# Patient Record
Sex: Female | Born: 1935 | Race: White | Hispanic: No | Smoking: Former smoker
Health system: Southern US, Community
[De-identification: ages and names within clinical notes are randomized; demographics above are authoritative.]

## PROBLEM LIST (undated history)

## (undated) DIAGNOSIS — L0390 Cellulitis, unspecified: Secondary | ICD-10-CM

## (undated) DIAGNOSIS — G245 Blepharospasm: Secondary | ICD-10-CM

## (undated) DIAGNOSIS — M1990 Unspecified osteoarthritis, unspecified site: Secondary | ICD-10-CM

## (undated) DIAGNOSIS — I771 Stricture of artery: Secondary | ICD-10-CM

## (undated) DIAGNOSIS — Z9889 Other specified postprocedural states: Secondary | ICD-10-CM

## (undated) DIAGNOSIS — D649 Anemia, unspecified: Secondary | ICD-10-CM

## (undated) DIAGNOSIS — I6529 Occlusion and stenosis of unspecified carotid artery: Secondary | ICD-10-CM

## (undated) DIAGNOSIS — K219 Gastro-esophageal reflux disease without esophagitis: Secondary | ICD-10-CM

## (undated) DIAGNOSIS — L97909 Non-pressure chronic ulcer of unspecified part of unspecified lower leg with unspecified severity: Secondary | ICD-10-CM

## (undated) DIAGNOSIS — J189 Pneumonia, unspecified organism: Secondary | ICD-10-CM

## (undated) DIAGNOSIS — I639 Cerebral infarction, unspecified: Secondary | ICD-10-CM

## (undated) DIAGNOSIS — F329 Major depressive disorder, single episode, unspecified: Secondary | ICD-10-CM

## (undated) DIAGNOSIS — I83009 Varicose veins of unspecified lower extremity with ulcer of unspecified site: Secondary | ICD-10-CM

## (undated) DIAGNOSIS — I10 Essential (primary) hypertension: Secondary | ICD-10-CM

## (undated) DIAGNOSIS — R112 Nausea with vomiting, unspecified: Secondary | ICD-10-CM

## (undated) DIAGNOSIS — I499 Cardiac arrhythmia, unspecified: Secondary | ICD-10-CM

## (undated) DIAGNOSIS — J9610 Chronic respiratory failure, unspecified whether with hypoxia or hypercapnia: Secondary | ICD-10-CM

## (undated) DIAGNOSIS — E538 Deficiency of other specified B group vitamins: Secondary | ICD-10-CM

## (undated) DIAGNOSIS — J449 Chronic obstructive pulmonary disease, unspecified: Secondary | ICD-10-CM

## (undated) DIAGNOSIS — H8110 Benign paroxysmal vertigo, unspecified ear: Secondary | ICD-10-CM

## (undated) DIAGNOSIS — R413 Other amnesia: Secondary | ICD-10-CM

## (undated) DIAGNOSIS — R51 Headache: Secondary | ICD-10-CM

## (undated) DIAGNOSIS — C801 Malignant (primary) neoplasm, unspecified: Secondary | ICD-10-CM

## (undated) DIAGNOSIS — C44721 Squamous cell carcinoma of skin of unspecified lower limb, including hip: Secondary | ICD-10-CM

## (undated) DIAGNOSIS — R0602 Shortness of breath: Secondary | ICD-10-CM

## (undated) HISTORY — DX: Blepharospasm: G24.5

## (undated) HISTORY — DX: Cellulitis, unspecified: L03.90

## (undated) HISTORY — PX: ABDOMINAL HYSTERECTOMY: SHX81

## (undated) HISTORY — PX: APPENDECTOMY: SHX54

## (undated) HISTORY — DX: Deficiency of other specified B group vitamins: E53.8

## (undated) HISTORY — PX: EYE SURGERY: SHX253

## (undated) HISTORY — PX: JOINT REPLACEMENT: SHX530

## (undated) HISTORY — DX: Non-pressure chronic ulcer of unspecified part of unspecified lower leg with unspecified severity: L97.909

## (undated) HISTORY — DX: Occlusion and stenosis of unspecified carotid artery: I65.29

## (undated) HISTORY — DX: Stricture of artery: I77.1

## (undated) HISTORY — DX: Squamous cell carcinoma of skin of unspecified lower limb, including hip: C44.721

## (undated) HISTORY — DX: Varicose veins of unspecified lower extremity with ulcer of unspecified site: I83.009

## (undated) HISTORY — DX: Headache: R51

## (undated) HISTORY — DX: Other amnesia: R41.3

## (undated) HISTORY — DX: Benign paroxysmal vertigo, unspecified ear: H81.10

## (undated) HISTORY — PX: BREAST SURGERY: SHX581

---

## 1998-01-14 ENCOUNTER — Other Ambulatory Visit: Admission: RE | Admit: 1998-01-14 | Discharge: 1998-01-14 | Payer: Self-pay | Admitting: *Deleted

## 1999-06-10 ENCOUNTER — Encounter: Payer: Self-pay | Admitting: Family Medicine

## 1999-06-10 ENCOUNTER — Encounter: Admission: RE | Admit: 1999-06-10 | Discharge: 1999-06-10 | Payer: Self-pay | Admitting: Family Medicine

## 2000-04-12 ENCOUNTER — Encounter: Payer: Self-pay | Admitting: General Surgery

## 2000-04-19 ENCOUNTER — Encounter (INDEPENDENT_AMBULATORY_CARE_PROVIDER_SITE_OTHER): Payer: Self-pay | Admitting: Specialist

## 2000-04-19 ENCOUNTER — Ambulatory Visit (HOSPITAL_COMMUNITY): Admission: RE | Admit: 2000-04-19 | Discharge: 2000-04-19 | Payer: Self-pay | Admitting: General Surgery

## 2000-04-26 ENCOUNTER — Ambulatory Visit (HOSPITAL_COMMUNITY): Admission: RE | Admit: 2000-04-26 | Discharge: 2000-04-26 | Payer: Self-pay | Admitting: General Surgery

## 2000-04-26 ENCOUNTER — Encounter: Payer: Self-pay | Admitting: General Surgery

## 2000-06-12 ENCOUNTER — Encounter: Admission: RE | Admit: 2000-06-12 | Discharge: 2000-06-12 | Payer: Self-pay | Admitting: Family Medicine

## 2000-06-12 ENCOUNTER — Encounter: Payer: Self-pay | Admitting: Family Medicine

## 2001-06-13 ENCOUNTER — Encounter: Admission: RE | Admit: 2001-06-13 | Discharge: 2001-06-13 | Payer: Self-pay | Admitting: Family Medicine

## 2001-06-13 ENCOUNTER — Encounter: Payer: Self-pay | Admitting: Family Medicine

## 2002-06-18 ENCOUNTER — Encounter: Admission: RE | Admit: 2002-06-18 | Discharge: 2002-06-18 | Payer: Self-pay | Admitting: Family Medicine

## 2002-06-18 ENCOUNTER — Encounter: Payer: Self-pay | Admitting: Family Medicine

## 2003-06-23 ENCOUNTER — Encounter: Admission: RE | Admit: 2003-06-23 | Discharge: 2003-06-23 | Payer: Self-pay | Admitting: Family Medicine

## 2004-06-23 ENCOUNTER — Encounter: Admission: RE | Admit: 2004-06-23 | Discharge: 2004-06-23 | Payer: Self-pay | Admitting: Family Medicine

## 2005-04-22 ENCOUNTER — Encounter: Admission: RE | Admit: 2005-04-22 | Discharge: 2005-04-22 | Payer: Self-pay | Admitting: Family Medicine

## 2005-07-22 ENCOUNTER — Encounter: Admission: RE | Admit: 2005-07-22 | Discharge: 2005-07-22 | Payer: Self-pay | Admitting: Family Medicine

## 2005-12-30 ENCOUNTER — Encounter: Admission: RE | Admit: 2005-12-30 | Discharge: 2005-12-30 | Payer: Self-pay | Admitting: Family Medicine

## 2006-07-14 ENCOUNTER — Encounter: Payer: Self-pay | Admitting: Emergency Medicine

## 2006-07-15 ENCOUNTER — Inpatient Hospital Stay (HOSPITAL_COMMUNITY): Admission: EM | Admit: 2006-07-15 | Discharge: 2006-07-16 | Payer: Self-pay | Admitting: Neurology

## 2006-07-24 ENCOUNTER — Encounter: Admission: RE | Admit: 2006-07-24 | Discharge: 2006-07-24 | Payer: Self-pay | Admitting: Family Medicine

## 2007-03-04 ENCOUNTER — Emergency Department (HOSPITAL_COMMUNITY): Admission: EM | Admit: 2007-03-04 | Discharge: 2007-03-04 | Payer: Self-pay | Admitting: Emergency Medicine

## 2007-03-08 ENCOUNTER — Ambulatory Visit (HOSPITAL_BASED_OUTPATIENT_CLINIC_OR_DEPARTMENT_OTHER): Admission: RE | Admit: 2007-03-08 | Discharge: 2007-03-09 | Payer: Self-pay | Admitting: *Deleted

## 2007-03-26 ENCOUNTER — Encounter: Admission: RE | Admit: 2007-03-26 | Discharge: 2007-03-26 | Payer: Self-pay | Admitting: Family Medicine

## 2007-04-09 ENCOUNTER — Encounter: Admission: RE | Admit: 2007-04-09 | Discharge: 2007-05-11 | Payer: Self-pay | Admitting: Family Medicine

## 2007-04-13 ENCOUNTER — Encounter: Admission: RE | Admit: 2007-04-13 | Discharge: 2007-04-13 | Payer: Self-pay | Admitting: Family Medicine

## 2007-07-26 ENCOUNTER — Encounter: Admission: RE | Admit: 2007-07-26 | Discharge: 2007-07-26 | Payer: Self-pay | Admitting: Family Medicine

## 2007-08-16 DIAGNOSIS — J189 Pneumonia, unspecified organism: Secondary | ICD-10-CM

## 2007-08-16 HISTORY — DX: Pneumonia, unspecified organism: J18.9

## 2008-02-22 ENCOUNTER — Ambulatory Visit: Payer: Self-pay | Admitting: Internal Medicine

## 2008-07-29 ENCOUNTER — Encounter: Admission: RE | Admit: 2008-07-29 | Discharge: 2008-07-29 | Payer: Self-pay | Admitting: Family Medicine

## 2008-08-15 DIAGNOSIS — I639 Cerebral infarction, unspecified: Secondary | ICD-10-CM

## 2008-08-15 HISTORY — DX: Cerebral infarction, unspecified: I63.9

## 2008-09-07 ENCOUNTER — Encounter: Admission: RE | Admit: 2008-09-07 | Discharge: 2008-09-07 | Payer: Self-pay | Admitting: Neurology

## 2008-11-19 ENCOUNTER — Emergency Department (HOSPITAL_COMMUNITY): Admission: EM | Admit: 2008-11-19 | Discharge: 2008-11-19 | Payer: Self-pay | Admitting: Emergency Medicine

## 2009-05-04 ENCOUNTER — Emergency Department (HOSPITAL_COMMUNITY): Admission: EM | Admit: 2009-05-04 | Discharge: 2009-05-04 | Payer: Self-pay | Admitting: Family Medicine

## 2009-08-13 ENCOUNTER — Encounter: Admission: RE | Admit: 2009-08-13 | Discharge: 2009-08-13 | Payer: Self-pay | Admitting: Family Medicine

## 2010-08-15 DIAGNOSIS — L97909 Non-pressure chronic ulcer of unspecified part of unspecified lower leg with unspecified severity: Secondary | ICD-10-CM

## 2010-08-15 DIAGNOSIS — I83009 Varicose veins of unspecified lower extremity with ulcer of unspecified site: Secondary | ICD-10-CM

## 2010-08-15 HISTORY — DX: Non-pressure chronic ulcer of unspecified part of unspecified lower leg with unspecified severity: L97.909

## 2010-08-15 HISTORY — DX: Varicose veins of unspecified lower extremity with ulcer of unspecified site: I83.009

## 2010-08-25 ENCOUNTER — Encounter
Admission: RE | Admit: 2010-08-25 | Discharge: 2010-08-25 | Payer: Self-pay | Source: Home / Self Care | Attending: Family Medicine | Admitting: Family Medicine

## 2010-09-02 ENCOUNTER — Encounter
Admission: RE | Admit: 2010-09-02 | Discharge: 2010-09-02 | Payer: Self-pay | Source: Home / Self Care | Attending: Family Medicine | Admitting: Family Medicine

## 2010-09-08 ENCOUNTER — Encounter
Admission: RE | Admit: 2010-09-08 | Discharge: 2010-09-14 | Payer: Self-pay | Source: Home / Self Care | Attending: Family Medicine | Admitting: Family Medicine

## 2010-09-15 ENCOUNTER — Ambulatory Visit: Payer: MEDICARE

## 2010-09-30 ENCOUNTER — Emergency Department (HOSPITAL_COMMUNITY)
Admission: EM | Admit: 2010-09-30 | Discharge: 2010-09-30 | Disposition: A | Discharge status: Home / Self Care | Payer: MEDICARE | Attending: Emergency Medicine | Admitting: Emergency Medicine

## 2010-09-30 DIAGNOSIS — R110 Nausea: Secondary | ICD-10-CM | POA: Insufficient documentation

## 2010-09-30 DIAGNOSIS — J4489 Other specified chronic obstructive pulmonary disease: Secondary | ICD-10-CM | POA: Insufficient documentation

## 2010-09-30 DIAGNOSIS — R079 Chest pain, unspecified: Secondary | ICD-10-CM | POA: Insufficient documentation

## 2010-09-30 DIAGNOSIS — R002 Palpitations: Secondary | ICD-10-CM | POA: Insufficient documentation

## 2010-09-30 DIAGNOSIS — I10 Essential (primary) hypertension: Secondary | ICD-10-CM | POA: Insufficient documentation

## 2010-09-30 DIAGNOSIS — R062 Wheezing: Secondary | ICD-10-CM | POA: Insufficient documentation

## 2010-09-30 DIAGNOSIS — Z8673 Personal history of transient ischemic attack (TIA), and cerebral infarction without residual deficits: Secondary | ICD-10-CM | POA: Insufficient documentation

## 2010-09-30 DIAGNOSIS — R42 Dizziness and giddiness: Secondary | ICD-10-CM | POA: Insufficient documentation

## 2010-09-30 DIAGNOSIS — Z853 Personal history of malignant neoplasm of breast: Secondary | ICD-10-CM | POA: Insufficient documentation

## 2010-09-30 DIAGNOSIS — J449 Chronic obstructive pulmonary disease, unspecified: Secondary | ICD-10-CM | POA: Insufficient documentation

## 2010-09-30 LAB — POCT I-STAT, CHEM 8
Chloride: 98 mEq/L (ref 96–112)
Creatinine, Ser: 0.9 mg/dL (ref 0.4–1.2)
Glucose, Bld: 118 mg/dL — ABNORMAL HIGH (ref 70–99)
Hemoglobin: 11.9 g/dL — ABNORMAL LOW (ref 12.0–15.0)
Potassium: 3.9 mEq/L (ref 3.5–5.1)
Sodium: 131 mEq/L — ABNORMAL LOW (ref 135–145)

## 2010-11-07 ENCOUNTER — Emergency Department (HOSPITAL_COMMUNITY)
Admission: EM | Admit: 2010-11-07 | Discharge: 2010-11-07 | Disposition: A | Discharge status: Home / Self Care | Payer: MEDICARE | Attending: Emergency Medicine | Admitting: Emergency Medicine

## 2010-11-07 ENCOUNTER — Emergency Department (HOSPITAL_COMMUNITY): Payer: MEDICARE

## 2010-11-07 DIAGNOSIS — W010XXA Fall on same level from slipping, tripping and stumbling without subsequent striking against object, initial encounter: Secondary | ICD-10-CM | POA: Insufficient documentation

## 2010-11-07 DIAGNOSIS — R229 Localized swelling, mass and lump, unspecified: Secondary | ICD-10-CM | POA: Insufficient documentation

## 2010-11-07 DIAGNOSIS — S52599A Other fractures of lower end of unspecified radius, initial encounter for closed fracture: Secondary | ICD-10-CM | POA: Insufficient documentation

## 2010-11-07 DIAGNOSIS — Y9229 Other specified public building as the place of occurrence of the external cause: Secondary | ICD-10-CM | POA: Insufficient documentation

## 2010-12-28 NOTE — Op Note (Signed)
Tonya Mckee, Tonya Mckee               ACCOUNT NO.:  [id number]   MEDICAL RECORD NO.:  [id number]          PATIENT TYPE:  AMB   LOCATION:  DSC                          FACILITY:  [facility]   PHYSICIAN:  Tennis Must Meyerdierks, M.D.DATE OF BIRTH:  [birth year]   DATE OF PROCEDURE:  03/08/2007  DATE OF DISCHARGE:                               OPERATIVE REPORT   PREOPERATIVE DIAGNOSIS:  Comminuted intra-articular fracture left distal  radius.   POSTOPERATIVE DIAGNOSIS:  Comminuted intra-articular fracture left  distal radius.   PROCEDURE:  Open reduction internal fixation left distal radius  fracture.   SURGEON:  Lowell Bouton, M.D.   ANESTHESIA:  General.   OPERATIVE FINDINGS:  The patient had a comminuted fracture of the distal  radius with an ulnar fragment.  There was significant dorsal tilt.  The  bone was of moderately good quality.   PROCEDURE:  Under general anesthesia with a tourniquet on the left arm,  the left hand was prepped and draped in usual fashion after explaining  the limb, the tourniquet was inflated to 250 mmHg.  The index and long  fingers were suspended from finger traps and 10 pounds of traction was  applied across the end of the table.  A longitudinal incision was made  over the volar aspect of the forearm and wrist in line with the FCR  tendon.  Blunt dissection was carried through the subcutaneous tissues  and bleeding points were coagulated.  Blunt dissection was carried  through the FCR fascia and the tendon was retracted ulnarly.  The floor  of the tendon sheath was then incised longitudinally and blunt  dissection was carried down to the pronator quadratus.  This was  released from its radial attachments sharply.  The pronator quadratus  was then elevated off the bone with a Therapist, nutritional.  The fracture site  was identified and a Therapist, nutritional was used to reduce the fracture.  Some of the traction was removed and x-rays showed good position of  the  fracture.  A DVR plate was then used with a standard size plate and was  centered on the bone with a 3.5-mm screw in the sliding hole.  The 2.0-  mm pegs were then inserted in the four distal holes of the traditional  plate.  X-rays showed good position of fracture.  The remaining 3.5-mm  screws were then inserted and the distal two pegs were left free.  The  fracture appeared to be stable.  The pegs were not in the joint.  The  wound was then irrigated copiously with saline.  TLS drain was inserted.  Pronator quadratus was repaired with 4-0 Vicryl, subcutaneous tissue was  closed with 4-0 Vicryl.  The skin was closed with a 3-0 subcuticular  Prolene.  Steri-Strips were applied and half percent Marcaine was placed  in the skin edges for pain control.  The patient was placed in a volar  wrist splint.  She tolerated the procedure well and went to recovery  room awake and stable in good condition.      Lowell Bouton, M.D.  Electronically  Signed     EMM/MEDQ  D:  03/08/2007  T:  03/09/2007  Job:  [id number]

## 2010-12-31 NOTE — Discharge Summary (Signed)
Tonya, Mckee               ACCOUNT NO.:  [id number]   MEDICAL RECORD NO.:  [id number]          PATIENT TYPE:  INP   LOCATION:  3705                         FACILITY:  [facility]   PHYSICIAN:  Marlan Palau, M.D.  DATE OF BIRTH:  June 30, 1929   DATE OF ADMISSION:  07/15/2006  DATE OF DISCHARGE:  07/16/2006                               DISCHARGE SUMMARY   ADMISSION DIAGNOSES:  1. Onset of headache, nausea, vomiting.  2. Chronic daily headaches.  3. Hypertension.  4. History of breast cancer.  5. Tobacco abuse.   DISCHARGE DIAGNOSES:  1. History of severe headache with nausea, vomiting.  2. Chronic daily headaches.  3. Hypertension.  4. History of breast cancer.   PROCEDURES DONE THIS ADMISSION:  1. CT of the head.  2. Lumbar puncture.  3. MRI of the brain.  4. MRI angiogram of the intracranial and extracranial vessels.   COMPLICATIONS OF THE ABOVE PROCEDURES:  None.   HISTORY OF PRESENT ILLNESS:  Tonya Mckee is a 77 year old right-handed  white female born [birth year] with a history of hypertension, obesity,  tobacco abuse.  The patient came to [facility] on July 14, 2006 with onset of a headache that began that evening.  The patient had  been in a movie, began developing bitemporal headaches, throbbing in  nature, unassociated with neck stiffness.  The patient denied any visual  field disturbance, numbness, weakness on the face, arms or legs.  The  patient did have associated nausea and vomiting that was recurrent,  associated with the headache.  The patient came to the emergency room  for further evaluation.  A lumbar puncture was performed showing  traumatic tap, but no evidence of subarachnoid hemorrhage.  No evidence  of meningitis.  The patient had a CT scan of the head that questioned a  subacute infarct in the left caudate head.  The patient was admitted for  further evaluation.   PAST MEDICAL HISTORY:  1. History of severe headache.  2.  Chronic daily headaches.  3. Small vessel disease by CT; questionable left caudate head      infarction.  4. Hypertension.  5. Obesity.  6. Breast cancer.  7. Hysterectomy.  8. Tobacco abuse.  9. Bilateral cataract surgery.  10.Appendectomy.  11.Left hemifacial spasm.   MEDICATIONS ON ADMISSION:  1. Toprol XL 25 mg daily.  2. Clarinex tablets.  3. Neurontin 100 mg daily.  4. Prozac 10 mg a day.  5. The patient is also on a blood pressure medication of which she      cannot recall the name.   ALLERGIES:  SULFA DRUGS.   SOCIAL HISTORY/FAMILY HISTORY/REVIEW OF SYSTEMS/PHYSICAL EXAMINATION:  Smokes a pack and a half of cigarettes daily.  Does not drink alcohol.  Please refer to history and physical dictation summary for social  history, family history, review of systems, physical examination.   LABORATORY VALUES:  Notable for a white count of 14.4, hemoglobin of  14.2, hematocrit of 41.4, MCV of 88.1, platelets of 354.  Sodium is 135,  potassium 3.9, chloride of 100, CO2  of 30, glucose of 109, BUN of 15,  creatinine 0.8, total bili of 0.7.  The patient has a alkaline  phosphatase of 68, SGOT of 25, SGPT of 17, total protein of 6.8, albumin  of 4.0, calcium of 9.3.  Urinalysis reveals specific gravity of 0.017,  pH of 7.5.  Spinal fluid analysis reveals 1 white cell, 230 red cells in  tube 4, protein of 75, glucose of 63.   ELECTROCARDIOGRAM:  EKG reveals sinus bradycardia with a rate at 53, ST  abnormality, possible digitalis effect.   CHEST X-RAY:  A chest x-ray reveals stable chronic lung disease with no  acute findings.   HOSPITAL COURSE:  This patient has come in with significant headache  associated with nausea and vomiting.  The patient has had treatment for  the headache that includes IV morphine and Reglan for nausea.  The  patient was set up for an MRI scan of the brain that was done showing no  evidence of an acute cerebrovascular infarct.  Evidence of atrophy  and  chronic small vessel ischemic changes were noted.  MRI angiogram of the  intracranial and extracranial vessels were unremarkable.  The patient  was placed on aspirin 81 mg daily. Had been treated with Altace 2.5 mg a  day for blood pressure.  Blood pressures initially were elevated in the  180 systolic range, but currently blood pressures are under good control  with blood pressure of 131/61 at the time of discharge.  A CBC has been  repeated, but the results are pending at the time of this dictation.  The patient had slight elevation in white blood count on admission.   DISCHARGE MEDICATIONS:  At this time, the patient will be discharged to  home on:  1. Toprol XL 25 mg one a day.  2. Neurontin 100 mg daily.  3. Prozac 10 mg day.  4. The patient will return to her home dose of blood pressure      medication, and is on aspirin 81 mg a day.   FOLLOWUP:  The patient already has a revisit for her mid December with  Dr. Sandria Manly and will keep this appointment.  Patient is followed for left  hemifacial spasm.   CONDITION ON DISCHARGE:  At the time of discharge, the patient is  bright, alert, cooperative, complains of minimal headache at this point,  is eating and drinking well, has no deficits with sensation or strength  or balance.  The patient was told to stop smoking at this point.      Marlan Palau, M.D.  Electronically Signed     CKW/MEDQ  D:  07/16/2006  T:  07/17/2006  Job:  [id number]   cc:   Otilio Connors. Gerri Spore, M.D.  [facility]

## 2010-12-31 NOTE — H&P (Signed)
[name], Mckee NO.:  [id number]   MEDICAL RECORD NO.:  [id number]          PATIENT TYPE:  EMS   LOCATION:  ED                           FACILITY:  [facility]   PHYSICIAN:  Marlan Palau, M.D.  DATE OF BIRTH:  [birth year]   DATE OF ADMISSION:  07/14/2006  DATE OF DISCHARGE:                              HISTORY & PHYSICAL   NEUROLOGY ADMISSION NOTE   HISTORY OF PRESENT ILLNESS:  Tonya Mckee is a 77 year old right  handed white female born 06-Nov-1928 with a history of hypertension,  tobacco abuse.  This patient comes to the [facility] emergency room  this evening after onset of a headache on the evening of July 14, 2006 while watching a movie.  This patient noted onset of this headache  fairly rapidly, the bi-temporal regions, unassociated with neck  stiffness.  Patient had nausea, vomiting associated with the headache.  Patient does report chronic daily headaches for the last 30 years, but  claims that this headache is different.  Patient reports no numbness or  weakness on the face, arms, legs, other than some tingling in the  fingers with the nausea, vomiting.  Patient denies any visual field  disturbance, again neck stiffness.  Patient has undergone CT scan of the  brain, questions the sub acute left caudate head infarcts, some chronic  left frontal white matter changes also seen.  Lumbar puncture was  performed, was traumatic in nature, but showed no clear evidence of  subarachnoid hemorrhage or meningitis.  Neurology is asked to see this  patient for further evaluation.  White blood count is elevated at 14.   PAST MEDICAL HISTORY:  Is notable for:  1. Headache as above.  2. Chronic daily headaches, the patient attributes to sinus problems.  3. Small vessel disease by CT scan, questionable sub acute infarct as      above.  4. Hypertension.  5. Obesity.  6. Breast cancer in the past.  7. Hysterectomy.  8. Tobacco abuse.  9. Bilateral  cataract surgery.  10.Appendectomy.  11.Left hemifacial spasm followed by Dr. Sandria Manly.   MEDICATIONS:  At this time include:  1. Toprol XL 25 mg daily.  2. Clarinex daily.  3. Neurontin unknown dose 1 tablet at midday.  4. Prozac 10 mg a day.  5. And another daily blood pressure pill that she cannot remember the      name.   ALLERGIES:  Patient has allergy to SULFA DRUGS.   SOCIAL HISTORY:  Smokes a pack and a half of cigarettes daily.  Does not  drink alcohol.  This patient is widowed[place] is no longer working, is retired.  Patient has 4  children, 1 daughter has breast cancer and thyroid disease.   FAMILY MEDICAL HISTORY:  Notable that the patient was adopted.  Family  medical history is unknown.   REVIEW OF SYSTEMS:  Notable for no recent fevers, chills.  The patient  does note bi frontotemporal headache.  Denies trouble swallowing,  choking, neck pain, neck stiffness.  Denies  shortness of breath, chest  pains, abdominal pains.  Does note some nausea.  Denies problems  controlling the bowels or bladder.  Denies any blackout episodes.   PHYSICAL EXAMINATION:  Vitals:  Blood pressure is 148/89, heart rate 90,  respiratory rate 20, temperature afebrile.  GENERAL:  This patient is minimally to moderately obese white female who  is alert, cooperative at the time of examination.  HEENT EXAMINATION:  Head is atraumatic.  Eyes:  Pupils are equal, round,  react to light.  Post surgical discs flat.  NECK:  Supple, no carotid bruits noted.  RESPIRATORY EXAMINATION:  Is clear.  CARDIOVASCULAR EXAMINATION:  Reveals regular rate and rhythm, no obvious  murmurs or rubs noted.  EXTREMITIES:  Without significant edema.  Trace edema is noted  bilaterally, however.  NEUROLOGIC EXAMINATION:  Cranial nerves as above.  Facial symmetry is  present.  Patient has good sensation in face to pinprick, soft touch  bilaterally.  Has good strength in facial  muscles.  Muscles turn the  head and shoulder shrug bilaterally.  Speech is well enunciated, non  aphasiac.  Intermittent twitching of the left face is noted.  Patient  has good strength in all 4 extremities, good symmetric bulk and tone is  noted throughout.  Sensory testing is intact to pinprick, soft touch,  and vibratory sensation throughout.  Patient has good finger to nose,  finger toe to finger bilaterally.  Gait was not tested.  Deep 10  reflexes depressed but symmetric.  Toes neutral bilaterally.  No drift  is seen in the upper extremities.   LABORATORY VALUES:  Notable for white count of 14.4, hemoglobin of 14.2,  hematocrit of 41.4, MCV of 81.1, and the platelets of 354.  A urinalysis  was specific gravity 1.017, pH 7.5, sodium 135, potassium 3.9, chloride  of 100, CO2 30, glucose of 109, BUN of 15, creatinine 0.8, calcium 9.3,  total protein 6.8, albumin 4.0, AST of 25, ALT of 17, glucose in the  spinal fluid 63, protein is elevated at 75, tube 1 had 1800 red cells, 7  white cells, tube #4 had 230 red cells, 1 white cell.  Again, urinalysis  reveals a specific gravity 1.017, pH of 7.5, otherwise unremarkable.   IMPRESSION:  1. New onset headache bi temporal in nature.  2. Chronic daily headaches.  3. Tobacco abuse.  4. Hypertension.  5. Small vessel disease by CT.   This patient has a non focal examination at this point.  Patient has  sustained a unusual headache for her with severe bi temporal pain, no  scalp tenderness, nausea, vomiting.  Patient does have a slightly  elevated white count.  No evidence of meningitis or definite  subarachnoid hemorrhage seen.  LP was slightly traumatic in nature.  Patient will be admitted for further evaluation.  Need to rule out an  acute or sub acute cerebrovascular infarct as questioned by the  radiologist in the left caudate head.   PLAN:  1. Transfer to [facility].  2. MRI of the brain. 3. MRI angiogram of the  intracranial, extracranial vessels.  Rule out      carotid dissection.  4. Analgesics for pain, IV fluids.  Will follow patient's clinical      course while in house.      Marlan Palau, M.D.  Electronically Signed     CKW/MEDQ  D:  07/15/2006  T:  07/15/2006  Job:  [id number]   cc:   Otilio Connors.  Gerri Spore, M.D.  [facility]

## 2010-12-31 NOTE — Op Note (Signed)
[facility]  Patient:    Tonya Mckee, Tonya Mckee                 MRN: [id number] Proc. Date: 04/19/00 Adm. Date:  [id number] Attending:  Chevis Pretty S                           Operative Report  PREOPERATIVE DIAGNOSIS:  Lesion on the right anterior leg.  POSTOPERATIVE DIAGNOSIS:  Lesion on the right anterior leg.  PROCEDURE:  Excision of mass lesion on the right leg.  SURGEON:  Chevis Pretty, M.D.  ANESTHESIA:  Spinal.  DESCRIPTION OF PROCEDURE:  After informed consent was obtained, the patient was brought to the operating room and placed in the supine position on the operating table.  After adequate induction of spinal anesthesia, the patients right leg was prepped circumferentially from the mid thigh to the ankle, draped in the usual sterile manner.  A vertical ________ elliptical incision was made to encompass the mass lesion.  This incision was made with the 15 blade and carried down through the skin and subcutaneous tissues.  The mass lesion was then grasped by the corner and sharply removed by dissecting the subcutaneous tissue with the 15 blade knife.  Once this was complete, the specimen was oriented with the long suture being in the lateral position and the short suture being in the superior position. It was then sent for permanent evaluation pathology.  All bleeding points within the incision were coagulated with Bovie electrocautery except for one small vein that required clamping with a hemostat and then ligating with a 3-0 silk tie.  Once this was complete, the wound was found to be hemostatic.  The wound was then closed with several simple vertical mattress stitches.  The wound was under some tension, but the skin edges were able to be reapproximated.  The middle portion was then reinforced with several simple interrupted stitches to help approximate the edges of the skin.  Neosporin and sterile dressings were applied.  The patient  tolerated the procedure well.  At the end of the case, all needle, sponge, and instrument counts were correct.  The patient was taken to the recovery room in stable condition. DD:  04/19/00 TD:  04/20/00 Job: [id number]

## 2011-01-10 ENCOUNTER — Emergency Department (HOSPITAL_COMMUNITY)
Admission: EM | Admit: 2011-01-10 | Discharge: 2011-01-10 | Disposition: A | Discharge status: Home / Self Care | Payer: Medicare Other | Attending: Emergency Medicine | Admitting: Emergency Medicine

## 2011-01-10 DIAGNOSIS — Z853 Personal history of malignant neoplasm of breast: Secondary | ICD-10-CM | POA: Insufficient documentation

## 2011-01-10 DIAGNOSIS — M79609 Pain in unspecified limb: Secondary | ICD-10-CM | POA: Insufficient documentation

## 2011-01-10 DIAGNOSIS — I10 Essential (primary) hypertension: Secondary | ICD-10-CM | POA: Insufficient documentation

## 2011-01-10 DIAGNOSIS — R509 Fever, unspecified: Secondary | ICD-10-CM | POA: Insufficient documentation

## 2011-01-10 DIAGNOSIS — L02419 Cutaneous abscess of limb, unspecified: Secondary | ICD-10-CM | POA: Insufficient documentation

## 2011-01-10 DIAGNOSIS — L03119 Cellulitis of unspecified part of limb: Secondary | ICD-10-CM | POA: Insufficient documentation

## 2011-01-10 DIAGNOSIS — Z8673 Personal history of transient ischemic attack (TIA), and cerebral infarction without residual deficits: Secondary | ICD-10-CM | POA: Insufficient documentation

## 2011-03-29 ENCOUNTER — Ambulatory Visit
Admission: RE | Admit: 2011-03-29 | Discharge: 2011-03-29 | Disposition: A | Discharge status: Home / Self Care | Payer: Medicare Other | Source: Ambulatory Visit | Attending: Family Medicine | Admitting: Family Medicine

## 2011-03-29 ENCOUNTER — Other Ambulatory Visit: Payer: Self-pay | Admitting: Family Medicine

## 2011-03-29 DIAGNOSIS — R634 Abnormal weight loss: Secondary | ICD-10-CM

## 2011-05-30 LAB — I-STAT 8, (EC8 V) (CONVERTED LAB)
Acid-Base Excess: 6 — ABNORMAL HIGH
BUN: 9
Chloride: 102
Potassium: 5.1
pCO2, Ven: 55.1 — ABNORMAL HIGH
pH, Ven: 7.39 — ABNORMAL HIGH

## 2011-05-30 LAB — POCT HEMOGLOBIN-HEMACUE: Hemoglobin: 13.4

## 2011-07-11 ENCOUNTER — Encounter (HOSPITAL_BASED_OUTPATIENT_CLINIC_OR_DEPARTMENT_OTHER): Payer: Medicare Other

## 2011-08-08 ENCOUNTER — Encounter (HOSPITAL_BASED_OUTPATIENT_CLINIC_OR_DEPARTMENT_OTHER): Payer: Medicare Other

## 2011-08-18 ENCOUNTER — Other Ambulatory Visit: Payer: Self-pay | Admitting: Family Medicine

## 2011-08-18 DIAGNOSIS — Z1231 Encounter for screening mammogram for malignant neoplasm of breast: Secondary | ICD-10-CM

## 2011-09-21 ENCOUNTER — Ambulatory Visit
Admission: RE | Admit: 2011-09-21 | Discharge: 2011-09-21 | Disposition: A | Discharge status: Home / Self Care | Payer: Medicare Other | Source: Ambulatory Visit | Attending: Family Medicine | Admitting: Family Medicine

## 2011-09-21 DIAGNOSIS — Z1231 Encounter for screening mammogram for malignant neoplasm of breast: Secondary | ICD-10-CM

## 2012-01-01 ENCOUNTER — Inpatient Hospital Stay (HOSPITAL_COMMUNITY)
Admission: EM | Admit: 2012-01-01 | Discharge: 2012-01-03 | DRG: 603 | Disposition: A | Discharge status: Home / Self Care | Payer: Medicare Other | Attending: Internal Medicine | Admitting: Internal Medicine

## 2012-01-01 ENCOUNTER — Encounter (HOSPITAL_COMMUNITY): Payer: Self-pay

## 2012-01-01 DIAGNOSIS — F17200 Nicotine dependence, unspecified, uncomplicated: Secondary | ICD-10-CM | POA: Diagnosis present

## 2012-01-01 DIAGNOSIS — L0390 Cellulitis, unspecified: Secondary | ICD-10-CM

## 2012-01-01 DIAGNOSIS — Z880 Allergy status to penicillin: Secondary | ICD-10-CM

## 2012-01-01 DIAGNOSIS — E871 Hypo-osmolality and hyponatremia: Secondary | ICD-10-CM | POA: Diagnosis present

## 2012-01-01 DIAGNOSIS — IMO0001 Reserved for inherently not codable concepts without codable children: Secondary | ICD-10-CM | POA: Diagnosis present

## 2012-01-01 DIAGNOSIS — L02519 Cutaneous abscess of unspecified hand: Principal | ICD-10-CM | POA: Diagnosis present

## 2012-01-01 DIAGNOSIS — Z853 Personal history of malignant neoplasm of breast: Secondary | ICD-10-CM

## 2012-01-01 DIAGNOSIS — J4489 Other specified chronic obstructive pulmonary disease: Secondary | ICD-10-CM | POA: Diagnosis present

## 2012-01-01 DIAGNOSIS — S61459A Open bite of unspecified hand, initial encounter: Secondary | ICD-10-CM | POA: Diagnosis present

## 2012-01-01 DIAGNOSIS — F329 Major depressive disorder, single episode, unspecified: Secondary | ICD-10-CM | POA: Diagnosis present

## 2012-01-01 DIAGNOSIS — I10 Essential (primary) hypertension: Secondary | ICD-10-CM

## 2012-01-01 DIAGNOSIS — L03119 Cellulitis of unspecified part of limb: Principal | ICD-10-CM | POA: Diagnosis present

## 2012-01-01 DIAGNOSIS — D649 Anemia, unspecified: Secondary | ICD-10-CM | POA: Diagnosis present

## 2012-01-01 DIAGNOSIS — D72829 Elevated white blood cell count, unspecified: Secondary | ICD-10-CM | POA: Diagnosis present

## 2012-01-01 DIAGNOSIS — F3289 Other specified depressive episodes: Secondary | ICD-10-CM | POA: Diagnosis present

## 2012-01-01 DIAGNOSIS — J449 Chronic obstructive pulmonary disease, unspecified: Secondary | ICD-10-CM | POA: Diagnosis present

## 2012-01-01 DIAGNOSIS — D46C Myelodysplastic syndrome with isolated del(5q) chromosomal abnormality: Secondary | ICD-10-CM | POA: Diagnosis present

## 2012-01-01 DIAGNOSIS — F32A Depression, unspecified: Secondary | ICD-10-CM | POA: Diagnosis present

## 2012-01-01 HISTORY — DX: Unspecified osteoarthritis, unspecified site: M19.90

## 2012-01-01 HISTORY — DX: Gastro-esophageal reflux disease without esophagitis: K21.9

## 2012-01-01 HISTORY — DX: Cellulitis, unspecified: L03.90

## 2012-01-01 HISTORY — DX: Essential (primary) hypertension: I10

## 2012-01-01 HISTORY — DX: Cardiac arrhythmia, unspecified: I49.9

## 2012-01-01 HISTORY — DX: Malignant (primary) neoplasm, unspecified: C80.1

## 2012-01-01 HISTORY — DX: Other specified postprocedural states: R11.2

## 2012-01-01 HISTORY — DX: Major depressive disorder, single episode, unspecified: F32.9

## 2012-01-01 HISTORY — DX: Cerebral infarction, unspecified: I63.9

## 2012-01-01 HISTORY — DX: Chronic obstructive pulmonary disease, unspecified: J44.9

## 2012-01-01 HISTORY — DX: Pneumonia, unspecified organism: J18.9

## 2012-01-01 HISTORY — DX: Shortness of breath: R06.02

## 2012-01-01 HISTORY — DX: Other specified postprocedural states: Z98.890

## 2012-01-01 HISTORY — DX: Anemia, unspecified: D64.9

## 2012-01-01 LAB — BASIC METABOLIC PANEL
BUN: 13 mg/dL (ref 6–23)
Calcium: 8.6 mg/dL (ref 8.4–10.5)
Creatinine, Ser: 0.64 mg/dL (ref 0.50–1.10)
GFR calc Af Amer: >90 mL/min (ref >90)
GFR calc non Af Amer: 81 mL/min — ABNORMAL LOW (ref >90)

## 2012-01-01 LAB — CBC
HCT: 32.4 % — ABNORMAL LOW (ref 36.0–46.0)
MCH: 31.4 pg (ref 26.0–34.0)
MCHC: 34 g/dL (ref 30.0–36.0)
MCV: 92.6 fL (ref 78.0–100.0)
RDW: 14.7 % (ref 11.5–15.5)

## 2012-01-01 MED ORDER — IPRATROPIUM-ALBUTEROL 18-103 MCG/ACT IN AERO
2.0000 | INHALATION_SPRAY | Freq: Four times a day (QID) | RESPIRATORY_TRACT | Status: DC
  Filled 2012-01-01: qty 14.7

## 2012-01-01 MED ORDER — MORPHINE SULFATE 4 MG/ML IJ SOLN
4.0000 mg | Freq: Once | INTRAMUSCULAR | Status: AC
  Administered 2012-01-01: 4 mg via INTRAVENOUS
  Filled 2012-01-01: qty 1

## 2012-01-01 MED ORDER — LISINOPRIL 10 MG PO TABS
10.0000 mg | ORAL_TABLET | Freq: Every day | ORAL | Status: DC
  Administered 2012-01-02 – 2012-01-03 (×2): 10 mg via ORAL
  Filled 2012-01-01 (×2): qty 1

## 2012-01-01 MED ORDER — HYDROCODONE-ACETAMINOPHEN 5-325 MG PO TABS
1.0000 | ORAL_TABLET | ORAL | Status: DC | PRN
  Administered 2012-01-02: 1 via ORAL
  Administered 2012-01-02: 2 via ORAL
  Administered 2012-01-03 (×2): 1 via ORAL
  Filled 2012-01-01 (×2): qty 1
  Filled 2012-01-01: qty 2
  Filled 2012-01-01: qty 1

## 2012-01-01 MED ORDER — IPRATROPIUM-ALBUTEROL 20-100 MCG/ACT IN AERS
1.0000 | INHALATION_SPRAY | Freq: Four times a day (QID) | RESPIRATORY_TRACT | Status: DC
  Administered 2012-01-02 (×3): 1 via RESPIRATORY_TRACT
  Filled 2012-01-01: qty 4

## 2012-01-01 MED ORDER — METOPROLOL SUCCINATE ER 50 MG PO TB24
50.0000 mg | ORAL_TABLET | Freq: Every day | ORAL | Status: DC
  Administered 2012-01-02 – 2012-01-03 (×2): 50 mg via ORAL
  Filled 2012-01-01 (×2): qty 1

## 2012-01-01 MED ORDER — ONDANSETRON HCL 4 MG/2ML IJ SOLN
4.0000 mg | Freq: Four times a day (QID) | INTRAMUSCULAR | Status: DC
  Administered 2012-01-02 – 2012-01-03 (×5): 4 mg via INTRAVENOUS
  Filled 2012-01-01 (×6): qty 2

## 2012-01-01 MED ORDER — CLINDAMYCIN PHOSPHATE 600 MG/50ML IV SOLN
600.0000 mg | Freq: Once | INTRAVENOUS | Status: AC
  Administered 2012-01-01: 600 mg via INTRAVENOUS
  Filled 2012-01-01: qty 50

## 2012-01-01 MED ORDER — HYDROCHLOROTHIAZIDE 12.5 MG PO CAPS
12.5000 mg | ORAL_CAPSULE | Freq: Every day | ORAL | Status: DC
  Administered 2012-01-02: 12.5 mg via ORAL
  Filled 2012-01-01 (×2): qty 1

## 2012-01-01 MED ORDER — ONDANSETRON HCL 4 MG/2ML IJ SOLN
4.0000 mg | Freq: Once | INTRAMUSCULAR | Status: AC
  Administered 2012-01-01: 4 mg via INTRAVENOUS
  Filled 2012-01-01: qty 2

## 2012-01-01 MED ORDER — HEPARIN SODIUM (PORCINE) 5000 UNIT/ML IJ SOLN
5000.0000 [IU] | Freq: Three times a day (TID) | INTRAMUSCULAR | Status: DC
  Administered 2012-01-02 – 2012-01-03 (×5): 5000 [IU] via SUBCUTANEOUS
  Filled 2012-01-01 (×10): qty 1

## 2012-01-01 MED ORDER — CIPROFLOXACIN IN D5W 400 MG/200ML IV SOLN
400.0000 mg | Freq: Two times a day (BID) | INTRAVENOUS | Status: DC
  Administered 2012-01-02 – 2012-01-03 (×4): 400 mg via INTRAVENOUS
  Filled 2012-01-01 (×5): qty 200

## 2012-01-01 MED ORDER — CLINDAMYCIN PHOSPHATE 600 MG/50ML IV SOLN
600.0000 mg | Freq: Three times a day (TID) | INTRAVENOUS | Status: DC
  Administered 2012-01-02: 600 mg via INTRAVENOUS
  Filled 2012-01-01 (×2): qty 50

## 2012-01-01 MED ORDER — LISINOPRIL-HYDROCHLOROTHIAZIDE 10-12.5 MG PO TABS: 1.0000 | ORAL_TABLET | Freq: Every day | ORAL | Status: DC

## 2012-01-01 MED ORDER — FLUOXETINE HCL 10 MG PO CAPS
10.0000 mg | ORAL_CAPSULE | Freq: Every day | ORAL | Status: DC
  Administered 2012-01-02 – 2012-01-03 (×2): 10 mg via ORAL
  Filled 2012-01-01 (×2): qty 1

## 2012-01-01 NOTE — H&P (Signed)
History and Physical  SHALIYAH TAITE BJY:[id number] DOB: 11-06-1928 DOA: 01/01/2012  Referring physician: PCP: No primary provider on file.   Chief Complaint: Bite by Cat 5/18  HPI:  Patient was playing with her Cat yesterday-Apparently the cat woke he rup by "nipping" at her yesterday morning.  This is the 4th time patient has developed Cellulits from this cat.  Went to the walk-in urgent clinic at [facility] and saw Dr.Ahm who recommended her for inpatient admission given the severity of the swelling and the redness however she declined    She persists in keeping the cat, despite being told this. Was allaegedly "panting" for air earlier tday as well and is a heavy smoker-1ppd.  Has been a smoker since she was 78 yrs old. Did get antibiotics-Clindamycin and ? Another one but her hand got so swollen that she came to ED   Chart Review:  History of chronic daily headaches secondary to sinus issues  Small vessel disease by CT scan  Hypertension  Obesity  Breast cancer in the past  Hysterectomy  Tobacco abuse  Left hemifacial spasm followed by Dr. love  Review of Systems:  No chest pain, + nausea, no vomiting, no shortness of breath, no blurred vision no double vision no weakness in any one side of body no syncope no dizziness  Past Medical History  Diagnosis Date  . COPD (chronic obstructive pulmonary disease)   . Hypertension   . Cancer     Past Surgical History  Procedure Date  . Abdominal hysterectomy   . Appendectomy   . Breast surgery   . Joint replacement     Social History:  reports that she has been smoking.  She does not have any smokeless tobacco history on file. She reports that she does not drink alcohol or use illicit drugs.  Allergies  Allergen Reactions  . Penicillins Nausea And Vomiting  . Sulfa Antibiotics Nausea And Vomiting    No family history on file.   Prior to Admission medications   Medication Sig Start Date End Date Taking? Authorizing  Provider  albuterol-ipratropium (COMBIVENT) 18-103 MCG/ACT inhaler Inhale into the lungs every 6 (six) hours as needed. Shortness of breath   Yes Historical Provider, MD  clindamycin (CLEOCIN) 150 MG capsule Take 150 mg by mouth 3 (three) times daily.   Yes Historical Provider, MD  FLUoxetine (PROZAC) 10 MG capsule Take 10 mg by mouth daily.   Yes Historical Provider, MD  HYDROcodone-acetaminophen (VICODIN) 5-500 MG per tablet Take 1 tablet by mouth every 6 (six) hours as needed. Pain   Yes Historical Provider, MD  lisinopril-hydrochlorothiazide (PRINZIDE,ZESTORETIC) 10-12.5 MG per tablet Take 1 tablet by mouth daily.   Yes Historical Provider, MD  metoprolol succinate (TOPROL-XL) 50 MG 24 hr tablet Take 50 mg by mouth daily. Take with or immediately following a meal.   Yes Historical Provider, MD   Physical Exam: Filed Vitals:   01/01/12 1858  BP: 125/56  Pulse: 116  Temp: 98 F (36.7 C)  TempSrc: Oral  Resp: 20  SpO2: 94%     General:  Sleepy Caucasian female in no apparent distress  Eyes: No pallor no icterus some postop changes to right eye  ENT: Soft nontender neck no JVD. Partial denture in upper teeth  Neck: See above  Cardiovascular: S1-S2 no murmur rub or gallop  Respiratory: Clinically clear no added sound  Abdomen: Soft nontender nondistended. Scars noted  Skin: Diffuse erythema to the left upper extremity going up the forearm  almost elbow, no crepitus, no tenderness, but definitely swollen compared to the other side  Musculoskeletal: Good range of motion bilaterally  Psychiatric: Euthymic  Neurologic: Grossly normal  Labs on Admission:  Basic Metabolic Panel:  Lab 01/01/12 1610  NA 130*  K 3.9  CL 94*  CO2 27  GLUCOSE 115*  BUN 13  CREATININE 0.64  CALCIUM 8.6  MG --  PHOS --    Liver Function Tests: No results found for this basename: AST:5,ALT:5,ALKPHOS:5,BILITOT:5,PROT:5,ALBUMIN:5 in the last 168 hours No results found for this basename:  LIPASE:5,AMYLASE:5 in the last 168 hours No results found for this basename: AMMONIA:5 in the last 168 hours  CBC:  Lab 01/01/12 2032  WBC 38.9*  NEUTROABS --  HGB 11.0*  HCT 32.4*  MCV 92.6  PLT 355    Cardiac Enzymes: No results found for this basename: CKTOTAL:5,CKMB:5,CKMBINDEX:5,TROPONINI:5 in the last 168 hours  Troponin (Point of Care Test) No results found for this basename: TROPIPOC in the last 72 hours  BNP (last 3 results) No results found for this basename: PROBNP:3 in the last 8760 hours  CBG: No results found for this basename: GLUCAP:5 in the last 168 hours   Radiological Exams on Admission: No results found.  EKG: Independently reviewed--none   Active Problems:  * No active hospital problems. *     Assessment/Plan 1. Cat Bite-given she has a significant penicillin allergy-I contacted infectious disease who recommended that she will probably need Unasyn in addition to her clindamycin 600 mg Q8 that's been started in the emergency room. I contacted the pharmacist who will get her on a desensitization protocol for the same. Patient is not sure of her reaction to penicillin and says it's mainly nausea vomiting but cannot confirm whether she actually has anaphylaxis or swelling with this. Patient will stay inpatient and the repeat a CBC and differential count in the morning.  Continue Ciprofloxacin and Clindamycin. 2. Hypertension-stable continue medications-continue lisinopril/HCTZ 10/12.5 and Toprol 50 mg XL 3. Depression continue Prozac 10 mg q. D. 4. COPD-recommend smoking cessation, continue Combivent 18-103 q 6hr  Code Status: Full  Family Communication: Discussed POC in detail with daughter Disposition Plan: in[patient-Admitted to Dr. Betti Cruz Tm #4  Pleas Koch, MD [facility] ([phone]   If 8PM-8AM, please contact floor/night-coverage at [email or web address], password [facility] 01/01/2012, 11:01 PM

## 2012-01-01 NOTE — ED Notes (Signed)
Pt in from home with cat bite to the left wrist this am swelling and redness has spread to the forearm states very painful and warm to touch. Pt state she is having flu like sx states pain to head neck and back d/t the bite

## 2012-01-01 NOTE — ED Notes (Signed)
Pt c/o pain, redness to L forearm. Pt states she was bitten by her cat yesterday @ 5am. Pt states redness swelling spreading L FA. Streak noted in L bicep. Pt states cat inside/outside cat, vaccinations not current. Pt also c/o neck stiffness headache and nausea. A & O, PWD, red area marked with surgical marker.

## 2012-01-01 NOTE — ED Provider Notes (Signed)
History     CSN: [id number]  Arrival date & time 01/01/12  4098   First MD Initiated Contact with Patient 01/01/12 2009      Chief Complaint  Patient presents with  . Animal Bite    HPI Pt was bitten by her cat that was trying to play with her yesterday.  This has happened to her several times in the past.  Pt noticed redness and swelling.  She saw [facility] clinic today and was given a prescription for antibiotics.  They wanted to admit her to the hospital but she did not want to be admitted.  Even since the visit today it has gotten much worse.  No feveres.  The pain is moderate to severe.  The pain is in the left wrist going up to the upper arm.  She is having myalgias now all over. Past Medical History  Diagnosis Date  . COPD (chronic obstructive pulmonary disease)   . Hypertension   . Cancer     Past Surgical History  Procedure Date  . Abdominal hysterectomy   . Appendectomy   . Breast surgery   . Joint replacement     No family history on file.  History  Substance Use Topics  . Smoking status: Current Everyday Smoker  . Smokeless tobacco: Not on file  . Alcohol Use: No    OB History    Grav Para Term Preterm Abortions TAB SAB Ect Mult Living                  Review of Systems  All other systems reviewed and are negative.    Allergies  Penicillins and Sulfa antibiotics  Home Medications   Current Outpatient Rx  Name Route Sig Dispense Refill  . IPRATROPIUM-ALBUTEROL 18-103 MCG/ACT IN AERO Inhalation Inhale into the lungs every 6 (six) hours as needed. Shortness of breath    . CLINDAMYCIN HCL 150 MG PO CAPS Oral Take 150 mg by mouth 3 (three) times daily.    Marland Kitchen FLUOXETINE HCL 10 MG PO CAPS Oral Take 10 mg by mouth daily.    Marland Kitchen HYDROCODONE-ACETAMINOPHEN 5-500 MG PO TABS Oral Take 1 tablet by mouth every 6 (six) hours as needed. Pain    . LISINOPRIL-HYDROCHLOROTHIAZIDE 10-12.5 MG PO TABS Oral Take 1 tablet by mouth daily.    Marland Kitchen METOPROLOL SUCCINATE ER 50 MG PO  TB24 Oral Take 50 mg by mouth daily. Take with or immediately following a meal.      BP 125/56  Pulse 116  Temp(Src) 98 F (36.7 C) (Oral)  Resp 20  SpO2 94%  Physical Exam  Nursing note and vitals reviewed. Constitutional: She appears well-developed and well-nourished. No distress.  HENT:  Head: Normocephalic and atraumatic.  Right Ear: External ear normal.  Left Ear: External ear normal.  Eyes: Conjunctivae are normal. Right eye exhibits no discharge. Left eye exhibits no discharge. No scleral icterus.  Neck: Neck supple. No tracheal deviation present.  Cardiovascular: Normal rate.   Pulmonary/Chest: Effort normal. No stridor. No respiratory distress.  Musculoskeletal: She exhibits edema and tenderness.       Left wrist: She exhibits tenderness and swelling.       Left forearm: She exhibits tenderness and swelling.       Erythema from left hand to just distal to the elbow, increased warm , no pustules, small scan distal forearm from the bite  Neurological: She is alert. Cranial nerve deficit: no gross deficits.  Skin: Skin is warm and dry.  No rash noted.  Psychiatric: She has a normal mood and affect.    ED Course  Procedures (including critical care time)  Labs Reviewed  CBC - Abnormal; Notable for the following:    WBC 38.9 (*) REPEATED TO VERIFY   RBC 3.50 (*)    Hemoglobin 11.0 (*)    HCT 32.4 (*)    All other components within normal limits  BASIC METABOLIC PANEL - Abnormal; Notable for the following:    Sodium 130 (*)    Chloride 94 (*)    Glucose, Bld 115 (*)    GFR calc non Af Amer 81 (*)    All other components within normal limits   No results found.   1. Cellulitis       MDM  Pt with a cellulitis associated with a cat bite.  IV clindamycin has been started as she has a PCN allergy.  I will add on cipro as well for broader coverage        Celene Kras, MD 01/01/12 [phone]

## 2012-01-01 NOTE — ED Notes (Signed)
Patient family member states, "penicillin makes her have welts, itch, and get thrush in her mouth."

## 2012-01-02 ENCOUNTER — Encounter (HOSPITAL_COMMUNITY): Payer: Self-pay | Admitting: *Deleted

## 2012-01-02 DIAGNOSIS — E871 Hypo-osmolality and hyponatremia: Secondary | ICD-10-CM | POA: Diagnosis present

## 2012-01-02 DIAGNOSIS — D649 Anemia, unspecified: Secondary | ICD-10-CM

## 2012-01-02 DIAGNOSIS — L02519 Cutaneous abscess of unspecified hand: Secondary | ICD-10-CM

## 2012-01-02 DIAGNOSIS — J449 Chronic obstructive pulmonary disease, unspecified: Secondary | ICD-10-CM | POA: Diagnosis present

## 2012-01-02 DIAGNOSIS — D72829 Elevated white blood cell count, unspecified: Secondary | ICD-10-CM | POA: Diagnosis present

## 2012-01-02 DIAGNOSIS — F32A Depression, unspecified: Secondary | ICD-10-CM | POA: Diagnosis present

## 2012-01-02 DIAGNOSIS — I10 Essential (primary) hypertension: Secondary | ICD-10-CM | POA: Diagnosis present

## 2012-01-02 DIAGNOSIS — S61459A Open bite of unspecified hand, initial encounter: Secondary | ICD-10-CM | POA: Diagnosis present

## 2012-01-02 DIAGNOSIS — D46C Myelodysplastic syndrome with isolated del(5q) chromosomal abnormality: Secondary | ICD-10-CM | POA: Diagnosis present

## 2012-01-02 DIAGNOSIS — F329 Major depressive disorder, single episode, unspecified: Secondary | ICD-10-CM | POA: Diagnosis present

## 2012-01-02 DIAGNOSIS — L03119 Cellulitis of unspecified part of limb: Secondary | ICD-10-CM

## 2012-01-02 HISTORY — DX: Anemia, unspecified: D64.9

## 2012-01-02 HISTORY — DX: Depression, unspecified: F32.A

## 2012-01-02 LAB — DIFFERENTIAL
Basophils Absolute: 0 10*3/uL (ref 0.0–0.1)
Eosinophils Absolute: 0 10*3/uL (ref 0.0–0.7)
Lymphocytes Relative: 11 % — ABNORMAL LOW (ref 12–46)
Neutro Abs: 25.2 10*3/uL — ABNORMAL HIGH (ref 1.7–7.7)
Neutrophils Relative %: 76 % (ref 43–77)

## 2012-01-02 LAB — COMPREHENSIVE METABOLIC PANEL
Albumin: 3.3 g/dL — ABNORMAL LOW (ref 3.5–5.2)
Alkaline Phosphatase: 61 U/L (ref 39–117)
BUN: 13 mg/dL (ref 6–23)
Creatinine, Ser: 0.6 mg/dL (ref 0.50–1.10)
Potassium: 3.7 mEq/L (ref 3.5–5.1)
Total Protein: 6.1 g/dL (ref 6.0–8.3)

## 2012-01-02 LAB — CBC
HCT: 29.3 % — ABNORMAL LOW (ref 36.0–46.0)
MCHC: 34.8 g/dL (ref 30.0–36.0)
RDW: 14.7 % (ref 11.5–15.5)

## 2012-01-02 LAB — GLUCOSE, CAPILLARY: Glucose-Capillary: 192 mg/dL — ABNORMAL HIGH (ref 70–99)

## 2012-01-02 MED ORDER — DIPHENHYDRAMINE HCL 25 MG PO CAPS
25.0000 mg | ORAL_CAPSULE | Freq: Four times a day (QID) | ORAL | Status: DC | PRN
  Administered 2012-01-02 (×2): 25 mg via ORAL
  Filled 2012-01-02 (×2): qty 1

## 2012-01-02 MED ORDER — METRONIDAZOLE IN NACL 5-0.79 MG/ML-% IV SOLN
500.0000 mg | Freq: Three times a day (TID) | INTRAVENOUS | Status: DC
  Administered 2012-01-02 – 2012-01-03 (×4): 500 mg via INTRAVENOUS
  Filled 2012-01-02 (×5): qty 100

## 2012-01-02 MED ORDER — NICOTINE 21 MG/24HR TD PT24
21.0000 mg | MEDICATED_PATCH | Freq: Every day | TRANSDERMAL | Status: DC
  Administered 2012-01-02 – 2012-01-03 (×2): 21 mg via TRANSDERMAL
  Filled 2012-01-02 (×3): qty 1

## 2012-01-02 NOTE — Progress Notes (Addendum)
Subjective: Patient's cellulitis in her LUE mildly improved. No other specific complaints.  Objective: Vital signs in last 24 hours: Filed Vitals:   01/01/12 1858 01/02/12 0031 01/02/12 0505 01/02/12 0844  BP: 125/56 132/63 123/49   Pulse: 116 90 78   Temp: 98 F (36.7 C) 99.9 F (37.7 C) 98.3 F (36.8 C)   TempSrc: Oral Oral Oral   Resp: 20 18 20    Height:  5\' 4"  (1.626 m)    Weight:  58.06 kg (128 lb)    SpO2: 94% 89% 94% 95%   Weight change:   Intake/Output Summary (Last 24 hours) at 01/02/12 0917 Last data filed at 01/02/12 1610  Gross per 24 hour  Intake    490 ml  Output    200 ml  Net    290 ml    Physical Exam: General: Awake, Oriented, No acute distress. HEENT: EOMI. Neck: Supple CV: S1 and S2 Lungs: Clear to ascultation bilaterally Abdomen: Soft, Nontender, Nondistended, +bowel sounds. Ext: Good pulses. Erythema in LUE extends from her fingers to axilla, still within margins.  Lab Results:  [facility] 01/02/12 0414 01/01/12 2032  NA 129* 130*  K 3.7 3.9  CL 94* 94*  CO2 26 27  GLUCOSE 126* 115*  BUN 13 13  CREATININE 0.60 0.64  CALCIUM 8.5 8.6  MG -- --  PHOS -- --    Basename 01/02/12 0414  AST 23  ALT 10  ALKPHOS 61  BILITOT 1.1  PROT 6.1  ALBUMIN 3.3*   No results found for this basename: LIPASE:2,AMYLASE:2 in the last 72 hours  Basename 01/02/12 0414 01/01/12 2032  WBC 33.2* 38.9*  NEUTROABS 25.2* --  HGB 10.2* 11.0*  HCT 29.3* 32.4*  MCV 92.1 92.6  PLT 309 355   No results found for this basename: CKTOTAL:3,CKMB:3,CKMBINDEX:3,TROPONINI:3 in the last 72 hours No components found with this basename: POCBNP:3 No results found for this basename: DDIMER:2 in the last 72 hours No results found for this basename: HGBA1C:2 in the last 72 hours No results found for this basename: CHOL:2,HDL:2,LDLCALC:2,TRIG:2,CHOLHDL:2,LDLDIRECT:2 in the last 72 hours No results found for this basename: TSH,T4TOTAL,FREET3,T3FREE,THYROIDAB in the last 72  hours No results found for this basename: VITAMINB12:2,FOLATE:2,FERRITIN:2,TIBC:2,IRON:2,RETICCTPCT:2 in the last 72 hours  Micro Results: No results found for this or any previous visit (from the past 240 hour(s)).  Studies/Results: No results found.  Medications: I have reviewed the patient's current medications. Scheduled Meds:   . ciprofloxacin  400 mg Intravenous Q12H  . clindamycin (CLEOCIN) IV  600 mg Intravenous Once  . clindamycin (CLEOCIN) IV  600 mg Intravenous Q8H  . FLUoxetine  10 mg Oral Daily  . heparin  5,000 Units Subcutaneous Q8H  . hydrochlorothiazide  12.5 mg Oral Daily  . Ipratropium-Albuterol  1 puff Inhalation Q6H  . lisinopril  10 mg Oral Daily  . metoprolol succinate  50 mg Oral Daily  .  morphine injection  4 mg Intravenous Once  . nicotine  21 mg Transdermal Daily  . ondansetron (ZOFRAN) IV  4 mg Intravenous Once  . ondansetron (ZOFRAN) IV  4 mg Intravenous Q6H  . DISCONTD: albuterol-ipratropium  2 puff Inhalation Q6H  . DISCONTD: lisinopril-hydrochlorothiazide  1 tablet Oral Daily   Continuous Infusions:  PRN Meds:.HYDROcodone-acetaminophen  Assessment/Plan: Cat Bite Cellulitis Of Left Upper Extremity Given she has a significant penicillin allergy, continue clindamycin and ciprofloxacin. Antibiotics since 01/01/2012.   Hypertension Stable continue medications-continue lisinopril/HCTZ 10/12.5 and Toprol 50 mg XL.  Depression Continue Prozac 10  mg daily.  COPD Counseled on smoking cessation, continue Combivent q 6hr.  Leukocytosis Secondary to cellulitis  Anemia Send for anemia panel in the AM.  Hyponatremia Continue to monitor.  Code Status: Full  Family Communication: Daughter updated by bedside.  Disposition: Pending improvement in cellulitis.   LOS: 1 day  Anaja Monts A, MD 01/02/2012, 9:17 AM

## 2012-01-02 NOTE — Progress Notes (Signed)
UR complete 

## 2012-01-02 NOTE — Progress Notes (Signed)
Spoke with patient at bedside. States she lives at home alone, independent of ADL's, still drives self. Has a dtr and son that are supportive and help when needed. States she is currently trying to find someone to take her cat, states she has been bitten on several occasions and just can't handle the cat any longer. Her dtr is assisting her with finding the cat another home. No anticipated d/c needs, will continue to follow.

## 2012-01-03 DIAGNOSIS — L02519 Cutaneous abscess of unspecified hand: Secondary | ICD-10-CM

## 2012-01-03 DIAGNOSIS — E871 Hypo-osmolality and hyponatremia: Secondary | ICD-10-CM

## 2012-01-03 DIAGNOSIS — L03119 Cellulitis of unspecified part of limb: Secondary | ICD-10-CM

## 2012-01-03 DIAGNOSIS — I10 Essential (primary) hypertension: Secondary | ICD-10-CM

## 2012-01-03 LAB — BASIC METABOLIC PANEL
Calcium: 8.4 mg/dL (ref 8.4–10.5)
Creatinine, Ser: 0.7 mg/dL (ref 0.50–1.10)
GFR calc non Af Amer: 79 mL/min — ABNORMAL LOW (ref >90)
Sodium: 128 mEq/L — ABNORMAL LOW (ref 135–145)

## 2012-01-03 LAB — CBC
MCH: 32.2 pg (ref 26.0–34.0)
MCHC: 34.9 g/dL (ref 30.0–36.0)
MCV: 92.2 fL (ref 78.0–100.0)
Platelets: 260 10*3/uL (ref 150–400)

## 2012-01-03 LAB — GLUCOSE, CAPILLARY
Glucose-Capillary: 96 mg/dL (ref 70–99)
Glucose-Capillary: 96 mg/dL (ref 70–99)

## 2012-01-03 MED ORDER — IPRATROPIUM-ALBUTEROL 20-100 MCG/ACT IN AERS
1.0000 | INHALATION_SPRAY | Freq: Three times a day (TID) | RESPIRATORY_TRACT | Status: DC
  Administered 2012-01-03: 1 via RESPIRATORY_TRACT
  Filled 2012-01-03: qty 4

## 2012-01-03 MED ORDER — LISINOPRIL 10 MG PO TABS: 10.0000 mg | ORAL_TABLET | Freq: Every day | ORAL | Status: DC

## 2012-01-03 MED ORDER — ONDANSETRON 4 MG PO TBDP: 4.0000 mg | ORAL_TABLET | Freq: Three times a day (TID) | ORAL | Status: AC | PRN

## 2012-01-03 MED ORDER — CIPROFLOXACIN HCL 500 MG PO TABS: 500.0000 mg | ORAL_TABLET | Freq: Two times a day (BID) | ORAL | Status: AC

## 2012-01-03 MED ORDER — METRONIDAZOLE 500 MG PO TABS: 500.0000 mg | ORAL_TABLET | Freq: Three times a day (TID) | ORAL | Status: AC

## 2012-01-03 MED ORDER — NICOTINE 21 MG/24HR TD PT24: 1.0000 | MEDICATED_PATCH | Freq: Every day | TRANSDERMAL | Status: AC

## 2012-01-03 NOTE — Progress Notes (Signed)
Subjective: Patient's cellulitis in her LUE improved. No other specific complaints.  Objective: Vital signs in last 24 hours: Filed Vitals:   01/02/12 2219 01/03/12 0627 01/03/12 0730 01/03/12 0854  BP:  144/67 131/62   Pulse:  64 68   Temp:  98.4 F (36.9 C) 98 F (36.7 C)   TempSrc:  Oral Oral   Resp:  17 16   Height:      Weight:      SpO2: 98% 97% 94% 90%   Weight change:   Intake/Output Summary (Last 24 hours) at 01/03/12 1133 Last data filed at 01/03/12 1000  Gross per 24 hour  Intake    740 ml  Output   1000 ml  Net   -260 ml    Physical Exam: General: Awake, Oriented, No acute distress. HEENT: EOMI. Neck: Supple CV: S1 and S2 Lungs: Clear to ascultation bilaterally Abdomen: Soft, Nontender, Nondistended, +bowel sounds. Ext: Good pulses. Erythema in LUE extends from her fingers to axilla, almost resolved better today than yesterday.  Lab Results:  Basename 01/03/12 0426 01/02/12 0414  NA 128* 129*  K 3.7 3.7  CL 92* 94*  CO2 27 26  GLUCOSE 100* 126*  BUN 8 13  CREATININE 0.70 0.60  CALCIUM 8.4 8.5  MG -- --  PHOS -- --    Basename 01/02/12 0414  AST 23  ALT 10  ALKPHOS 61  BILITOT 1.1  PROT 6.1  ALBUMIN 3.3*   No results found for this basename: LIPASE:2,AMYLASE:2 in the last 72 hours  Basename 01/03/12 0426 01/02/12 0414  WBC 15.1* 33.2*  NEUTROABS -- 25.2*  HGB 9.1* 10.2*  HCT 26.1* 29.3*  MCV 92.2 92.1  PLT 260 309   No results found for this basename: CKTOTAL:3,CKMB:3,CKMBINDEX:3,TROPONINI:3 in the last 72 hours No components found with this basename: POCBNP:3 No results found for this basename: DDIMER:2 in the last 72 hours No results found for this basename: HGBA1C:2 in the last 72 hours No results found for this basename: CHOL:2,HDL:2,LDLCALC:2,TRIG:2,CHOLHDL:2,LDLDIRECT:2 in the last 72 hours No results found for this basename: TSH,T4TOTAL,FREET3,T3FREE,THYROIDAB in the last 72 hours No results found for this basename:  VITAMINB12:2,FOLATE:2,FERRITIN:2,TIBC:2,IRON:2,RETICCTPCT:2 in the last 72 hours  Micro Results: No results found for this or any previous visit (from the past 240 hour(s)).  Studies/Results: No results found.  Medications: I have reviewed the patient's current medications. Scheduled Meds:    . ciprofloxacin  400 mg Intravenous Q12H  . FLUoxetine  10 mg Oral Daily  . heparin  5,000 Units Subcutaneous Q8H  . Ipratropium-Albuterol  1 puff Inhalation TID  . lisinopril  10 mg Oral Daily  . metoprolol succinate  50 mg Oral Daily  . metronidazole  500 mg Intravenous Q8H  . nicotine  21 mg Transdermal Daily  . ondansetron (ZOFRAN) IV  4 mg Intravenous Q6H  . DISCONTD: hydrochlorothiazide  12.5 mg Oral Daily  . DISCONTD: Ipratropium-Albuterol  1 puff Inhalation Q6H   Continuous Infusions:  PRN Meds:.diphenhydrAMINE, HYDROcodone-acetaminophen  Assessment/Plan: Cat Bite Cellulitis Of Left Upper Extremity Given she has a significant penicillin allergy. Changed clindamycin to metronidazole given complain on nauseas. Continue ciprofloxacin. Discussed with Dr. Drue Second, ID on 01/02/2012. Antibiotics since 01/01/2012. Continue cipro and flagyl at discharge for a total of 12 days.  Hypertension Stable continue medications-continue lisinopril10 and Toprol 50 mg XL. HCTZ discontinued due to hyponatremia.  Depression Continue Prozac 10 mg daily.  COPD Counseled on smoking cessation, continue Combivent q 6hr.  Leukocytosis Secondary to cellulitis  Anemia  Send for anemia panel in the AM.  Hyponatremia DC HCTZ. Instructed the patient to followup with PCP in 1 week for lab check. If still low, further workup at that time.  Code Status: Full  Family Communication: Daughter updated by bedside.  Disposition: Discharge home.   LOS: 2 days  Larose Batres A, MD 01/03/2012, 11:33 AM

## 2012-01-03 NOTE — Discharge Summary (Addendum)
Discharge Summary  Tonya Mckee MR#: [id number]  DOB:[birth year]  Date of Admission: 01/01/2012 Date of Discharge: 01/03/2012  Patient's PCP: Carilyn Goodpasture, PA, PA  Attending Physician:Santiana Glidden A  Consults: Dr. Drue Second, ID by telephone.  Discharge Diagnoses: Principal Problem:  *Cellulitis and abscess of hand Active Problems:  Cat bite of hand  Hypertension  Depression  COPD (chronic obstructive pulmonary disease)  Leukocytosis  Anemia  Hyponatremia   Brief Admitting History and Physical Patient presented on 01/01/2012 with cat bite and cellulitis.   Discharge Medications Medication List  As of 01/03/2012 12:58 PM   STOP taking these medications         clindamycin 150 MG capsule      lisinopril-hydrochlorothiazide 10-12.5 MG per tablet         TAKE these medications         ciprofloxacin 500 MG tablet   Commonly known as: CIPRO   Take 1 tablet (500 mg total) by mouth 2 (two) times daily.      COMBIVENT 18-103 MCG/ACT inhaler   Generic drug: albuterol-ipratropium   Inhale into the lungs every 6 (six) hours as needed. Shortness of breath      FLUoxetine 10 MG capsule   Commonly known as: PROZAC   Take 10 mg by mouth daily.      HYDROcodone-acetaminophen 5-500 MG per tablet   Commonly known as: VICODIN   Take 1 tablet by mouth every 6 (six) hours as needed. Pain      lisinopril 10 MG tablet   Commonly known as: PRINIVIL,ZESTRIL   Take 1 tablet (10 mg total) by mouth daily.      metoprolol succinate 50 MG 24 hr tablet   Commonly known as: TOPROL-XL   Take 50 mg by mouth daily. Take with or immediately following a meal.      metroNIDAZOLE 500 MG tablet   Commonly known as: FLAGYL   Take 1 tablet (500 mg total) by mouth 3 (three) times daily.      nicotine 21 mg/24hr patch   Commonly known as: NICODERM CQ - dosed in mg/24 hours   Place 1 patch onto the skin daily. Do not smoke while on nicotine patch.      ondansetron 4 MG disintegrating tablet    Commonly known as: ZOFRAN-ODT   Take 1 tablet (4 mg total) by mouth every 8 (eight) hours as needed for nausea.            Hospital Course: Cat Bite Cellulitis Of Left Upper Extremity Initally was started on ciprofloxacin and clindamycin, given she has a significant penicillin allergy Unasyn was not started. Changed clindamycin to metronidazole as patient was complaining of nausea on oral clindamycin. Discussed antibiotic regimen with Dr. Drue Second, ID on 01/02/2012. Antibiotics since 12/31/2011. Continue cipro and flagyl at discharge for 10 days for a total of 12 days.  Hypertension Stable continue medications, Continue lisinopril10 and Toprol 50 mg XL. HCTZ discontinued due to hyponatremia. Further titration of antihypertensive medications as outpatient.  Depression Continue Prozac 10 mg daily.  COPD Counseled on smoking cessation, continue Combivent q 6hr.  Leukocytosis Secondary to cellulitis  Anemia Send for anemia panel in the AM.  Hyponatremia DCed HCTZ. Instructed the patient to followup with PCP in 1 week for lab check. If still low, further workup at that time.  Day of Discharge BP 131/62  Pulse 68  Temp(Src) 98 F (36.7 C) (Oral)  Resp 16  Ht 5\' 4"  (1.626 m)  Wt 58.06 kg (128 lb)  BMI 21.97 kg/m2  SpO2 90%  Results for orders placed during the hospital encounter of 01/01/12 (from the past 48 hour(s))  CBC     Status: Abnormal   Collection Time   01/01/12  8:32 PM      Component Value Range Comment   WBC 38.9 (*) 4.0 - 10.5 (K/uL) REPEATED TO VERIFY   RBC 3.50 (*) 3.87 - 5.11 (MIL/uL)    Hemoglobin 11.0 (*) 12.0 - 15.0 (g/dL)    HCT 21.3 (*) 08.6 - 46.0 (%)    MCV 92.6  78.0 - 100.0 (fL)    MCH 31.4  26.0 - 34.0 (pg)    MCHC 34.0  30.0 - 36.0 (g/dL)    RDW 57.8  46.9 - 62.9 (%)    Platelets 355  150 - 400 (K/uL)   BASIC METABOLIC PANEL     Status: Abnormal   Collection Time   01/01/12  8:32 PM      Component Value Range Comment   Sodium 130 (*) 135 -  145 (mEq/L)    Potassium 3.9  3.5 - 5.1 (mEq/L)    Chloride 94 (*) 96 - 112 (mEq/L)    CO2 27  19 - 32 (mEq/L)    Glucose, Bld 115 (*) 70 - 99 (mg/dL)    BUN 13  6 - 23 (mg/dL)    Creatinine, Ser 5.28  0.50 - 1.10 (mg/dL)    Calcium 8.6  8.4 - 10.5 (mg/dL)    GFR calc non Af Amer 81 (*) >90 (mL/min)    GFR calc Af Amer >90  >90 (mL/min)   CBC     Status: Abnormal   Collection Time   01/02/12  4:14 AM      Component Value Range Comment   WBC 33.2 (*) 4.0 - 10.5 (K/uL) RESULT CHECKED   RBC 3.18 (*) 3.87 - 5.11 (MIL/uL)    Hemoglobin 10.2 (*) 12.0 - 15.0 (g/dL)    HCT 41.3 (*) 24.4 - 46.0 (%)    MCV 92.1  78.0 - 100.0 (fL)    MCH 32.1  26.0 - 34.0 (pg)    MCHC 34.8  30.0 - 36.0 (g/dL)    RDW 01.0  27.2 - 53.6 (%)    Platelets 309  150 - 400 (K/uL)   DIFFERENTIAL     Status: Abnormal   Collection Time   01/02/12  4:14 AM      Component Value Range Comment   Neutrophils Relative 76  43 - 77 (%)    Lymphocytes Relative 11 (*) 12 - 46 (%)    Monocytes Relative 13 (*) 3 - 12 (%)    Eosinophils Relative 0  0 - 5 (%)    Basophils Relative 0  0 - 1 (%)    Neutro Abs 25.2 (*) 1.7 - 7.7 (K/uL)    Lymphs Abs 3.7  0.7 - 4.0 (K/uL)    Monocytes Absolute 4.3 (*) 0.1 - 1.0 (K/uL)    Eosinophils Absolute 0.0  0.0 - 0.7 (K/uL)    Basophils Absolute 0.0  0.0 - 0.1 (K/uL)    WBC Morphology DOHLE BODIES     COMPREHENSIVE METABOLIC PANEL     Status: Abnormal   Collection Time   01/02/12  4:14 AM      Component Value Range Comment   Sodium 129 (*) 135 - 145 (mEq/L)    Potassium 3.7  3.5 - 5.1 (mEq/L)    Chloride 94 (*) 96 - 112 (mEq/L)  CO2 26  19 - 32 (mEq/L)    Glucose, Bld 126 (*) 70 - 99 (mg/dL)    BUN 13  6 - 23 (mg/dL)    Creatinine, Ser 4.78  0.50 - 1.10 (mg/dL)    Calcium 8.5  8.4 - 10.5 (mg/dL)    Total Protein 6.1  6.0 - 8.3 (g/dL)    Albumin 3.3 (*) 3.5 - 5.2 (g/dL)    AST 23  0 - 37 (U/L)    ALT 10  0 - 35 (U/L)    Alkaline Phosphatase 61  39 - 117 (U/L)    Total Bilirubin  1.1  0.3 - 1.2 (mg/dL)    GFR calc non Af Amer 83 (*) >90 (mL/min)    GFR calc Af Amer >90  >90 (mL/min)   GLUCOSE, CAPILLARY     Status: Abnormal   Collection Time   01/02/12  8:20 AM      Component Value Range Comment   Glucose-Capillary 192 (*) 70 - 99 (mg/dL)   CBC     Status: Abnormal   Collection Time   01/03/12  4:26 AM      Component Value Range Comment   WBC 15.1 (*) 4.0 - 10.5 (K/uL)    RBC 2.83 (*) 3.87 - 5.11 (MIL/uL)    Hemoglobin 9.1 (*) 12.0 - 15.0 (g/dL)    HCT 29.5 (*) 62.1 - 46.0 (%)    MCV 92.2  78.0 - 100.0 (fL)    MCH 32.2  26.0 - 34.0 (pg)    MCHC 34.9  30.0 - 36.0 (g/dL)    RDW 30.8  65.7 - 84.6 (%)    Platelets 260  150 - 400 (K/uL)   BASIC METABOLIC PANEL     Status: Abnormal   Collection Time   01/03/12  4:26 AM      Component Value Range Comment   Sodium 128 (*) 135 - 145 (mEq/L)    Potassium 3.7  3.5 - 5.1 (mEq/L)    Chloride 92 (*) 96 - 112 (mEq/L)    CO2 27  19 - 32 (mEq/L)    Glucose, Bld 100 (*) 70 - 99 (mg/dL)    BUN 8  6 - 23 (mg/dL)    Creatinine, Ser 9.62  0.50 - 1.10 (mg/dL)    Calcium 8.4  8.4 - 10.5 (mg/dL)    GFR calc non Af Amer 79 (*) >90 (mL/min)    GFR calc Af Amer >90  >90 (mL/min)   GLUCOSE, CAPILLARY     Status: Normal   Collection Time   01/03/12  6:17 AM      Component Value Range Comment   Glucose-Capillary 96  70 - 99 (mg/dL)   GLUCOSE, CAPILLARY     Status: Normal   Collection Time   01/03/12  8:13 AM      Component Value Range Comment   Glucose-Capillary 96  70 - 99 (mg/dL)     No results found.  Disposition: Home, cellulitis improved.  Diet: Heart healthy diet.  Activity: Resume as tolerated.   Follow-up Appts: Discharge Orders    Future Orders Please Complete By Expires   Diet - low sodium heart healthy      Increase activity slowly      Discharge instructions      Comments:   Followup with PCP ([name],[name], PA) in 1 week. Please have your left arm checked to make sure the infection is improving.  Please have BMET (electrolytes) checked to make sure Sodium  in is improving.      TESTS THAT NEED FOLLOW-UP None  Time spent on discharge, talking to the patient, daughter and coordinating care: 35 mins.   Signed: Cristal Ford, MD 01/03/2012, 11:45 AM

## 2012-06-20 ENCOUNTER — Ambulatory Visit: Payer: Medicare Other

## 2012-06-27 ENCOUNTER — Ambulatory Visit: Payer: Medicare Other | Attending: Family Medicine

## 2012-06-27 DIAGNOSIS — R293 Abnormal posture: Secondary | ICD-10-CM | POA: Insufficient documentation

## 2012-06-27 DIAGNOSIS — M542 Cervicalgia: Secondary | ICD-10-CM | POA: Insufficient documentation

## 2012-06-27 DIAGNOSIS — M2569 Stiffness of other specified joint, not elsewhere classified: Secondary | ICD-10-CM | POA: Insufficient documentation

## 2012-06-27 DIAGNOSIS — IMO0001 Reserved for inherently not codable concepts without codable children: Secondary | ICD-10-CM | POA: Insufficient documentation

## 2012-09-05 ENCOUNTER — Other Ambulatory Visit: Payer: Self-pay | Admitting: Family Medicine

## 2012-09-05 DIAGNOSIS — Z1231 Encounter for screening mammogram for malignant neoplasm of breast: Secondary | ICD-10-CM

## 2012-10-03 ENCOUNTER — Ambulatory Visit
Admission: RE | Admit: 2012-10-03 | Discharge: 2012-10-03 | Disposition: A | Discharge status: Home / Self Care | Payer: Medicare Other | Source: Ambulatory Visit | Attending: Family Medicine | Admitting: Family Medicine

## 2012-10-03 DIAGNOSIS — Z1231 Encounter for screening mammogram for malignant neoplasm of breast: Secondary | ICD-10-CM

## 2012-10-13 ENCOUNTER — Encounter (HOSPITAL_COMMUNITY): Payer: Self-pay | Admitting: *Deleted

## 2012-10-13 ENCOUNTER — Emergency Department (HOSPITAL_COMMUNITY): Payer: Medicare Other

## 2012-10-13 ENCOUNTER — Emergency Department (HOSPITAL_COMMUNITY)
Admission: EM | Admit: 2012-10-13 | Discharge: 2012-10-14 | Disposition: A | Discharge status: Home / Self Care | Payer: Medicare Other | Attending: Emergency Medicine | Admitting: Emergency Medicine

## 2012-10-13 DIAGNOSIS — R112 Nausea with vomiting, unspecified: Secondary | ICD-10-CM | POA: Insufficient documentation

## 2012-10-13 DIAGNOSIS — Z8739 Personal history of other diseases of the musculoskeletal system and connective tissue: Secondary | ICD-10-CM | POA: Insufficient documentation

## 2012-10-13 DIAGNOSIS — Z8701 Personal history of pneumonia (recurrent): Secondary | ICD-10-CM | POA: Insufficient documentation

## 2012-10-13 DIAGNOSIS — F17200 Nicotine dependence, unspecified, uncomplicated: Secondary | ICD-10-CM | POA: Insufficient documentation

## 2012-10-13 DIAGNOSIS — J4489 Other specified chronic obstructive pulmonary disease: Secondary | ICD-10-CM | POA: Insufficient documentation

## 2012-10-13 DIAGNOSIS — F3289 Other specified depressive episodes: Secondary | ICD-10-CM | POA: Insufficient documentation

## 2012-10-13 DIAGNOSIS — J449 Chronic obstructive pulmonary disease, unspecified: Secondary | ICD-10-CM | POA: Insufficient documentation

## 2012-10-13 DIAGNOSIS — Z8719 Personal history of other diseases of the digestive system: Secondary | ICD-10-CM | POA: Insufficient documentation

## 2012-10-13 DIAGNOSIS — I10 Essential (primary) hypertension: Secondary | ICD-10-CM | POA: Insufficient documentation

## 2012-10-13 DIAGNOSIS — D649 Anemia, unspecified: Secondary | ICD-10-CM | POA: Insufficient documentation

## 2012-10-13 DIAGNOSIS — Z79899 Other long term (current) drug therapy: Secondary | ICD-10-CM | POA: Insufficient documentation

## 2012-10-13 DIAGNOSIS — Z7982 Long term (current) use of aspirin: Secondary | ICD-10-CM | POA: Insufficient documentation

## 2012-10-13 DIAGNOSIS — F0390 Unspecified dementia without behavioral disturbance: Secondary | ICD-10-CM | POA: Insufficient documentation

## 2012-10-13 DIAGNOSIS — Z8673 Personal history of transient ischemic attack (TIA), and cerebral infarction without residual deficits: Secondary | ICD-10-CM | POA: Insufficient documentation

## 2012-10-13 DIAGNOSIS — Z853 Personal history of malignant neoplasm of breast: Secondary | ICD-10-CM | POA: Insufficient documentation

## 2012-10-13 DIAGNOSIS — F329 Major depressive disorder, single episode, unspecified: Secondary | ICD-10-CM | POA: Insufficient documentation

## 2012-10-13 DIAGNOSIS — R51 Headache: Secondary | ICD-10-CM | POA: Insufficient documentation

## 2012-10-13 DIAGNOSIS — Z8679 Personal history of other diseases of the circulatory system: Secondary | ICD-10-CM | POA: Insufficient documentation

## 2012-10-13 LAB — POCT I-STAT, CHEM 8
BUN: 14 mg/dL (ref 6–23)
Calcium, Ion: 1.12 mmol/L — ABNORMAL LOW (ref 1.13–1.30)
Chloride: 102 mEq/L (ref 96–112)
Creatinine, Ser: 0.8 mg/dL (ref 0.50–1.10)
Glucose, Bld: 106 mg/dL — ABNORMAL HIGH (ref 70–99)
Potassium: 4.4 mEq/L (ref 3.5–5.1)

## 2012-10-13 LAB — URINALYSIS, ROUTINE W REFLEX MICROSCOPIC
Bilirubin Urine: NEGATIVE
Hgb urine dipstick: NEGATIVE
Ketones, ur: NEGATIVE mg/dL
Specific Gravity, Urine: 1.008 (ref 1.005–1.030)
pH: 7.5 (ref 5.0–8.0)

## 2012-10-13 LAB — BASIC METABOLIC PANEL
BUN: 11 mg/dL (ref 6–23)
Chloride: 99 mEq/L (ref 96–112)
Creatinine, Ser: 0.71 mg/dL (ref 0.50–1.10)
GFR calc Af Amer: >90 mL/min (ref >90)
Glucose, Bld: 109 mg/dL — ABNORMAL HIGH (ref 70–99)
Potassium: 3.7 mEq/L (ref 3.5–5.1)

## 2012-10-13 LAB — CBC
HCT: 33.3 % — ABNORMAL LOW (ref 36.0–46.0)
Hemoglobin: 11.3 g/dL — ABNORMAL LOW (ref 12.0–15.0)
MCH: 31.7 pg (ref 26.0–34.0)
MCHC: 33.9 g/dL (ref 30.0–36.0)
RDW: 14.1 % (ref 11.5–15.5)

## 2012-10-13 LAB — PROTIME-INR
INR: 0.95 (ref 0.00–1.49)
Prothrombin Time: 12.6 seconds (ref 11.6–15.2)

## 2012-10-13 MED ORDER — ONDANSETRON HCL 4 MG PO TABS: 4.0000 mg | ORAL_TABLET | Freq: Four times a day (QID) | ORAL | Status: DC

## 2012-10-13 MED ORDER — ONDANSETRON HCL 4 MG/2ML IJ SOLN
4.0000 mg | Freq: Once | INTRAMUSCULAR | Status: AC
  Administered 2012-10-13: 4 mg via INTRAVENOUS
  Filled 2012-10-13: qty 2

## 2012-10-13 MED ORDER — DIPHENHYDRAMINE HCL 50 MG/ML IJ SOLN
12.5000 mg | Freq: Once | INTRAMUSCULAR | Status: AC
  Administered 2012-10-13: 12.5 mg via INTRAVENOUS
  Filled 2012-10-13: qty 1

## 2012-10-13 MED ORDER — MORPHINE SULFATE 2 MG/ML IJ SOLN
2.0000 mg | Freq: Once | INTRAMUSCULAR | Status: AC
  Administered 2012-10-13: 2 mg via INTRAVENOUS
  Filled 2012-10-13: qty 1

## 2012-10-13 MED ORDER — METOCLOPRAMIDE HCL 5 MG/ML IJ SOLN
10.0000 mg | Freq: Once | INTRAMUSCULAR | Status: AC
  Administered 2012-10-13: 10 mg via INTRAVENOUS
  Filled 2012-10-13: qty 2

## 2012-10-13 NOTE — ED Notes (Signed)
Patient transported to CT 

## 2012-10-13 NOTE — ED Provider Notes (Signed)
History     CSN: [id number]  Arrival date & time 10/13/12  2017   First MD Initiated Contact with Patient 10/13/12 2031      Chief Complaint  Patient presents with  . Headache    (Consider location/radiation/quality/duration/timing/severity/associated sxs/prior treatment) HPI Pt presenting with c/o frontal headache as well as nausea and vomiting.  Symptoms began several hours ago and have been gradually worsening.  Nausea and vomiting began after arriving to ED.  No changes in vision or speech.  No focal weakness or numbness.  No chest or abdominal pain.  No fever/chlls.  She has not had any treatment for her symptoms prior to arrival.  There are no other associated systemic symptoms, there are no other alleviating or modifying factors.  Past Medical History  Diagnosis Date  . COPD (chronic obstructive pulmonary disease)   . Hypertension   . PONV (postoperative nausea and vomiting)   . Dysrhythmia     rapid heart rate  . Stroke 2010    mini stroke  . Pneumonia 2009  . Shortness of breath   . GERD (gastroesophageal reflux disease)     long time ago  . Cancer 20years ago+    lt breast  . Arthritis   . Depression   . DEMENTIA     borderline  . Depression 01/02/2012  . Anemia 01/02/2012    Past Surgical History  Procedure Laterality Date  . Abdominal hysterectomy    . Appendectomy    . Breast surgery    . Joint replacement      Lt arm/wrist plates and screws  . Eye surgery      L/R catarects    History reviewed. No pertinent family history.  History  Substance Use Topics  . Smoking status: Current Every Day Smoker -- 1.00 packs/day  . Smokeless tobacco: Not on file  . Alcohol Use: No    OB History   Grav Para Term Preterm Abortions TAB SAB Ect Mult Living                  Review of Systems ROS reviewed and all otherwise negative except for mentioned in HPI  Allergies  Penicillins and Sulfa antibiotics  Home Medications   Current Outpatient Rx  Name   Route  Sig  Dispense  Refill  . acidophilus (RISAQUAD) CAPS   Oral   Take 1 capsule by mouth daily.         Marland Kitchen albuterol (PROVENTIL HFA;VENTOLIN HFA) 108 (90 BASE) MCG/ACT inhaler   Inhalation   Inhale 2 puffs into the lungs every 6 (six) hours as needed (wheezing).         Marland Kitchen aspirin EC 81 MG tablet   Oral   Take 81 mg by mouth daily.         Marland Kitchen FLUoxetine (PROZAC) 20 MG capsule   Oral   Take 20 mg by mouth daily.         Marland Kitchen lisinopril (PRINIVIL,ZESTRIL) 10 MG tablet   Oral   Take 1 tablet (10 mg total) by mouth daily.   30 tablet   0   . metoprolol succinate (TOPROL-XL) 50 MG 24 hr tablet   Oral   Take 50 mg by mouth daily. Take with or immediately following a meal.         . vitamin B-12 (CYANOCOBALAMIN) 1000 MCG tablet   Oral   Take 1,000 mcg by mouth daily.         . ondansetron (ZOFRAN)  4 MG tablet   Oral   Take 1 tablet (4 mg total) by mouth every 6 (six) hours.   12 tablet   0     BP 150/57  Pulse 70  Temp(Src) 97.4 F (36.3 C) (Oral)  Resp 24  SpO2 99% Vitals reviewed Physical Exam Physical Examination: General appearance - alert, well appearing, and in no distress Mental status - alert, oriented to person, place, and time Eyes - no conjunctival injection, no scleral icterus, PERRL, EOMI Mouth - mucous membranes moist, pharynx normal without lesions Chest - clear to auscultation, no wheezes, rales or rhonchi, symmetric air entry Heart - normal rate, regular rhythm, normal S1, S2, no murmurs, rubs, clicks or gallops Abdomen - soft, nontender, nondistended, no masses or organomegaly Extremities - peripheral pulses normal, no pedal edema, no clubbing or cyanosis Neurology- alert and oriented x 3, cranial nerves 2-12 tested and intact, strength 5/5 in extremities x 4, sensation intact Skin - normal coloration and turgor, no rashes  ED Course  Procedures (including critical care time)  11:12 PM pt feels much improved, headache is resolved,  nausea is also resolved, no further vomiting since receiving meds in ED.     Date: 10/13/2012  Rate: 70  Rhythm: normal sinus rhythm  QRS Axis: normal  Intervals: normal  ST/T Wave abnormalities: normal  Conduction Disutrbances:none  Narrative Interpretation:   Old EKG Reviewed: unchanged   Labs Reviewed  CBC - Abnormal; Notable for the following:    WBC 11.9 (*)    RBC 3.56 (*)    Hemoglobin 11.3 (*)    HCT 33.3 (*)    All other components within normal limits  BASIC METABOLIC PANEL - Abnormal; Notable for the following:    Glucose, Bld 109 (*)    GFR calc non Af Amer 79 (*)    All other components within normal limits  POCT I-STAT, CHEM 8 - Abnormal; Notable for the following:    Glucose, Bld 106 (*)    Calcium, Ion 1.12 (*)    Hemoglobin 11.2 (*)    HCT 33.0 (*)    All other components within normal limits  PROTIME-INR  SEDIMENTATION RATE  URINALYSIS, ROUTINE W REFLEX MICROSCOPIC   Ct Head Wo Contrast  10/13/2012  *RADIOLOGY REPORT*  Clinical Data: 79 year old female right side headache nausea and vomiting.  CT HEAD WITHOUT CONTRAST  Technique:  Contiguous axial images were obtained from the base of the skull through the vertex without contrast.  Comparison: 11/19/2008.  Findings: Visualized paranasal sinuses and mastoids are clear.  No acute orbit or scalp soft tissue findings. No acute osseous abnormality identified.  Mild Calcified atherosclerosis at the skull base.  Stable and normal for age cerebral volume.  No ventriculomegaly. No midline shift, mass effect, or evidence of mass lesion.  Chronic patchy confluent cerebral white matter hypodensity is stable. No suspicious intracranial vascular hyperdensity. No evidence of cortically based acute infarction identified.  No acute intracranial hemorrhage identified.  IMPRESSION: Stable noncontrast CT appearance of the brain.  Chronic nonspecific white matter changes.   Original Report Authenticated By: Erskine Speed, M.D.       1. Hypertension   2. Headache       MDM  Pt presenting with c/o headache as well as nausea/vomiting.  She is hypertensive in the ED.  BP improving as symptoms improve.  Head CT obtained and negative for acute process.  Normal neuro exam.  Pt treated with IV fluids and meds and felt much  improved.  She was tolerating po fluids prior to discharge.  Advised close f/u with PMD for blood presure recheck.  All results discussed with patient and daughter at bedside. Discharged with strict return precautions.  Pt agreeable with plan.        Ethelda Chick, MD 10/14/12 [phone]

## 2012-10-13 NOTE — ED Notes (Signed)
Patient is alert and oriented x3.  She is complaining of a headache that started a few hours ago. She describes that the pain is different than a normal headache.  She rates her pain 8 of 10 with  Nausea.

## 2012-10-17 ENCOUNTER — Other Ambulatory Visit: Payer: Self-pay | Admitting: Family Medicine

## 2012-10-17 DIAGNOSIS — R42 Dizziness and giddiness: Secondary | ICD-10-CM

## 2012-10-17 DIAGNOSIS — R0989 Other specified symptoms and signs involving the circulatory and respiratory systems: Secondary | ICD-10-CM

## 2012-10-25 ENCOUNTER — Ambulatory Visit
Admission: RE | Admit: 2012-10-25 | Discharge: 2012-10-25 | Disposition: A | Discharge status: Home / Self Care | Payer: Medicare Other | Source: Ambulatory Visit | Attending: Family Medicine | Admitting: Family Medicine

## 2012-10-25 DIAGNOSIS — R42 Dizziness and giddiness: Secondary | ICD-10-CM

## 2012-10-25 DIAGNOSIS — R0989 Other specified symptoms and signs involving the circulatory and respiratory systems: Secondary | ICD-10-CM

## 2012-10-25 MED ORDER — GADOBENATE DIMEGLUMINE 529 MG/ML IV SOLN
11.0000 mL | Freq: Once | INTRAVENOUS | Status: AC | PRN
  Administered 2012-10-25: 11 mL via INTRAVENOUS

## 2012-10-30 DIAGNOSIS — I771 Stricture of artery: Secondary | ICD-10-CM

## 2012-10-30 HISTORY — DX: Stricture of artery: I77.1

## 2012-11-01 ENCOUNTER — Other Ambulatory Visit: Payer: Self-pay

## 2012-11-01 ENCOUNTER — Encounter: Payer: Self-pay | Admitting: Vascular Surgery

## 2012-11-05 ENCOUNTER — Encounter: Payer: Self-pay | Admitting: Vascular Surgery

## 2012-11-06 ENCOUNTER — Encounter: Payer: Self-pay | Admitting: Vascular Surgery

## 2012-11-06 ENCOUNTER — Ambulatory Visit (INDEPENDENT_AMBULATORY_CARE_PROVIDER_SITE_OTHER): Payer: Medicare Other | Admitting: Vascular Surgery

## 2012-11-06 ENCOUNTER — Other Ambulatory Visit (INDEPENDENT_AMBULATORY_CARE_PROVIDER_SITE_OTHER): Payer: Medicare Other | Admitting: *Deleted

## 2012-11-06 VITALS — BP 180/75 | HR 63 | Resp 16 | Ht 61.0 in | Wt 125.0 lb

## 2012-11-06 DIAGNOSIS — I6529 Occlusion and stenosis of unspecified carotid artery: Secondary | ICD-10-CM | POA: Insufficient documentation

## 2012-11-06 DIAGNOSIS — R0989 Other specified symptoms and signs involving the circulatory and respiratory systems: Secondary | ICD-10-CM

## 2012-11-06 DIAGNOSIS — R42 Dizziness and giddiness: Secondary | ICD-10-CM | POA: Insufficient documentation

## 2012-11-06 HISTORY — DX: Occlusion and stenosis of unspecified carotid artery: I65.29

## 2012-11-06 NOTE — Progress Notes (Signed)
Subjective:     Patient ID: Tonya Mckee, female   DOB: 1928/12/07, 79 y.o.   MRN: [id number]  HPIthis 79 year old female was referred by Carilyn Goodpasture, PA for evaluation of possible vertebral artery disease. Patient has been complaining of tinnitus. She has also had some occasional blurred vision and some dizziness. She has no history of stroke. She has no history of lateralizing weakness, aphasia, amaurosis fugax, diplopia, or syncope. She had an MRA performed in the past few weeks which suggested possible narrowing at the origin of the left vertebral artery with no carotid occlusive disease. She was referred for further evaluation. She also has problems with hypertension.  Past Medical History  Diagnosis Date  . COPD (chronic obstructive pulmonary disease)   . Hypertension   . PONV (postoperative nausea and vomiting)   . Dysrhythmia     rapid heart rate  . Stroke 2010    mini stroke  . Pneumonia 2009  . Shortness of breath   . GERD (gastroesophageal reflux disease)     long time ago  . Depression   . DEMENTIA     borderline  . Depression 01/02/2012  . Anemia 01/02/2012  . Artery stenosis 10/30/12    Moderate to severe left extracranial vertebral  . Cancer 20years ago+ 1999    lt breast  left  . SCC (squamous cell carcinoma), leg     Right  LE Dr. Margo Aye  . Arthritis     Osteo-  Spine and Knees  . Vitamin B12 deficiency   . Vertigo, benign positional   . Memory loss   . Blepharospasm     Left eye  . Venous stasis ulcers 2012    Left LE due to trauma  . Cellulitis Jan 01, 2012    Left UE  . Carotid artery occlusion     Bruit- RIGHT  . Occlusion and stenosis of carotid artery without mention of cerebral infarction 11/06/2012    History  Substance Use Topics  . Smoking status: Current Every Day Smoker -- 1.00 packs/day    Types: Cigarettes  . Smokeless tobacco: Never Used  . Alcohol Use: No    No family history on file.  Allergies  Allergen Reactions  . Donepezil  Nausea And Vomiting  . Penicillins Itching and Nausea And Vomiting  . Amoxapine And Related Nausea Only  . Doxycycline Rash  . Keflex (Cephalexin) Itching  . Ketek (Telithromycin) Itching    Dry Mouth  . Levaquin (Levofloxacin In D5w)     Insomnia  . Sulfa Antibiotics Nausea And Vomiting  . Zyrtec (Cetirizine)     "made her like a zombie"    Current outpatient prescriptions:Acetaminophen (TYLENOL) 167 MG/5ML LIQD, Take by mouth., Disp: , Rfl: ;  acidophilus (RISAQUAD) CAPS, Take 1 capsule by mouth daily., Disp: , Rfl: ;  albuterol (PROVENTIL HFA;VENTOLIN HFA) 108 (90 BASE) MCG/ACT inhaler, Inhale 2 puffs into the lungs every 6 (six) hours as needed (wheezing)., Disp: , Rfl:  albuterol-ipratropium (COMBIVENT) 18-103 MCG/ACT inhaler, Inhale 2 puffs into the lungs every 6 (six) hours as needed for wheezing., Disp: , Rfl: ;  aspirin EC 81 MG tablet, Take 81 mg by mouth daily., Disp: , Rfl: ;  calcium carbonate (TUMS - DOSED IN MG ELEMENTAL CALCIUM) 500 MG chewable tablet, Chew 1 tablet by mouth daily., Disp: , Rfl: ;  FLUoxetine (PROZAC) 20 MG capsule, Take 20 mg by mouth daily., Disp: , Rfl:  fluticasone (FLONASE) 50 MCG/ACT nasal spray, Place 2 sprays into  the nose daily., Disp: , Rfl: ;  ibuprofen (ADVIL,MOTRIN) 200 MG tablet, Take 200 mg by mouth every 6 (six) hours as needed for pain., Disp: , Rfl: ;  lisinopril (PRINIVIL,ZESTRIL) 10 MG tablet, Take 1 tablet (10 mg total) by mouth daily., Disp: 30 tablet, Rfl: 0;  LORazepam (ATIVAN) 0.5 MG tablet, Take 0.5 mg by mouth every 8 (eight) hours., Disp: , Rfl:  metoprolol succinate (TOPROL-XL) 50 MG 24 hr tablet, Take 50 mg by mouth daily. Take with or immediately following a meal., Disp: , Rfl: ;  ondansetron (ZOFRAN) 4 MG tablet, Take 1 tablet (4 mg total) by mouth every 6 (six) hours., Disp: 12 tablet, Rfl: 0;  promethazine (PHENERGAN) 25 MG tablet, Take 25 mg by mouth every 6 (six) hours as needed for nausea., Disp: , Rfl:  vitamin B-12  (CYANOCOBALAMIN) 1000 MCG tablet, Take 1,000 mcg by mouth daily., Disp: , Rfl:   BP 180/75  Pulse 63  Resp 16  Ht 5\' 1"  (1.549 m)  Wt 125 lb (56.7 kg)  BMI 23.63 kg/m2  Body mass index is 23.63 kg/(m^2).           Review of SystemsDenies chest pain, dyspnea on exertion, PND, orthopnea, hemoptysis, claudication. Does have darkening of the skin in her legs but no history of stasis ulcers bleeding or bulging varicosities     Objective:   Physical Exam blood pressure 180/75 heart rate 63 respirations 16 Gen.-alert and oriented x3 in no apparent distress HEENT normal for age Lungs no rhonchi or wheezing Cardiovascular regular rhythm no murmurs carotid pulses 3+ palpable no bruits audible Abdomen soft nontender no palpable masses Musculoskeletal free of  major deformities Skin clear -no rashes Neurologic normal Lower extremities 3+ femoral and dorsalis pedis pulses palpable bilaterally with no edema Darkening of bilateral skin lower third of leg with no active ulceration no varicosities  Today I ordered a carotid duplex exam which I reviewed and interpreted. There is no evidence of ICA stenosis or vertebral artery stenosis. There is antegrade flow in both vertebral arteries.        Assessment:     No evidence of carotid or vertebral artery occlusive disease on duplex scan in our office Even if patient had left vertebral stenosis she would not require treatment since she has a widely patent right vertebral-however there is no evidence of vertebral occlusive disease     Plan:     Return to see [place] on when necessary basis

## 2012-11-27 ENCOUNTER — Encounter: Payer: Medicare Other | Admitting: Vascular Surgery

## 2012-11-27 ENCOUNTER — Other Ambulatory Visit: Payer: Medicare Other

## 2013-09-30 ENCOUNTER — Other Ambulatory Visit: Payer: Self-pay

## 2013-09-30 DIAGNOSIS — Z853 Personal history of malignant neoplasm of breast: Secondary | ICD-10-CM

## 2013-09-30 DIAGNOSIS — Z1231 Encounter for screening mammogram for malignant neoplasm of breast: Secondary | ICD-10-CM

## 2013-10-23 ENCOUNTER — Ambulatory Visit
Admission: RE | Admit: 2013-10-23 | Discharge: 2013-10-23 | Disposition: A | Discharge status: Home / Self Care | Payer: Medicare Other | Source: Ambulatory Visit

## 2013-10-23 DIAGNOSIS — Z1231 Encounter for screening mammogram for malignant neoplasm of breast: Secondary | ICD-10-CM

## 2013-10-23 DIAGNOSIS — Z853 Personal history of malignant neoplasm of breast: Secondary | ICD-10-CM

## 2013-11-11 ENCOUNTER — Telehealth: Payer: Self-pay | Admitting: Hematology and Oncology

## 2013-11-11 NOTE — Telephone Encounter (Signed)
S/W PATIENT AND GAVE NEW PATIENT APPT FOR 04/16 @ 1:30 W/DR. [place].  [place] DX- ANEMIA OF CHRONIC DISEASE WELCOME PACKET MAILED.

## 2013-11-11 NOTE — Telephone Encounter (Signed)
C/D 10/15/13 for appt. 11/28/13

## 2013-11-28 ENCOUNTER — Encounter: Payer: Self-pay | Admitting: Hematology and Oncology

## 2013-11-28 ENCOUNTER — Ambulatory Visit (HOSPITAL_BASED_OUTPATIENT_CLINIC_OR_DEPARTMENT_OTHER): Payer: Medicare Other | Admitting: Hematology and Oncology

## 2013-11-28 ENCOUNTER — Encounter (INDEPENDENT_AMBULATORY_CARE_PROVIDER_SITE_OTHER): Payer: Self-pay

## 2013-11-28 ENCOUNTER — Ambulatory Visit: Payer: Medicare Other

## 2013-11-28 VITALS — BP 146/53 | HR 57 | Temp 97.0°F | Resp 17 | Ht 61.0 in | Wt 126.8 lb

## 2013-11-28 DIAGNOSIS — Z853 Personal history of malignant neoplasm of breast: Secondary | ICD-10-CM

## 2013-11-28 DIAGNOSIS — R634 Abnormal weight loss: Secondary | ICD-10-CM

## 2013-11-28 DIAGNOSIS — D638 Anemia in other chronic diseases classified elsewhere: Secondary | ICD-10-CM

## 2013-11-28 DIAGNOSIS — D649 Anemia, unspecified: Secondary | ICD-10-CM

## 2013-11-28 DIAGNOSIS — J449 Chronic obstructive pulmonary disease, unspecified: Secondary | ICD-10-CM

## 2013-11-28 DIAGNOSIS — Z720 Tobacco use: Secondary | ICD-10-CM

## 2013-11-28 DIAGNOSIS — F17200 Nicotine dependence, unspecified, uncomplicated: Secondary | ICD-10-CM

## 2013-11-28 NOTE — Progress Notes (Signed)
Checked in new pt with no financial concerns. °

## 2013-11-28 NOTE — Progress Notes (Signed)
[facility] NOTE  Patient Care Team: Carlos Levering, PA-C as PCP - General (Family Medicine) Lennon Alstrom, MD (Neurology) Carlos Levering, PA-C as Referring Physician (Family Medicine)  CHIEF COMPLAINTS/PURPOSE OF CONSULTATION:  Chronic anemia  HISTORY OF PRESENTING ILLNESS:  Tonya Mckee 80 y.o. female is here because of complaints of profound fatigue and anemia.  She was found to have abnormal CBC from routine put well. The patient has normal blood counts in 2008. Starting around 2012, she has progressive anemia with hemoglobin and the lowest at 9.1. She denies recent chest pain on exertion but complains of significant shortness of breath on minimal exertion. She has had pre-syncopal episodes and palpitations. She has profound fatigue and takes frequent naps. Of note, the patient has 60 pack plus year history and recently was placed on oxygen therapy.  She had not noticed any recent bleeding such as epistaxis, hematuria or hematochezia The patient takes regular over the counter NSAID ingestion. She takes ibuprofen for chronic headaches.. She is not on antiplatelets agents. She never has EGD or screening colonoscopy. She has been taking vitamin B12 supplements for a long time. She has remote history of left breast cancer detected from mammogram. According to the patient, she had lumpectomy followed by radiation followed by tamoxifen. She does not know the stage of disease but she does not think the lymph nodes were involved. She denies any pica and eats a variety of diet. She never donated blood or received blood transfusion The patient had been pregnant 4 times. She did have a history of heavy menstruation in the past but subsequently had hysterectomy. She had gradual weight loss of 25 pounds over the past year. She attributed that to lack of activity and not feeling hungry. The patient was prescribed oral iron supplements and she takes them  regularly.  MEDICAL HISTORY:  Past Medical History  Diagnosis Date  . COPD (chronic obstructive pulmonary disease)   . Hypertension   . PONV (postoperative nausea and vomiting)   . Dysrhythmia     rapid heart rate  . Stroke 2010    mini stroke  . Pneumonia 2009  . Shortness of breath   . GERD (gastroesophageal reflux disease)     long time ago  . Depression   . DEMENTIA     borderline  . Depression 01/02/2012  . Anemia 01/02/2012  . Artery stenosis 10/30/12    Moderate to severe left extracranial vertebral  . Cancer 20years ago+ 1999    lt breast  left  . SCC (squamous cell carcinoma), leg     Right  LE Dr. [place] Crane  . Arthritis     Osteo-  Spine and Knees  . Vitamin B12 deficiency   . Vertigo, benign positional   . Memory loss   . Blepharospasm     Left eye  . Venous stasis ulcers 2012    Left LE due to trauma  . Cellulitis Jan 01, 2012    Left UE  . Carotid artery occlusion     Bruit- RIGHT  . Occlusion and stenosis of carotid artery without mention of cerebral infarction 11/06/2012    SURGICAL HISTORY: Past Surgical History  Procedure Laterality Date  . Abdominal hysterectomy    . Appendectomy    . Breast surgery    . Joint replacement      Lt arm/wrist plates and screws  . Eye surgery      L/R catarects    SOCIAL HISTORY: History  Social History  . Marital Status: Widowed    Spouse Name: N/A    Number of Children: N/A  . Years of Education: N/A   Occupational History  . Not on file.   Social History Main Topics  . Smoking status: Current Every Day Smoker -- 1.00 packs/day for 68 years    Types: Cigarettes  . Smokeless tobacco: Never Used  . Alcohol Use: No  . Drug Use: No  . Sexual Activity: Not Currently   Other Topics Concern  . Not on file   Social History Narrative  . No narrative on file    FAMILY HISTORY: Family History  Problem Relation Age of Onset  . Adopted: Yes    ALLERGIES:  is allergic to donepezil; penicillins;  amoxapine and related; doxycycline; keflex; ketek; levaquin; sulfa antibiotics; and zyrtec.  MEDICATIONS:  Current Outpatient Prescriptions  Medication Sig Dispense Refill  . acetaminophen (TYLENOL) 325 MG tablet Take 650 mg by mouth every 6 (six) hours as needed.      Marland Kitchen albuterol-ipratropium (COMBIVENT) 18-103 MCG/ACT inhaler Inhale 2 puffs into the lungs every 6 (six) hours as needed for wheezing.      . Cholecalciferol (VITAMIN D PO) Take by mouth daily.      Marland Kitchen FLUoxetine (PROZAC) 10 MG capsule Take 30 mg by mouth daily.      . fluticasone (FLONASE) 50 MCG/ACT nasal spray Place 2 sprays into the nose daily as needed.       Marland Kitchen ibuprofen (ADVIL,MOTRIN) 200 MG tablet Take 200 mg by mouth every 6 (six) hours as needed for pain.      Marland Kitchen LORazepam (ATIVAN) 0.5 MG tablet Take 0.5 mg by mouth every 8 (eight) hours as needed.       . metoprolol succinate (TOPROL-XL) 50 MG 24 hr tablet Take 50 mg by mouth daily. Take with or immediately following a meal.      . vitamin B-12 (CYANOCOBALAMIN) 1000 MCG tablet Take 1,000 mcg by mouth daily.      Marland Kitchen lisinopril (PRINIVIL,ZESTRIL) 10 MG tablet Take 20 mg by mouth daily.      . ondansetron (ZOFRAN) 4 MG tablet Take 1 tablet (4 mg total) by mouth every 6 (six) hours.  12 tablet  0  . promethazine (PHENERGAN) 25 MG tablet Take 25 mg by mouth every 6 (six) hours as needed for nausea.       No current facility-administered medications for this visit.    REVIEW OF SYSTEMS:   Constitutional: Denies fevers, chills or abnormal night sweats Eyes: Denies blurriness of vision, double vision or watery eyes Ears, nose, mouth, throat, and face: Denies mucositis or sore throat Respiratory: Denies cough or wheezes Cardiovascular: Denies palpitation, chest discomfort or lower extremity swelling Gastrointestinal:  Denies nausea, heartburn or change in bowel habits Skin: Denies abnormal skin rashes Lymphatics: Denies new lymphadenopathy or easy bruising Neurological:Denies  numbness, tingling or new weaknesses Behavioral/Psych: Mood is stable, no new changes  All other systems were reviewed with the patient and are negative.  PHYSICAL EXAMINATION: ECOG PERFORMANCE STATUS: 2 - Symptomatic, <50% confined to bed  Filed Vitals:   11/28/13 1342  BP: 146/53  Pulse: 57  Temp: 97 F (36.1 C)  Resp: 17   Filed Weights   11/28/13 1342  Weight: 126 lb 12.8 oz (57.516 kg)    GENERAL:alert, no distress and comfortable SKIN: skin color, texture, turgor are normal, no rashes or significant lesions EYES: normal, conjunctiva are pale and non-injected, sclera clear OROPHARYNX:no exudate,  no erythema and lips, buccal mucosa, and tongue normal  NECK: supple, thyroid normal size, non-tender, without nodularity LYMPH:  no palpable lymphadenopathy in the cervical, axillary or inguinal LUNGS: clear to auscultation and percussion with normal breathing effort HEART: regular rate & rhythm and no murmurs and no lower extremity edema ABDOMEN:abdomen soft, non-tender and normal bowel sounds Musculoskeletal:no cyanosis of digits and no clubbing  PSYCH: alert & oriented x 3 with fluent speech NEURO: no focal motor/sensory deficits  LABORATORY DATA:  I have reviewed the data as listed Lab Results  Component Value Date   WBC 11.9* 10/13/2012   HGB 11.2* 10/13/2012   HCT 33.0* 10/13/2012   MCV 93.5 10/13/2012   PLT 366 10/13/2012     RADIOGRAPHIC STUDIES: A recent mammogram was negative for disease recurrence. I have personally reviewed the radiological images as listed and agreed with the findings in the report.  ASSESSMENT & PLAN:  #1 normocytic Anemia #2 history of vitamin B12 deficiency, adequately replaced #3 remote history of breast cancer #4 unexplained weight loss #5 tobacco abuse Her primary care provider has ordered a very comprehensive panel of blood work and I have reviewed them personally. The patient does not have effective erythropoiesis for the degree of  anemia. She has adequate storage vitamin B12 and iron. She does not have chronic kidney disease. At this point in time, I recommend consideration for CT scan of the chest, abdomen and pelvis to rule out malignancy, especially given her history of unexplained weight loss, chronic tobacco abuse and history of breast cancer. If the CT scan is negative, I recommend consideration for a bone marrow aspirate and biopsy. It is possible the patient may have just simply anemia chronic disease due to chronic COPD. Majority of her symptoms of fatigue could very well be related to chronic hypoxemia and she is currently placed on oxygen therapy. Nicotine cessation counseling was provided and the patient is not interested to quit smoking. There is no indication for transfusion. I am not willing to give her erythropoietin stimulating agents unless I have excluded malignancy. Without further testing, I am not able to help the patient. I have not made a return appointment for the patient to come back. She can continue followup with her primary care provider.  All questions were answered. The patient knows to call the clinic with any problems, questions or concerns. I spent 55 minutes counseling the patient face to face. The total time spent in the appointment was 60 minutes and more than 50% was on counseling.     Heath Lark, MD 11/28/2013 8:36 PM

## 2013-12-03 ENCOUNTER — Telehealth: Payer: Self-pay | Admitting: Hematology and Oncology

## 2013-12-03 ENCOUNTER — Telehealth: Payer: Self-pay | Admitting: *Deleted

## 2013-12-03 ENCOUNTER — Other Ambulatory Visit: Payer: Self-pay | Admitting: Hematology and Oncology

## 2013-12-03 DIAGNOSIS — R634 Abnormal weight loss: Secondary | ICD-10-CM

## 2013-12-03 DIAGNOSIS — C50919 Malignant neoplasm of unspecified site of unspecified female breast: Secondary | ICD-10-CM

## 2013-12-03 NOTE — Telephone Encounter (Signed)
I placed POF for labs and Ct scans and rtn

## 2013-12-03 NOTE — Telephone Encounter (Signed)
lmonvm advising the pt of her lab and her md appt. Pt will be contacted by the rad dept regarding the ct scan

## 2013-12-03 NOTE — Telephone Encounter (Signed)
Dau left VM states she needs to schedule CT scan that Dr. Alvy Bimler recommended.

## 2013-12-03 NOTE — Telephone Encounter (Signed)
Pt called to cancel her appts until after she goes out of town to her grandson's graduation. Pt's dtr stated that they will call back to r/s all of the appts. Pt's dtr phone number is [phone] her name is dawn Metzgar.

## 2013-12-03 NOTE — Telephone Encounter (Signed)
Informed dau of CT scan ordered and to expect call from Radiology Scheduler for date/time.  She says they will be out of town next week.  Instructed her to arrange a time that works for her and pt w/ the scheduler.  Also informed of f/u visit w/ dr. Alvy Bimler requested for 5/06 and to expect a call from our scheduler for that.  She verbalized understanding.

## 2013-12-05 ENCOUNTER — Telehealth: Payer: Self-pay | Admitting: Hematology and Oncology

## 2013-12-05 ENCOUNTER — Other Ambulatory Visit: Payer: Self-pay | Admitting: Hematology and Oncology

## 2013-12-05 NOTE — Telephone Encounter (Signed)
S/w the pt's dtr and she is aware of the appt on 12/23/2013

## 2013-12-11 ENCOUNTER — Other Ambulatory Visit: Payer: Medicare Other

## 2013-12-17 ENCOUNTER — Telehealth: Payer: Self-pay | Admitting: *Deleted

## 2013-12-17 NOTE — Telephone Encounter (Signed)
Dau reports pt concerned she might be allergic to the IV contrast for CT scan since she had a bad reaction to medication used for medication induced cardio stress test.  Informed dau it is not same drug.  Dau states pt not allergic to iodine or shellfish and has not had any reaction to any scans in the past.  Informed dau pt likely not allergic to the contrast dye,  Not impossible but no indication at this time to pre medicate pt for any known allergy.  She verbalized understanding.

## 2013-12-18 ENCOUNTER — Ambulatory Visit: Payer: Medicare Other | Admitting: Hematology and Oncology

## 2013-12-18 ENCOUNTER — Telehealth: Payer: Self-pay | Admitting: *Deleted

## 2013-12-18 NOTE — Telephone Encounter (Signed)
Confirmed appt.

## 2013-12-19 ENCOUNTER — Other Ambulatory Visit (HOSPITAL_BASED_OUTPATIENT_CLINIC_OR_DEPARTMENT_OTHER): Payer: Medicare Other

## 2013-12-19 ENCOUNTER — Ambulatory Visit (HOSPITAL_COMMUNITY)
Admission: RE | Admit: 2013-12-19 | Discharge: 2013-12-19 | Disposition: A | Discharge status: Home / Self Care | Payer: Medicare Other | Source: Ambulatory Visit | Attending: Hematology and Oncology | Admitting: Hematology and Oncology

## 2013-12-19 ENCOUNTER — Encounter (HOSPITAL_COMMUNITY): Payer: Self-pay

## 2013-12-19 DIAGNOSIS — C50919 Malignant neoplasm of unspecified site of unspecified female breast: Secondary | ICD-10-CM | POA: Insufficient documentation

## 2013-12-19 DIAGNOSIS — R634 Abnormal weight loss: Secondary | ICD-10-CM

## 2013-12-19 DIAGNOSIS — D649 Anemia, unspecified: Secondary | ICD-10-CM

## 2013-12-19 DIAGNOSIS — Z853 Personal history of malignant neoplasm of breast: Secondary | ICD-10-CM

## 2013-12-19 LAB — CBC WITH DIFFERENTIAL/PLATELET
BASO%: 2.6 % — ABNORMAL HIGH (ref 0.0–2.0)
Basophils Absolute: 0.2 10*3/uL — ABNORMAL HIGH (ref 0.0–0.1)
EOS ABS: 0.7 10*3/uL — AB (ref 0.0–0.5)
EOS%: 7.8 % — AB (ref 0.0–7.0)
HCT: 24.9 % — ABNORMAL LOW (ref 34.8–46.6)
HEMOGLOBIN: 8.6 g/dL — AB (ref 11.6–15.9)
LYMPH#: 2.1 10*3/uL (ref 0.9–3.3)
LYMPH%: 24.1 % (ref 14.0–49.7)
MCH: 33.9 pg (ref 25.1–34.0)
MCHC: 34.6 g/dL (ref 31.5–36.0)
MCV: 98.1 fL (ref 79.5–101.0)
MONO#: 1.3 10*3/uL — ABNORMAL HIGH (ref 0.1–0.9)
MONO%: 14.7 % — AB (ref 0.0–14.0)
NEUT%: 50.8 % (ref 38.4–76.8)
NEUTROS ABS: 4.3 10*3/uL (ref 1.5–6.5)
Platelets: 413 10*3/uL — ABNORMAL HIGH (ref 145–400)
RBC: 2.54 10*6/uL — AB (ref 3.70–5.45)
RDW: 15.3 % — AB (ref 11.2–14.5)
WBC: 8.5 10*3/uL (ref 3.9–10.3)

## 2013-12-19 LAB — COMPREHENSIVE METABOLIC PANEL (CC13)
ALBUMIN: 3.5 g/dL (ref 3.5–5.0)
ALK PHOS: 67 U/L (ref 40–150)
ALT: <6 U/L (ref 0–55)
AST: 12 U/L (ref 5–34)
Anion Gap: 8 mEq/L (ref 3–11)
BUN: 12 mg/dL (ref 7.0–26.0)
CALCIUM: 9.1 mg/dL (ref 8.4–10.4)
CHLORIDE: 107 meq/L (ref 98–109)
CO2: 27 mEq/L (ref 22–29)
Creatinine: 0.8 mg/dL (ref 0.6–1.1)
Glucose: 96 mg/dl (ref 70–140)
POTASSIUM: 4.7 meq/L (ref 3.5–5.1)
SODIUM: 141 meq/L (ref 136–145)
TOTAL PROTEIN: 6.3 g/dL — AB (ref 6.4–8.3)
Total Bilirubin: 0.64 mg/dL (ref 0.20–1.20)

## 2013-12-19 LAB — CANCER ANTIGEN 27.29: CA 27.29: 34 U/mL (ref 0–39)

## 2013-12-19 MED ORDER — IOHEXOL 300 MG/ML  SOLN
50.0000 mL | Freq: Once | INTRAMUSCULAR | Status: AC | PRN
  Administered 2013-12-19: 50 mL via ORAL

## 2013-12-19 MED ORDER — IOHEXOL 300 MG/ML  SOLN
100.0000 mL | Freq: Once | INTRAMUSCULAR | Status: AC | PRN
  Administered 2013-12-19: 80 mL via INTRAVENOUS

## 2013-12-23 ENCOUNTER — Other Ambulatory Visit: Payer: Self-pay | Admitting: Hematology and Oncology

## 2013-12-23 ENCOUNTER — Encounter: Payer: Self-pay | Admitting: Hematology and Oncology

## 2013-12-23 ENCOUNTER — Telehealth: Payer: Self-pay | Admitting: Hematology and Oncology

## 2013-12-23 ENCOUNTER — Ambulatory Visit (HOSPITAL_BASED_OUTPATIENT_CLINIC_OR_DEPARTMENT_OTHER): Payer: Medicare Other | Admitting: Hematology and Oncology

## 2013-12-23 ENCOUNTER — Telehealth: Payer: Self-pay | Admitting: *Deleted

## 2013-12-23 VITALS — BP 135/49 | HR 65 | Temp 96.9°F | Resp 17 | Ht 61.0 in | Wt 125.6 lb

## 2013-12-23 DIAGNOSIS — F17200 Nicotine dependence, unspecified, uncomplicated: Secondary | ICD-10-CM

## 2013-12-23 DIAGNOSIS — J449 Chronic obstructive pulmonary disease, unspecified: Secondary | ICD-10-CM

## 2013-12-23 DIAGNOSIS — R5381 Other malaise: Secondary | ICD-10-CM

## 2013-12-23 DIAGNOSIS — D649 Anemia, unspecified: Secondary | ICD-10-CM

## 2013-12-23 DIAGNOSIS — R634 Abnormal weight loss: Secondary | ICD-10-CM

## 2013-12-23 DIAGNOSIS — R5383 Other fatigue: Secondary | ICD-10-CM

## 2013-12-23 DIAGNOSIS — Z853 Personal history of malignant neoplasm of breast: Secondary | ICD-10-CM

## 2013-12-23 DIAGNOSIS — D539 Nutritional anemia, unspecified: Secondary | ICD-10-CM

## 2013-12-23 DIAGNOSIS — D638 Anemia in other chronic diseases classified elsewhere: Secondary | ICD-10-CM

## 2013-12-23 NOTE — Telephone Encounter (Signed)
Pt scheduled for [facility] at Short Stay on 5/15 at 8 am.  Notified Carter in [place].  Notified daughter of date/time.  Arrive at [facility] at 7 am on 5/15,  NPO after midnight and need driver home.  She verbalized understanding.

## 2013-12-23 NOTE — Telephone Encounter (Signed)
Gave pt appt for MD for 01/07/14

## 2013-12-23 NOTE — Telephone Encounter (Signed)
Message copied by Cathlean Cower on Mon Dec 23, 2013  2:39 PM ------      Message from: [facility], [place]: Mon Dec 23, 2013  2:16 PM      Regarding: sedated BM at [facility]       Either Thursday or Friday this week      If not, I will order that to be done at radiology      Call [place] for details of appt ------

## 2013-12-23 NOTE — Progress Notes (Signed)
Tonya Mckee  [name],[name], PA-C DIAGNOSIS:  #1 Chronic anemia of unknown etiology #2 severe COPD #3 remote history of breast cancer, no evidence of disease SUMMARY OF HEMATOLOGIC HISTORY: Tonya Mckee 80 y.o. female is here because of complaints of profound fatigue and anemia.  She was found to have abnormal CBC from routine blood work. Starting around 2012, she has progressive anemia with hemoglobin and the lowest at 9.1. She denies recent chest pain on exertion but complains of significant shortness of breath on minimal exertion. She has had pre-syncopal episodes and palpitations. She has profound fatigue and takes frequent naps. Recently, she had a fall. Of Mckee, the patient has 60 pack plus year history and recently was placed on oxygen therapy. She never has EGD or screening colonoscopy. She has been taking vitamin B12 supplements for a long time. She has remote history of left breast cancer detected from mammogram. According to the patient, she had lumpectomy followed by radiation followed by tamoxifen. She does not know the stage of disease but she does not think the lymph nodes were involved. The patient was prescribed oral iron supplements and she takes them regularly. INTERVAL HISTORY: Tonya Mckee 80 y.o. female returns for return visit. She had a fall recently due to presyncopal episode. She did not sustain major injuries. She has not lost any weight. She complained of profound fatigue. She is still smoking and is interested to quit  I have reviewed the past medical history, past surgical history, social history and family history with the patient and they are unchanged from previous Mckee.  ALLERGIES:  is allergic to donepezil; penicillins; amoxapine and related; doxycycline; keflex; ketek; levaquin; sulfa antibiotics; and zyrtec.  MEDICATIONS:  Current Outpatient Prescriptions  Medication Sig Dispense Refill  . acetaminophen  (TYLENOL) 325 MG tablet Take 650 mg by mouth every 6 (six) hours as needed.      Marland Kitchen albuterol-ipratropium (COMBIVENT) 18-103 MCG/ACT inhaler Inhale 2 puffs into the lungs every 6 (six) hours as needed for wheezing.      . Cholecalciferol (VITAMIN D PO) Take by mouth daily.      Marland Kitchen FLUoxetine (PROZAC) 10 MG capsule Take 30 mg by mouth daily.      . fluticasone (FLONASE) 50 MCG/ACT nasal spray Place 2 sprays into the nose daily as needed.       Marland Kitchen ibuprofen (ADVIL,MOTRIN) 200 MG tablet Take 200 mg by mouth every 6 (six) hours as needed for pain.      Marland Kitchen LORazepam (ATIVAN) 0.5 MG tablet Take 0.5 mg by mouth every 8 (eight) hours as needed.       . metoprolol succinate (TOPROL-XL) 50 MG 24 hr tablet Take 50 mg by mouth daily. Take with or immediately following a meal.      . ondansetron (ZOFRAN) 4 MG tablet Take 1 tablet (4 mg total) by mouth every 6 (six) hours.  12 tablet  0  . promethazine (PHENERGAN) 25 MG tablet Take 25 mg by mouth every 6 (six) hours as needed for nausea.      . vitamin B-12 (CYANOCOBALAMIN) 1000 MCG tablet Take 1,000 mcg by mouth daily.      Marland Kitchen lisinopril (PRINIVIL,ZESTRIL) 10 MG tablet Take 20 mg by mouth daily.       No current facility-administered medications for this visit.     REVIEW OF SYSTEMS:   Behavioral/Psych: Mood is stable, no new changes  All other systems were reviewed with the patient and are  negative.  PHYSICAL EXAMINATION: ECOG PERFORMANCE STATUS: 3 - Symptomatic, >50% confined to bed  Filed Vitals:   12/23/13 1354  BP: 135/49  Pulse: 65  Temp: 96.9 F (36.1 C)  Resp: 17   Filed Weights   12/23/13 1354  Weight: 125 lb 9.6 oz (56.972 kg)    GENERAL:alert, no distress and comfortable. She looked elderly and pale SKIN: She has some bruising on the skin from recent fall. No petechiae rash. EYES: normal, Conjunctiva are pale and non-injected, sclera clear Musculoskeletal:no cyanosis of digits and no clubbing  NEURO: alert & oriented x 3 with fluent  speech, no focal motor/sensory deficits  LABORATORY DATA:  I have reviewed the data as listed No results found for this or any previous visit (from the past 48 hour(s)).  Lab Results  Component Value Date   WBC 8.5 12/19/2013   HGB 8.6* 12/19/2013   HCT 24.9* 12/19/2013   MCV 98.1 12/19/2013   PLT 413* 12/19/2013    RADIOGRAPHIC STUDIES: I reviewed her most recent CT scan which show no evidence of local disease I have personally reviewed the radiological images as listed and agreed with the findings in the report.  ASSESSMENT & PLAN:  #1 chronic anemia Cause is unknown. I discussed with her the importance of further evaluation. I recommend bone marrow aspirate and biopsy. The patient wants the procedure to be done under sedation. If the blood work showed no evidence of cancer, she would be a candidate for erythropoietin stimulating agents #2 profound fatigue shortness of breath #3 chronic smoking The patient is currently on oxygen replacement. I recommend she quit smoking #4 remote history of breast cancer Tumor markers were negative. She has no evidence of disease #5 history of vitamin B12 deficiency Her most recent B12 was adequately replaced. All questions were answered. The patient knows to call the clinic with any problems, questions or concerns. No barriers to learning was detected.  I spent 25 minutes counseling the patient face to face. The total time spent in the appointment was 30 minutes and more than 50% was on counseling.     Heath Lark, MD 12/23/2013 3:02 PM

## 2013-12-24 ENCOUNTER — Encounter (HOSPITAL_COMMUNITY): Payer: Self-pay | Admitting: Emergency Medicine

## 2013-12-24 ENCOUNTER — Emergency Department (HOSPITAL_COMMUNITY)
Admission: EM | Admit: 2013-12-24 | Discharge: 2013-12-24 | Disposition: A | Discharge status: Home / Self Care | Payer: Medicare Other | Attending: Emergency Medicine | Admitting: Emergency Medicine

## 2013-12-24 ENCOUNTER — Emergency Department (HOSPITAL_COMMUNITY): Payer: Medicare Other

## 2013-12-24 DIAGNOSIS — IMO0002 Reserved for concepts with insufficient information to code with codable children: Secondary | ICD-10-CM | POA: Insufficient documentation

## 2013-12-24 DIAGNOSIS — F3289 Other specified depressive episodes: Secondary | ICD-10-CM | POA: Insufficient documentation

## 2013-12-24 DIAGNOSIS — F17200 Nicotine dependence, unspecified, uncomplicated: Secondary | ICD-10-CM | POA: Insufficient documentation

## 2013-12-24 DIAGNOSIS — J4489 Other specified chronic obstructive pulmonary disease: Secondary | ICD-10-CM | POA: Insufficient documentation

## 2013-12-24 DIAGNOSIS — Z8639 Personal history of other endocrine, nutritional and metabolic disease: Secondary | ICD-10-CM | POA: Insufficient documentation

## 2013-12-24 DIAGNOSIS — Z79899 Other long term (current) drug therapy: Secondary | ICD-10-CM | POA: Insufficient documentation

## 2013-12-24 DIAGNOSIS — M1710 Unilateral primary osteoarthritis, unspecified knee: Secondary | ICD-10-CM | POA: Insufficient documentation

## 2013-12-24 DIAGNOSIS — Z8673 Personal history of transient ischemic attack (TIA), and cerebral infarction without residual deficits: Secondary | ICD-10-CM | POA: Insufficient documentation

## 2013-12-24 DIAGNOSIS — Z8669 Personal history of other diseases of the nervous system and sense organs: Secondary | ICD-10-CM | POA: Insufficient documentation

## 2013-12-24 DIAGNOSIS — F329 Major depressive disorder, single episode, unspecified: Secondary | ICD-10-CM | POA: Insufficient documentation

## 2013-12-24 DIAGNOSIS — F0390 Unspecified dementia without behavioral disturbance: Secondary | ICD-10-CM | POA: Insufficient documentation

## 2013-12-24 DIAGNOSIS — Z7982 Long term (current) use of aspirin: Secondary | ICD-10-CM | POA: Insufficient documentation

## 2013-12-24 DIAGNOSIS — F0781 Postconcussional syndrome: Secondary | ICD-10-CM

## 2013-12-24 DIAGNOSIS — I10 Essential (primary) hypertension: Secondary | ICD-10-CM | POA: Insufficient documentation

## 2013-12-24 DIAGNOSIS — R5381 Other malaise: Secondary | ICD-10-CM | POA: Insufficient documentation

## 2013-12-24 DIAGNOSIS — Z8701 Personal history of pneumonia (recurrent): Secondary | ICD-10-CM | POA: Insufficient documentation

## 2013-12-24 DIAGNOSIS — Z853 Personal history of malignant neoplasm of breast: Secondary | ICD-10-CM | POA: Insufficient documentation

## 2013-12-24 DIAGNOSIS — Z85828 Personal history of other malignant neoplasm of skin: Secondary | ICD-10-CM | POA: Insufficient documentation

## 2013-12-24 DIAGNOSIS — Z862 Personal history of diseases of the blood and blood-forming organs and certain disorders involving the immune mechanism: Secondary | ICD-10-CM | POA: Insufficient documentation

## 2013-12-24 DIAGNOSIS — Z8719 Personal history of other diseases of the digestive system: Secondary | ICD-10-CM | POA: Insufficient documentation

## 2013-12-24 DIAGNOSIS — Z880 Allergy status to penicillin: Secondary | ICD-10-CM | POA: Insufficient documentation

## 2013-12-24 DIAGNOSIS — J449 Chronic obstructive pulmonary disease, unspecified: Secondary | ICD-10-CM | POA: Insufficient documentation

## 2013-12-24 DIAGNOSIS — R5383 Other fatigue: Secondary | ICD-10-CM

## 2013-12-24 LAB — URINALYSIS, ROUTINE W REFLEX MICROSCOPIC
Bilirubin Urine: NEGATIVE
Glucose, UA: NEGATIVE mg/dL
Hgb urine dipstick: NEGATIVE
Ketones, ur: NEGATIVE mg/dL
Leukocytes, UA: NEGATIVE
Nitrite: NEGATIVE
Protein, ur: NEGATIVE mg/dL
Specific Gravity, Urine: 1.012 (ref 1.005–1.030)
Urobilinogen, UA: 1 mg/dL (ref 0.0–1.0)
pH: 6 (ref 5.0–8.0)

## 2013-12-24 LAB — I-STAT CHEM 8, ED
BUN: 14 mg/dL (ref 6–23)
Calcium, Ion: 1.08 mmol/L — ABNORMAL LOW (ref 1.13–1.30)
Chloride: 101 mEq/L (ref 96–112)
Creatinine, Ser: 0.8 mg/dL (ref 0.50–1.10)
Glucose, Bld: 93 mg/dL (ref 70–99)
HCT: 23 % — ABNORMAL LOW (ref 36.0–46.0)
Hemoglobin: 7.8 g/dL — ABNORMAL LOW (ref 12.0–15.0)
Potassium: 4.4 mEq/L (ref 3.7–5.3)
Sodium: 137 mEq/L (ref 137–147)
TCO2: 29 mmol/L (ref 0–100)

## 2013-12-24 MED ORDER — ACETAMINOPHEN 500 MG PO TABS
1000.0000 mg | ORAL_TABLET | Freq: Once | ORAL | Status: AC
  Administered 2013-12-24: 1000 mg via ORAL
  Filled 2013-12-24: qty 2

## 2013-12-24 MED ORDER — IBUPROFEN 800 MG PO TABS
800.0000 mg | ORAL_TABLET | Freq: Once | ORAL | Status: AC
  Administered 2013-12-24: 800 mg via ORAL
  Filled 2013-12-24: qty 1

## 2013-12-24 NOTE — ED Notes (Addendum)
Pt fell 1.5 weeks ago in [facility] on right side, had CT done, normal results with bad bruising. Pt has now been c/o headache and states today has gotten worse. Also c/o weakness.

## 2013-12-24 NOTE — Discharge Instructions (Signed)
Return here as needed.  Followup with your primary care Dr. your CT scan and lab tests were normal.  Other than your anemia

## 2013-12-24 NOTE — ED Notes (Signed)
Pt states she took BC powder and tylenol for headache but it didn't help much. Pt states she is nauseous off and on but has not vomited.

## 2013-12-24 NOTE — ED Provider Notes (Signed)
CSN: [id number]     Arrival date & time 12/24/13  1820 History   First MD Initiated Contact with Patient 12/24/13 1914     Chief Complaint  Patient presents with  . Headache  . Weakness     (Consider location/radiation/quality/duration/timing/severity/associated sxs/prior Treatment) HPI Patient presents to the emergency department with with a headache that has been ongoing since a fall that occurred one week prior.  The patient, states, that she was in [place] when she fell out of bed.  Patient, states, that she did not lose consciousness, and was taken to the emergency department and had a negative CT scans patient, states, that she has not had any nausea, vomiting, diarrhea, weakness, dizziness, blurred vision, neck pain, back pain, abdominal pain, chest pain, shortness of breath, fever, or syncope.  Patient, states she did not take any medications for her headache.  Patient, states her symptoms are persistent.  Nothing seems make her condition, better or worse  Past Medical History  Diagnosis Date  . COPD (chronic obstructive pulmonary disease)   . Hypertension   . PONV (postoperative nausea and vomiting)   . Dysrhythmia     rapid heart rate  . Stroke 2010    mini stroke  . Pneumonia 2009  . Shortness of breath   . GERD (gastroesophageal reflux disease)     long time ago  . Depression   . DEMENTIA     borderline  . Depression 01/02/2012  . Anemia 01/02/2012  . Artery stenosis 10/30/12    Moderate to severe left extracranial vertebral  . Cancer 20years ago+ 1999    lt breast  left  . SCC (squamous cell carcinoma), leg     Right  LE Dr. [place] Crane  . Arthritis     Osteo-  Spine and Knees  . Vitamin B12 deficiency   . Vertigo, benign positional   . Memory loss   . Blepharospasm     Left eye  . Venous stasis ulcers 2012    Left LE due to trauma  . Cellulitis Jan 01, 2012    Left UE  . Carotid artery occlusion     Bruit- RIGHT  . Occlusion and stenosis of carotid artery  without mention of cerebral infarction 11/06/2012   Past Surgical History  Procedure Laterality Date  . Abdominal hysterectomy    . Appendectomy    . Breast surgery    . Joint replacement      Lt arm/wrist plates and screws  . Eye surgery      L/R catarects   Family History  Problem Relation Age of Onset  . Adopted: Yes   History  Substance Use Topics  . Smoking status: Current Every Day Smoker -- 1.00 packs/day for 68 years    Types: Cigarettes  . Smokeless tobacco: Never Used  . Alcohol Use: No   OB History   Grav Para Term Preterm Abortions TAB SAB Ect Mult Living                 Review of Systems  All other systems negative except as documented in the HPI. All pertinent positives and negatives as reviewed in the HPI.  Allergies  Donepezil; Penicillins; Amoxapine and related; Doxycycline; Keflex; Ketek; Levaquin; Sulfa antibiotics; and Zyrtec  Home Medications   Prior to Admission medications   Medication Sig Start Date End Date Taking? Authorizing Provider  acetaminophen (TYLENOL) 325 MG tablet Take 650 mg by mouth every 6 (six) hours as needed (pain).  Yes Historical Provider, MD  albuterol (PROVENTIL HFA;VENTOLIN HFA) 108 (90 BASE) MCG/ACT inhaler Inhale 1-2 puffs into the lungs every 6 (six) hours as needed for wheezing or shortness of breath (wheezing).   Yes Historical Provider, MD  albuterol (PROVENTIL) (2.5 MG/3ML) 0.083% nebulizer solution Take 2.5 mg by nebulization every 6 (six) hours as needed for wheezing or shortness of breath (shortness of breath).   Yes Historical Provider, MD  Aspirin-Salicylamide-Caffeine (BC FAST PAIN RELIEF) 650-195-33.3 MG PACK Take 1 Package by mouth 2 (two) times daily as needed (pain).   Yes Historical Provider, MD  FLUoxetine (PROZAC) 10 MG capsule Take 30 mg by mouth daily.   Yes Historical Provider, MD  fluticasone (FLONASE) 50 MCG/ACT nasal spray Place 2 sprays into the nose daily as needed (allergies).    Yes Historical  Provider, MD  lisinopril (PRINIVIL,ZESTRIL) 10 MG tablet Take 20 mg by mouth daily. 01/03/12 12/24/13 Yes Srikar Janna Arch, MD  LORazepam (ATIVAN) 0.5 MG tablet Take 0.5 mg by mouth every 8 (eight) hours as needed (anxiety).    Yes Historical Provider, MD  metoprolol succinate (TOPROL-XL) 50 MG 24 hr tablet Take 50 mg by mouth daily. Take with or immediately following a meal.   Yes Historical Provider, MD  promethazine (PHENERGAN) 25 MG tablet Take 25 mg by mouth every 6 (six) hours as needed for nausea.   Yes Historical Provider, MD   BP 156/70  Pulse 73  Temp(Src) 98.7 F (37.1 C) (Oral)  Resp 16  SpO2 95% Physical Exam  Nursing note and vitals reviewed. Constitutional: She is oriented to person, place, and time. She appears well-developed and well-nourished. No distress.  HENT:  Head: Normocephalic.    Mouth/Throat: Oropharynx is clear and moist.  Eyes: Conjunctivae and EOM are normal. Pupils are equal, round, and reactive to light.  Neck: Normal range of motion. Neck supple.  Cardiovascular: Normal rate, regular rhythm and normal heart sounds.  Exam reveals no gallop and no friction rub.   No murmur heard. Pulmonary/Chest: Effort normal and breath sounds normal. No respiratory distress. She has no wheezes.  Musculoskeletal:       Cervical back: Normal.       Thoracic back: Normal.       Lumbar back: Normal.  Neurological: She is alert and oriented to person, place, and time. She has normal strength. No sensory deficit. She exhibits normal muscle tone. Coordination and gait normal. GCS eye subscore is 4. GCS verbal subscore is 5. GCS motor subscore is 6.  Skin: Skin is warm and dry.    ED Course  Procedures (including critical care time) Labs Review Labs Reviewed  I-STAT CHEM 8, ED - Abnormal; Notable for the following:    Calcium, Ion 1.08 (*)    Hemoglobin 7.8 (*)    HCT 23.0 (*)    All other components within normal limits  URINE CULTURE  URINALYSIS, ROUTINE W REFLEX  MICROSCOPIC    Imaging Review Ct Head Wo Contrast  12/24/2013   CLINICAL DATA:  Headache.  Fall.  Weakness  EXAM: CT HEAD WITHOUT CONTRAST  TECHNIQUE: Contiguous axial images were obtained from the base of the skull through the vertex without intravenous contrast.  COMPARISON:  CT 10/13/2012  FINDINGS: Mild atrophy and mild chronic microvascular ischemic changes, similar to the prior study.  Negative for acute infarct. Negative for hemorrhage or mass. Calvarium is intact.  IMPRESSION: Atrophy and chronic microvascular ischemia.  No acute abnormality.   Electronically Signed   By: Juanda Crumble  Carlis Abbott M.D.   On: 12/24/2013 20:21     Patient is most likely suffering from postconcussive symptoms, based on her history of present illness.  Physical exam, the CT scan here of her head was negative.  The patient had a normal neurological exam.  Patient is advised to follow up with her primary care Dr. for further evaluation.  Tylenol and Motrin for headache  Brent General, PA-C 12/28/13 [phone]

## 2013-12-25 ENCOUNTER — Ambulatory Visit: Payer: Medicare Other | Admitting: Hematology and Oncology

## 2013-12-25 LAB — URINE CULTURE: Colony Count: 30000

## 2013-12-26 ENCOUNTER — Encounter (HOSPITAL_COMMUNITY): Payer: Self-pay | Admitting: Pharmacy Technician

## 2013-12-26 ENCOUNTER — Telehealth: Payer: Self-pay | Admitting: *Deleted

## 2013-12-26 NOTE — Telephone Encounter (Signed)
Dau left VM requests all labs/ scan reports and office note be sent to pt's PCP,  Tonya Mckee at [facility].  Faxed recent reports and office visit note to Tonya Mckee at fax [phone] as requested.

## 2013-12-27 ENCOUNTER — Ambulatory Visit (HOSPITAL_COMMUNITY)
Admission: RE | Admit: 2013-12-27 | Discharge: 2013-12-27 | Disposition: A | Discharge status: Home / Self Care | Payer: Medicare Other | Source: Ambulatory Visit | Attending: Hematology and Oncology | Admitting: Hematology and Oncology

## 2013-12-27 ENCOUNTER — Other Ambulatory Visit: Payer: Self-pay | Admitting: Hematology and Oncology

## 2013-12-27 ENCOUNTER — Encounter (HOSPITAL_COMMUNITY): Payer: Self-pay

## 2013-12-27 VITALS — BP 145/64 | HR 68 | Temp 97.8°F | Resp 22

## 2013-12-27 DIAGNOSIS — D759 Disease of blood and blood-forming organs, unspecified: Secondary | ICD-10-CM | POA: Insufficient documentation

## 2013-12-27 DIAGNOSIS — D539 Nutritional anemia, unspecified: Secondary | ICD-10-CM

## 2013-12-27 DIAGNOSIS — D649 Anemia, unspecified: Secondary | ICD-10-CM

## 2013-12-27 DIAGNOSIS — D721 Eosinophilia, unspecified: Secondary | ICD-10-CM | POA: Insufficient documentation

## 2013-12-27 LAB — CBC
HCT: 25.9 % — ABNORMAL LOW (ref 36.0–46.0)
Hemoglobin: 8.7 g/dL — ABNORMAL LOW (ref 12.0–15.0)
MCH: 33.3 pg (ref 26.0–34.0)
MCHC: 33.6 g/dL (ref 30.0–36.0)
MCV: 99.2 fL (ref 78.0–100.0)
PLATELETS: 446 10*3/uL — AB (ref 150–400)
RBC: 2.61 MIL/uL — ABNORMAL LOW (ref 3.87–5.11)
RDW: 15.8 % — AB (ref 11.5–15.5)
WBC: 10 10*3/uL (ref 4.0–10.5)

## 2013-12-27 LAB — BONE MARROW EXAM

## 2013-12-27 LAB — ABO/RH: ABO/RH(D): O POS

## 2013-12-27 LAB — IRON AND TIBC
IRON: 84 ug/dL (ref 42–135)
Saturation Ratios: 28 % (ref 20–55)
TIBC: 301 ug/dL (ref 250–470)
UIBC: 217 ug/dL (ref 125–400)

## 2013-12-27 LAB — TYPE AND SCREEN
ABO/RH(D): O POS
ANTIBODY SCREEN: NEGATIVE

## 2013-12-27 LAB — FERRITIN: Ferritin: 149 ng/mL (ref 10–291)

## 2013-12-27 LAB — VITAMIN B12: VITAMIN B 12: 1185 pg/mL — AB (ref 211–911)

## 2013-12-27 MED ORDER — MIDAZOLAM HCL 2 MG/2ML IJ SOLN
INTRAMUSCULAR | Status: AC | PRN
  Administered 2013-12-27: 1 mg via INTRAVENOUS
  Administered 2013-12-27: 3 mg via INTRAVENOUS

## 2013-12-27 MED ORDER — MORPHINE SULFATE 10 MG/ML IJ SOLN
10.0000 mg | Freq: Once | INTRAMUSCULAR | Status: DC
  Filled 2013-12-27: qty 1

## 2013-12-27 MED ORDER — MIDAZOLAM HCL 10 MG/2ML IJ SOLN
10.0000 mg | Freq: Once | INTRAMUSCULAR | Status: DC
  Filled 2013-12-27: qty 2

## 2013-12-27 MED ORDER — SODIUM CHLORIDE 0.9 % IV SOLN
INTRAVENOUS | Status: DC
  Administered 2013-12-27: 10 mL/h via INTRAVENOUS

## 2013-12-27 MED ORDER — MORPHINE SULFATE 10 MG/ML IJ SOLN
INTRAMUSCULAR | Status: AC | PRN
  Administered 2013-12-27: 1 mg via INTRAVENOUS
  Administered 2013-12-27: 2 mg via INTRAVENOUS

## 2013-12-27 NOTE — Discharge Instructions (Signed)
Conscious Sedation, Adult, Care After °Refer to this sheet in the next few weeks. These instructions provide you with information on caring for yourself after your procedure. Your health care provider may also give you more specific instructions. Your treatment has been planned according to current medical practices, but problems sometimes occur. Call your health care provider if you have any problems or questions after your procedure. °WHAT TO EXPECT AFTER THE PROCEDURE  °After your procedure: °· You may feel sleepy, clumsy, and have poor balance for several hours. °· Vomiting may occur if you eat too soon after the procedure. °HOME CARE INSTRUCTIONS °· Do not participate in any activities where you could become injured for at least 24 hours. Do not: °· Drive. °· Swim. °· Ride a bicycle. °· Operate heavy machinery. °· Cook. °· Use power tools. °· Climb ladders. °· Work from a high place. °· Do not make important decisions or sign legal documents until you are improved. °· If you vomit, drink water, juice, or soup when you can drink without vomiting. Make sure you have little or no nausea before eating solid foods. °· Only take over-the-counter or prescription medicines for pain, discomfort, or fever as directed by your health care provider. °· Make sure you and your family fully understand everything about the medicines given to you, including what side effects may occur. °· You should not drink alcohol, take sleeping pills, or take medicines that cause drowsiness for at least 24 hours. °· If you smoke, do not smoke without supervision. °· If you are feeling better, you may resume normal activities 24 hours after you were sedated. °· Keep all appointments with your health care provider. °SEEK MEDICAL CARE IF: °· Your skin is pale or bluish in color. °· You continue to feel nauseous or vomit. °· Your pain is getting worse and is not helped by medicine. °· You have bleeding or swelling. °· You are still sleepy or  feeling clumsy after 24 hours. °SEEK IMMEDIATE MEDICAL CARE IF: °· You develop a rash. °· You have difficulty breathing. °· You develop any type of allergic problem. °· You have a fever. °MAKE SURE YOU: °· Understand these instructions. °· Will watch your condition. °· Will get help right away if you are not doing well or get worse. °Document Released: 05/22/2013 Document Reviewed: 03/08/2013 °ExitCare® Patient Information ©2014 [employer]. ° °Bone Biopsy, Needle, Care After °Read the instructions outlined below and refer to this sheet in the next few weeks. These discharge instructions provide you with general information on caring for yourself after you leave the hospital. Your caregiver may also give you specific instructions. While your treatment has been planned according to the most current medical practices available, unavoidable complications sometimes occur. If you have any problems or questions after discharge, call your caregiver. °Finding out the results of your test °Not all test results are available during your visit. If your test results are not back during the visit, make an appointment with your caregiver to find out the results. Do not assume everything is normal if you have not heard from your caregiver or the medical facility. It is important for you to follow up on all of your test results.  °SEEK MEDICAL CARE IF:  °· You have redness, swelling, or increasing pain at the site of the biopsy. °· You have pus coming from the biopsy site. °· You have drainage from the biopsy site lasting longer than 1 day. °· You notice a bad smell coming   from the biopsy site or dressing. °· You develop persistent nausea or vomiting. °SEEK IMMEDIATE MEDICAL CARE IF: °· You have a fever. °· You develop a rash. °· You have difficulty breathing. °· You develop any reaction or side effects to medicines given. °Document Released: 02/18/2005 Document Revised: 05/22/2013 Document Reviewed: 01/06/2009 °ExitCare® Patient  Information ©2014 [employer]. ° ° °

## 2013-12-27 NOTE — Procedures (Signed)
Brief examination was performed. ENT: adequate airway clearance Heart: regular rate and rhythm.No Murmurs Lungs: clear to auscultation, no wheezes, normal respiratory effort  American Society of Anesthesiologists ASA scale 3  Mallampati Score of 4  Bone Marrow Biopsy and Aspiration Procedure Note   Informed consent was obtained and potential risks including bleeding, infection and pain were reviewed with the patient. I verified that the patient has been fasting since midnight.  The patient's name, date of birth, identification, consent and allergies were verified prior to the start of procedure and time out was performed.  A total of 4 mg of IV Versed and 3 mg of IV morphine were given.  The right posterior iliac crest was chosen as the site of biopsy.  The skin was prepped with Betadine solution.   6 cc of 1% lidocaine was used to provide local anaesthesia.   10 cc of bone marrow aspirate was obtained followed by 1 inch biopsy.   The procedure was tolerated well and there were no complications.  The patient was stable at the end of the procedure.  Specimens sent for flow cytometry, cytogenetics and additional studies.  

## 2013-12-27 NOTE — Sedation Documentation (Signed)
Family updated as to patient's status.

## 2013-12-27 NOTE — Sedation Documentation (Signed)
Patient is resting comfortably. 

## 2013-12-27 NOTE — Sedation Documentation (Signed)
MD at bedside. 

## 2013-12-29 NOTE — ED Provider Notes (Signed)
Medical screening examination/treatment/procedure(s) were conducted as a shared visit with non-physician practitioner(s) and myself.  I personally evaluated the patient during the encounter.   EKG Interpretation None      Pt presents with headache in setting of recent fall with head injury.  Also has generalized fatigue with ongoing workup for anemia (has a bone marrow biopsy scheduled).  On exam, well appearing, nontoxic, not distressed, sitting upright in chair, alert and oriented, able to ambulate well without assistance.  CT repeated and negative.  Plan dc with outpatient follow up.  Clinical Impression: 1. Post concussive syndrome       Houston Siren III, MD 12/29/13 [phone]

## 2013-12-30 ENCOUNTER — Other Ambulatory Visit: Payer: Self-pay | Admitting: Hematology and Oncology

## 2013-12-30 ENCOUNTER — Telehealth: Payer: Self-pay | Admitting: Hematology and Oncology

## 2013-12-30 ENCOUNTER — Telehealth: Payer: Self-pay | Admitting: *Deleted

## 2013-12-30 DIAGNOSIS — D649 Anemia, unspecified: Secondary | ICD-10-CM

## 2013-12-30 LAB — ERYTHROPOIETIN: Erythropoietin: 20 m[IU]/mL — ABNORMAL HIGH (ref 2.6–18.5)

## 2013-12-30 NOTE — Telephone Encounter (Signed)
Dau concerned about pt's condition.  States pt c/o ongoing severe headaches since suffering a fall over a week ago.  Has been seen in ED twice for this and they told her it was a concussion and headaches can be common.  Pt taking tylenol and ibuprofen w/o good relief.  Pt also c/o feeling extremely tired and weak. Dau says she is "miserable."  Asks if pt can be seen any sooner than 5/26 as scheduled?  Also dau has [employer] on 5/26 so would not be able to bring pt on 5/26.   Dau asks if anything can be done to help her mother feel better?

## 2013-12-30 NOTE — Telephone Encounter (Signed)
I placed POF to move appt to wednesday

## 2013-12-30 NOTE — Telephone Encounter (Signed)
s.w. daughter and advised on 5.20 a0ppt....ok and aware

## 2014-01-01 ENCOUNTER — Other Ambulatory Visit (HOSPITAL_BASED_OUTPATIENT_CLINIC_OR_DEPARTMENT_OTHER): Payer: Medicare Other

## 2014-01-01 ENCOUNTER — Ambulatory Visit (HOSPITAL_BASED_OUTPATIENT_CLINIC_OR_DEPARTMENT_OTHER): Payer: Medicare Other

## 2014-01-01 ENCOUNTER — Ambulatory Visit (HOSPITAL_BASED_OUTPATIENT_CLINIC_OR_DEPARTMENT_OTHER): Payer: Medicare Other | Admitting: Hematology and Oncology

## 2014-01-01 ENCOUNTER — Telehealth: Payer: Self-pay | Admitting: Hematology and Oncology

## 2014-01-01 ENCOUNTER — Encounter: Payer: Self-pay | Admitting: Hematology and Oncology

## 2014-01-01 ENCOUNTER — Ambulatory Visit (HOSPITAL_COMMUNITY)
Admission: RE | Admit: 2014-01-01 | Discharge: 2014-01-01 | Disposition: A | Discharge status: Home / Self Care | Payer: Medicare Other | Source: Ambulatory Visit | Attending: Hematology and Oncology | Admitting: Hematology and Oncology

## 2014-01-01 VITALS — BP 156/70 | HR 65 | Temp 97.8°F | Resp 18

## 2014-01-01 VITALS — BP 115/63 | HR 56 | Temp 97.7°F | Resp 20 | Ht 61.0 in | Wt 124.6 lb

## 2014-01-01 DIAGNOSIS — R0602 Shortness of breath: Secondary | ICD-10-CM

## 2014-01-01 DIAGNOSIS — D649 Anemia, unspecified: Secondary | ICD-10-CM

## 2014-01-01 DIAGNOSIS — G9332 Myalgic encephalomyelitis/chronic fatigue syndrome: Secondary | ICD-10-CM

## 2014-01-01 DIAGNOSIS — R5382 Chronic fatigue, unspecified: Secondary | ICD-10-CM

## 2014-01-01 DIAGNOSIS — F17200 Nicotine dependence, unspecified, uncomplicated: Secondary | ICD-10-CM

## 2014-01-01 LAB — CBC WITH DIFFERENTIAL/PLATELET
BASO%: 2.4 % — ABNORMAL HIGH (ref 0.0–2.0)
Basophils Absolute: 0.2 10*3/uL — ABNORMAL HIGH (ref 0.0–0.1)
EOS ABS: 0.6 10*3/uL — AB (ref 0.0–0.5)
EOS%: 6.5 % (ref 0.0–7.0)
HEMATOCRIT: 23.1 % — AB (ref 34.8–46.6)
HEMOGLOBIN: 7.9 g/dL — AB (ref 11.6–15.9)
LYMPH%: 30.9 % (ref 14.0–49.7)
MCH: 33.8 pg (ref 25.1–34.0)
MCHC: 34 g/dL (ref 31.5–36.0)
MCV: 99.4 fL (ref 79.5–101.0)
MONO#: 1.5 10*3/uL — AB (ref 0.1–0.9)
MONO%: 16 % — ABNORMAL HIGH (ref 0.0–14.0)
NEUT#: 4.2 10*3/uL (ref 1.5–6.5)
NEUT%: 44.2 % (ref 38.4–76.8)
Platelets: 425 10*3/uL — ABNORMAL HIGH (ref 145–400)
RBC: 2.33 10*6/uL — ABNORMAL LOW (ref 3.70–5.45)
RDW: 16.1 % — ABNORMAL HIGH (ref 11.2–14.5)
WBC: 9.5 10*3/uL (ref 3.9–10.3)
lymph#: 2.9 10*3/uL (ref 0.9–3.3)

## 2014-01-01 LAB — PREPARE RBC (CROSSMATCH)

## 2014-01-01 LAB — HOLD TUBE, BLOOD BANK

## 2014-01-01 MED ORDER — SODIUM CHLORIDE 0.9 % IV SOLN
250.0000 mL | Freq: Once | INTRAVENOUS | Status: AC
  Administered 2014-01-01: 250 mL via INTRAVENOUS

## 2014-01-01 NOTE — Progress Notes (Signed)
[facility] OFFICE PROGRESS NOTE  Tonya Mckee,[name], PA-C DIAGNOSIS:  Hypoproliferative anemia  SUMMARY OF HEMATOLOGIC HISTORY:  She was found to have abnormal CBC from routine blood work. Starting around 2012, she has progressive anemia with hemoglobin and the lowest at 9.1. She denies recent chest pain on exertion but complains of significant shortness of breath on minimal exertion. She has had pre-syncopal episodes and palpitations. She has profound fatigue and takes frequent naps. Recently, she had a fall. Of note, the patient has 60 pack plus year history and recently was placed on oxygen therapy. She never has EGD or screening colonoscopy. She has been taking vitamin B12 supplements for a long time. She has remote history of left breast cancer detected from mammogram. According to the patient, she had lumpectomy followed by radiation followed by tamoxifen. She does not know the stage of disease but she does not think the lymph nodes were involved. The patient was prescribed oral iron supplements and she takes them regularly. On 12/27/13: Bone marrow biopsy did not show evidence of leukemia or metastatic cancer. Cytogenetics are pending. INTERVAL HISTORY: Tonya Mckee 80 y.o. female returns for urgent evaluation today. Her daughter stated that she is very dizzy, weak, exhausted, with profound shortness of breath and headaches. She is attempting to quit smoking.  I have reviewed the past medical history, past surgical history, social history and family history with the patient and they are unchanged from previous note.  ALLERGIES:  is allergic to donepezil; penicillins; amoxapine and related; doxycycline; keflex; ketek; levaquin; sulfa antibiotics; and zyrtec.  MEDICATIONS:  Current Outpatient Prescriptions  Medication Sig Dispense Refill  . acetaminophen (TYLENOL) 325 MG tablet Take 650 mg by mouth every 6 (six) hours as needed (pain).       Marland Kitchen albuterol (PROVENTIL  HFA;VENTOLIN HFA) 108 (90 BASE) MCG/ACT inhaler Inhale 1-2 puffs into the lungs every 6 (six) hours as needed for wheezing or shortness of breath (wheezing).      Marland Kitchen albuterol (PROVENTIL) (2.5 MG/3ML) 0.083% nebulizer solution Take 2.5 mg by nebulization every 6 (six) hours.       . Aspirin-Salicylamide-Caffeine (BC FAST PAIN RELIEF) 650-195-33.3 MG PACK Take 1 Package by mouth 2 (two) times daily as needed (pain).      Marland Kitchen FLUoxetine (PROZAC) 10 MG capsule Take 30 mg by mouth every morning.       . fluticasone (FLONASE) 50 MCG/ACT nasal spray Place 2 sprays into the nose daily as needed (allergies).       Marland Kitchen ibuprofen (ADVIL,MOTRIN) 200 MG tablet Take 400 mg by mouth every 6 (six) hours as needed for moderate pain.      Marland Kitchen LORazepam (ATIVAN) 0.5 MG tablet Take 0.5 mg by mouth every 8 (eight) hours as needed (anxiety).       . metoprolol succinate (TOPROL-XL) 50 MG 24 hr tablet Take 50 mg by mouth every evening. Take with or immediately following a meal.      . promethazine (PHENERGAN) 25 MG tablet Take 25 mg by mouth every 6 (six) hours as needed for nausea.      Marland Kitchen lisinopril (PRINIVIL,ZESTRIL) 10 MG tablet Take 20 mg by mouth every morning.        No current facility-administered medications for this visit.     REVIEW OF SYSTEMS:   All other systems were reviewed with the patient and are negative.  PHYSICAL EXAMINATION: ECOG PERFORMANCE STATUS: 3 - Symptomatic, >50% confined to bed  Filed Vitals:   01/01/14 1233  BP: 115/63  Pulse: 56  Temp: 97.7 F (36.5 C)  Resp: 20   Filed Weights   01/01/14 1233  Weight: 124 lb 9.6 oz (56.518 kg)    GENERAL:alert, no distress and comfortable. She looked pale SKIN: skin color is pale, texture, turgor are normal, no rashes or significant lesions EYES: normal, Conjunctiva are pale and non-injected, sclera clear Musculoskeletal:no cyanosis of digits and no clubbing  NEURO: alert & oriented x 3 with fluent speech, no focal motor/sensory  deficits  LABORATORY DATA:  I have reviewed the data as listed Results for orders placed in visit on 01/01/14 (from the past 48 hour(s))  CBC WITH DIFFERENTIAL     Status: Abnormal   Collection Time    01/01/14 12:05 PM      Result Value Ref Range   WBC 9.5  3.9 - 10.3 10e3/uL   NEUT# 4.2  1.5 - 6.5 10e3/uL   HGB 7.9 (*) 11.6 - 15.9 g/dL   HCT 23.1 (*) 34.8 - 46.6 %   Platelets 425 (*) 145 - 400 10e3/uL   MCV 99.4  79.5 - 101.0 fL   MCH 33.8  25.1 - 34.0 pg   MCHC 34.0  31.5 - 36.0 g/dL   RBC 2.33 (*) 3.70 - 5.45 10e6/uL   RDW 16.1 (*) 11.2 - 14.5 %   lymph# 2.9  0.9 - 3.3 10e3/uL   MONO# 1.5 (*) 0.1 - 0.9 10e3/uL   Eosinophils Absolute 0.6 (*) 0.0 - 0.5 10e3/uL   Basophils Absolute 0.2 (*) 0.0 - 0.1 10e3/uL   NEUT% 44.2  38.4 - 76.8 %   LYMPH% 30.9  14.0 - 49.7 %   MONO% 16.0 (*) 0.0 - 14.0 %   EOS% 6.5  0.0 - 7.0 %   BASO% 2.4 (*) 0.0 - 2.0 %    Lab Results  Component Value Date   WBC 9.5 01/01/2014   HGB 7.9* 01/01/2014   HCT 23.1* 01/01/2014   MCV 99.4 01/01/2014   PLT 425* 01/01/2014   ASSESSMENT & PLAN:  #1 chronic anemia Cause is unknown. Final report from bone marrow biopsy is pending but preliminary results do not suggest evidence of metastatic cancer or leukemia. The patient is profoundly anemic. We discussed some of the risks, benefits, and alternatives of blood transfusions. The patient is symptomatic from anemia and the hemoglobin level is critically low.  Some of the side-effects to be expected including risks of transfusion reactions, chills, infection, syndrome of volume overload and risk of hospitalization from various reasons and the patient is willing to proceed and went ahead to sign consent today. I recommend giving her one unit of blood today and one unit of blood tomorrow. I also discussed with her the use of erythropoietin stimulating agents. We discussed some of the risks, benefits and side effects of erythropoietin stimulating agents including risk of  nausea, hypertension, risk of thrombosis and cancer growth. She agreed to proceed. I will recommend she comes back every 2 weeks with blood work and injection with Aranesp at 300 mcg every 2 weeks to bring her hemoglobin greater than 10 g. She agreed to proceed. #2 profound fatigue shortness of breath #3 chronic smoking The patient is currently on oxygen replacement. I recommend she quit smoking #4 remote history of breast cancer Tumor markers were negative. She has no evidence of disease #5 history of vitamin B12 deficiency Her most recent B12 was adequately replaced. All questions were answered. The patient knows to call the clinic with  any problems, questions or concerns. No barriers to learning was detected.   I spent 40 minutes counseling the patient face to face. The total time spent in the appointment was 55 minutes and more than 50% was on counseling.     Heath Lark, MD 01/01/2014 4:10 PM

## 2014-01-01 NOTE — Patient Instructions (Signed)
Blood Transfusion Information WHAT IS A BLOOD TRANSFUSION? A transfusion is the replacement of blood or some of its parts. Blood is made up of multiple cells which provide different functions.  Red blood cells carry oxygen and are used for blood loss replacement.  White blood cells fight against infection.  Platelets control bleeding.  Plasma helps clot blood.  Other blood products are available for specialized needs, such as hemophilia or other clotting disorders. BEFORE THE TRANSFUSION  Who gives blood for transfusions?   You may be able to donate blood to be used at a later date on yourself (autologous donation).  Relatives can be asked to donate blood. This is generally not any safer than if you have received blood from a stranger. The same precautions are taken to ensure safety when a relative's blood is donated.  Healthy volunteers who are fully evaluated to make sure their blood is safe. This is blood bank blood. Transfusion therapy is the safest it has ever been in the practice of medicine. Before blood is taken from a donor, a complete history is taken to make sure that person has no history of diseases nor engages in risky social behavior (examples are intravenous drug use or sexual activity with multiple partners). The donor's travel history is screened to minimize risk of transmitting infections, such as malaria. The donated blood is tested for signs of infectious diseases, such as HIV and hepatitis. The blood is then tested to be sure it is compatible with you in order to minimize the chance of a transfusion reaction. If you or a relative donates blood, this is often done in anticipation of surgery and is not appropriate for emergency situations. It takes many days to process the donated blood. RISKS AND COMPLICATIONS Although transfusion therapy is very safe and saves many lives, the main dangers of transfusion include:   Getting an infectious disease.  Developing a  transfusion reaction. This is an allergic reaction to something in the blood you were given. Every precaution is taken to prevent this. The decision to have a blood transfusion has been considered carefully by your caregiver before blood is given. Blood is not given unless the benefits outweigh the risks. AFTER THE TRANSFUSION  Right after receiving a blood transfusion, you will usually feel much better and more energetic. This is especially true if your red blood cells have gotten low (anemic). The transfusion raises the level of the red blood cells which carry oxygen, and this usually causes an energy increase.  The nurse administering the transfusion will monitor you carefully for complications. HOME CARE INSTRUCTIONS  No special instructions are needed after a transfusion. You may find your energy is better. Speak with your caregiver about any limitations on activity for underlying diseases you may have. SEEK MEDICAL CARE IF:   Your condition is not improving after your transfusion.  You develop redness or irritation at the intravenous (IV) site. SEEK IMMEDIATE MEDICAL CARE IF:  Any of the following symptoms occur over the next 12 hours:  Shaking chills.  You have a temperature by mouth above 102 F (38.9 C), not controlled by medicine.  Chest, back, or muscle pain.  People around you feel you are not acting correctly or are confused.  Shortness of breath or difficulty breathing.  Dizziness and fainting.  You get a rash or develop hives.  You have a decrease in urine output.  Your urine turns a dark color or changes to pink, red, or brown. Any of the following   symptoms occur over the next 10 days:  You have a temperature by mouth above 102 F (38.9 C), not controlled by medicine.  Shortness of breath.  Weakness after normal activity.  The white part of the eye turns yellow (jaundice).  You have a decrease in the amount of urine or are urinating less often.  Your  urine turns a dark color or changes to pink, red, or brown. Document Released: 07/29/2000 Document Revised: 10/24/2011 Document Reviewed: 03/17/2008 [employer] Patient Information 2014 [employer].  

## 2014-01-01 NOTE — Telephone Encounter (Signed)
gv adn printed appts ched and avs for pt fro May and June....MW added tx.

## 2014-01-02 ENCOUNTER — Ambulatory Visit (HOSPITAL_BASED_OUTPATIENT_CLINIC_OR_DEPARTMENT_OTHER): Payer: Medicare Other

## 2014-01-02 ENCOUNTER — Ambulatory Visit: Payer: Medicare Other

## 2014-01-02 VITALS — BP 159/73 | HR 68 | Temp 97.2°F | Resp 20

## 2014-01-02 DIAGNOSIS — D46C Myelodysplastic syndrome with isolated del(5q) chromosomal abnormality: Secondary | ICD-10-CM

## 2014-01-02 DIAGNOSIS — D649 Anemia, unspecified: Secondary | ICD-10-CM

## 2014-01-02 LAB — PREPARE RBC (CROSSMATCH)

## 2014-01-02 MED ORDER — SODIUM CHLORIDE 0.9 % IV SOLN
250.0000 mL | Freq: Once | INTRAVENOUS | Status: AC
  Administered 2014-01-02: 250 mL via INTRAVENOUS

## 2014-01-02 MED ORDER — DARBEPOETIN ALFA-POLYSORBATE 300 MCG/0.6ML IJ SOLN
300.0000 ug | Freq: Once | INTRAMUSCULAR | Status: AC
  Administered 2014-01-02: 300 ug via SUBCUTANEOUS
  Filled 2014-01-02: qty 0.6

## 2014-01-02 NOTE — Patient Instructions (Addendum)
Blood Transfusion Information WHAT IS A BLOOD TRANSFUSION? A transfusion is the replacement of blood or some of its parts. Blood is made up of multiple cells which provide different functions.  Red blood cells carry oxygen and are used for blood loss replacement.  White blood cells fight against infection.  Platelets control bleeding.  Plasma helps clot blood.  Other blood products are available for specialized needs, such as hemophilia or other clotting disorders. BEFORE THE TRANSFUSION  Who gives blood for transfusions?   You may be able to donate blood to be used at a later date on yourself (autologous donation).  Relatives can be asked to donate blood. This is generally not any safer than if you have received blood from a stranger. The same precautions are taken to ensure safety when a relative's blood is donated.  Healthy volunteers who are fully evaluated to make sure their blood is safe. This is blood bank blood. Transfusion therapy is the safest it has ever been in the practice of medicine. Before blood is taken from a donor, a complete history is taken to make sure that person has no history of diseases nor engages in risky social behavior (examples are intravenous drug use or sexual activity with multiple partners). The donor's travel history is screened to minimize risk of transmitting infections, such as malaria. The donated blood is tested for signs of infectious diseases, such as HIV and hepatitis. The blood is then tested to be sure it is compatible with you in order to minimize the chance of a transfusion reaction. If you or a relative donates blood, this is often done in anticipation of surgery and is not appropriate for emergency situations. It takes many days to process the donated blood. RISKS AND COMPLICATIONS Although transfusion therapy is very safe and saves many lives, the main dangers of transfusion include:   Getting an infectious disease.  Developing a  transfusion reaction. This is an allergic reaction to something in the blood you were given. Every precaution is taken to prevent this. The decision to have a blood transfusion has been considered carefully by your caregiver before blood is given. Blood is not given unless the benefits outweigh the risks. AFTER THE TRANSFUSION  Right after receiving a blood transfusion, you will usually feel much better and more energetic. This is especially true if your red blood cells have gotten low (anemic). The transfusion raises the level of the red blood cells which carry oxygen, and this usually causes an energy increase.  The nurse administering the transfusion will monitor you carefully for complications. HOME CARE INSTRUCTIONS  No special instructions are needed after a transfusion. You may find your energy is better. Speak with your caregiver about any limitations on activity for underlying diseases you may have. SEEK MEDICAL CARE IF:   Your condition is not improving after your transfusion.  You develop redness or irritation at the intravenous (IV) site. SEEK IMMEDIATE MEDICAL CARE IF:  Any of the following symptoms occur over the next 12 hours:  Shaking chills.  You have a temperature by mouth above 102 F (38.9 C), not controlled by medicine.  Chest, back, or muscle pain.  People around you feel you are not acting correctly or are confused.  Shortness of breath or difficulty breathing.  Dizziness and fainting.  You get a rash or develop hives.  You have a decrease in urine output.  Your urine turns a dark color or changes to pink, red, or brown. Any of the following   symptoms occur over the next 10 days:  You have a temperature by mouth above 102 F (38.9 C), not controlled by medicine.  Shortness of breath.  Weakness after normal activity.  The white part of the eye turns yellow (jaundice).  You have a decrease in the amount of urine or are urinating less often.  Your  urine turns a dark color or changes to pink, red, or brown. Document Released: 07/29/2000 Document Revised: 10/24/2011 Document Reviewed: 03/17/2008 [facility] Patient Information 2014 [place].   Darbepoetin Alfa injection (Aranesp) What is this medicine? DARBEPOETIN ALFA (dar be POE e tin AL fa) helps your body make more red blood cells. It is used to treat anemia caused by chronic kidney failure and chemotherapy. This medicine may be used for other purposes; ask your health care provider or pharmacist if you have questions. COMMON BRAND NAME(S): Aranesp What should I tell my health care provider before I take this medicine? They need to know if you have any of these conditions: -blood clotting disorders or history of blood clots -cancer patient not on chemotherapy -cystic fibrosis -heart disease, such as angina, heart failure, or a history of a heart attack -hemoglobin level of 12 g/dL or greater -high blood pressure -low levels of folate, iron, or vitamin B12 -seizures -an unusual or allergic reaction to darbepoetin, erythropoietin, albumin, hamster proteins, latex, other medicines, foods, dyes, or preservatives -pregnant or trying to get pregnant -breast-feeding How should I use this medicine? This medicine is for injection into a vein or under the skin. It is usually given by a health care professional in a hospital or clinic setting. If you get this medicine at home, you will be taught how to prepare and give this medicine. Do not shake the solution before you withdraw a dose. Use exactly as directed. Take your medicine at regular intervals. Do not take your medicine more often than directed. It is important that you put your used needles and syringes in a special sharps container. Do not put them in a trash can. If you do not have a sharps container, call your pharmacist or healthcare provider to get one. Talk to your pediatrician regarding the use of this medicine in  children. While this medicine may be used in children as young as 1 year for selected conditions, precautions do apply. Overdosage: If you think you have taken too much of this medicine contact a poison control center or emergency room at once. NOTE: This medicine is only for you. Do not share this medicine with others. What if I miss a dose? If you miss a dose, take it as soon as you can. If it is almost time for your next dose, take only that dose. Do not take double or extra doses. What may interact with this medicine? Do not take this medicine with any of the following medications: -epoetin alfa This list may not describe all possible interactions. Give your health care provider a list of all the medicines, herbs, non-prescription drugs, or dietary supplements you use. Also tell them if you smoke, drink alcohol, or use illegal drugs. Some items may interact with your medicine. What should I watch for while using this medicine? Visit your prescriber or health care professional for regular checks on your progress and for the needed blood tests and blood pressure measurements. It is especially important for the doctor to make sure your hemoglobin level is in the desired range, to limit the risk of potential side effects and to give  you the best benefit. Keep all appointments for any recommended tests. Check your blood pressure as directed. Ask your doctor what your blood pressure should be and when you should contact him or her. As your body makes more red blood cells, you may need to take iron, folic acid, or vitamin B supplements. Ask your doctor or health care provider which products are right for you. If you have kidney disease continue dietary restrictions, even though this medication can make you feel better. Talk with your doctor or health care professional about the foods you eat and the vitamins that you take. What side effects may I notice from receiving this medicine? Side effects that you  should report to your doctor or health care professional as soon as possible: -allergic reactions like skin rash, itching or hives, swelling of the face, lips, or tongue -breathing problems -changes in vision -chest pain -confusion, trouble speaking or understanding -feeling faint or lightheaded, falls -high blood pressure -muscle aches or pains -pain, swelling, warmth in the leg -rapid weight gain -severe headaches -sudden numbness or weakness of the face, arm or leg -trouble walking, dizziness, loss of balance or coordination -seizures (convulsions) -swelling of the ankles, feet, hands -unusually weak or tired Side effects that usually do not require medical attention (report to your doctor or health care professional if they continue or are bothersome): -diarrhea -fever, chills (flu-like symptoms) -headaches -nausea, vomiting -redness, stinging, or swelling at site where injected This list may not describe all possible side effects. Call your doctor for medical advice about side effects. You may report side effects to FDA at 1-800-FDA-1088. Where should I keep my medicine? Keep out of the reach of children. Store in a refrigerator between 2 and 8 degrees C (36 and 46 degrees F). Do not freeze. Do not shake. Throw away any unused portion if using a single-dose vial. Throw away any unused medicine after the expiration date. NOTE: This sheet is a summary. It may not cover all possible information. If you have questions about this medicine, talk to your doctor, pharmacist, or health care provider.  2014, [employer]. (2008-07-15 10:23:57)

## 2014-01-03 ENCOUNTER — Other Ambulatory Visit: Payer: Self-pay | Admitting: Hematology and Oncology

## 2014-01-03 ENCOUNTER — Telehealth: Payer: Self-pay | Admitting: Hematology and Oncology

## 2014-01-03 LAB — TYPE AND SCREEN
ABO/RH(D): O POS
ANTIBODY SCREEN: NEGATIVE
Unit division: 0
Unit division: 0

## 2014-01-03 NOTE — Telephone Encounter (Signed)
I spoke with the patient's daughter. Her breathing has improved significantly since blood transfusion. 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 The patient may benefit from Revlimid in the future. We'll consent the patient in the next clinic visit.

## 2014-01-07 ENCOUNTER — Ambulatory Visit: Payer: Medicare Other | Admitting: Hematology and Oncology

## 2014-01-07 LAB — CHROMOSOME ANALYSIS, BONE MARROW

## 2014-01-07 LAB — TISSUE HYBRIDIZATION (BONE MARROW)-NCBH

## 2014-01-08 ENCOUNTER — Telehealth: Payer: Self-pay | Admitting: *Deleted

## 2014-01-08 NOTE — Telephone Encounter (Signed)
Yes, for now

## 2014-01-08 NOTE — Telephone Encounter (Signed)
Dau left VM states Dr. Alvy Bimler suggested pt try a pill instead of injections.  She asks if pt should keep her lab/injection appt as scheduled on 6/04? Pt doesn't see Dr. Alvy Bimler again until 6/18.

## 2014-01-09 NOTE — Telephone Encounter (Signed)
Left VM for daughter instructing for pt to keep appt for lab/injection on 6/04 as scheduled.  Dr. Alvy Bimler will discuss the pill option on next visit 6/18. Asked her to call back if any questions.

## 2014-01-13 ENCOUNTER — Telehealth: Payer: Self-pay | Admitting: Hematology and Oncology

## 2014-01-16 ENCOUNTER — Ambulatory Visit: Payer: Medicare Other

## 2014-01-16 ENCOUNTER — Other Ambulatory Visit: Payer: Medicare Other

## 2014-01-17 ENCOUNTER — Other Ambulatory Visit (HOSPITAL_BASED_OUTPATIENT_CLINIC_OR_DEPARTMENT_OTHER): Payer: Medicare Other

## 2014-01-17 ENCOUNTER — Ambulatory Visit: Payer: Medicare Other

## 2014-01-17 DIAGNOSIS — D649 Anemia, unspecified: Secondary | ICD-10-CM

## 2014-01-17 DIAGNOSIS — D638 Anemia in other chronic diseases classified elsewhere: Secondary | ICD-10-CM

## 2014-01-17 LAB — CBC & DIFF AND RETIC
BASO%: 0.3 % (ref 0.0–2.0)
BASOS ABS: 0 10*3/uL (ref 0.0–0.1)
EOS%: 0 % (ref 0.0–7.0)
Eosinophils Absolute: 0 10*3/uL (ref 0.0–0.5)
HCT: 32.2 % — ABNORMAL LOW (ref 34.8–46.6)
HGB: 10.8 g/dL — ABNORMAL LOW (ref 11.6–15.9)
IMMATURE RETIC FRACT: 1.2 % — AB (ref 1.60–10.00)
LYMPH#: 1.3 10*3/uL (ref 0.9–3.3)
LYMPH%: 16.6 % (ref 14.0–49.7)
MCH: 31.8 pg (ref 25.1–34.0)
MCHC: 33.5 g/dL (ref 31.5–36.0)
MCV: 94.7 fL (ref 79.5–101.0)
MONO#: 0.6 10*3/uL (ref 0.1–0.9)
MONO%: 8 % (ref 0.0–14.0)
NEUT%: 75.1 % (ref 38.4–76.8)
NEUTROS ABS: 5.7 10*3/uL (ref 1.5–6.5)
Platelets: 302 10*3/uL (ref 145–400)
RBC: 3.4 10*6/uL — ABNORMAL LOW (ref 3.70–5.45)
RDW: 17 % — AB (ref 11.2–14.5)
RETIC %: 1.09 % (ref 0.70–2.10)
RETIC CT ABS: 37.06 10*3/uL (ref 33.70–90.70)
WBC: 7.5 10*3/uL (ref 3.9–10.3)

## 2014-01-17 LAB — HOLD TUBE, BLOOD BANK

## 2014-01-17 MED ORDER — DARBEPOETIN ALFA-POLYSORBATE 300 MCG/0.6ML IJ SOLN: 300.0000 ug | Freq: Once | INTRAMUSCULAR | Status: DC

## 2014-01-30 ENCOUNTER — Ambulatory Visit (HOSPITAL_BASED_OUTPATIENT_CLINIC_OR_DEPARTMENT_OTHER): Payer: Medicare Other | Admitting: Hematology and Oncology

## 2014-01-30 ENCOUNTER — Ambulatory Visit: Payer: Medicare Other

## 2014-01-30 ENCOUNTER — Telehealth: Payer: Self-pay | Admitting: Hematology and Oncology

## 2014-01-30 ENCOUNTER — Encounter: Payer: Self-pay | Admitting: Hematology and Oncology

## 2014-01-30 ENCOUNTER — Other Ambulatory Visit (HOSPITAL_BASED_OUTPATIENT_CLINIC_OR_DEPARTMENT_OTHER): Payer: Medicare Other

## 2014-01-30 VITALS — BP 125/60 | HR 55 | Temp 97.2°F | Resp 18 | Ht 61.0 in | Wt 122.4 lb

## 2014-01-30 DIAGNOSIS — D649 Anemia, unspecified: Secondary | ICD-10-CM

## 2014-01-30 DIAGNOSIS — R51 Headache: Secondary | ICD-10-CM

## 2014-01-30 DIAGNOSIS — R5383 Other fatigue: Secondary | ICD-10-CM

## 2014-01-30 DIAGNOSIS — J449 Chronic obstructive pulmonary disease, unspecified: Secondary | ICD-10-CM

## 2014-01-30 DIAGNOSIS — Z853 Personal history of malignant neoplasm of breast: Secondary | ICD-10-CM

## 2014-01-30 DIAGNOSIS — F17200 Nicotine dependence, unspecified, uncomplicated: Secondary | ICD-10-CM

## 2014-01-30 DIAGNOSIS — D46C Myelodysplastic syndrome with isolated del(5q) chromosomal abnormality: Secondary | ICD-10-CM

## 2014-01-30 DIAGNOSIS — R5381 Other malaise: Secondary | ICD-10-CM

## 2014-01-30 DIAGNOSIS — Z720 Tobacco use: Secondary | ICD-10-CM

## 2014-01-30 LAB — CBC & DIFF AND RETIC
BASO%: 0.4 % (ref 0.0–2.0)
Basophils Absolute: 0 10*3/uL (ref 0.0–0.1)
EOS ABS: 0.5 10*3/uL (ref 0.0–0.5)
EOS%: 6.3 % (ref 0.0–7.0)
HCT: 31.8 % — ABNORMAL LOW (ref 34.8–46.6)
HGB: 10.3 g/dL — ABNORMAL LOW (ref 11.6–15.9)
Immature Retic Fract: 2.9 % (ref 1.60–10.00)
LYMPH%: 34.3 % (ref 14.0–49.7)
MCH: 31.5 pg (ref 25.1–34.0)
MCHC: 32.4 g/dL (ref 31.5–36.0)
MCV: 97.2 fL (ref 79.5–101.0)
MONO#: 1.2 10*3/uL — ABNORMAL HIGH (ref 0.1–0.9)
MONO%: 15.5 % — AB (ref 0.0–14.0)
NEUT#: 3.4 10*3/uL (ref 1.5–6.5)
NEUT%: 43.5 % (ref 38.4–76.8)
Platelets: 315 10*3/uL (ref 145–400)
RBC: 3.27 10*6/uL — AB (ref 3.70–5.45)
RDW: 17.2 % — AB (ref 11.2–14.5)
RETIC %: 0.7 % (ref 0.70–2.10)
RETIC CT ABS: 22.89 10*3/uL — AB (ref 33.70–90.70)
WBC: 7.8 10*3/uL (ref 3.9–10.3)
lymph#: 2.7 10*3/uL (ref 0.9–3.3)

## 2014-01-30 LAB — HOLD TUBE, BLOOD BANK

## 2014-01-30 MED ORDER — DARBEPOETIN ALFA-POLYSORBATE 300 MCG/0.6ML IJ SOLN: 300.0000 ug | Freq: Once | INTRAMUSCULAR | Status: DC

## 2014-01-30 NOTE — Assessment & Plan Note (Signed)
The patient responded well to recent erythropoietin stimulating agents. With results indicative of deletion 5Q, she is a candidate to try Revlimid. However, I am concerned about risk of thrombosis given heavy tobacco abuse. Currently, I felt it is best to stay on course with erythropoietin stimulating agents to keep her hemoglobin greater than 10 g. Once the patient quit smoking, I will proceed to start her on Revlimid.

## 2014-01-30 NOTE — Progress Notes (Signed)
[place] OFFICE PROGRESS NOTE  Patient Care Team: Carlos Levering, PA-C as PCP - General (Family Medicine) Lennon Alstrom, MD (Neurology) Carlos Levering, PA-C as Referring Physician (Family Medicine)  SUMMARY OF ONCOLOGIC HISTORY: She was found to have abnormal CBC from routine blood work. Starting around 2012, she has progressive anemia with hemoglobin and the lowest at 9.1.  She denies recent chest pain on exertion but complains of significant shortness of breath on minimal exertion. She has had pre-syncopal episodes and palpitations. She has profound fatigue and takes frequent naps. Recently, she had a fall.  Of note, the patient has 60 pack plus year history and recently was placed on oxygen therapy.  She never has EGD or screening colonoscopy.  She has been taking vitamin B12 supplements for a long time.  She has remote history of left breast cancer detected from mammogram. According to the patient, she had lumpectomy followed by radiation followed by tamoxifen. She does not know the stage of disease but she does not think the lymph nodes were involved.  The patient was prescribed oral iron supplements and she takes them regularly.  On 12/27/13: Bone marrow biopsy did not show evidence of leukemia or metastatic cancer. Cytogenetics and FISH analysis confirm myelodysplastic syndrome with deletion 5Q. The patient was started on erythropoietin stimulating agent to keep hemoglobin greater than 10 g. INTERVAL HISTORY: Please see below for problem oriented charting. She complained of headaches and fatigue.  REVIEW OF SYSTEMS:   Constitutional: Denies fevers, chills or abnormal weight loss Eyes: Denies blurriness of vision Ears, nose, mouth, throat, and face: Denies mucositis or sore throat Gastrointestinal:  Denies nausea, heartburn or change in bowel habits Skin: Denies abnormal skin rashes Lymphatics: Denies new lymphadenopathy or easy bruising Neurological:Denies  numbness, tingling or new weaknesses Behavioral/Psych: Mood is stable, no new changes  All other systems were reviewed with the patient and are negative.  I have reviewed the past medical history, past surgical history, social history and family history with the patient and they are unchanged from previous note.  ALLERGIES:  is allergic to donepezil; penicillins; amoxapine and related; doxycycline; keflex; ketek; levaquin; sulfa antibiotics; and zyrtec.  MEDICATIONS:  Current Outpatient Prescriptions  Medication Sig Dispense Refill  . acetaminophen (TYLENOL) 325 MG tablet Take 650 mg by mouth every 6 (six) hours as needed (pain).       Marland Kitchen albuterol (PROVENTIL HFA;VENTOLIN HFA) 108 (90 BASE) MCG/ACT inhaler Inhale 1-2 puffs into the lungs every 6 (six) hours as needed for wheezing or shortness of breath (wheezing).      Marland Kitchen albuterol (PROVENTIL) (2.5 MG/3ML) 0.083% nebulizer solution Take 2.5 mg by nebulization every 6 (six) hours.       Marland Kitchen FLUoxetine (PROZAC) 10 MG capsule Take 30 mg by mouth every morning.       . fluticasone (FLONASE) 50 MCG/ACT nasal spray Place 2 sprays into the nose daily as needed (allergies).       Marland Kitchen ibuprofen (ADVIL,MOTRIN) 200 MG tablet Take 400 mg by mouth every 6 (six) hours as needed for moderate pain.      Marland Kitchen lisinopril (PRINIVIL,ZESTRIL) 10 MG tablet Take 20 mg by mouth every morning.       Marland Kitchen LORazepam (ATIVAN) 0.5 MG tablet Take 0.5 mg by mouth every 8 (eight) hours as needed (anxiety).       . metoprolol succinate (TOPROL-XL) 50 MG 24 hr tablet Take 50 mg by mouth every evening. Take with or immediately following a meal.      .  promethazine (PHENERGAN) 25 MG tablet Take 25 mg by mouth every 6 (six) hours as needed for nausea.       No current facility-administered medications for this visit.    PHYSICAL EXAMINATION: ECOG PERFORMANCE STATUS: 2 - Symptomatic, <50% confined to bed  Filed Vitals:   01/30/14 1103  BP: 125/60  Pulse: 55  Temp: 97.2 F (36.2 C)   Resp: 18   Filed Weights   01/30/14 1103  Weight: 122 lb 6.4 oz (55.52 kg)    GENERAL:alert, no distress and comfortable. She looks elderly and pale SKIN: skin color is pale, texture, turgor are normal, no rashes or significant lesions EYES: normal, Conjunctiva are pale and non-injected, sclera clear OROPHARYNX:no exudate, no erythema and lips, buccal mucosa, and tongue normal  Musculoskeletal:no cyanosis of digits and no clubbing  NEURO: alert & oriented x 3 with fluent speech, no focal motor/sensory deficits  LABORATORY DATA:  I have reviewed the data as listed    Component Value Date/Time   NA 137 12/24/2013 2013   NA 141 12/19/2013 0938   K 4.4 12/24/2013 2013   K 4.7 12/19/2013 0938   CL 101 12/24/2013 2013   CO2 27 12/19/2013 0938   CO2 27 10/13/2012 2045   GLUCOSE 93 12/24/2013 2013   GLUCOSE 96 12/19/2013 0938   BUN 14 12/24/2013 2013   BUN 12.0 12/19/2013 0938   CREATININE 0.80 12/24/2013 2013   CREATININE 0.8 12/19/2013 0938   CALCIUM 9.1 12/19/2013 0938   CALCIUM 8.7 10/13/2012 2045   PROT 6.3* 12/19/2013 0938   PROT 6.1 01/02/2012 0414   ALBUMIN 3.5 12/19/2013 0938   ALBUMIN 3.3* 01/02/2012 0414   AST 12 12/19/2013 0938   AST 23 01/02/2012 0414   ALT <6 12/19/2013 0938   ALT 10 01/02/2012 0414   ALKPHOS 67 12/19/2013 0938   ALKPHOS 61 01/02/2012 0414   BILITOT 0.64 12/19/2013 0938   BILITOT 1.1 01/02/2012 0414   GFRNONAA 79* 10/13/2012 2045   GFRAA >90 10/13/2012 2045    No results found for this basename: SPEP, UPEP,  kappa and lambda light chains    Lab Results  Component Value Date   WBC 7.8 01/30/2014   NEUTROABS 3.4 01/30/2014   HGB 10.3* 01/30/2014   HCT 31.8* 01/30/2014   MCV 97.2 01/30/2014   PLT 315 01/30/2014      Chemistry      Component Value Date/Time   NA 137 12/24/2013 2013   NA 141 12/19/2013 0938   K 4.4 12/24/2013 2013   K 4.7 12/19/2013 0938   CL 101 12/24/2013 2013   CO2 27 12/19/2013 0938   CO2 27 10/13/2012 2045   BUN 14 12/24/2013 2013   BUN 12.0 12/19/2013 0938    CREATININE 0.80 12/24/2013 2013   CREATININE 0.8 12/19/2013 0938      Component Value Date/Time   CALCIUM 9.1 12/19/2013 0938   CALCIUM 8.7 10/13/2012 2045   ALKPHOS 67 12/19/2013 0938   ALKPHOS 61 01/02/2012 0414   AST 12 12/19/2013 0938   AST 23 01/02/2012 0414   ALT <6 12/19/2013 0938   ALT 10 01/02/2012 0414   BILITOT 0.64 12/19/2013 0938   BILITOT 1.1 01/02/2012 0414      ASSESSMENT & PLAN:  MDS (myelodysplastic syndrome) with 5q deletion The patient responded well to recent erythropoietin stimulating agents. With results indicative of deletion 5Q, she is a candidate to try Revlimid. However, I am concerned about risk of thrombosis given heavy tobacco abuse. Currently,  I felt it is best to stay on course with erythropoietin stimulating agents to keep her hemoglobin greater than 10 g. Once the patient quit smoking, I will proceed to start her on Revlimid.  COPD (chronic obstructive pulmonary disease) This is due to smoking. She has poor functional status and is not willing to quit smoking.  Tobacco abuse I spent some time counseling the patient the importance of tobacco cessation. She is not interested to quit.  All questions were answered. The patient knows to call the clinic with any problems, questions or concerns. No barriers to learning was detected. I spent 25 minutes counseling the patient face to face. The total time spent in the appointment was 30 minutes and more than 50% was on counseling and review of test results     [facility], [place], MD 01/30/2014 7:39 PM

## 2014-01-30 NOTE — Assessment & Plan Note (Signed)
This is due to smoking. She has poor functional status and is not willing to quit smoking.

## 2014-01-30 NOTE — Telephone Encounter (Signed)
gv and printed appt sched and avs for pt for July thru Sept

## 2014-01-30 NOTE — Patient Instructions (Signed)
Lenalidomide Oral Capsules What is this medicine? LENALIDOMIDE (len a LID oh mide) is used to treat certain types of cancer, including multiple myeloma and mantle cell lymphoma. It is also used to treat some myelodysplastic syndromes that cause severe anemia requiring blood transfusions. This medicine may be used for other purposes; ask your health care provider or pharmacist if you have questions. COMMON BRAND NAME(S): Revlimid What should I tell my health care provider before I take this medicine? They need to know if you have any of these conditions: -blood clots in the legs or the lungs -infection -irregular monthly periods or menstrual cycles -kidney disease -liver disease -an unusual or allergic reaction to lenalidomide, other medicines, foods, dyes, or preservatives -pregnant or trying to get pregnant -breast-feeding How should I use this medicine? Take this medicine by mouth with a glass of water. Follow the directions on the prescription label. Do not cut, crush, or chew this medicine. Take your medicine at regular intervals. Do not take it more often than directed. Do not stop taking except on your doctor's advice. A MedGuide will be given with each prescription and refill. Read this guide carefully each time. The MedGuide may change frequently. Talk to your pediatrician regarding the use of this medicine in children. Special care may be needed. Overdosage: If you think you have taken too much of this medicine contact a poison control center or emergency room at once. NOTE: This medicine is only for you. Do not share this medicine with others. What if I miss a dose? If you miss a dose, take it as soon as you can. If your next dose is to be taken in less than 12 hours, then do not take the missed dose. Take the next dose at your regular time. Do not take double or extra doses. What may interact with this medicine? -vaccines This list may not describe all possible interactions. Give  your health care provider a list of all the medicines, herbs, non-prescription drugs, or dietary supplements you use. Also tell them if you smoke, drink alcohol, or use illegal drugs. Some items may interact with your medicine. What should I watch for while using this medicine? Visit your doctor for regular check ups. Tell your doctor or healthcare professional if your symptoms do not start to get better or if they get worse. You will need to have important blood work done while you are taking this medicine. This medicine is available only through a special program. Doctors, pharmacies, and patients must meet all of the conditions of the program. Your health care provider will help you get signed up with the program if you need this medicine. Through the program you will only receive up to a 28 day supply of the medicine at one time. You will need a new prescription for each refill. This medicine can cause birth defects. Do not get pregnant while taking this drug. Females with child-bearing potential will need to have 2 negative pregnancy tests before starting this medicine. Pregnancy testing must be done every 2 to 4 weeks as directed while taking this medicine. Use 2 reliable forms of birth control together while you are taking this medicine and for 1 month after you stop taking this medicine. If you think that you might be pregnant talk to your doctor right away. Men must use a latex condom during sexual contact with a woman while taking this medicine and for 28 days after you stop taking this medicine. A latex condom is needed even  if you have had a vasectomy. Contact your doctor right away if your partner becomes pregnant. Do not donate sperm while taking this medicine and for 28 days after you stop taking this medicine. Do not give blood while taking the medicine and for 1 month after completion of treatment to avoid exposing pregnant women to the medicine through the donated blood. Talk to your doctor  about your risk of cancer. You may be more at risk for certain types of cancers if you take this medicine. You may need blood work done while you are taking this medicine. What side effects may I notice from receiving this medicine? Side effects that you should report to your doctor or health care professional as soon as possible: -allergic reactions like skin rash, itching or hives, swelling of the face, lips, or tongue -breathing problems -chest pain -fever, infection, runny nose, or sore throat -pain in the legs -right upper belly pain -signs and symptoms of bleeding such as bloody or black, tarry stools; red or dark-brown urine; spitting up blood or brown material that looks like coffee grounds; red spots on the skin; unusual bruising or bleeding from the eye, gums, or nose -swelling or your hands, ankles, or leg -tiredness -yellowing of the eyes or skin Side effects that usually do not require medical attention (report to your doctor or health care professional if they continue or are bothersome): -diarrhea -dizziness -back pain This list may not describe all possible side effects. Call your doctor for medical advice about side effects. You may report side effects to FDA at 1-800-FDA-1088. Where should I keep my medicine? Keep out of the reach of children. Store at room temperature between 15 and 30 degrees C (59 and 86 degrees F). Throw away any unused medicine after the expiration date. NOTE: This sheet is a summary. It may not cover all possible information. If you have questions about this medicine, talk to your doctor, pharmacist, or health care provider.  2015, Elsevier/Gold Standard. (2012-12-18 16:37:01)

## 2014-01-30 NOTE — Assessment & Plan Note (Signed)
I spent some time counseling the patient the importance of tobacco cessation. She is not interested to quit. 

## 2014-02-20 ENCOUNTER — Other Ambulatory Visit: Payer: Self-pay | Admitting: Hematology and Oncology

## 2014-02-20 ENCOUNTER — Ambulatory Visit (HOSPITAL_BASED_OUTPATIENT_CLINIC_OR_DEPARTMENT_OTHER): Payer: Medicare Other

## 2014-02-20 ENCOUNTER — Other Ambulatory Visit (HOSPITAL_BASED_OUTPATIENT_CLINIC_OR_DEPARTMENT_OTHER): Payer: Medicare Other

## 2014-02-20 VITALS — BP 150/57 | HR 60 | Temp 97.8°F

## 2014-02-20 DIAGNOSIS — D649 Anemia, unspecified: Secondary | ICD-10-CM

## 2014-02-20 DIAGNOSIS — D46C Myelodysplastic syndrome with isolated del(5q) chromosomal abnormality: Secondary | ICD-10-CM

## 2014-02-20 LAB — CBC & DIFF AND RETIC
BASO%: 1.6 % (ref 0.0–2.0)
Basophils Absolute: 0.1 10*3/uL (ref 0.0–0.1)
EOS%: 5.5 % (ref 0.0–7.0)
Eosinophils Absolute: 0.4 10*3/uL (ref 0.0–0.5)
HEMATOCRIT: 27.3 % — AB (ref 34.8–46.6)
HGB: 8.9 g/dL — ABNORMAL LOW (ref 11.6–15.9)
Immature Retic Fract: 6.4 % (ref 1.60–10.00)
LYMPH#: 2.6 10*3/uL (ref 0.9–3.3)
LYMPH%: 40.6 % (ref 14.0–49.7)
MCH: 31 pg (ref 25.1–34.0)
MCHC: 32.6 g/dL (ref 31.5–36.0)
MCV: 95.1 fL (ref 79.5–101.0)
MONO#: 0.8 10*3/uL (ref 0.1–0.9)
MONO%: 12.6 % (ref 0.0–14.0)
NEUT#: 2.5 10*3/uL (ref 1.5–6.5)
NEUT%: 39.7 % (ref 38.4–76.8)
NRBC: 0 % (ref 0–0)
Platelets: 313 10*3/uL (ref 145–400)
RBC: 2.87 10*6/uL — ABNORMAL LOW (ref 3.70–5.45)
RDW: 16.8 % — ABNORMAL HIGH (ref 11.2–14.5)
Retic %: 1.37 % (ref 0.70–2.10)
Retic Ct Abs: 39.32 10*3/uL (ref 33.70–90.70)
WBC: 6.4 10*3/uL (ref 3.9–10.3)

## 2014-02-20 LAB — HOLD TUBE, BLOOD BANK

## 2014-02-20 MED ORDER — DARBEPOETIN ALFA-POLYSORBATE 300 MCG/0.6ML IJ SOLN
300.0000 ug | Freq: Once | INTRAMUSCULAR | Status: AC
  Administered 2014-02-20: 300 ug via SUBCUTANEOUS
  Filled 2014-02-20: qty 0.6

## 2014-02-20 NOTE — Patient Instructions (Signed)
Darbepoetin Alfa injection What is this medicine? DARBEPOETIN ALFA (dar be POE e tin AL fa) helps your body make more red blood cells. It is used to treat anemia caused by chronic kidney failure and chemotherapy. This medicine may be used for other purposes; ask your health care provider or pharmacist if you have questions. COMMON BRAND NAME(S): Aranesp What should I tell my health care provider before I take this medicine? They need to know if you have any of these conditions: -blood clotting disorders or history of blood clots -cancer patient not on chemotherapy -cystic fibrosis -heart disease, such as angina, heart failure, or a history of a heart attack -hemoglobin level of 12 g/dL or greater -high blood pressure -low levels of folate, iron, or vitamin B12 -seizures -an unusual or allergic reaction to darbepoetin, erythropoietin, albumin, hamster proteins, latex, other medicines, foods, dyes, or preservatives -pregnant or trying to get pregnant -breast-feeding How should I use this medicine? This medicine is for injection into a vein or under the skin. It is usually given by a health care professional in a hospital or clinic setting. If you get this medicine at home, you will be taught how to prepare and give this medicine. Do not shake the solution before you withdraw a dose. Use exactly as directed. Take your medicine at regular intervals. Do not take your medicine more often than directed. It is important that you put your used needles and syringes in a special sharps container. Do not put them in a trash can. If you do not have a sharps container, call your pharmacist or healthcare provider to get one. Talk to your pediatrician regarding the use of this medicine in children. While this medicine may be used in children as young as 1 year for selected conditions, precautions do apply. Overdosage: If you think you have taken too much of this medicine contact a poison control center or  emergency room at once. NOTE: This medicine is only for you. Do not share this medicine with others. What if I miss a dose? If you miss a dose, take it as soon as you can. If it is almost time for your next dose, take only that dose. Do not take double or extra doses. What may interact with this medicine? Do not take this medicine with any of the following medications: -epoetin alfa This list may not describe all possible interactions. Give your health care provider a list of all the medicines, herbs, non-prescription drugs, or dietary supplements you use. Also tell them if you smoke, drink alcohol, or use illegal drugs. Some items may interact with your medicine. What should I watch for while using this medicine? Visit your prescriber or health care professional for regular checks on your progress and for the needed blood tests and blood pressure measurements. It is especially important for the doctor to make sure your hemoglobin level is in the desired range, to limit the risk of potential side effects and to give you the best benefit. Keep all appointments for any recommended tests. Check your blood pressure as directed. Ask your doctor what your blood pressure should be and when you should contact him or her. As your body makes more red blood cells, you may need to take iron, folic acid, or vitamin B supplements. Ask your doctor or health care provider which products are right for you. If you have kidney disease continue dietary restrictions, even though this medication can make you feel better. Talk with your doctor or health   care professional about the foods you eat and the vitamins that you take. What side effects may I notice from receiving this medicine? Side effects that you should report to your doctor or health care professional as soon as possible: -allergic reactions like skin rash, itching or hives, swelling of the face, lips, or tongue -breathing problems -changes in vision -chest  pain -confusion, trouble speaking or understanding -feeling faint or lightheaded, falls -high blood pressure -muscle aches or pains -pain, swelling, warmth in the leg -rapid weight gain -severe headaches -sudden numbness or weakness of the face, arm or leg -trouble walking, dizziness, loss of balance or coordination -seizures (convulsions) -swelling of the ankles, feet, hands -unusually weak or tired Side effects that usually do not require medical attention (report to your doctor or health care professional if they continue or are bothersome): -diarrhea -fever, chills (flu-like symptoms) -headaches -nausea, vomiting -redness, stinging, or swelling at site where injected This list may not describe all possible side effects. Call your doctor for medical advice about side effects. You may report side effects to FDA at 1-800-FDA-1088. Where should I keep my medicine? Keep out of the reach of children. Store in a refrigerator between 2 and 8 degrees C (36 and 46 degrees F). Do not freeze. Do not shake. Throw away any unused portion if using a single-dose vial. Throw away any unused medicine after the expiration date. NOTE: This sheet is a summary. It may not cover all possible information. If you have questions about this medicine, talk to your doctor, pharmacist, or health care provider.  2015, [employer] Standard. (2008-07-15 10:23:57)  

## 2014-02-24 ENCOUNTER — Telehealth: Payer: Self-pay | Admitting: *Deleted

## 2014-02-24 ENCOUNTER — Telehealth: Payer: Self-pay | Admitting: Hematology and Oncology

## 2014-02-24 NOTE — Telephone Encounter (Signed)
Can you see if she wants to come in today at 315 pm? Otherwise, Wednesday afternoon at 245 pm? Need labs and rtn visit

## 2014-02-24 NOTE — Telephone Encounter (Signed)
Informed daughter of appt options for today or Wednesday.. She cannot bring pt today but will bring her on Wed at 2:15 pm for lab/ Dr. Alvy Bimler at 2:45 pm.

## 2014-02-24 NOTE — Telephone Encounter (Signed)
Pt left VM states she got injection this past Thursday and has been "going downhill every day since then."  C/o increased fatigue and headaches.

## 2014-02-24 NOTE — Telephone Encounter (Signed)
cld and left message for daughter that appt time is 2:00 for lab 2:30 for appt-per of to sch appt for 2:45/sch for 2:30 Dr Alvy Bimler has 2 pts @ 2:45-Melissa did override

## 2014-02-26 ENCOUNTER — Encounter: Payer: Self-pay | Admitting: Hematology and Oncology

## 2014-02-26 ENCOUNTER — Ambulatory Visit (HOSPITAL_BASED_OUTPATIENT_CLINIC_OR_DEPARTMENT_OTHER): Payer: Medicare Other | Admitting: Hematology and Oncology

## 2014-02-26 ENCOUNTER — Other Ambulatory Visit (HOSPITAL_BASED_OUTPATIENT_CLINIC_OR_DEPARTMENT_OTHER): Payer: Medicare Other

## 2014-02-26 VITALS — BP 137/52 | HR 63 | Temp 98.3°F | Resp 18 | Ht 61.0 in | Wt 122.0 lb

## 2014-02-26 DIAGNOSIS — J438 Other emphysema: Secondary | ICD-10-CM

## 2014-02-26 DIAGNOSIS — J449 Chronic obstructive pulmonary disease, unspecified: Secondary | ICD-10-CM

## 2014-02-26 DIAGNOSIS — R5383 Other fatigue: Secondary | ICD-10-CM

## 2014-02-26 DIAGNOSIS — R5381 Other malaise: Secondary | ICD-10-CM

## 2014-02-26 DIAGNOSIS — D649 Anemia, unspecified: Secondary | ICD-10-CM

## 2014-02-26 DIAGNOSIS — R0602 Shortness of breath: Secondary | ICD-10-CM

## 2014-02-26 DIAGNOSIS — J4489 Other specified chronic obstructive pulmonary disease: Secondary | ICD-10-CM

## 2014-02-26 DIAGNOSIS — R51 Headache: Secondary | ICD-10-CM

## 2014-02-26 DIAGNOSIS — F17200 Nicotine dependence, unspecified, uncomplicated: Secondary | ICD-10-CM

## 2014-02-26 DIAGNOSIS — D46C Myelodysplastic syndrome with isolated del(5q) chromosomal abnormality: Secondary | ICD-10-CM

## 2014-02-26 DIAGNOSIS — R519 Headache, unspecified: Secondary | ICD-10-CM | POA: Insufficient documentation

## 2014-02-26 DIAGNOSIS — Z720 Tobacco use: Secondary | ICD-10-CM

## 2014-02-26 HISTORY — DX: Headache, unspecified: R51.9

## 2014-02-26 LAB — CBC & DIFF AND RETIC
BASO%: 1 % (ref 0.0–2.0)
Basophils Absolute: 0.1 10*3/uL (ref 0.0–0.1)
EOS%: 3.1 % (ref 0.0–7.0)
Eosinophils Absolute: 0.2 10*3/uL (ref 0.0–0.5)
HCT: 28.1 % — ABNORMAL LOW (ref 34.8–46.6)
HGB: 9.3 g/dL — ABNORMAL LOW (ref 11.6–15.9)
Immature Retic Fract: 16.2 % — ABNORMAL HIGH (ref 1.60–10.00)
LYMPH%: 44.4 % (ref 14.0–49.7)
MCH: 31.5 pg (ref 25.1–34.0)
MCHC: 33.1 g/dL (ref 31.5–36.0)
MCV: 95.3 fL (ref 79.5–101.0)
MONO#: 1.1 10*3/uL — ABNORMAL HIGH (ref 0.1–0.9)
MONO%: 16.5 % — ABNORMAL HIGH (ref 0.0–14.0)
NEUT#: 2.4 10*3/uL (ref 1.5–6.5)
NEUT%: 35 % — ABNORMAL LOW (ref 38.4–76.8)
Platelets: 352 10*3/uL (ref 145–400)
RBC: 2.95 10*6/uL — ABNORMAL LOW (ref 3.70–5.45)
RDW: 17.4 % — AB (ref 11.2–14.5)
RETIC %: 2.42 % — AB (ref 0.70–2.10)
Retic Ct Abs: 71.39 10*3/uL (ref 33.70–90.70)
WBC: 6.8 10*3/uL (ref 3.9–10.3)
lymph#: 3 10*3/uL (ref 0.9–3.3)
nRBC: 0 % (ref 0–0)

## 2014-02-26 LAB — TECHNOLOGIST REVIEW

## 2014-02-26 LAB — HOLD TUBE, BLOOD BANK

## 2014-02-26 NOTE — Progress Notes (Signed)
[facility] OFFICE PROGRESS NOTE  Patient Care Team: Carlos Levering, PA-C as PCP - General (Family Medicine) Lennon Alstrom, MD (Neurology) Carlos Levering, PA-C as Referring Physician (Family Medicine)  SUMMARY OF ONCOLOGIC HISTORY: She was found to have abnormal CBC from routine blood work. Starting around 2012, she has progressive anemia with hemoglobin and the lowest at 9.1.  She denies recent chest pain on exertion but complains of significant shortness of breath on minimal exertion. She has had pre-syncopal episodes and palpitations. She has profound fatigue and takes frequent naps. Recently, she had a fall.  Of note, the patient has 60 pack plus year history and recently was placed on oxygen therapy.  She never has EGD or screening colonoscopy.  She has been taking vitamin B12 supplements for a long time.  She has remote history of left breast cancer detected from mammogram. According to the patient, she had lumpectomy followed by radiation followed by tamoxifen. She does not know the stage of disease but she does not think the lymph nodes were involved.  The patient was prescribed oral iron supplements and she takes them regularly.  On 12/27/13: Bone marrow biopsy did not show evidence of leukemia or metastatic cancer. Cytogenetics and FISH analysis confirm myelodysplastic syndrome with deletion 5Q. The patient was started on erythropoietin stimulating agent to keep hemoglobin greater than 10 g.  INTERVAL HISTORY: Please see below for problem oriented charting. She continues to complain of headaches, shortness of breath and weakness. She continued to smoke.  REVIEW OF SYSTEMS:   Constitutional: Denies fevers, chills or abnormal weight loss Eyes: Denies blurriness of vision Ears, nose, mouth, throat, and face: Denies mucositis or sore throat Respiratory: Denies cough, dyspnea or wheezes Cardiovascular: Denies palpitation, chest discomfort or lower extremity  swelling Gastrointestinal:  Denies nausea, heartburn or change in bowel habits Skin: Denies abnormal skin rashes Lymphatics: Denies new lymphadenopathy or easy bruising Neurological:Denies numbness, tingling or new weaknesses Behavioral/Psych: Mood is stable, no new changes  All other systems were reviewed with the patient and are negative.  I have reviewed the past medical history, past surgical history, social history and family history with the patient and they are unchanged from previous note.  ALLERGIES:  is allergic to donepezil; penicillins; amoxapine and related; doxycycline; keflex; ketek; levaquin; sulfa antibiotics; and zyrtec.  MEDICATIONS:  Current Outpatient Prescriptions  Medication Sig Dispense Refill  . acetaminophen (TYLENOL) 325 MG tablet Take 650 mg by mouth every 6 (six) hours as needed (pain).       Marland Kitchen albuterol (PROVENTIL HFA;VENTOLIN HFA) 108 (90 BASE) MCG/ACT inhaler Inhale 1-2 puffs into the lungs every 6 (six) hours as needed for wheezing or shortness of breath (wheezing).      Marland Kitchen albuterol (PROVENTIL) (2.5 MG/3ML) 0.083% nebulizer solution Take 2.5 mg by nebulization every 6 (six) hours.       Marland Kitchen FLUoxetine (PROZAC) 10 MG capsule Take 30 mg by mouth every morning.       . fluticasone (FLONASE) 50 MCG/ACT nasal spray Place 2 sprays into the nose daily as needed (allergies).       Marland Kitchen ibuprofen (ADVIL,MOTRIN) 200 MG tablet Take 400 mg by mouth every 6 (six) hours as needed for moderate pain.      Marland Kitchen LORazepam (ATIVAN) 0.5 MG tablet Take 0.5 mg by mouth every 8 (eight) hours as needed (anxiety).       . metoprolol succinate (TOPROL-XL) 50 MG 24 hr tablet Take 50 mg by mouth every evening. Take with  or immediately following a meal.      . promethazine (PHENERGAN) 25 MG tablet Take 25 mg by mouth every 6 (six) hours as needed for nausea.      Marland Kitchen lisinopril (PRINIVIL,ZESTRIL) 10 MG tablet Take 20 mg by mouth every morning.        No current facility-administered medications  for this visit.    PHYSICAL EXAMINATION: ECOG PERFORMANCE STATUS: 2 - Symptomatic, <50% confined to bed  Filed Vitals:   02/26/14 1421  BP: 137/52  Pulse: 63  Temp: 98.3 F (36.8 C)  Resp: 18   Filed Weights   02/26/14 1421  Weight: 122 lb (55.339 kg)    GENERAL:alert, no distress and comfortable SKIN: skin color is pale, texture, turgor are normal, no rashes or significant lesions EYES: normal, Conjunctiva are pale and non-injected, sclera clear Musculoskeletal:no cyanosis of digits and no clubbing  NEURO: alert & oriented x 3 with fluent speech, no focal motor/sensory deficits  LABORATORY DATA:  I have reviewed the data as listed    Component Value Date/Time   NA 137 12/24/2013 2013   NA 141 12/19/2013 0938   K 4.4 12/24/2013 2013   K 4.7 12/19/2013 0938   CL 101 12/24/2013 2013   CO2 27 12/19/2013 0938   CO2 27 10/13/2012 2045   GLUCOSE 93 12/24/2013 2013   GLUCOSE 96 12/19/2013 0938   BUN 14 12/24/2013 2013   BUN 12.0 12/19/2013 0938   CREATININE 0.80 12/24/2013 2013   CREATININE 0.8 12/19/2013 0938   CALCIUM 9.1 12/19/2013 0938   CALCIUM 8.7 10/13/2012 2045   PROT 6.3* 12/19/2013 0938   PROT 6.1 01/02/2012 0414   ALBUMIN 3.5 12/19/2013 0938   ALBUMIN 3.3* 01/02/2012 0414   AST 12 12/19/2013 0938   AST 23 01/02/2012 0414   ALT <6 12/19/2013 0938   ALT 10 01/02/2012 0414   ALKPHOS 67 12/19/2013 0938   ALKPHOS 61 01/02/2012 0414   BILITOT 0.64 12/19/2013 0938   BILITOT 1.1 01/02/2012 0414   GFRNONAA 79* 10/13/2012 2045   GFRAA >90 10/13/2012 2045    No results found for this basename: SPEP,  UPEP,   kappa and lambda light chains    Lab Results  Component Value Date   WBC 6.8 02/26/2014   NEUTROABS 2.4 02/26/2014   HGB 9.3* 02/26/2014   HCT 28.1* 02/26/2014   MCV 95.3 02/26/2014   PLT 352 02/26/2014      Chemistry      Component Value Date/Time   NA 137 12/24/2013 2013   NA 141 12/19/2013 0938   K 4.4 12/24/2013 2013   K 4.7 12/19/2013 0938   CL 101 12/24/2013 2013   CO2 27 12/19/2013 0938    CO2 27 10/13/2012 2045   BUN 14 12/24/2013 2013   BUN 12.0 12/19/2013 0938   CREATININE 0.80 12/24/2013 2013   CREATININE 0.8 12/19/2013 0938      Component Value Date/Time   CALCIUM 9.1 12/19/2013 0938   CALCIUM 8.7 10/13/2012 2045   ALKPHOS 67 12/19/2013 0938   ALKPHOS 61 01/02/2012 0414   AST 12 12/19/2013 0938   AST 23 01/02/2012 0414   ALT <6 12/19/2013 0938   ALT 10 01/02/2012 0414   BILITOT 0.64 12/19/2013 0938   BILITOT 1.1 01/02/2012 0414      ASSESSMENT & PLAN:  MDS (myelodysplastic syndrome) with 5q deletion The patient responded well to recent erythropoietin stimulating agents. She does not require transfusion.  Tobacco abuse I spent some time counseling  the patient the importance of tobacco cessation. she is currently attempting to quit on her own  Persistent headaches This is likely related to hypoxemia from smoking and anemia. I recommend she increase frequency of use of oxygen.  COPD (chronic obstructive pulmonary disease) She has severe COPD and emphysema and on chronic oxygen therapy. I recommend she quit smoking and she will try.    No orders of the defined types were placed in this encounter.   All questions were answered. The patient knows to call the clinic with any problems, questions or concerns. No barriers to learning was detected. I spent 15 minutes counseling the patient face to face. The total time spent in the appointment was 20 minutes and more than 50% was on counseling and review of test results     [facility], [place], MD 02/26/2014 4:32 PM

## 2014-02-26 NOTE — Assessment & Plan Note (Signed)
The patient responded well to recent erythropoietin stimulating agents. She does not require transfusion.

## 2014-02-26 NOTE — Assessment & Plan Note (Signed)
I spent some time counseling the patient the importance of tobacco cessation. she is currently attempting to quit on her own 

## 2014-02-26 NOTE — Assessment & Plan Note (Signed)
This is likely related to hypoxemia from smoking and anemia. I recommend she increase frequency of use of oxygen.

## 2014-02-26 NOTE — Assessment & Plan Note (Signed)
She has severe COPD and emphysema and on chronic oxygen therapy. I recommend she quit smoking and she will try.

## 2014-02-27 ENCOUNTER — Ambulatory Visit: Payer: Medicare Other | Admitting: Hematology and Oncology

## 2014-03-13 ENCOUNTER — Ambulatory Visit (HOSPITAL_BASED_OUTPATIENT_CLINIC_OR_DEPARTMENT_OTHER): Payer: Medicare Other

## 2014-03-13 ENCOUNTER — Other Ambulatory Visit (HOSPITAL_BASED_OUTPATIENT_CLINIC_OR_DEPARTMENT_OTHER): Payer: Medicare Other

## 2014-03-13 VITALS — BP 129/60 | HR 60 | Temp 97.7°F

## 2014-03-13 DIAGNOSIS — D46C Myelodysplastic syndrome with isolated del(5q) chromosomal abnormality: Secondary | ICD-10-CM

## 2014-03-13 DIAGNOSIS — D649 Anemia, unspecified: Secondary | ICD-10-CM

## 2014-03-13 LAB — CBC & DIFF AND RETIC
BASO%: 3.1 % — ABNORMAL HIGH (ref 0.0–2.0)
Basophils Absolute: 0.1 10*3/uL (ref 0.0–0.1)
EOS ABS: 0.3 10*3/uL (ref 0.0–0.5)
EOS%: 7 % (ref 0.0–7.0)
HEMATOCRIT: 26.1 % — AB (ref 34.8–46.6)
HEMOGLOBIN: 8.5 g/dL — AB (ref 11.6–15.9)
Immature Retic Fract: 4.3 % (ref 1.60–10.00)
LYMPH%: 41.8 % (ref 14.0–49.7)
MCH: 32.6 pg (ref 25.1–34.0)
MCHC: 32.6 g/dL (ref 31.5–36.0)
MCV: 100 fL (ref 79.5–101.0)
MONO#: 0.5 10*3/uL (ref 0.1–0.9)
MONO%: 11.8 % (ref 0.0–14.0)
NEUT#: 1.7 10*3/uL (ref 1.5–6.5)
NEUT%: 36.3 % — ABNORMAL LOW (ref 38.4–76.8)
NRBC: 0 % (ref 0–0)
Platelets: 280 10*3/uL (ref 145–400)
RBC: 2.61 10*6/uL — AB (ref 3.70–5.45)
RDW: 18.9 % — ABNORMAL HIGH (ref 11.2–14.5)
Retic %: 1.48 % (ref 0.70–2.10)
Retic Ct Abs: 38.63 10*3/uL (ref 33.70–90.70)
WBC: 4.6 10*3/uL (ref 3.9–10.3)
lymph#: 1.9 10*3/uL (ref 0.9–3.3)

## 2014-03-13 LAB — HOLD TUBE, BLOOD BANK

## 2014-03-13 MED ORDER — DARBEPOETIN ALFA-POLYSORBATE 300 MCG/0.6ML IJ SOLN
300.0000 ug | Freq: Once | INTRAMUSCULAR | Status: AC
  Administered 2014-03-13: 300 ug via SUBCUTANEOUS
  Filled 2014-03-13: qty 0.6

## 2014-03-20 ENCOUNTER — Telehealth: Payer: Self-pay | Admitting: *Deleted

## 2014-03-20 ENCOUNTER — Other Ambulatory Visit (HOSPITAL_BASED_OUTPATIENT_CLINIC_OR_DEPARTMENT_OTHER): Payer: Medicare Other

## 2014-03-20 DIAGNOSIS — D46C Myelodysplastic syndrome with isolated del(5q) chromosomal abnormality: Secondary | ICD-10-CM

## 2014-03-20 DIAGNOSIS — C50519 Malignant neoplasm of lower-outer quadrant of unspecified female breast: Secondary | ICD-10-CM

## 2014-03-20 LAB — CBC WITH DIFFERENTIAL/PLATELET
BASO%: 3.4 % — AB (ref 0.0–2.0)
BASOS ABS: 0.2 10*3/uL — AB (ref 0.0–0.1)
EOS%: 5.4 % (ref 0.0–7.0)
Eosinophils Absolute: 0.3 10*3/uL (ref 0.0–0.5)
HCT: 26.4 % — ABNORMAL LOW (ref 34.8–46.6)
HEMOGLOBIN: 8.9 g/dL — AB (ref 11.6–15.9)
LYMPH%: 48.5 % (ref 14.0–49.7)
MCH: 34.3 pg — AB (ref 25.1–34.0)
MCHC: 33.7 g/dL (ref 31.5–36.0)
MCV: 101.8 fL — ABNORMAL HIGH (ref 79.5–101.0)
MONO#: 1.1 10*3/uL — ABNORMAL HIGH (ref 0.1–0.9)
MONO%: 19.7 % — ABNORMAL HIGH (ref 0.0–14.0)
NEUT#: 1.3 10*3/uL — ABNORMAL LOW (ref 1.5–6.5)
NEUT%: 23 % — ABNORMAL LOW (ref 38.4–76.8)
Platelets: 380 10*3/uL (ref 145–400)
RBC: 2.59 10*6/uL — ABNORMAL LOW (ref 3.70–5.45)
RDW: 20.6 % — AB (ref 11.2–14.5)
WBC: 5.7 10*3/uL (ref 3.9–10.3)
lymph#: 2.8 10*3/uL (ref 0.9–3.3)

## 2014-03-20 LAB — HOLD TUBE, BLOOD BANK

## 2014-03-20 NOTE — Telephone Encounter (Signed)
Dr. Alvy Bimler reveiwed pt's CBC and instructs no need for transfusion.  Pt to return in 2 weeks as scheduled.  S/w pt and daughter in lobby.  Gave copy of CBC and informed of Hgb stable at 8.9, no need for transfusion.  Pt states she doesn't understand why she is so tired all the time.  Explained chronic anemia can cause fatigue, along with her COPD and smoking.  Urged pt to quit smoking.  Offered to schedule a smoking cessation class for pt and she says she will think about it.  Instructed to contact her PCP if fatigue continues to get worse.  Return to [facility] in 2 weeks for lab and injection.  She verbalized understanding.

## 2014-03-20 NOTE — Telephone Encounter (Signed)
She may need blood tx. Please call her in for lab appt today

## 2014-03-20 NOTE — Telephone Encounter (Signed)
   Provider input needed: Headaches   Reason for call: Headaches & fatigue  Constitutional: positive for fatigue  Neurological: positive for headaches  Since 7/30 Aranesp-persistent headache and "extreme" fatigue starting 03/15/14   ALLERGIES:  is allergic to donepezil; penicillins; amoxapine and related; doxycycline; keflex; ketek; levaquin; sulfa antibiotics; and zyrtec.  Patient last received chemotherapy/ treatment on 03/13/14--3rd dose of Aranesp  Patient was last seen in the office on 02/26/14   Next appt is 04/03/14-injection and 04/24/14-MD visit  Is patient having fevers greater than 100.5?  no   Is patient having uncontrolled pain, or new pain? yes, reports headache both temporal areas that is almost daily lasting several hours and rates 9/10 on scale. Says "it's debilitating" to her. Taking Tylenol 500 mg + Ibuprofen 400 mg three times daily. She denies any photosensitivity, but reports mild nausea and feels her vision is not as clear. No weakness or gait issue or speech or memory difficulty reported.  Adds that she feels as if she is losing feeling in her toes-bilateral. She does have H/O headaches, but thinks this is different.  Is patient having new back pain that changes with position (worsens or eases when laying down?)  no   Is patient able to eat and drink? yes   Is patient able to pass stool without difficulty?   n/a     Is patient having uncontrolled nausea?  no, mild nausea    Summary Based on the above information advised patient that her symptoms will be relayed to MD. Doubtful the Aranesp is still causing these headaches since her dose was a week ago. Inquired if she called her PCP-says he is not in the office today.   Tania Ade  03/20/2014, 10:41 AM   Background Info  JALECIA LEON   DOB: [birth year]   MR#: [id number]   CSN#   [id number] 03/20/2014

## 2014-03-20 NOTE — Telephone Encounter (Signed)
Tonya Mckee called back and agrees to come in at 2 pm today for lab work. She will have her daughter drive her.

## 2014-03-20 NOTE — Telephone Encounter (Signed)
Left VM for patient and daughter to return call to triage to schedule lab today-may need transfusion.

## 2014-04-03 ENCOUNTER — Ambulatory Visit (HOSPITAL_BASED_OUTPATIENT_CLINIC_OR_DEPARTMENT_OTHER): Payer: Medicare Other

## 2014-04-03 ENCOUNTER — Other Ambulatory Visit (HOSPITAL_BASED_OUTPATIENT_CLINIC_OR_DEPARTMENT_OTHER): Payer: Medicare Other

## 2014-04-03 VITALS — BP 145/55 | HR 62 | Temp 97.9°F

## 2014-04-03 DIAGNOSIS — D46C Myelodysplastic syndrome with isolated del(5q) chromosomal abnormality: Secondary | ICD-10-CM

## 2014-04-03 DIAGNOSIS — D638 Anemia in other chronic diseases classified elsewhere: Secondary | ICD-10-CM

## 2014-04-03 DIAGNOSIS — D649 Anemia, unspecified: Secondary | ICD-10-CM

## 2014-04-03 LAB — CBC & DIFF AND RETIC
BASO%: 1.6 % (ref 0.0–2.0)
Basophils Absolute: 0.1 10*3/uL (ref 0.0–0.1)
EOS ABS: 0.2 10*3/uL (ref 0.0–0.5)
EOS%: 3.8 % (ref 0.0–7.0)
HCT: 24.9 % — ABNORMAL LOW (ref 34.8–46.6)
HGB: 8.3 g/dL — ABNORMAL LOW (ref 11.6–15.9)
IMMATURE RETIC FRACT: 5.4 % (ref 1.60–10.00)
LYMPH#: 2.3 10*3/uL (ref 0.9–3.3)
LYMPH%: 46.3 % (ref 14.0–49.7)
MCH: 34.2 pg — ABNORMAL HIGH (ref 25.1–34.0)
MCHC: 33.3 g/dL (ref 31.5–36.0)
MCV: 102.5 fL — ABNORMAL HIGH (ref 79.5–101.0)
MONO#: 0.7 10*3/uL (ref 0.1–0.9)
MONO%: 13.2 % (ref 0.0–14.0)
NEUT%: 35.1 % — ABNORMAL LOW (ref 38.4–76.8)
NEUTROS ABS: 1.8 10*3/uL (ref 1.5–6.5)
NRBC: 0 % (ref 0–0)
Platelets: 257 10*3/uL (ref 145–400)
RBC: 2.43 10*6/uL — AB (ref 3.70–5.45)
RDW: 18.7 % — AB (ref 11.2–14.5)
RETIC %: 1.56 % (ref 0.70–2.10)
Retic Ct Abs: 37.91 10*3/uL (ref 33.70–90.70)
WBC: 5 10*3/uL (ref 3.9–10.3)

## 2014-04-03 LAB — HOLD TUBE, BLOOD BANK

## 2014-04-03 MED ORDER — DARBEPOETIN ALFA-POLYSORBATE 300 MCG/0.6ML IJ SOLN
300.0000 ug | Freq: Once | INTRAMUSCULAR | Status: AC
  Administered 2014-04-03: 300 ug via SUBCUTANEOUS
  Filled 2014-04-03: qty 0.6

## 2014-04-07 ENCOUNTER — Telehealth: Payer: Self-pay | Admitting: Medical Oncology

## 2014-04-07 NOTE — Telephone Encounter (Signed)
Patient called, states she has not been feeling well since her last Aranesp injection on the 08/20. Reporting to be "wiped out from last shot" fatigued and headache since Friday 08/21, states she "does not wish to take the shots anymore" and wants to let Dr. Alvy Bimler know. Reports to fatigued to get out of bed. Informed patient will let Dr. Alvy Bimler aware of patients wishes.  Patient rates headache @ 9 out of 10, reports to have taken tylenol.  F/U 09/10 lab/MD/inj

## 2014-04-07 NOTE — Telephone Encounter (Signed)
OK thanks Take tylenol prn

## 2014-04-09 ENCOUNTER — Telehealth: Payer: Self-pay | Admitting: *Deleted

## 2014-04-09 NOTE — Telephone Encounter (Signed)
She can try ibuprofen. The headache could be due to anemia

## 2014-04-09 NOTE — Telephone Encounter (Signed)
Pt reports still feeling bad after getting injection last week.  She states was feeling good until the shot and now has head pounding with body aches, fatigue and just feeling "really, really rough."  Pt states she is taking tylenol as directed but wants to know if anything else can be done to help?

## 2014-04-10 ENCOUNTER — Telehealth: Payer: Self-pay | Admitting: *Deleted

## 2014-04-10 NOTE — Telephone Encounter (Signed)
Left VM for pt to return nurse's call to see how she is feeling today.

## 2014-04-15 ENCOUNTER — Encounter (HOSPITAL_COMMUNITY): Payer: Self-pay | Admitting: Emergency Medicine

## 2014-04-15 ENCOUNTER — Emergency Department (HOSPITAL_COMMUNITY)
Admission: EM | Admit: 2014-04-15 | Discharge: 2014-04-15 | Disposition: A | Discharge status: Home / Self Care | Payer: Medicare Other | Attending: Emergency Medicine | Admitting: Emergency Medicine

## 2014-04-15 ENCOUNTER — Emergency Department (HOSPITAL_COMMUNITY): Payer: Medicare Other

## 2014-04-15 DIAGNOSIS — Z8719 Personal history of other diseases of the digestive system: Secondary | ICD-10-CM | POA: Diagnosis not present

## 2014-04-15 DIAGNOSIS — M129 Arthropathy, unspecified: Secondary | ICD-10-CM | POA: Insufficient documentation

## 2014-04-15 DIAGNOSIS — R51 Headache: Secondary | ICD-10-CM | POA: Diagnosis not present

## 2014-04-15 DIAGNOSIS — Z8673 Personal history of transient ischemic attack (TIA), and cerebral infarction without residual deficits: Secondary | ICD-10-CM | POA: Diagnosis not present

## 2014-04-15 DIAGNOSIS — R519 Headache, unspecified: Secondary | ICD-10-CM

## 2014-04-15 DIAGNOSIS — IMO0002 Reserved for concepts with insufficient information to code with codable children: Secondary | ICD-10-CM | POA: Insufficient documentation

## 2014-04-15 DIAGNOSIS — I10 Essential (primary) hypertension: Secondary | ICD-10-CM | POA: Insufficient documentation

## 2014-04-15 DIAGNOSIS — R42 Dizziness and giddiness: Secondary | ICD-10-CM | POA: Diagnosis not present

## 2014-04-15 DIAGNOSIS — I499 Cardiac arrhythmia, unspecified: Secondary | ICD-10-CM | POA: Insufficient documentation

## 2014-04-15 DIAGNOSIS — F068 Other specified mental disorders due to known physiological condition: Secondary | ICD-10-CM | POA: Diagnosis not present

## 2014-04-15 DIAGNOSIS — F3289 Other specified depressive episodes: Secondary | ICD-10-CM | POA: Diagnosis not present

## 2014-04-15 DIAGNOSIS — F329 Major depressive disorder, single episode, unspecified: Secondary | ICD-10-CM | POA: Insufficient documentation

## 2014-04-15 DIAGNOSIS — F17200 Nicotine dependence, unspecified, uncomplicated: Secondary | ICD-10-CM | POA: Diagnosis not present

## 2014-04-15 DIAGNOSIS — Z853 Personal history of malignant neoplasm of breast: Secondary | ICD-10-CM | POA: Diagnosis not present

## 2014-04-15 DIAGNOSIS — E538 Deficiency of other specified B group vitamins: Secondary | ICD-10-CM | POA: Insufficient documentation

## 2014-04-15 DIAGNOSIS — Z862 Personal history of diseases of the blood and blood-forming organs and certain disorders involving the immune mechanism: Secondary | ICD-10-CM | POA: Diagnosis not present

## 2014-04-15 DIAGNOSIS — J449 Chronic obstructive pulmonary disease, unspecified: Secondary | ICD-10-CM | POA: Diagnosis not present

## 2014-04-15 DIAGNOSIS — Z8701 Personal history of pneumonia (recurrent): Secondary | ICD-10-CM | POA: Diagnosis not present

## 2014-04-15 DIAGNOSIS — J4489 Other specified chronic obstructive pulmonary disease: Secondary | ICD-10-CM | POA: Insufficient documentation

## 2014-04-15 DIAGNOSIS — Z880 Allergy status to penicillin: Secondary | ICD-10-CM | POA: Insufficient documentation

## 2014-04-15 DIAGNOSIS — Z79899 Other long term (current) drug therapy: Secondary | ICD-10-CM | POA: Insufficient documentation

## 2014-04-15 DIAGNOSIS — Z872 Personal history of diseases of the skin and subcutaneous tissue: Secondary | ICD-10-CM | POA: Diagnosis not present

## 2014-04-15 DIAGNOSIS — D72820 Lymphocytosis (symptomatic): Secondary | ICD-10-CM

## 2014-04-15 DIAGNOSIS — Z85828 Personal history of other malignant neoplasm of skin: Secondary | ICD-10-CM | POA: Diagnosis not present

## 2014-04-15 LAB — COMPREHENSIVE METABOLIC PANEL
ALK PHOS: 63 U/L (ref 39–117)
ALT: 9 U/L (ref 0–35)
AST: 19 U/L (ref 0–37)
Albumin: 3.8 g/dL (ref 3.5–5.2)
Anion gap: 9 (ref 5–15)
BILIRUBIN TOTAL: 0.6 mg/dL (ref 0.3–1.2)
BUN: 16 mg/dL (ref 6–23)
CO2: 28 meq/L (ref 19–32)
CREATININE: 0.65 mg/dL (ref 0.50–1.10)
Calcium: 8.5 mg/dL (ref 8.4–10.5)
Chloride: 102 mEq/L (ref 96–112)
GFR calc Af Amer: >90 mL/min (ref >90)
GFR, EST NON AFRICAN AMERICAN: 80 mL/min — AB (ref >90)
Glucose, Bld: 129 mg/dL — ABNORMAL HIGH (ref 70–99)
POTASSIUM: 4.1 meq/L (ref 3.7–5.3)
Sodium: 139 mEq/L (ref 137–147)
Total Protein: 6.4 g/dL (ref 6.0–8.3)

## 2014-04-15 LAB — CBC WITH DIFFERENTIAL/PLATELET
Basophils Absolute: 0.1 10*3/uL (ref 0.0–0.1)
Basophils Relative: 2 % — ABNORMAL HIGH (ref 0–1)
Eosinophils Absolute: 0.2 10*3/uL (ref 0.0–0.7)
Eosinophils Relative: 4 % (ref 0–5)
HEMATOCRIT: 26.3 % — AB (ref 36.0–46.0)
HEMOGLOBIN: 9 g/dL — AB (ref 12.0–15.0)
LYMPHS PCT: 50 % — AB (ref 12–46)
Lymphs Abs: 2.6 10*3/uL (ref 0.7–4.0)
MCH: 35.3 pg — ABNORMAL HIGH (ref 26.0–34.0)
MCHC: 34.2 g/dL (ref 30.0–36.0)
MCV: 103.1 fL — ABNORMAL HIGH (ref 78.0–100.0)
MONO ABS: 0.5 10*3/uL (ref 0.1–1.0)
MONOS PCT: 10 % (ref 3–12)
NEUTROS PCT: 34 % — AB (ref 43–77)
Neutro Abs: 1.8 10*3/uL (ref 1.7–7.7)
Platelets: 356 10*3/uL (ref 150–400)
RBC: 2.55 MIL/uL — ABNORMAL LOW (ref 3.87–5.11)
RDW: 17.6 % — ABNORMAL HIGH (ref 11.5–15.5)
WBC: 5.1 10*3/uL (ref 4.0–10.5)

## 2014-04-15 MED ORDER — ONDANSETRON 4 MG PO TBDP
4.0000 mg | ORAL_TABLET | Freq: Once | ORAL | Status: AC
  Administered 2014-04-15: 4 mg via ORAL
  Filled 2014-04-15: qty 1

## 2014-04-15 MED ORDER — HYDROCODONE-ACETAMINOPHEN 5-325 MG PO TABS
1.0000 | ORAL_TABLET | Freq: Once | ORAL | Status: AC
  Administered 2014-04-15: 1 via ORAL
  Filled 2014-04-15: qty 1

## 2014-04-15 NOTE — ED Notes (Addendum)
Pt sent by PCP.  C/o severe headache, blurred vision, dizziness, gait problems after an ARANESP infusion on 8/20.  Pain score 8/10.  Pt reports she gets these symptoms every time she has an infusion, but they normally go away after 3-4 days.  Sts she feels like she is getting worse instead of better.    Hx of fall w/ head injury x 3 months ago.  Pt has had 2 negative head CTs since fall.  No thinners.

## 2014-04-15 NOTE — Discharge Instructions (Signed)
You are having a headache. No specific cause was found today for your headache. It may have been a migraine or other cause of headache. Stress, anxiety, fatigue, and depression are common triggers for headaches. Your headache today does not appear to be life-threatening or require hospitalization, but often the exact cause of headaches is not determined in the emergency department. Therefore, follow-up with your doctor is very important to find out what may have caused your headache, and whether or not you need any further diagnostic testing or treatment. Sometimes headaches can appear benign (not harmful), but then more serious symptoms can develop which should prompt an immediate re-evaluation by your doctor or the emergency department. SEEK MEDICAL ATTENTION IF: You develop possible problems with medications prescribed.  The medications don't resolve your headache, if it recurs , or if you have multiple episodes of vomiting or can't take fluids. You have a change from the usual headache. RETURN IMMEDIATELY IF you develop a sudden, severe headache or confusion, become poorly responsive or faint, develop a fever above 100.92F or problem breathing, have a change in speech, vision, swallowing, or understanding, or develop new weakness, numbness, tingling, incoordination, or have a seizure.  General Headache Without Cause A headache is pain or discomfort felt around the head or neck area. The specific cause of a headache may not be found. There are many causes and types of headaches. A few common ones are:  Tension headaches.  Migraine headaches.  Cluster headaches.  Chronic daily headaches. HOME CARE INSTRUCTIONS   Keep all follow-up appointments with your caregiver or any specialist referral.  Only take over-the-counter or prescription medicines for pain or discomfort as directed by your caregiver.  Lie down in a dark, quiet room when you have a headache.  Keep a headache journal to find out  what may trigger your migraine headaches. For example, write down:  What you eat and drink.  How much sleep you get.  Any change to your diet or medicines.  Try massage or other relaxation techniques.  Put ice packs or heat on the head and neck. Use these 3 to 4 times per day for 15 to 20 minutes each time, or as needed.  Limit stress.  Sit up straight, and do not tense your muscles.  Quit smoking if you smoke.  Limit alcohol use.  Decrease the amount of caffeine you drink, or stop drinking caffeine.  Eat and sleep on a regular schedule.  Get 7 to 9 hours of sleep, or as recommended by your caregiver.  Keep lights dim if bright lights bother you and make your headaches worse. SEEK MEDICAL CARE IF:   You have problems with the medicines you were prescribed.  Your medicines are not working.  You have a change from the usual headache.  You have nausea or vomiting. SEEK IMMEDIATE MEDICAL CARE IF:   Your headache becomes severe.  You have a fever.  You have a stiff neck.  You have loss of vision.  You have muscular weakness or loss of muscle control.  You start losing your balance or have trouble walking.  You feel faint or pass out.  You have severe symptoms that are different from your first symptoms. MAKE SURE YOU:   Understand these instructions.  Will watch your condition.  Will get help right away if you are not doing well or get worse. Document Released: 08/01/2005 Document Revised: 10/24/2011 Document Reviewed: 08/17/2011 [facility] Patient Information 2015 [place], [place]. This information is not intended to  replace advice given to you by your health care provider. Make sure you discuss any questions you have with your health care provider. ° °

## 2014-04-15 NOTE — ED Notes (Signed)
Patient is alert and oriented x3.  She was given DC instructions and follow up visit instructions.  Patient gave verbal understanding. She was DC ambulatory under her own power to home.  V/S stable.  He was not showing any signs of distress on DC 

## 2014-04-15 NOTE — ED Provider Notes (Signed)
CSN: [id number]     Arrival date & time 04/15/14  1733 History   First MD Initiated Contact with Patient 04/15/14 1832     Chief Complaint  Patient presents with  . Headache  . Dizziness  . Gait Problem     (Consider location/radiation/quality/duration/timing/severity/associated sxs/prior Treatment) HPI  Tonya Mckee is a(n) 80 y.o. female who presents to the ED sent by her PA for evaluation of headaches. Specifically the PA is concerned for possible chronic Sub Dural vs. Normal pressure hydrocephalus as per her written note. The patient has a history of COPD and chronic anemia. She is followed by Dr. Elson Areas and take Darbopoeitin shots. She states that she normally has headaches and blurry vision afteh the shots for about 4 days which then sem to resolve, however she has had a daily bi- temporal headache since her shot 2 weeks ago.   Past Medical History  Diagnosis Date  . COPD (chronic obstructive pulmonary disease)   . Hypertension   . PONV (postoperative nausea and vomiting)   . Dysrhythmia     rapid heart rate  . Stroke 2010    mini stroke  . Pneumonia 2009  . Shortness of breath   . GERD (gastroesophageal reflux disease)     long time ago  . Depression   . DEMENTIA     borderline  . Depression 01/02/2012  . Anemia 01/02/2012  . Artery stenosis 10/30/12    Moderate to severe left extracranial vertebral  . Cancer 20years ago+ 1999    lt breast  left  . SCC (squamous cell carcinoma), leg     Right  LE Dr. [place] Crane  . Arthritis     Osteo-  Spine and Knees  . Vitamin B12 deficiency   . Vertigo, benign positional   . Memory loss   . Blepharospasm     Left eye  . Venous stasis ulcers 2012    Left LE due to trauma  . Cellulitis Jan 01, 2012    Left UE  . Carotid artery occlusion     Bruit- RIGHT  . Occlusion and stenosis of carotid artery without mention of cerebral infarction 11/06/2012  . Persistent headaches 02/26/2014   Past Surgical History  Procedure Laterality  Date  . Abdominal hysterectomy    . Appendectomy    . Breast surgery    . Joint replacement      Lt arm/wrist plates and screws  . Eye surgery      L/R catarects   Family History  Problem Relation Age of Onset  . Adopted: Yes   History  Substance Use Topics  . Smoking status: Current Every Day Smoker -- 1.00 packs/day for 68 years    Types: Cigarettes  . Smokeless tobacco: Never Used  . Alcohol Use: No   OB History   Grav Para Term Preterm Abortions TAB SAB Ect Mult Living                 Review of Systems    Allergies  Donepezil; Penicillins; Amoxapine and related; Doxycycline; Keflex; Bupropion; Ketek; Levaquin; Sulfa antibiotics; Tramadol; and Zyrtec  Home Medications   Prior to Admission medications   Medication Sig Start Date End Date Taking? Authorizing Provider  acetaminophen (TYLENOL) 325 MG tablet Take 650 mg by mouth every 6 (six) hours as needed (pain).    Yes Historical Provider, MD  albuterol (PROVENTIL HFA;VENTOLIN HFA) 108 (90 BASE) MCG/ACT inhaler Inhale 1-2 puffs into the lungs every 6 (six) hours  as needed for wheezing or shortness of breath (wheezing).   Yes Historical Provider, MD  calcium carbonate (TUMS - DOSED IN MG ELEMENTAL CALCIUM) 500 MG chewable tablet Chew 1 tablet by mouth daily as needed for indigestion or heartburn.   Yes Historical Provider, MD  Cholecalciferol (VITAMIN D PO) Take by mouth.   Yes Historical Provider, MD  Cyanocobalamin (VITAMIN B-12 CR PO) Take by mouth.   Yes Historical Provider, MD  darbepoetin (ARANESP) 300 MCG/0.6ML SOLN injection Inject 300 mcg into the skin once.   Yes Historical Provider, MD  FLUoxetine (PROZAC) 10 MG capsule Take 30 mg by mouth every morning.    Yes Historical Provider, MD  fluticasone (FLONASE) 50 MCG/ACT nasal spray Place 2 sprays into the nose daily as needed (allergies).    Yes Historical Provider, MD  ibuprofen (ADVIL,MOTRIN) 200 MG tablet Take 400 mg by mouth every 6 (six) hours as needed for  moderate pain.   Yes Historical Provider, MD  ipratropium-albuterol (DUONEB) 0.5-2.5 (3) MG/3ML SOLN Take 3 mLs by nebulization 3 (three) times daily.   Yes Historical Provider, MD  lisinopril (PRINIVIL,ZESTRIL) 10 MG tablet Take 10 mg by mouth daily.   Yes Historical Provider, MD  metoprolol succinate (TOPROL-XL) 50 MG 24 hr tablet Take 50 mg by mouth every evening. Take with or immediately following a meal.   Yes Historical Provider, MD  lisinopril (PRINIVIL,ZESTRIL) 10 MG tablet Take 20 mg by mouth every morning.  01/03/12 01/30/14  Bynum Bellows, MD  LORazepam (ATIVAN) 0.5 MG tablet Take 0.5 mg by mouth every 8 (eight) hours as needed (anxiety).     Historical Provider, MD   BP 161/67  Pulse 67  Temp(Src) 97.8 F (36.6 C) (Oral)  Resp 18  SpO2 96% Physical Exam  Constitutional: She is oriented to person, place, and time. She appears well-developed and well-nourished. No distress.  HENT:  Head: Normocephalic and atraumatic.  No temporal bruits  Eyes: Conjunctivae are normal. No scleral icterus.  Neck: Normal range of motion.  Cardiovascular: Normal rate, regular rhythm and normal heart sounds.  Exam reveals no gallop and no friction rub.   No murmur heard. Pulmonary/Chest: Effort normal and breath sounds normal. No respiratory distress.  Abdominal: Soft. Bowel sounds are normal. She exhibits no distension and no mass. There is no tenderness. There is no guarding.  Neurological: She is alert and oriented to person, place, and time. She has normal reflexes. No cranial nerve deficit. She exhibits normal muscle tone. Coordination normal.  .Speech is clear and goal oriented, follows commands Major Cranial nerves without deficit, no facial droop Normal strength in upper and lower extremities bilaterally including dorsiflexion and plantar flexion, strong and equal grip strength Sensation normal to light and sharp touch Moves extremities without ataxia, coordination intact Normal finger to  nose and rapid alternating movements Neg romberg, no pronator drift Normal gait Normal heel-shin and balance   Skin: Skin is warm and dry. She is not diaphoretic.    ED Course  Procedures (including critical care time) Labs Review Labs Reviewed  CBC WITH DIFFERENTIAL - Abnormal; Notable for the following:    RBC 2.55 (*)    Hemoglobin 9.0 (*)    HCT 26.3 (*)    MCV 103.1 (*)    MCH 35.3 (*)    RDW 17.6 (*)    Neutrophils Relative % 34 (*)    Lymphocytes Relative 50 (*)    Basophils Relative 2 (*)    All other components within normal  limits  COMPREHENSIVE METABOLIC PANEL - Abnormal; Notable for the following:    Glucose, Bld 129 (*)    GFR calc non Af Amer 80 (*)    All other components within normal limits    Imaging Review Ct Head Wo Contrast  04/15/2014   CLINICAL DATA:  severe Headache  EXAM: CT HEAD WITHOUT CONTRAST  TECHNIQUE: Contiguous axial images were obtained from the base of the skull through the vertex without intravenous contrast.  COMPARISON:  12/24/2013  FINDINGS: Diffuse parenchymal atrophy. Patchy areas of hypoattenuation in deep and periventricular white matter bilaterally. Negative for acute intracranial hemorrhage, mass lesion, acute infarction, midline shift, or mass-effect. Acute infarct may be inapparent on noncontrast CT. Ventricles and sulci symmetric. Bone windows demonstrate no focal lesion.  IMPRESSION: 1. Negative for bleed or other acute intracranial process. 2. Stable atrophy and nonspecific white matter changes.   Electronically Signed   By: Arne Cleveland M.D.   On: 04/15/2014 19:31     EKG Interpretation None      MDM   Final diagnoses:  Frequent headaches  Lymphocytosis   Patient with stable labs, leukocytosis, will have the patient discuss this with her hematologist. Patient'sCT is negative for any abnormalities. She has no focal neurologic deficits. No signs of NPH: no AMS, Wide based gate, urinary incontinence. Patient may be  having ha's secondary to her medication as it seems to occur after she gets darbopoeitin. Patient seen in shared visit with attending physician.  Pt HA treated and improved while in ED.  Presentation is like pts typical HA and non concerning for [facility], [facility], Meningitis, or temporal arteritis. Pt is afebrile with no focal neuro deficits, nuchal rigidity. Pt verbalizes understanding and is agreeable with plan to dc.     Margarita Mail, PA-C 04/18/14 1728

## 2014-04-17 ENCOUNTER — Other Ambulatory Visit: Payer: Self-pay | Admitting: Family Medicine

## 2014-04-17 DIAGNOSIS — R42 Dizziness and giddiness: Secondary | ICD-10-CM

## 2014-04-17 DIAGNOSIS — R404 Transient alteration of awareness: Secondary | ICD-10-CM

## 2014-04-22 NOTE — ED Provider Notes (Signed)
Medical screening examination/treatment/procedure(s) were conducted as a shared visit with non-physician practitioner(s) and myself.  I personally evaluated the patient during the encounter.  Pt c/o gradual onset frontal headache. No fevers. No neck stiffness or rigidity on exam. Spine nt. No temporal or sinus tenderness.    Mirna Mires, MD 04/22/14 [phone]

## 2014-04-24 ENCOUNTER — Ambulatory Visit (HOSPITAL_BASED_OUTPATIENT_CLINIC_OR_DEPARTMENT_OTHER): Payer: Medicare Other | Admitting: Hematology and Oncology

## 2014-04-24 ENCOUNTER — Ambulatory Visit (HOSPITAL_COMMUNITY)
Admission: RE | Admit: 2014-04-24 | Discharge: 2014-04-24 | Disposition: A | Discharge status: Home / Self Care | Payer: Medicare Other | Source: Ambulatory Visit | Attending: Hematology and Oncology | Admitting: Hematology and Oncology

## 2014-04-24 ENCOUNTER — Telehealth: Payer: Self-pay | Admitting: Hematology and Oncology

## 2014-04-24 ENCOUNTER — Ambulatory Visit (HOSPITAL_BASED_OUTPATIENT_CLINIC_OR_DEPARTMENT_OTHER): Payer: Medicare Other

## 2014-04-24 ENCOUNTER — Other Ambulatory Visit: Payer: Medicare Other

## 2014-04-24 ENCOUNTER — Ambulatory Visit: Payer: Medicare Other

## 2014-04-24 ENCOUNTER — Other Ambulatory Visit (HOSPITAL_BASED_OUTPATIENT_CLINIC_OR_DEPARTMENT_OTHER): Payer: Medicare Other

## 2014-04-24 VITALS — BP 159/92 | HR 57 | Temp 97.8°F | Resp 18

## 2014-04-24 VITALS — BP 131/49 | HR 58 | Temp 97.8°F | Resp 17 | Ht 61.0 in | Wt 122.3 lb

## 2014-04-24 DIAGNOSIS — D46C Myelodysplastic syndrome with isolated del(5q) chromosomal abnormality: Secondary | ICD-10-CM

## 2014-04-24 DIAGNOSIS — Z720 Tobacco use: Secondary | ICD-10-CM

## 2014-04-24 DIAGNOSIS — D649 Anemia, unspecified: Secondary | ICD-10-CM

## 2014-04-24 DIAGNOSIS — R519 Headache, unspecified: Secondary | ICD-10-CM

## 2014-04-24 DIAGNOSIS — D630 Anemia in neoplastic disease: Secondary | ICD-10-CM

## 2014-04-24 DIAGNOSIS — R51 Headache: Secondary | ICD-10-CM

## 2014-04-24 DIAGNOSIS — F17200 Nicotine dependence, unspecified, uncomplicated: Secondary | ICD-10-CM

## 2014-04-24 LAB — CBC & DIFF AND RETIC
BASO%: 1.7 % (ref 0.0–2.0)
Basophils Absolute: 0.1 10*3/uL (ref 0.0–0.1)
EOS%: 3.6 % (ref 0.0–7.0)
Eosinophils Absolute: 0.2 10*3/uL (ref 0.0–0.5)
HEMATOCRIT: 24.2 % — AB (ref 34.8–46.6)
HGB: 8 g/dL — ABNORMAL LOW (ref 11.6–15.9)
Immature Retic Fract: 8.6 % (ref 1.60–10.00)
LYMPH#: 2.1 10*3/uL (ref 0.9–3.3)
LYMPH%: 40.5 % (ref 14.0–49.7)
MCH: 34.6 pg — AB (ref 25.1–34.0)
MCHC: 33.1 g/dL (ref 31.5–36.0)
MCV: 104.8 fL — AB (ref 79.5–101.0)
MONO#: 0.5 10*3/uL (ref 0.1–0.9)
MONO%: 10.2 % (ref 0.0–14.0)
NEUT#: 2.3 10*3/uL (ref 1.5–6.5)
NEUT%: 44 % (ref 38.4–76.8)
PLATELETS: 269 10*3/uL (ref 145–400)
RBC: 2.31 10*6/uL — ABNORMAL LOW (ref 3.70–5.45)
RDW: 16.7 % — ABNORMAL HIGH (ref 11.2–14.5)
Retic %: 1.61 % (ref 0.70–2.10)
Retic Ct Abs: 37.19 10*3/uL (ref 33.70–90.70)
WBC: 5.2 10*3/uL (ref 3.9–10.3)
nRBC: 0 % (ref 0–0)

## 2014-04-24 LAB — HOLD TUBE, BLOOD BANK

## 2014-04-24 LAB — PREPARE RBC (CROSSMATCH)

## 2014-04-24 MED ORDER — SODIUM CHLORIDE 0.9 % IV SOLN
250.0000 mL | Freq: Once | INTRAVENOUS | Status: AC
  Administered 2014-04-24: 250 mL via INTRAVENOUS

## 2014-04-24 MED ORDER — ACETAMINOPHEN 325 MG PO TABS
650.0000 mg | ORAL_TABLET | Freq: Once | ORAL | Status: AC
  Administered 2014-04-24: 650 mg via ORAL

## 2014-04-24 MED ORDER — PREDNISONE 10 MG PO TABS: 10.0000 mg | ORAL_TABLET | Freq: Every day | ORAL | Status: DC

## 2014-04-24 MED ORDER — ACETAMINOPHEN 325 MG PO TABS
ORAL_TABLET | ORAL | Status: AC
  Filled 2014-04-24: qty 2

## 2014-04-24 MED ORDER — FUROSEMIDE 10 MG/ML IJ SOLN
20.0000 mg | Freq: Once | INTRAMUSCULAR | Status: AC
  Administered 2014-04-24: 20 mg via INTRAVENOUS

## 2014-04-24 NOTE — Assessment & Plan Note (Signed)
We discussed some of the risks, benefits, and alternatives of blood transfusions. The patient is symptomatic from anemia and the hemoglobin level is critically low.  Some of the side-effects to be expected including risks of transfusion reactions, chills, infection, syndrome of volume overload and risk of hospitalization from various reasons and the patient is willing to proceed and went ahead to sign consent today. I will proceed with 2 units of blood transfusion whenever he hemoglobin dropped to less than 8 g.

## 2014-04-24 NOTE — Telephone Encounter (Signed)
Pt confirmed labs/ov per 09/10 POF, gave pt AVS....KJ °

## 2014-04-24 NOTE — Progress Notes (Signed)
[facility] OFFICE PROGRESS NOTE  Patient Care Team: Carlos Levering, PA-C as PCP - General (Family Medicine) Lennon Alstrom, MD (Neurology) Carlos Levering, PA-C as Referring Physician (Family Medicine)  SUMMARY OF ONCOLOGIC HISTORY: She was found to have abnormal CBC from routine blood work. Starting around 2012, she has progressive anemia with hemoglobin and the lowest at 9.1.  She denies recent chest pain on exertion but complains of significant shortness of breath on minimal exertion. She has had pre-syncopal episodes and palpitations. She has profound fatigue and takes frequent naps. Recently, she had a fall.  Of note, the patient has 60 pack plus year history and recently was placed on oxygen therapy.  She never has EGD or screening colonoscopy.  She has been taking vitamin B12 supplements for a long time.  She has remote history of left breast cancer detected from mammogram. According to the patient, she had lumpectomy followed by radiation followed by tamoxifen. She does not know the stage of disease but she does not think the lymph nodes were involved.  The patient was prescribed oral iron supplements and she takes them regularly.  On 12/27/13: Bone marrow biopsy did not show evidence of leukemia or metastatic cancer. Cytogenetics and FISH analysis confirm myelodysplastic syndrome with deletion 5Q. The patient was started on erythropoietin stimulating agent to keep hemoglobin greater than 10 g. On 04/24/2014, we discontinue darbepoetin as the patient complained of side effects. INTERVAL HISTORY: Please see below for problem oriented charting. She complained of fatigue, shortness of breath on minimal exertion and daily headaches. She felt that the Aranesp injection is making her symptoms worse.  REVIEW OF SYSTEMS:   Constitutional: Denies fevers, chills or abnormal weight loss Eyes: Denies blurriness of vision Ears, nose, mouth, throat, and face: Denies mucositis or  sore throat Cardiovascular: Denies palpitation, chest discomfort or lower extremity swelling Gastrointestinal:  Denies nausea, heartburn or change in bowel habits Skin: Denies abnormal skin rashes Lymphatics: Denies new lymphadenopathy or easy bruising Neurological:Denies numbness, tingling or new weaknesses Behavioral/Psych: Mood is stable, no new changes  All other systems were reviewed with the patient and are negative.  I have reviewed the past medical history, past surgical history, social history and family history with the patient and they are unchanged from previous note.  ALLERGIES:  is allergic to donepezil; penicillins; amoxapine and related; doxycycline; keflex; bupropion; ketek; levaquin; sulfa antibiotics; tramadol; and zyrtec.  MEDICATIONS:  Current Outpatient Prescriptions  Medication Sig Dispense Refill  . acetaminophen (TYLENOL) 325 MG tablet Take 650 mg by mouth every 6 (six) hours as needed (pain).       Marland Kitchen albuterol (PROVENTIL HFA;VENTOLIN HFA) 108 (90 BASE) MCG/ACT inhaler Inhale 1-2 puffs into the lungs every 6 (six) hours as needed for wheezing or shortness of breath (wheezing).      . calcium carbonate (TUMS - DOSED IN MG ELEMENTAL CALCIUM) 500 MG chewable tablet Chew 1 tablet by mouth daily as needed for indigestion or heartburn.      . Cholecalciferol (VITAMIN D PO) Take by mouth.      . Cyanocobalamin (VITAMIN B-12 CR PO) Take by mouth.      Marland Kitchen FLUoxetine (PROZAC) 10 MG capsule Take 30 mg by mouth every morning.       . fluticasone (FLONASE) 50 MCG/ACT nasal spray Place 2 sprays into the nose daily as needed (allergies).       Marland Kitchen ibuprofen (ADVIL,MOTRIN) 200 MG tablet Take 400 mg by mouth every 6 (six) hours as  needed for moderate pain.      Marland Kitchen ipratropium-albuterol (DUONEB) 0.5-2.5 (3) MG/3ML SOLN Take 3 mLs by nebulization 3 (three) times daily.      Marland Kitchen lisinopril (PRINIVIL,ZESTRIL) 10 MG tablet Take 20 mg by mouth daily.       Marland Kitchen LORazepam (ATIVAN) 0.5 MG tablet  Take 0.5 mg by mouth every 8 (eight) hours as needed (anxiety).       . metoprolol succinate (TOPROL-XL) 50 MG 24 hr tablet Take 50 mg by mouth every evening. Take with or immediately following a meal.      . predniSONE (DELTASONE) 10 MG tablet Take 1 tablet (10 mg total) by mouth daily with breakfast.  30 tablet  0   No current facility-administered medications for this visit.    PHYSICAL EXAMINATION: ECOG PERFORMANCE STATUS: 2 - Symptomatic, <50% confined to bed  Filed Vitals:   04/24/14 1052  BP: 131/49  Pulse: 58  Temp: 97.8 F (36.6 C)  Resp: 17   Filed Weights   04/24/14 1052  Weight: 122 lb 4.8 oz (55.475 kg)    GENERAL:alert, no distress and comfortable SKIN: skin color, texture, turgor are normal, no rashes or significant lesions EYES: normal, Conjunctiva are pale and non-injected, sclera clear OROPHARYNX:no exudate, no erythema and lips, buccal mucosa, and tongue normal  Musculoskeletal:no cyanosis of digits and no clubbing  NEURO: alert & oriented x 3 with fluent speech, no focal motor/sensory deficits  LABORATORY DATA:  I have reviewed the data as listed    Component Value Date/Time   NA 139 04/15/2014 1901   NA 141 12/19/2013 0938   K 4.1 04/15/2014 1901   K 4.7 12/19/2013 0938   CL 102 04/15/2014 1901   CO2 28 04/15/2014 1901   CO2 27 12/19/2013 0938   GLUCOSE 129* 04/15/2014 1901   GLUCOSE 96 12/19/2013 0938   BUN 16 04/15/2014 1901   BUN 12.0 12/19/2013 0938   CREATININE 0.65 04/15/2014 1901   CREATININE 0.8 12/19/2013 0938   CALCIUM 8.5 04/15/2014 1901   CALCIUM 9.1 12/19/2013 0938   PROT 6.4 04/15/2014 1901   PROT 6.3* 12/19/2013 0938   ALBUMIN 3.8 04/15/2014 1901   ALBUMIN 3.5 12/19/2013 0938   AST 19 04/15/2014 1901   AST 12 12/19/2013 0938   ALT 9 04/15/2014 1901   ALT <6 12/19/2013 0938   ALKPHOS 63 04/15/2014 1901   ALKPHOS 67 12/19/2013 0938   BILITOT 0.6 04/15/2014 1901   BILITOT 0.64 12/19/2013 0938   GFRNONAA 80* 04/15/2014 1901   GFRAA >90 04/15/2014 1901    No results found for  this basename: SPEP,  UPEP,   kappa and lambda light chains    Lab Results  Component Value Date   WBC 5.2 04/24/2014   NEUTROABS 2.3 04/24/2014   HGB 8.0* 04/24/2014   HCT 24.2* 04/24/2014   MCV 104.8* 04/24/2014   PLT 269 04/24/2014      Chemistry      Component Value Date/Time   NA 139 04/15/2014 1901   NA 141 12/19/2013 0938   K 4.1 04/15/2014 1901   K 4.7 12/19/2013 0938   CL 102 04/15/2014 1901   CO2 28 04/15/2014 1901   CO2 27 12/19/2013 0938   BUN 16 04/15/2014 1901   BUN 12.0 12/19/2013 0938   CREATININE 0.65 04/15/2014 1901   CREATININE 0.8 12/19/2013 0938      Component Value Date/Time   CALCIUM 8.5 04/15/2014 1901   CALCIUM 9.1 12/19/2013 0938   ALKPHOS  63 04/15/2014 1901   ALKPHOS 67 12/19/2013 0938   AST 19 04/15/2014 1901   AST 12 12/19/2013 0938   ALT 9 04/15/2014 1901   ALT <6 12/19/2013 0938   BILITOT 0.6 04/15/2014 1901   BILITOT 0.64 12/19/2013 0938     ASSESSMENT & PLAN:  MDS (myelodysplastic syndrome) with 5q deletion The patient does not want Aranesp injection. I recommend transfusion support only right now. I have not started her on Revlimid but I am concerned about risk of thrombosis. The patient continues to smoke. Will like to try low-dose prednisone at 10 mg and reassess next month.  Persistent headaches This is likely exacerbated by chronic anemia. I recommend she quit smoking.  Tobacco abuse I spent some time counseling the patient the importance of tobacco cessation. She is currently not interested to quit now.   Anemia in neoplastic disease We discussed some of the risks, benefits, and alternatives of blood transfusions. The patient is symptomatic from anemia and the hemoglobin level is critically low.  Some of the side-effects to be expected including risks of transfusion reactions, chills, infection, syndrome of volume overload and risk of hospitalization from various reasons and the patient is willing to proceed and went ahead to sign consent today. I will proceed with 2  units of blood transfusion whenever he hemoglobin dropped to less than 8 g.       No orders of the defined types were placed in this encounter.   All questions were answered. The patient knows to call the clinic with any problems, questions or concerns. No barriers to learning was detected. I spent 30 minutes counseling the patient face to face. The total time spent in the appointment was 40 minutes and more than 50% was on counseling and review of test results     [facility], [place], MD 04/24/2014 11:56 AM

## 2014-04-24 NOTE — Patient Instructions (Addendum)
Blood Transfusion  A blood transfusion replaces your blood or some of its parts. Blood is replaced when you have lost blood because of surgery, an accident, or for severe blood conditions like anemia. You can donate blood to be used on yourself if you have a planned surgery. If you lose blood during that surgery, your own blood can be given back to you. Any blood given to you is checked to make sure it matches your blood type. Your temperature, blood pressure, and heart rate (vital signs) will be checked often.  GET HELP RIGHT AWAY IF:   You feel sick to your stomach (nauseous) or throw up (vomit).  You have watery poop (diarrhea).  You have shortness of breath or trouble breathing.  You have blood in your pee (urine) or have dark colored pee.  You have chest pain or tightness.  Your eyes or skin turn yellow (jaundice).  You have a temperature by mouth above 102 F (38.9 C), not controlled by medicine.  You start to shake and have chills.  You develop a a red rash (hives) or feel itchy.  You develop lightheadedness or feel confused.  You develop back, joint, or muscle pain.  You do not feel hungry (lost appetite).  You feel tired, restless, or nervous.  You develop belly (abdominal) cramps. Document Released: 10/28/2008 Document Revised: 10/24/2011 Document Reviewed: 10/28/2008 ExitCare Patient Information 2015 [employer]. This information is not intended to replace advice given to you by your health care provider. Make sure you discuss any questions you have with your health care provider. Blood Transfusion Information WHAT IS A BLOOD TRANSFUSION? A transfusion is the replacement of blood or some of its parts. Blood is made up of multiple cells which provide different functions.  Red blood cells carry oxygen and are used for blood loss replacement.  White blood cells fight against infection.  Platelets control bleeding.  Plasma helps clot blood.  Other blood  products are available for specialized needs, such as hemophilia or other clotting disorders. BEFORE THE TRANSFUSION  Who gives blood for transfusions?   You may be able to donate blood to be used at a later date on yourself (autologous donation).  Relatives can be asked to donate blood. This is generally not any safer than if you have received blood from a stranger. The same precautions are taken to ensure safety when a relative's blood is donated.  Healthy volunteers who are fully evaluated to make sure their blood is safe. This is blood bank blood. Transfusion therapy is the safest it has ever been in the practice of medicine. Before blood is taken from a donor, a complete history is taken to make sure that person has no history of diseases nor engages in risky social behavior (examples are intravenous drug use or sexual activity with multiple partners). The donor's travel history is screened to minimize risk of transmitting infections, such as malaria. The donated blood is tested for signs of infectious diseases, such as HIV and hepatitis. The blood is then tested to be sure it is compatible with you in order to minimize the chance of a transfusion reaction. If you or a relative donates blood, this is often done in anticipation of surgery and is not appropriate for emergency situations. It takes many days to process the donated blood. RISKS AND COMPLICATIONS Although transfusion therapy is very safe and saves many lives, the main dangers of transfusion include:   Getting an infectious disease.  Developing a transfusion reaction. This   is an allergic reaction to something in the blood you were given. Every precaution is taken to prevent this. The decision to have a blood transfusion has been considered carefully by your caregiver before blood is given. Blood is not given unless the benefits outweigh the risks. AFTER THE TRANSFUSION  Right after receiving a blood transfusion, you will usually  feel much better and more energetic. This is especially true if your red blood cells have gotten low (anemic). The transfusion raises the level of the red blood cells which carry oxygen, and this usually causes an energy increase.  The nurse administering the transfusion will monitor you carefully for complications. HOME CARE INSTRUCTIONS  No special instructions are needed after a transfusion. You may find your energy is better. Speak with your caregiver about any limitations on activity for underlying diseases you may have. SEEK MEDICAL CARE IF:   Your condition is not improving after your transfusion.  You develop redness or irritation at the intravenous (IV) site. SEEK IMMEDIATE MEDICAL CARE IF:  Any of the following symptoms occur over the next 12 hours:  Shaking chills.  You have a temperature by mouth above 102 F (38.9 C), not controlled by medicine.  Chest, back, or muscle pain.  People around you feel you are not acting correctly or are confused.  Shortness of breath or difficulty breathing.  Dizziness and fainting.  You get a rash or develop hives.  You have a decrease in urine output.  Your urine turns a dark color or changes to pink, red, or brown. Any of the following symptoms occur over the next 10 days:  You have a temperature by mouth above 102 F (38.9 C), not controlled by medicine.  Shortness of breath.  Weakness after normal activity.  The white part of the eye turns yellow (jaundice).  You have a decrease in the amount of urine or are urinating less often.  Your urine turns a dark color or changes to pink, red, or brown. Document Released: 07/29/2000 Document Revised: 10/24/2011 Document Reviewed: 03/17/2008 ExitCare Patient Information 2015 [employer]. This information is not intended to replace advice given to you by your health care provider. Make sure you discuss any questions you have with your health care provider.  

## 2014-04-24 NOTE — Assessment & Plan Note (Signed)
This is likely exacerbated by chronic anemia. I recommend she quit smoking.

## 2014-04-24 NOTE — Assessment & Plan Note (Signed)
I spent some time counseling the patient the importance of tobacco cessation. She is currently not interested to quit now. 

## 2014-04-24 NOTE — Assessment & Plan Note (Signed)
The patient does not want Aranesp injection. I recommend transfusion support only right now. I have not started her on Revlimid but I am concerned about risk of thrombosis. The patient continues to smoke. Will like to try low-dose prednisone at 10 mg and reassess next month.

## 2014-04-26 LAB — TYPE AND SCREEN
ABO/RH(D): O POS
Antibody Screen: NEGATIVE
UNIT DIVISION: 0
Unit division: 0

## 2014-04-27 ENCOUNTER — Encounter: Payer: Self-pay | Admitting: *Deleted

## 2014-04-28 ENCOUNTER — Inpatient Hospital Stay: Admission: RE | Admit: 2014-04-28 | Payer: Medicare Other | Source: Ambulatory Visit

## 2014-05-08 ENCOUNTER — Other Ambulatory Visit (HOSPITAL_BASED_OUTPATIENT_CLINIC_OR_DEPARTMENT_OTHER): Payer: Medicare Other

## 2014-05-08 ENCOUNTER — Telehealth: Payer: Self-pay | Admitting: *Deleted

## 2014-05-08 DIAGNOSIS — D46C Myelodysplastic syndrome with isolated del(5q) chromosomal abnormality: Secondary | ICD-10-CM

## 2014-05-08 DIAGNOSIS — D649 Anemia, unspecified: Secondary | ICD-10-CM

## 2014-05-08 LAB — CBC & DIFF AND RETIC
BASO%: 1.2 % (ref 0.0–2.0)
Basophils Absolute: 0.1 10*3/uL (ref 0.0–0.1)
EOS%: 2.6 % (ref 0.0–7.0)
Eosinophils Absolute: 0.2 10*3/uL (ref 0.0–0.5)
HCT: 30.9 % — ABNORMAL LOW (ref 34.8–46.6)
HGB: 10.1 g/dL — ABNORMAL LOW (ref 11.6–15.9)
Immature Retic Fract: 2.6 % (ref 1.60–10.00)
LYMPH#: 2.8 10*3/uL (ref 0.9–3.3)
LYMPH%: 43.8 % (ref 14.0–49.7)
MCH: 32.4 pg (ref 25.1–34.0)
MCHC: 32.7 g/dL (ref 31.5–36.0)
MCV: 99 fL (ref 79.5–101.0)
MONO#: 0.7 10*3/uL (ref 0.1–0.9)
MONO%: 10.5 % (ref 0.0–14.0)
NEUT#: 2.7 10*3/uL (ref 1.5–6.5)
NEUT%: 41.9 % (ref 38.4–76.8)
Platelets: 260 10*3/uL (ref 145–400)
RBC: 3.12 10*6/uL — ABNORMAL LOW (ref 3.70–5.45)
RDW: 16.6 % — ABNORMAL HIGH (ref 11.2–14.5)
RETIC CT ABS: 17.16 10*3/uL — AB (ref 33.70–90.70)
Retic %: 0.55 % — ABNORMAL LOW (ref 0.70–2.10)
WBC: 6.5 10*3/uL (ref 3.9–10.3)
nRBC: 0 % (ref 0–0)

## 2014-05-08 LAB — HOLD TUBE, BLOOD BANK

## 2014-05-08 NOTE — Telephone Encounter (Signed)
S/w pt and daughter in lobby.  Pt's Hgb is 10.1 today.  Pt states she feels better than she has in a long time.  She is able to walk down her driveway to the mailbox and hasn't been able to do this in a long time.   They confirmed next appt in 2 weeks.

## 2014-05-15 ENCOUNTER — Ambulatory Visit: Payer: Medicare Other | Admitting: Hematology and Oncology

## 2014-05-15 ENCOUNTER — Ambulatory Visit: Payer: Medicare Other

## 2014-05-22 ENCOUNTER — Telehealth: Payer: Self-pay | Admitting: Hematology and Oncology

## 2014-05-22 ENCOUNTER — Ambulatory Visit (HOSPITAL_BASED_OUTPATIENT_CLINIC_OR_DEPARTMENT_OTHER): Payer: Medicare Other | Admitting: Hematology and Oncology

## 2014-05-22 ENCOUNTER — Other Ambulatory Visit (HOSPITAL_BASED_OUTPATIENT_CLINIC_OR_DEPARTMENT_OTHER): Payer: Medicare Other

## 2014-05-22 VITALS — BP 128/55 | HR 57 | Temp 97.7°F | Resp 18 | Ht 61.0 in | Wt 121.0 lb

## 2014-05-22 DIAGNOSIS — R51 Headache: Secondary | ICD-10-CM

## 2014-05-22 DIAGNOSIS — D649 Anemia, unspecified: Secondary | ICD-10-CM

## 2014-05-22 DIAGNOSIS — D630 Anemia in neoplastic disease: Secondary | ICD-10-CM

## 2014-05-22 DIAGNOSIS — R519 Headache, unspecified: Secondary | ICD-10-CM

## 2014-05-22 DIAGNOSIS — Z720 Tobacco use: Secondary | ICD-10-CM

## 2014-05-22 DIAGNOSIS — D46C Myelodysplastic syndrome with isolated del(5q) chromosomal abnormality: Secondary | ICD-10-CM

## 2014-05-22 DIAGNOSIS — J449 Chronic obstructive pulmonary disease, unspecified: Secondary | ICD-10-CM

## 2014-05-22 LAB — CBC & DIFF AND RETIC
BASO%: 0.6 % (ref 0.0–2.0)
BASOS ABS: 0 10*3/uL (ref 0.0–0.1)
EOS ABS: 0.2 10*3/uL (ref 0.0–0.5)
EOS%: 2.6 % (ref 0.0–7.0)
HCT: 28.4 % — ABNORMAL LOW (ref 34.8–46.6)
HEMOGLOBIN: 9.4 g/dL — AB (ref 11.6–15.9)
Immature Retic Fract: 7.1 % (ref 1.60–10.00)
LYMPH%: 52.3 % — AB (ref 14.0–49.7)
MCH: 32.1 pg (ref 25.1–34.0)
MCHC: 33.1 g/dL (ref 31.5–36.0)
MCV: 96.9 fL (ref 79.5–101.0)
MONO#: 0.6 10*3/uL (ref 0.1–0.9)
MONO%: 8.9 % (ref 0.0–14.0)
NEUT%: 35.6 % — AB (ref 38.4–76.8)
NEUTROS ABS: 2.5 10*3/uL (ref 1.5–6.5)
PLATELETS: 252 10*3/uL (ref 145–400)
RBC: 2.93 10*6/uL — ABNORMAL LOW (ref 3.70–5.45)
RDW: 16.6 % — AB (ref 11.2–14.5)
Retic %: 0.9 % (ref 0.70–2.10)
Retic Ct Abs: 26.37 10*3/uL — ABNORMAL LOW (ref 33.70–90.70)
WBC: 7 10*3/uL (ref 3.9–10.3)
lymph#: 3.6 10*3/uL — ABNORMAL HIGH (ref 0.9–3.3)
nRBC: 0 % (ref 0–0)

## 2014-05-22 LAB — TECHNOLOGIST REVIEW

## 2014-05-22 LAB — HOLD TUBE, BLOOD BANK

## 2014-05-22 MED ORDER — PREDNISONE 5 MG PO TABS: 5.0000 mg | ORAL_TABLET | Freq: Every day | ORAL | Status: DC

## 2014-05-22 NOTE — Assessment & Plan Note (Signed)
We discussed some of the risks, benefits, and alternatives of blood transfusions. The patient is symptomatic from anemia and the hemoglobin level is critically low.  Some of the side-effects to be expected including risks of transfusion reactions, chills, infection, syndrome of volume overload and risk of hospitalization from various reasons and the patient is willing to proceed and went ahead to sign consent today. I will proceed with 2 units of blood transfusion whenever he hemoglobin dropped to less than 8 g.  She appears to be responding well to prednisone however it caused significant bruising. I recommend she reduce prednisone to 5 mg and to come back on a regular basis to have her blood checked.

## 2014-05-22 NOTE — Assessment & Plan Note (Signed)
Symptoms of shortness of breath has improved while on prednisone. I recommend smoking cessation

## 2014-05-22 NOTE — Patient Instructions (Signed)
Lenalidomide Oral Capsules What is this medicine? LENALIDOMIDE (len a LID oh mide) is a chemotherapy drug that targets specific proteins within cancer cells and stops the cancer cell from growing. It is used to treat multiple myeloma, mantle cell lymphoma, and some myelodysplastic syndromes that cause severe anemia requiring blood transfusions. This medicine may be used for other purposes; ask your health care provider or pharmacist if you have questions. COMMON BRAND NAME(S): Revlimid What should I tell my health care provider before I take this medicine? They need to know if you have any of these conditions: -blood clots in the legs or the lungs -high blood pressure -high cholesterol -infection -irregular monthly periods or menstrual cycles -kidney disease -liver disease -smoke tobacco -thyroid disease -an unusual or allergic reaction to lenalidomide, other medicines, foods, dyes, or preservatives -pregnant or trying to get pregnant -breast-feeding How should I use this medicine? Take this medicine by mouth with a glass of water. Follow the directions on the prescription label. Do not cut, crush, or chew this medicine. Take your medicine at regular intervals. Do not take it more often than directed. Do not stop taking except on your doctor's advice. A MedGuide will be given with each prescription and refill. Read this guide carefully each time. The MedGuide may change frequently. Talk to your pediatrician regarding the use of this medicine in children. Special care may be needed. Overdosage: If you think you have taken too much of this medicine contact a poison control center or emergency room at once. NOTE: This medicine is only for you. Do not share this medicine with others. What if I miss a dose? If you miss a dose, take it as soon as you can. If your next dose is to be taken in less than 12 hours, then do not take the missed dose. Take the next dose at your regular time. Do not take  double or extra doses. What may interact with this medicine? This medicine may interact with the following medications: -digoxin -medicines that increase the risk of thrombosis like estrogens or erythropoietic agents (e.g., epoetin alfa and darbepoetin alfa) -warfarin This list may not describe all possible interactions. Give your health care provider a list of all the medicines, herbs, non-prescription drugs, or dietary supplements you use. Also tell them if you smoke, drink alcohol, or use illegal drugs. Some items may interact with your medicine. What should I watch for while using this medicine? Visit your doctor for regular check ups. Tell your doctor or healthcare professional if your symptoms do not start to get better or if they get worse. You will need to have important blood work done while you are taking this medicine. This medicine is available only through a special program. Doctors, pharmacies, and patients must meet all of the conditions of the program. Your health care provider will help you get signed up with the program if you need this medicine. Through the program you will only receive up to a 28 day supply of the medicine at one time. You will need a new prescription for each refill. This medicine can cause birth defects. Do not get pregnant while taking this drug. Females with child-bearing potential will need to have 2 negative pregnancy tests before starting this medicine. Pregnancy testing must be done every 2 to 4 weeks as directed while taking this medicine. Use 2 reliable forms of birth control together while you are taking this medicine and for 1 month after you stop taking this medicine. If you  2 to 4 weeks as directed while taking this medicine. Use 2 reliable forms of birth control together while you are taking this medicine and for 1 month after you stop taking this medicine. If you think that you might be pregnant talk to your doctor right away.  Men must use a latex condom during sexual contact with a woman while taking this medicine and for 28 days after you stop taking this medicine. A latex condom is needed even if you have had a vasectomy. Contact your doctor  right away if your partner becomes pregnant. Do not donate sperm while taking this medicine and for 28 days after you stop taking this medicine.  Do not give blood while taking the medicine and for 1 month after completion of treatment to avoid exposing pregnant women to the medicine through the donated blood.  Talk to your doctor about your risk of cancer. You may be more at risk for certain types of cancers if you take this medicine.  What side effects may I notice from receiving this medicine?  Side effects that you should report to your doctor or health care professional as soon as possible:  -allergic reactions like skin rash, itching or hives, swelling of the face, lips, or tongue  -breathing problems  -chest pain or tightness  -fast, irregular heartbeat  -low blood counts - this medicine may decrease the number of white blood cells, red blood cells and platelets. You may be at increased risk for infections and bleeding.  -seizures  -signs and symptoms of bleeding such as bloody or black, tarry stools; red or dark-brown urine; spitting up blood or brown material that looks like coffee grounds; red spots on the skin; unusual bruising or bleeding from the eye, gums, or nose  -signs and symptoms of a blood clot such as breathing problems; changes in vision; chest pain; severe, sudden headache; pain, swelling, warmth in the leg; trouble speaking; sudden numbness or weakness of the face, arm or leg  -signs and symptoms of liver injury like dark yellow or brown urine; general ill feeling or flu-like symptoms; light-colored stools; loss of appetite; nausea; right upper belly pain; unusually weak or tired; yellowing of the eyes or skin  -signs and symptoms of a stroke like changes in vision; confusion; trouble speaking or understanding; severe headaches; sudden numbness or weakness of the face, arm or leg; trouble walking; dizziness; loss of balance or coordination  -sweating  -vomiting  Side effects that usually do  not require medical attention (report to your doctor or health care professional if they continue or are bothersome):  -constipation  -cough  -diarrhea  -tiredness  This list may not describe all possible side effects. Call your doctor for medical advice about side effects. You may report side effects to FDA at 1-800-FDA-1088.  Where should I keep my medicine?  Keep out of the reach of children.  Store at room temperature between 15 and 30 degrees C (59 and 86 degrees F). Throw away any unused medicine after the expiration date.  NOTE: This sheet is a summary. It may not cover all possible information. If you have questions about this medicine, talk to your doctor, pharmacist, or health care provider.   2015, [employer] Standard. (2013-11-05 18:30:01)

## 2014-05-22 NOTE — Assessment & Plan Note (Signed)
The patient does not want Aranesp injection. I recommend transfusion support only right now. I have not started her on Revlimid due to concerns about risk of thrombosis. The patient continues to smoke. She responded well to prednisone 10 mg daily but that caused significant bruising. Plan to reduce prednisone to 5 mg daily and recheck her blood in 10 days. Discuss with her the risks, benefits, side effects of Revlimid and gave her patient education handout. She will think about it.

## 2014-05-22 NOTE — Telephone Encounter (Signed)
gv and printed appt sched adn avs for pt for OCT and NOV.Tonya KitchenMarland Mckee

## 2014-05-22 NOTE — Progress Notes (Signed)
[facility] OFFICE PROGRESS NOTE  Patient Care Team: Carlos Levering, PA-C as PCP - General (Family Medicine) Lennon Alstrom, MD (Neurology) Carlos Levering, PA-C as Referring Physician (Family Medicine)  SUMMARY OF ONCOLOGIC HISTORY: She was found to have abnormal CBC from routine blood work. Starting around 2012, she has progressive anemia with hemoglobin and the lowest at 9.1.  She denies recent chest pain on exertion but complains of significant shortness of breath on minimal exertion. She has had pre-syncopal episodes and palpitations. She has profound fatigue and takes frequent naps. Recently, she had a fall.  Of note, the patient has 60 pack plus year history and recently was placed on oxygen therapy.  She never has EGD or screening colonoscopy.  She has been taking vitamin B12 supplements for a long time.  She has remote history of left breast cancer detected from mammogram. According to the patient, she had lumpectomy followed by radiation followed by tamoxifen. She does not know the stage of disease but she does not think the lymph nodes were involved.  The patient was prescribed oral iron supplements and she takes them regularly.  On 12/27/13: Bone marrow biopsy did not show evidence of leukemia or metastatic cancer. Cytogenetics and FISH analysis confirm myelodysplastic syndrome with deletion 5Q. The patient was started on erythropoietin stimulating agent to keep hemoglobin greater than 10 g. On 04/24/2014, we discontinued darbepoetin as the patient complained of side effects. She was started on prednisone 10 mg daily. On 05/22/14: Prednisone is tapered to 5 mg.  INTERVAL HISTORY: Please see below for problem oriented charting. Her energy level has improved. Shortness of breath has improved. She has occasional headaches. Her only major complaint is easy bruising on her legs  REVIEW OF SYSTEMS:   Constitutional: Denies fevers, chills or abnormal weight loss Eyes:  Denies blurriness of vision Ears, nose, mouth, throat, and face: Denies mucositis or sore throat Respiratory: Denies cough, dyspnea or wheezes Cardiovascular: Denies palpitation, chest discomfort or lower extremity swelling Gastrointestinal:  Denies nausea, heartburn or change in bowel habits Skin: Denies abnormal skin rashes Lymphatics: Denies new lymphadenopathy  Neurological:Denies numbness, tingling or new weaknesses Behavioral/Psych: Mood is stable, no new changes  All other systems were reviewed with the patient and are negative.  I have reviewed the past medical history, past surgical history, social history and family history with the patient and they are unchanged from previous note.  ALLERGIES:  is allergic to donepezil; penicillins; amoxapine and related; doxycycline; keflex; bupropion; ketek; levaquin; sulfa antibiotics; tramadol; and zyrtec.  MEDICATIONS:  Current Outpatient Prescriptions  Medication Sig Dispense Refill  . acetaminophen (TYLENOL) 325 MG tablet Take 650 mg by mouth every 6 (six) hours as needed (pain).       Marland Kitchen albuterol (PROVENTIL HFA;VENTOLIN HFA) 108 (90 BASE) MCG/ACT inhaler Inhale 1-2 puffs into the lungs every 6 (six) hours as needed for wheezing or shortness of breath (wheezing).      . calcium carbonate (TUMS - DOSED IN MG ELEMENTAL CALCIUM) 500 MG chewable tablet Chew 1 tablet by mouth daily as needed for indigestion or heartburn.      . Cholecalciferol (VITAMIN D PO) Take by mouth.      . Cyanocobalamin (VITAMIN B-12 CR PO) Take by mouth.      Marland Kitchen FLUoxetine (PROZAC) 10 MG capsule Take 30 mg by mouth every morning.       . fluticasone (FLONASE) 50 MCG/ACT nasal spray Place 2 sprays into the nose daily as needed (allergies).       Marland Kitchen  ibuprofen (ADVIL,MOTRIN) 200 MG tablet Take 400 mg by mouth every 6 (six) hours as needed for moderate pain.      Marland Kitchen ipratropium-albuterol (DUONEB) 0.5-2.5 (3) MG/3ML SOLN Take 3 mLs by nebulization 3 (three) times daily.       Marland Kitchen lisinopril (PRINIVIL,ZESTRIL) 10 MG tablet Take 20 mg by mouth daily.       Marland Kitchen LORazepam (ATIVAN) 0.5 MG tablet Take 0.5 mg by mouth every 8 (eight) hours as needed (anxiety).       . metoprolol succinate (TOPROL-XL) 50 MG 24 hr tablet Take 50 mg by mouth every evening. Take with or immediately following a meal.      . predniSONE (DELTASONE) 10 MG tablet Take 1 tablet (10 mg total) by mouth daily with breakfast.  30 tablet  0  . predniSONE (DELTASONE) 5 MG tablet Take 1 tablet (5 mg total) by mouth daily with breakfast.  30 tablet  1   No current facility-administered medications for this visit.    PHYSICAL EXAMINATION: ECOG PERFORMANCE STATUS: 1 - Symptomatic but completely ambulatory  Filed Vitals:   05/22/14 1038  BP: 128/55  Pulse: 57  Temp: 97.7 F (36.5 C)  Resp: 18   Filed Weights   05/22/14 1038  Weight: 121 lb (54.885 kg)    GENERAL:alert, no distress and comfortable SKIN: skin color is pale, texture, turgor are normal, no rashes or significant lesions. She had extensive bruises EYES: normal, Conjunctiva are pale and non-injected, sclera clear OROPHARYNX:no exudate, no erythema and lips, buccal mucosa, and tongue normal  NECK: supple, thyroid normal size, non-tender, without nodularity LYMPH:  no palpable lymphadenopathy in the cervical, axillary or inguinal LUNGS: clear to auscultation and percussion with normal breathing effort HEART: regular rate & rhythm and no murmurs and no lower extremity edema ABDOMEN:abdomen soft, non-tender and normal bowel sounds Musculoskeletal:no cyanosis of digits and no clubbing  NEURO: alert & oriented x 3 with fluent speech, no focal motor/sensory deficits  LABORATORY DATA:  I have reviewed the data as listed    Component Value Date/Time   NA 139 04/15/2014 1901   NA 141 12/19/2013 0938   K 4.1 04/15/2014 1901   K 4.7 12/19/2013 0938   CL 102 04/15/2014 1901   CO2 28 04/15/2014 1901   CO2 27 12/19/2013 0938   GLUCOSE 129* 04/15/2014 1901    GLUCOSE 96 12/19/2013 0938   BUN 16 04/15/2014 1901   BUN 12.0 12/19/2013 0938   CREATININE 0.65 04/15/2014 1901   CREATININE 0.8 12/19/2013 0938   CALCIUM 8.5 04/15/2014 1901   CALCIUM 9.1 12/19/2013 0938   PROT 6.4 04/15/2014 1901   PROT 6.3* 12/19/2013 0938   ALBUMIN 3.8 04/15/2014 1901   ALBUMIN 3.5 12/19/2013 0938   AST 19 04/15/2014 1901   AST 12 12/19/2013 0938   ALT 9 04/15/2014 1901   ALT <6 12/19/2013 0938   ALKPHOS 63 04/15/2014 1901   ALKPHOS 67 12/19/2013 0938   BILITOT 0.6 04/15/2014 1901   BILITOT 0.64 12/19/2013 0938   GFRNONAA 80* 04/15/2014 1901   GFRAA >90 04/15/2014 1901    No results found for this basename: SPEP,  UPEP,   kappa and lambda light chains    Lab Results  Component Value Date   WBC 7.0 05/22/2014   NEUTROABS 2.5 05/22/2014   HGB 9.4* 05/22/2014   HCT 28.4* 05/22/2014   MCV 96.9 05/22/2014   PLT 252 05/22/2014      Chemistry      Component Value  Date/Time   NA 139 04/15/2014 1901   NA 141 12/19/2013 0938   K 4.1 04/15/2014 1901   K 4.7 12/19/2013 0938   CL 102 04/15/2014 1901   CO2 28 04/15/2014 1901   CO2 27 12/19/2013 0938   BUN 16 04/15/2014 1901   BUN 12.0 12/19/2013 0938   CREATININE 0.65 04/15/2014 1901   CREATININE 0.8 12/19/2013 0938      Component Value Date/Time   CALCIUM 8.5 04/15/2014 1901   CALCIUM 9.1 12/19/2013 0938   ALKPHOS 63 04/15/2014 1901   ALKPHOS 67 12/19/2013 0938   AST 19 04/15/2014 1901   AST 12 12/19/2013 0938   ALT 9 04/15/2014 1901   ALT <6 12/19/2013 0938   BILITOT 0.6 04/15/2014 1901   BILITOT 0.64 12/19/2013 0938     ASSESSMENT & PLAN:  MDS (myelodysplastic syndrome) with 5q deletion The patient does not want Aranesp injection. I recommend transfusion support only right now. I have not started her on Revlimid due to concerns about risk of thrombosis. The patient continues to smoke. She responded well to prednisone 10 mg daily but that caused significant bruising. Plan to reduce prednisone to 5 mg daily and recheck her blood in 10 days. Discuss with her the risks,  benefits, side effects of Revlimid and gave her patient education handout. She will think about it.    Anemia in neoplastic disease We discussed some of the risks, benefits, and alternatives of blood transfusions. The patient is symptomatic from anemia and the hemoglobin level is critically low.  Some of the side-effects to be expected including risks of transfusion reactions, chills, infection, syndrome of volume overload and risk of hospitalization from various reasons and the patient is willing to proceed and went ahead to sign consent today. I will proceed with 2 units of blood transfusion whenever he hemoglobin dropped to less than 8 g.  She appears to be responding well to prednisone however it caused significant bruising. I recommend she reduce prednisone to 5 mg and to come back on a regular basis to have her blood checked.     COPD (chronic obstructive pulmonary disease) Symptoms of shortness of breath has improved while on prednisone. I recommend smoking cessation  Tobacco abuse I spent some time counseling the patient the importance of tobacco cessation. She is currently not interested to quit now.  Persistent headaches Likely exacerbated by anemia. She is at high risk of stroke. I recommend 81 mg aspirin daily.    No orders of the defined types were placed in this encounter.   All questions were answered. The patient knows to call the clinic with any problems, questions or concerns. No barriers to learning was detected. I spent 30 minutes counseling the patient face to face. The total time spent in the appointment was 40 minutes and more than 50% was on counseling and review of test results     [facility], [place], MD 05/22/2014 8:54 PM

## 2014-05-22 NOTE — Assessment & Plan Note (Signed)
Likely exacerbated by anemia. She is at high risk of stroke. I recommend 81 mg aspirin daily.

## 2014-05-22 NOTE — Assessment & Plan Note (Signed)
I spent some time counseling the patient the importance of tobacco cessation. She is currently not interested to quit now. 

## 2014-05-26 ENCOUNTER — Encounter: Payer: Self-pay | Admitting: *Deleted

## 2014-05-26 ENCOUNTER — Ambulatory Visit (HOSPITAL_BASED_OUTPATIENT_CLINIC_OR_DEPARTMENT_OTHER): Payer: Medicare Other | Admitting: Hematology and Oncology

## 2014-05-26 ENCOUNTER — Other Ambulatory Visit (HOSPITAL_BASED_OUTPATIENT_CLINIC_OR_DEPARTMENT_OTHER): Payer: Medicare Other

## 2014-05-26 ENCOUNTER — Encounter: Payer: Self-pay | Admitting: Hematology and Oncology

## 2014-05-26 ENCOUNTER — Telehealth: Payer: Self-pay | Admitting: *Deleted

## 2014-05-26 ENCOUNTER — Other Ambulatory Visit: Payer: Self-pay | Admitting: *Deleted

## 2014-05-26 VITALS — BP 126/56 | HR 58 | Temp 97.6°F | Wt 120.6 lb

## 2014-05-26 DIAGNOSIS — D46C Myelodysplastic syndrome with isolated del(5q) chromosomal abnormality: Secondary | ICD-10-CM

## 2014-05-26 DIAGNOSIS — R51 Headache: Secondary | ICD-10-CM

## 2014-05-26 DIAGNOSIS — D630 Anemia in neoplastic disease: Secondary | ICD-10-CM

## 2014-05-26 DIAGNOSIS — J438 Other emphysema: Secondary | ICD-10-CM

## 2014-05-26 DIAGNOSIS — D649 Anemia, unspecified: Secondary | ICD-10-CM

## 2014-05-26 DIAGNOSIS — R519 Headache, unspecified: Secondary | ICD-10-CM

## 2014-05-26 DIAGNOSIS — J449 Chronic obstructive pulmonary disease, unspecified: Secondary | ICD-10-CM

## 2014-05-26 DIAGNOSIS — Z720 Tobacco use: Secondary | ICD-10-CM

## 2014-05-26 LAB — CBC & DIFF AND RETIC
BASO%: 0.5 % (ref 0.0–2.0)
BASOS ABS: 0 10*3/uL (ref 0.0–0.1)
EOS ABS: 0.1 10*3/uL (ref 0.0–0.5)
EOS%: 1.7 % (ref 0.0–7.0)
HEMATOCRIT: 27.4 % — AB (ref 34.8–46.6)
HGB: 9.1 g/dL — ABNORMAL LOW (ref 11.6–15.9)
IMMATURE RETIC FRACT: 3.9 % (ref 1.60–10.00)
LYMPH#: 1.2 10*3/uL (ref 0.9–3.3)
LYMPH%: 18.1 % (ref 14.0–49.7)
MCH: 32.3 pg (ref 25.1–34.0)
MCHC: 33.2 g/dL (ref 31.5–36.0)
MCV: 97.2 fL (ref 79.5–101.0)
MONO#: 0.4 10*3/uL (ref 0.1–0.9)
MONO%: 6.1 % (ref 0.0–14.0)
NEUT%: 73.6 % (ref 38.4–76.8)
NEUTROS ABS: 4.7 10*3/uL (ref 1.5–6.5)
Platelets: 244 10*3/uL (ref 145–400)
RBC: 2.82 10*6/uL — ABNORMAL LOW (ref 3.70–5.45)
RDW: 16.7 % — AB (ref 11.2–14.5)
RETIC %: 0.89 % (ref 0.70–2.10)
RETIC CT ABS: 25.1 10*3/uL — AB (ref 33.70–90.70)
WBC: 6.4 10*3/uL (ref 3.9–10.3)
nRBC: 0 % (ref 0–0)

## 2014-05-26 LAB — HOLD TUBE, BLOOD BANK

## 2014-05-26 NOTE — Assessment & Plan Note (Signed)
Headaches is worse since she initiated prednisone taper. Suspect she may have poor oxygenation of her lungs once we initiated prednisone taper. I recommend consideration for round the clock oxygen therapy. If her headaches is not better, I may have to order imaging study to exclude other causes of headache.

## 2014-05-26 NOTE — Telephone Encounter (Signed)
Daughter called to say patient "feels like she is dying, hurting all over, severe headache". Instructed to come in at 230 for lab and then see Dr Alvy Bimler

## 2014-05-26 NOTE — Assessment & Plan Note (Signed)
She is oxygen dependent at nighttime. I recommend she considers followup with PCP for repeat evaluation and possible oxygen around-the-clock during daytime as well

## 2014-05-26 NOTE — Assessment & Plan Note (Signed)
I spent some time counseling the patient the importance of tobacco cessation. She is currently not interested to quit now. 

## 2014-05-26 NOTE — Assessment & Plan Note (Signed)
We discussed some of the risks, benefits, and alternatives of blood transfusions. The patient is symptomatic from anemia and the hemoglobin level is critically low.  Some of the side-effects to be expected including risks of transfusion reactions, chills, infection, syndrome of volume overload and risk of hospitalization from various reasons and the patient is willing to proceed and went ahead to sign consent today. I will proceed with 2 units of blood transfusion whenever her hemoglobin dropped to less than 8 g.

## 2014-05-26 NOTE — Assessment & Plan Note (Signed)
The patient does not want Aranesp injection. I recommend transfusion support only right now. I have not started her on Revlimid due to concerns about risk of thrombosis. The patient continues to smoke. She responded well to prednisone 10 mg daily but that caused significant bruising. Plan to reduce prednisone to 5 mg daily but she tolerated the prednisone taper poorly.  I recommend she increase prednisone back to 10 mg for the next 3 days and to be reinitiate taper every other day after that if she feels that her symptoms has improved. The patient does not want treatment with Revlimid for fear that it may cause risk of thrombosis.

## 2014-05-26 NOTE — Progress Notes (Signed)
[facility] OFFICE PROGRESS NOTE  Patient Care Team: Carlos Levering, PA-C as PCP - General (Family Medicine) Lennon Alstrom, MD (Neurology) Carlos Levering, PA-C as Referring Physician (Family Medicine)  SUMMARY OF ONCOLOGIC HISTORY: She was found to have abnormal CBC from routine blood work. Starting around 2012, she has progressive anemia with hemoglobin and the lowest at 9.1.  She denies recent chest pain on exertion but complains of significant shortness of breath on minimal exertion. She has had pre-syncopal episodes and palpitations. She has profound fatigue and takes frequent naps. Recently, she had a fall.  Of note, the patient has 60 pack plus year history and recently was placed on oxygen therapy.  She never has EGD or screening colonoscopy.  She has been taking vitamin B12 supplements for a long time.  She has remote history of left breast cancer detected from mammogram. According to the patient, she had lumpectomy followed by radiation followed by tamoxifen. She does not know the stage of disease but she does not think the lymph nodes were involved.  The patient was prescribed oral iron supplements and she takes them regularly.  On 12/27/13: Bone marrow biopsy did not show evidence of leukemia or metastatic cancer. Cytogenetics and FISH analysis confirm myelodysplastic syndrome with deletion 5Q. The patient was started on erythropoietin stimulating agent to keep hemoglobin greater than 10 g. On 04/15/14: CT head was negative On 04/24/2014, we discontinued darbepoetin as the patient complained of side effects. She was started on prednisone 10 mg daily. On 05/22/14: Prednisone is tapered to 5 mg.  INTERVAL HISTORY: Please see below for problem oriented charting. She is seen today because of uncontrolled and severe headache She also complained of poor energy and appetite since initiation of prednisone taper REVIEW OF SYSTEMS:   Constitutional: Denies fevers, chills or  abnormal weight loss Eyes: Denies blurriness of vision Ears, nose, mouth, throat, and face: Denies mucositis or sore throat Respiratory: Denies cough, dyspnea or wheezes Cardiovascular: Denies palpitation, chest discomfort or lower extremity swelling Gastrointestinal:  Denies nausea, heartburn or change in bowel habits Skin: Denies abnormal skin rashes Lymphatics: Denies new lymphadenopathy or easy bruising Neurological:Denies numbness, tingling or new weaknesses Behavioral/Psych: Mood is stable, no new changes  All other systems were reviewed with the patient and are negative.  I have reviewed the past medical history, past surgical history, social history and family history with the patient and they are unchanged from previous note.  ALLERGIES:  is allergic to donepezil; penicillins; amoxapine and related; doxycycline; keflex; bupropion; ketek; levaquin; sulfa antibiotics; tramadol; and zyrtec.  MEDICATIONS:  Current Outpatient Prescriptions  Medication Sig Dispense Refill  . acetaminophen (TYLENOL) 325 MG tablet Take 650 mg by mouth every 6 (six) hours as needed (pain).       Marland Kitchen albuterol (PROVENTIL HFA;VENTOLIN HFA) 108 (90 BASE) MCG/ACT inhaler Inhale 1-2 puffs into the lungs every 6 (six) hours as needed for wheezing or shortness of breath (wheezing).      . calcium carbonate (TUMS - DOSED IN MG ELEMENTAL CALCIUM) 500 MG chewable tablet Chew 1 tablet by mouth daily as needed for indigestion or heartburn.      . Cholecalciferol (VITAMIN D PO) Take by mouth.      . Cyanocobalamin (VITAMIN B-12 CR PO) Take by mouth.      Marland Kitchen FLUoxetine (PROZAC) 10 MG capsule Take 30 mg by mouth every morning.       . fluticasone (FLONASE) 50 MCG/ACT nasal spray Place 2 sprays into the  nose daily as needed (allergies).       Marland Kitchen ibuprofen (ADVIL,MOTRIN) 200 MG tablet Take 400 mg by mouth every 6 (six) hours as needed for moderate pain.      Marland Kitchen ipratropium-albuterol (DUONEB) 0.5-2.5 (3) MG/3ML SOLN Take 3 mLs  by nebulization 3 (three) times daily.      Marland Kitchen lisinopril (PRINIVIL,ZESTRIL) 10 MG tablet Take 20 mg by mouth daily.       Marland Kitchen LORazepam (ATIVAN) 0.5 MG tablet Take 0.5 mg by mouth every 8 (eight) hours as needed (anxiety).       . metoprolol succinate (TOPROL-XL) 50 MG 24 hr tablet Take 50 mg by mouth every evening. Take with or immediately following a meal.      . predniSONE (DELTASONE) 5 MG tablet Take 1 tablet (5 mg total) by mouth daily with breakfast.  30 tablet  1   No current facility-administered medications for this visit.    PHYSICAL EXAMINATION: ECOG PERFORMANCE STATUS: 1 - Symptomatic but completely ambulatory  Filed Vitals:   05/26/14 1559  BP: 126/56  Pulse: 58  Temp: 97.6 F (36.4 C)   Filed Weights   05/26/14 1559  Weight: 120 lb 9.6 oz (54.704 kg)    GENERAL:alert, no distress and comfortable. She looked elderly SKIN: skin color is pale, texture, turgor are normal, no rashes or significant lesions EYES: normal, Conjunctiva are pale and non-injected, sclera clear OROPHARYNX:no exudate, no erythema and lips, buccal mucosa, and tongue normal  Musculoskeletal:no cyanosis of digits and no clubbing  NEURO: alert & oriented x 3 with fluent speech, no focal motor/sensory deficits  LABORATORY DATA:  I have reviewed the data as listed    Component Value Date/Time   NA 139 04/15/2014 1901   NA 141 12/19/2013 0938   K 4.1 04/15/2014 1901   K 4.7 12/19/2013 0938   CL 102 04/15/2014 1901   CO2 28 04/15/2014 1901   CO2 27 12/19/2013 0938   GLUCOSE 129* 04/15/2014 1901   GLUCOSE 96 12/19/2013 0938   BUN 16 04/15/2014 1901   BUN 12.0 12/19/2013 0938   CREATININE 0.65 04/15/2014 1901   CREATININE 0.8 12/19/2013 0938   CALCIUM 8.5 04/15/2014 1901   CALCIUM 9.1 12/19/2013 0938   PROT 6.4 04/15/2014 1901   PROT 6.3* 12/19/2013 0938   ALBUMIN 3.8 04/15/2014 1901   ALBUMIN 3.5 12/19/2013 0938   AST 19 04/15/2014 1901   AST 12 12/19/2013 0938   ALT 9 04/15/2014 1901   ALT <6 12/19/2013 0938   ALKPHOS 63 04/15/2014  1901   ALKPHOS 67 12/19/2013 0938   BILITOT 0.6 04/15/2014 1901   BILITOT 0.64 12/19/2013 0938   GFRNONAA 80* 04/15/2014 1901   GFRAA >90 04/15/2014 1901    No results found for this basename: SPEP,  UPEP,   kappa and lambda light chains    Lab Results  Component Value Date   WBC 6.4 05/26/2014   NEUTROABS 4.7 05/26/2014   HGB 9.1* 05/26/2014   HCT 27.4* 05/26/2014   MCV 97.2 05/26/2014   PLT 244 05/26/2014      Chemistry      Component Value Date/Time   NA 139 04/15/2014 1901   NA 141 12/19/2013 0938   K 4.1 04/15/2014 1901   K 4.7 12/19/2013 0938   CL 102 04/15/2014 1901   CO2 28 04/15/2014 1901   CO2 27 12/19/2013 0938   BUN 16 04/15/2014 1901   BUN 12.0 12/19/2013 0938   CREATININE 0.65 04/15/2014 1901  CREATININE 0.8 12/19/2013 0938      Component Value Date/Time   CALCIUM 8.5 04/15/2014 1901   CALCIUM 9.1 12/19/2013 0938   ALKPHOS 63 04/15/2014 1901   ALKPHOS 67 12/19/2013 0938   AST 19 04/15/2014 1901   AST 12 12/19/2013 0938   ALT 9 04/15/2014 1901   ALT <6 12/19/2013 0938   BILITOT 0.6 04/15/2014 1901   BILITOT 0.64 12/19/2013 0938     ASSESSMENT & PLAN:  MDS (myelodysplastic syndrome) with 5q deletion The patient does not want Aranesp injection. I recommend transfusion support only right now. I have not started her on Revlimid due to concerns about risk of thrombosis. The patient continues to smoke. She responded well to prednisone 10 mg daily but that caused significant bruising. Plan to reduce prednisone to 5 mg daily but she tolerated the prednisone taper poorly.  I recommend she increase prednisone back to 10 mg for the next 3 days and to be reinitiate taper every other day after that if she feels that her symptoms has improved. The patient does not want treatment with Revlimid for fear that it may cause risk of thrombosis.  COPD (chronic obstructive pulmonary disease) She is oxygen dependent at nighttime. I recommend she considers followup with PCP for repeat evaluation and possible oxygen  around-the-clock during daytime as well   Tobacco abuse I spent some time counseling the patient the importance of tobacco cessation. She is currently not interested to quit now.    Persistent headaches Headaches is worse since she initiated prednisone taper. Suspect she may have poor oxygenation of her lungs once we initiated prednisone taper. I recommend consideration for round the clock oxygen therapy. If her headaches is not better, I may have to order imaging study to exclude other causes of headache.  Anemia in neoplastic disease We discussed some of the risks, benefits, and alternatives of blood transfusions. The patient is symptomatic from anemia and the hemoglobin level is critically low.  Some of the side-effects to be expected including risks of transfusion reactions, chills, infection, syndrome of volume overload and risk of hospitalization from various reasons and the patient is willing to proceed and went ahead to sign consent today. I will proceed with 2 units of blood transfusion whenever her hemoglobin dropped to less than 8 g.       No orders of the defined types were placed in this encounter.   All questions were answered. The patient knows to call the clinic with any problems, questions or concerns. No barriers to learning was detected. I spent 25 minutes counseling the patient face to face. The total time spent in the appointment was 30 minutes and more than 50% was on counseling and review of test results     [facility], [place], MD 05/26/2014 7:16 PM

## 2014-06-02 ENCOUNTER — Other Ambulatory Visit: Payer: Self-pay | Admitting: Nurse Practitioner

## 2014-06-02 ENCOUNTER — Telehealth: Payer: Self-pay | Admitting: *Deleted

## 2014-06-02 ENCOUNTER — Other Ambulatory Visit (HOSPITAL_BASED_OUTPATIENT_CLINIC_OR_DEPARTMENT_OTHER): Payer: Medicare Other

## 2014-06-02 ENCOUNTER — Other Ambulatory Visit: Payer: Self-pay | Admitting: *Deleted

## 2014-06-02 ENCOUNTER — Other Ambulatory Visit: Payer: Self-pay | Admitting: Hematology and Oncology

## 2014-06-02 DIAGNOSIS — D649 Anemia, unspecified: Secondary | ICD-10-CM

## 2014-06-02 DIAGNOSIS — D630 Anemia in neoplastic disease: Secondary | ICD-10-CM

## 2014-06-02 DIAGNOSIS — D46C Myelodysplastic syndrome with isolated del(5q) chromosomal abnormality: Secondary | ICD-10-CM

## 2014-06-02 LAB — CBC & DIFF AND RETIC
BASO%: 0.5 % (ref 0.0–2.0)
Basophils Absolute: 0 10*3/uL (ref 0.0–0.1)
EOS%: 2.2 % (ref 0.0–7.0)
Eosinophils Absolute: 0.2 10*3/uL (ref 0.0–0.5)
HCT: 26.1 % — ABNORMAL LOW (ref 34.8–46.6)
HGB: 8.6 g/dL — ABNORMAL LOW (ref 11.6–15.9)
IMMATURE RETIC FRACT: 7.2 % (ref 1.60–10.00)
LYMPH%: 34.2 % (ref 14.0–49.7)
MCH: 32 pg (ref 25.1–34.0)
MCHC: 33 g/dL (ref 31.5–36.0)
MCV: 97 fL (ref 79.5–101.0)
MONO#: 0.6 10*3/uL (ref 0.1–0.9)
MONO%: 7.9 % (ref 0.0–14.0)
NEUT#: 4.5 10*3/uL (ref 1.5–6.5)
NEUT%: 55.2 % (ref 38.4–76.8)
NRBC: 0 % (ref 0–0)
Platelets: 203 10*3/uL (ref 145–400)
RBC: 2.69 10*6/uL — AB (ref 3.70–5.45)
RDW: 16.8 % — AB (ref 11.2–14.5)
RETIC %: 0.9 % (ref 0.70–2.10)
Retic Ct Abs: 24.21 10*3/uL — ABNORMAL LOW (ref 33.70–90.70)
WBC: 8.1 10*3/uL (ref 3.9–10.3)
lymph#: 2.8 10*3/uL (ref 0.9–3.3)

## 2014-06-02 LAB — HOLD TUBE, BLOOD BANK

## 2014-06-02 MED ORDER — PREDNISONE 5 MG PO TABS: 7.5000 mg | ORAL_TABLET | Freq: Every day | ORAL | Status: DC

## 2014-06-02 MED ORDER — LENALIDOMIDE 2.5 MG PO CAPS: 2.5000 mg | ORAL_CAPSULE | Freq: Every day | ORAL | Status: DC

## 2014-06-02 NOTE — Telephone Encounter (Signed)
Spoke to patient's daughter, [name], about starting process for Revlimid. She states the patient is not yet ready to start and wants to think about it, citing being nervous about potential side effects. RN informed daughter that in order to begin, we will need pt to come in to sign consent forms in person. Also informed that if/when she does start Revlimid, pt will need to begin taking 81 mg aspirin. Pt has appt 06/23/14. Patient's daughter verbalizes understanding of the above and will discuss this with the pt. She will call our office when ready to sign forms to begin process.

## 2014-06-02 NOTE — Telephone Encounter (Signed)
If she is ready, we can get the preauth now and she can start ASAP. We will keep the 11/9 appt for toxicity review. She needs to be on 81 mg Aspirin while on Revlimid

## 2014-06-02 NOTE — Telephone Encounter (Signed)
Spoke w/ pt and daughter in lobby at length.  Pt c/o having no energy,  Feeling sleepy all the time. Informed her of Hgb  8.6 today and no need for transfusion.  Pt currently taking 10 mg prednisone.  Dr. Alvy Bimler instructed to decrease to 7.5 mg daily.  Pt has 5 mg tablets left with one refill.  Instructed to take 1 1/2 tablets daily to equal 7.5 mg.  Pt and daughter verbalized understanding. Pt states she is interested in starting on "chemo pill" as discussed w/ Dr. Alvy Bimler.  Daughter asks if next office visit can be moved up sooner than scheduled now on 11/09 to get started on pill sooner?

## 2014-06-09 ENCOUNTER — Telehealth: Payer: Self-pay | Admitting: *Deleted

## 2014-06-09 ENCOUNTER — Other Ambulatory Visit (HOSPITAL_BASED_OUTPATIENT_CLINIC_OR_DEPARTMENT_OTHER): Payer: Medicare Other

## 2014-06-09 ENCOUNTER — Encounter: Payer: Self-pay | Admitting: Hematology and Oncology

## 2014-06-09 ENCOUNTER — Other Ambulatory Visit: Payer: Self-pay | Admitting: *Deleted

## 2014-06-09 DIAGNOSIS — D46C Myelodysplastic syndrome with isolated del(5q) chromosomal abnormality: Secondary | ICD-10-CM

## 2014-06-09 DIAGNOSIS — D649 Anemia, unspecified: Secondary | ICD-10-CM

## 2014-06-09 LAB — CBC & DIFF AND RETIC
BASO%: 0.4 % (ref 0.0–2.0)
Basophils Absolute: 0 10*3/uL (ref 0.0–0.1)
EOS%: 2 % (ref 0.0–7.0)
Eosinophils Absolute: 0.2 10*3/uL (ref 0.0–0.5)
HEMATOCRIT: 25.1 % — AB (ref 34.8–46.6)
HGB: 8.4 g/dL — ABNORMAL LOW (ref 11.6–15.9)
Immature Retic Fract: 7 % (ref 1.60–10.00)
LYMPH%: 42.2 % (ref 14.0–49.7)
MCH: 32.6 pg (ref 25.1–34.0)
MCHC: 33.5 g/dL (ref 31.5–36.0)
MCV: 97.3 fL (ref 79.5–101.0)
MONO#: 0.6 10*3/uL (ref 0.1–0.9)
MONO%: 7.7 % (ref 0.0–14.0)
NEUT#: 3.5 10*3/uL (ref 1.5–6.5)
NEUT%: 47.7 % (ref 38.4–76.8)
NRBC: 0 % (ref 0–0)
Platelets: 244 10*3/uL (ref 145–400)
RBC: 2.58 10*6/uL — AB (ref 3.70–5.45)
RDW: 17.4 % — ABNORMAL HIGH (ref 11.2–14.5)
Retic %: 0.97 % (ref 0.70–2.10)
Retic Ct Abs: 25.03 10*3/uL — ABNORMAL LOW (ref 33.70–90.70)
WBC: 7.4 10*3/uL (ref 3.9–10.3)
lymph#: 3.1 10*3/uL (ref 0.9–3.3)

## 2014-06-09 LAB — HOLD TUBE, BLOOD BANK

## 2014-06-09 NOTE — Telephone Encounter (Signed)
Pt and daughter in lobby.  Informed pt no need for transfusion. Her Hgb is stable at 8.4.  Pt asks why does she feel so bad?  She c/o feeling extremely fatigued.  No energy at all.  She also has blurry vision.   Informed Dr. Alvy Bimler.  She instructs for pt to see Opthalmology for blurry vision.  I also suggested to pt she see her PCP to r/o any blood presssure or blood sugar problems.  Instructed to continue same dose of Prednisone until next visit.  Pt signed consent forms for Revlimid.

## 2014-06-09 NOTE — Progress Notes (Signed)
Faxed revlimid pa form to [employer]

## 2014-06-09 NOTE — Telephone Encounter (Signed)
Pt enrolled in [employer] for Revlimid. Josem Kaufmann #[id number].  Rx given to Carmelina Noun in managed care dept for prior auth.

## 2014-06-11 NOTE — Progress Notes (Signed)
Received fax from [place] that Revlimid has been approved through 06/10/2015 under Medicare D benefit. Forwarded to managed care.

## 2014-06-12 NOTE — Progress Notes (Signed)
RECEIVED A FAX FROM [employer] CONCERNING A CONFIRMATION OF PRESCRIPTION SHIPMENT FOR REVLIMID ON 06/11/14.

## 2014-06-13 ENCOUNTER — Telehealth: Payer: Self-pay | Admitting: *Deleted

## 2014-06-13 NOTE — Telephone Encounter (Signed)
Daughter left Vm requests nurse calls back.  Called back and left her a VM to return call again.

## 2014-06-13 NOTE — Telephone Encounter (Signed)
Daughter reports pt got Revlimid and will start it tomorrow morning.  Pt also saw her PCP yesterday.  She is referred to see Pulmonary MD at [facility].  They are also ordering oxygen for pt to use at home.  Apparently her oxygen sat dropped to 81% w/ ambulating.   Instructed dau for pt to keep lab on Monday as scheduled and call if any problems with the revlimid. She verbalized understanding.

## 2014-06-16 ENCOUNTER — Encounter: Payer: Self-pay | Admitting: Hematology and Oncology

## 2014-06-16 ENCOUNTER — Ambulatory Visit (HOSPITAL_COMMUNITY)
Admission: RE | Admit: 2014-06-16 | Discharge: 2014-06-16 | Disposition: A | Discharge status: Home / Self Care | Payer: Medicare Other | Source: Ambulatory Visit | Attending: Hematology and Oncology | Admitting: Hematology and Oncology

## 2014-06-16 ENCOUNTER — Other Ambulatory Visit (HOSPITAL_BASED_OUTPATIENT_CLINIC_OR_DEPARTMENT_OTHER): Payer: Medicare Other

## 2014-06-16 ENCOUNTER — Telehealth: Payer: Self-pay | Admitting: *Deleted

## 2014-06-16 ENCOUNTER — Non-Acute Institutional Stay (HOSPITAL_COMMUNITY)
Admission: AD | Admit: 2014-06-16 | Discharge: 2014-06-16 | Disposition: A | Discharge status: Home / Self Care | Payer: Medicare Other | Source: Ambulatory Visit | Attending: Hematology and Oncology | Admitting: Hematology and Oncology

## 2014-06-16 ENCOUNTER — Other Ambulatory Visit: Payer: Self-pay | Admitting: Hematology and Oncology

## 2014-06-16 DIAGNOSIS — D630 Anemia in neoplastic disease: Secondary | ICD-10-CM | POA: Diagnosis present

## 2014-06-16 DIAGNOSIS — D46C Myelodysplastic syndrome with isolated del(5q) chromosomal abnormality: Secondary | ICD-10-CM

## 2014-06-16 DIAGNOSIS — D649 Anemia, unspecified: Secondary | ICD-10-CM

## 2014-06-16 LAB — CBC & DIFF AND RETIC
BASO%: 0.6 % (ref 0.0–2.0)
BASOS ABS: 0.1 10*3/uL (ref 0.0–0.1)
EOS ABS: 0.2 10*3/uL (ref 0.0–0.5)
EOS%: 2.8 % (ref 0.0–7.0)
HCT: 22.1 % — ABNORMAL LOW (ref 34.8–46.6)
HEMOGLOBIN: 7.2 g/dL — AB (ref 11.6–15.9)
Immature Retic Fract: 10.8 % — ABNORMAL HIGH (ref 1.60–10.00)
LYMPH%: 32.8 % (ref 14.0–49.7)
MCH: 31.9 pg (ref 25.1–34.0)
MCHC: 32.6 g/dL (ref 31.5–36.0)
MCV: 97.8 fL (ref 79.5–101.0)
MONO#: 0.9 10*3/uL (ref 0.1–0.9)
MONO%: 11.3 % (ref 0.0–14.0)
NEUT%: 52.5 % (ref 38.4–76.8)
NEUTROS ABS: 4.2 10*3/uL (ref 1.5–6.5)
Platelets: 234 10*3/uL (ref 145–400)
RBC: 2.26 10*6/uL — ABNORMAL LOW (ref 3.70–5.45)
RDW: 18.1 % — ABNORMAL HIGH (ref 11.2–14.5)
Retic %: 1.64 % (ref 0.70–2.10)
Retic Ct Abs: 37.06 10*3/uL (ref 33.70–90.70)
WBC: 8.1 10*3/uL (ref 3.9–10.3)
lymph#: 2.7 10*3/uL (ref 0.9–3.3)
nRBC: 0 % (ref 0–0)

## 2014-06-16 LAB — TECHNOLOGIST REVIEW

## 2014-06-16 LAB — HOLD TUBE, BLOOD BANK

## 2014-06-16 MED ORDER — SODIUM CHLORIDE 0.9 % IJ SOLN: 3.0000 mL | INTRAMUSCULAR | Status: DC | PRN

## 2014-06-16 MED ORDER — HEPARIN SOD (PORK) LOCK FLUSH 100 UNIT/ML IV SOLN: 500.0000 [IU] | Freq: Every day | INTRAVENOUS | Status: DC | PRN

## 2014-06-16 MED ORDER — HEPARIN SOD (PORK) LOCK FLUSH 100 UNIT/ML IV SOLN: 250.0000 [IU] | INTRAVENOUS | Status: DC | PRN

## 2014-06-16 MED ORDER — FUROSEMIDE 10 MG/ML IJ SOLN
20.0000 mg | Freq: Once | INTRAMUSCULAR | Status: AC
  Administered 2014-06-16: 20 mg via INTRAVENOUS
  Filled 2014-06-16: qty 2

## 2014-06-16 MED ORDER — SODIUM CHLORIDE 0.9 % IJ SOLN: 10.0000 mL | INTRAMUSCULAR | Status: DC | PRN

## 2014-06-16 MED ORDER — SODIUM CHLORIDE 0.9 % IV SOLN
250.0000 mL | Freq: Once | INTRAVENOUS | Status: AC
  Administered 2014-06-16: 250 mL via INTRAVENOUS

## 2014-06-16 NOTE — Procedures (Signed)
[facility]  Procedure Note  Tonya Mckee LTR:[id number] DOB: 08-12-1929 DOA: 06/16/2014   PCP: [name]   Associated Diagnosis: Anemia in neoplastic disease  Procedure Note: Transfusion of 2 units PRBCs   Condition During Procedure: Pt tolerated well   Condition at Discharge:  Pt alert, oriented, ambulatory, walks with cane; no complications noted   Nigel Sloop, [facility]

## 2014-06-16 NOTE — Progress Notes (Signed)
Received letter from [employer].  Pt is approved for Revlimid from 06/11/14 to 06/11/15 or when the benefit cap has been met.  Expenses can be submitted for reimbursement from 03/13/14 to 06/11/15.  The amount of grant is $10,000. °

## 2014-06-16 NOTE — Telephone Encounter (Signed)
Spoke w/ pt and daughter in lobby.  Informed them of order for two units of blood today and need to have at [facility].  Map and directions given to get over to [facility].   Pt states she has not started the Revlmid yet.  She is scared.  Discussed w/ pt the sooner she starts treatment the sooner we will be able to know if it will help. Encouraged her to start today or tomorrow and daily as directed.  She verbalized understanding.  Daughter states can't make appt Monday 11/09 as scheduled as she will be out of town. She asks if pt can see Dr. Alvy Bimler this Thursday or next Friday.  Per Dr. Alvy Bimler she can see pt Monday 06/30/14 at 10:30 am.  Request sent to scheduler to r/s appt.Marland Kitchen

## 2014-06-16 NOTE — H&P (Signed)
The patient is here for transfusion only  

## 2014-06-17 ENCOUNTER — Telehealth: Payer: Self-pay | Admitting: *Deleted

## 2014-06-17 ENCOUNTER — Telehealth: Payer: Self-pay | Admitting: Hematology and Oncology

## 2014-06-17 LAB — TYPE AND SCREEN
ABO/RH(D): O POS
ANTIBODY SCREEN: NEGATIVE
UNIT DIVISION: 0
Unit division: 0

## 2014-06-17 NOTE — Telephone Encounter (Signed)
Daughter reports pt finally started taking Revlimid yesterday.  She wants to know what dose of prednisone to decrease?  Pt has been on 7.5 mg daily for several weeks and wants to taper off due to some bruising she thinks if from prednisone.  Is it ok to decrease to 5 mg daily?   Dau also asks for lab appt on Monday to be r/s to 11 am.  Request sent to scheduler

## 2014-06-17 NOTE — Telephone Encounter (Signed)
yes

## 2014-06-17 NOTE — Telephone Encounter (Signed)
Confirm appt d/t. She will call back if the d/t does not work.

## 2014-06-17 NOTE — Telephone Encounter (Signed)
Instructed daughter pt can decrease prednisone to 5 mg daily.  She verbalized understanding.

## 2014-06-18 ENCOUNTER — Telehealth: Payer: Self-pay | Admitting: Hematology and Oncology

## 2014-06-18 NOTE — Telephone Encounter (Signed)
s.w pt and advised on time change...pt ok adn aware

## 2014-06-23 ENCOUNTER — Ambulatory Visit: Payer: Medicare Other | Admitting: Hematology and Oncology

## 2014-06-23 ENCOUNTER — Other Ambulatory Visit: Payer: Medicare Other

## 2014-06-23 ENCOUNTER — Other Ambulatory Visit (HOSPITAL_BASED_OUTPATIENT_CLINIC_OR_DEPARTMENT_OTHER): Payer: Medicare Other

## 2014-06-23 DIAGNOSIS — D649 Anemia, unspecified: Secondary | ICD-10-CM

## 2014-06-23 DIAGNOSIS — D46C Myelodysplastic syndrome with isolated del(5q) chromosomal abnormality: Secondary | ICD-10-CM

## 2014-06-23 LAB — CBC & DIFF AND RETIC
BASO%: 0.7 % (ref 0.0–2.0)
BASOS ABS: 0 10*3/uL (ref 0.0–0.1)
EOS ABS: 0.2 10*3/uL (ref 0.0–0.5)
EOS%: 3.5 % (ref 0.0–7.0)
HEMATOCRIT: 30.3 % — AB (ref 34.8–46.6)
HEMOGLOBIN: 10.1 g/dL — AB (ref 11.6–15.9)
Immature Retic Fract: 2.5 % (ref 1.60–10.00)
LYMPH%: 43.6 % (ref 14.0–49.7)
MCH: 32 pg (ref 25.1–34.0)
MCHC: 33.3 g/dL (ref 31.5–36.0)
MCV: 95.9 fL (ref 79.5–101.0)
MONO#: 0.8 10*3/uL (ref 0.1–0.9)
MONO%: 14.5 % — ABNORMAL HIGH (ref 0.0–14.0)
NEUT%: 37.7 % — AB (ref 38.4–76.8)
NEUTROS ABS: 2 10*3/uL (ref 1.5–6.5)
PLATELETS: 204 10*3/uL (ref 145–400)
RBC: 3.16 10*6/uL — ABNORMAL LOW (ref 3.70–5.45)
RDW: 16.3 % — ABNORMAL HIGH (ref 11.2–14.5)
Retic %: 0.71 % (ref 0.70–2.10)
Retic Ct Abs: 22.44 10*3/uL — ABNORMAL LOW (ref 33.70–90.70)
WBC: 5.4 10*3/uL (ref 3.9–10.3)
lymph#: 2.4 10*3/uL (ref 0.9–3.3)
nRBC: 0 % (ref 0–0)

## 2014-06-23 LAB — HOLD TUBE, BLOOD BANK

## 2014-06-26 ENCOUNTER — Institutional Professional Consult (permissible substitution): Payer: Medicare Other | Admitting: Internal Medicine

## 2014-06-30 ENCOUNTER — Ambulatory Visit (HOSPITAL_BASED_OUTPATIENT_CLINIC_OR_DEPARTMENT_OTHER): Payer: Medicare Other | Admitting: Hematology and Oncology

## 2014-06-30 ENCOUNTER — Encounter: Payer: Self-pay | Admitting: Hematology and Oncology

## 2014-06-30 ENCOUNTER — Telehealth: Payer: Self-pay | Admitting: Hematology and Oncology

## 2014-06-30 ENCOUNTER — Other Ambulatory Visit (HOSPITAL_BASED_OUTPATIENT_CLINIC_OR_DEPARTMENT_OTHER): Payer: Medicare Other

## 2014-06-30 VITALS — BP 134/55 | HR 58 | Resp 18 | Ht 61.0 in | Wt 123.4 lb

## 2014-06-30 DIAGNOSIS — D630 Anemia in neoplastic disease: Secondary | ICD-10-CM

## 2014-06-30 DIAGNOSIS — D46C Myelodysplastic syndrome with isolated del(5q) chromosomal abnormality: Secondary | ICD-10-CM

## 2014-06-30 DIAGNOSIS — J449 Chronic obstructive pulmonary disease, unspecified: Secondary | ICD-10-CM

## 2014-06-30 DIAGNOSIS — D649 Anemia, unspecified: Secondary | ICD-10-CM

## 2014-06-30 DIAGNOSIS — Z720 Tobacco use: Secondary | ICD-10-CM

## 2014-06-30 DIAGNOSIS — J438 Other emphysema: Secondary | ICD-10-CM

## 2014-06-30 LAB — CBC & DIFF AND RETIC
BASO%: 1.4 % (ref 0.0–2.0)
BASOS ABS: 0.1 10*3/uL (ref 0.0–0.1)
EOS%: 4.3 % (ref 0.0–7.0)
Eosinophils Absolute: 0.3 10*3/uL (ref 0.0–0.5)
HEMATOCRIT: 28.3 % — AB (ref 34.8–46.6)
HEMOGLOBIN: 9.3 g/dL — AB (ref 11.6–15.9)
IMMATURE RETIC FRACT: 6.5 % (ref 1.60–10.00)
LYMPH#: 3.1 10*3/uL (ref 0.9–3.3)
LYMPH%: 46.9 % (ref 14.0–49.7)
MCH: 31.4 pg (ref 25.1–34.0)
MCHC: 32.9 g/dL (ref 31.5–36.0)
MCV: 95.6 fL (ref 79.5–101.0)
MONO#: 0.9 10*3/uL (ref 0.1–0.9)
MONO%: 14.5 % — ABNORMAL HIGH (ref 0.0–14.0)
NEUT#: 2.1 10*3/uL (ref 1.5–6.5)
NEUT%: 32.9 % — AB (ref 38.4–76.8)
Platelets: 205 10*3/uL (ref 145–400)
RBC: 2.96 10*6/uL — ABNORMAL LOW (ref 3.70–5.45)
RDW: 16.3 % — AB (ref 11.2–14.5)
RETIC %: 0.64 % — AB (ref 0.70–2.10)
Retic Ct Abs: 18.94 10*3/uL — ABNORMAL LOW (ref 33.70–90.70)
WBC: 6.5 10*3/uL (ref 3.9–10.3)
nRBC: 0 % (ref 0–0)

## 2014-06-30 LAB — TECHNOLOGIST REVIEW

## 2014-06-30 LAB — HOLD TUBE, BLOOD BANK

## 2014-06-30 NOTE — Telephone Encounter (Signed)
Gave avs & cal for Dec. °

## 2014-06-30 NOTE — Assessment & Plan Note (Signed)
She is not symptomatic right now. I will give her blood transfusion if hemoglobin dropped to less than 8 g. Continue weekly blood work monitoring. 

## 2014-06-30 NOTE — Progress Notes (Signed)
[facility] OFFICE PROGRESS NOTE  Patient Care Team: Carlos Levering, PA-C as PCP - General (Family Medicine) Lennon Alstrom, MD (Neurology) Carlos Levering, PA-C as Referring Physician (Family Medicine)  SUMMARY OF ONCOLOGIC HISTORY:  She was found to have abnormal CBC from routine blood work. Starting around 2012, she has progressive anemia with hemoglobin and the lowest at 9.1.  She denies recent chest pain on exertion but complains of significant shortness of breath on minimal exertion. She has had pre-syncopal episodes and palpitations. She has profound fatigue and takes frequent naps. Recently, she had a fall.  Of note, the patient has 60 pack plus year history and recently was placed on oxygen therapy.  She never has EGD or screening colonoscopy.  She has been taking vitamin B12 supplements for a long time.  She has remote history of left breast cancer detected from mammogram. According to the patient, she had lumpectomy followed by radiation followed by tamoxifen. She does not know the stage of disease but she does not think the lymph nodes were involved.  The patient was prescribed oral iron supplements and she takes them regularly.  On 12/27/13: Bone marrow biopsy did not show evidence of leukemia or metastatic cancer. Cytogenetics and FISH analysis confirm myelodysplastic syndrome with deletion 5Q. The patient was started on erythropoietin stimulating agent to keep hemoglobin greater than 10 g. On 04/15/14: CT head was negative On 04/24/2014, we discontinued darbepoetin as the patient complained of side effects. She was started on prednisone 10 mg daily. On 05/22/14: Prednisone is tapered to 5 mg. On 06/16/2014, she started on Revlimid 2.5 mg daily.  INTERVAL HISTORY: Please see below for problem oriented charting. She complains of persistent fatigue. She has successfully quit smoking for 2 weeks. The patient denies any recent signs or symptoms of bleeding such as  spontaneous epistaxis, hematuria or hematochezia.  REVIEW OF SYSTEMS:   Constitutional: Denies fevers, chills or abnormal weight loss Eyes: Denies blurriness of vision Ears, nose, mouth, throat, and face: Denies mucositis or sore throat Respiratory: Denies cough, dyspnea or wheezes Cardiovascular: Denies palpitation, chest discomfort or lower extremity swelling Gastrointestinal:  Denies nausea, heartburn or change in bowel habits Skin: Denies abnormal skin rashes Lymphatics: Denies new lymphadenopathy or easy bruising Neurological:Denies numbness, tingling or new weaknesses Behavioral/Psych: Mood is stable, no new changes  All other systems were reviewed with the patient and are negative.  I have reviewed the past medical history, past surgical history, social history and family history with the patient and they are unchanged from previous note.  ALLERGIES:  is allergic to donepezil; penicillins; amoxapine and related; doxycycline; keflex; bupropion; ketek; levaquin; sulfa antibiotics; tramadol; and zyrtec.  MEDICATIONS:  Current Outpatient Prescriptions  Medication Sig Dispense Refill  . acetaminophen (TYLENOL) 325 MG tablet Take 650 mg by mouth every 6 (six) hours as needed (pain).     Marland Kitchen albuterol (PROVENTIL HFA;VENTOLIN HFA) 108 (90 BASE) MCG/ACT inhaler Inhale 1-2 puffs into the lungs every 6 (six) hours as needed for wheezing or shortness of breath (wheezing).    . calcium carbonate (TUMS - DOSED IN MG ELEMENTAL CALCIUM) 500 MG chewable tablet Chew 1 tablet by mouth daily as needed for indigestion or heartburn.    Marland Kitchen FLUoxetine (PROZAC) 10 MG capsule Take 30 mg by mouth every morning.     . fluticasone (FLONASE) 50 MCG/ACT nasal spray Place 2 sprays into the nose daily as needed (allergies).     Marland Kitchen ibuprofen (ADVIL,MOTRIN) 200 MG tablet Take 400  mg by mouth every 6 (six) hours as needed for moderate pain.    Marland Kitchen ipratropium-albuterol (DUONEB) 0.5-2.5 (3) MG/3ML SOLN Take 3 mLs by  nebulization 3 (three) times daily.    Marland Kitchen lenalidomide (REVLIMID) 2.5 MG capsule Take 1 capsule (2.5 mg total) by mouth daily. 30 capsule 0  . lisinopril (PRINIVIL,ZESTRIL) 10 MG tablet Take 20 mg by mouth daily.     . metoprolol succinate (TOPROL-XL) 50 MG 24 hr tablet Take 50 mg by mouth every evening. Take with or immediately following a meal.    . predniSONE (DELTASONE) 5 MG tablet Take 1.5 tablets (7.5 mg total) by mouth daily with breakfast. 45 tablet 0  . Cholecalciferol (VITAMIN D PO) Take by mouth.    . Cyanocobalamin (VITAMIN B-12 CR PO) Take by mouth.    Marland Kitchen LORazepam (ATIVAN) 0.5 MG tablet Take 0.5 mg by mouth every 8 (eight) hours as needed (anxiety).      No current facility-administered medications for this visit.    PHYSICAL EXAMINATION: ECOG PERFORMANCE STATUS: 1 - Symptomatic but completely ambulatory  Filed Vitals:   06/30/14 1045  BP: 134/55  Pulse: 58  Resp: 18   Filed Weights   06/30/14 1045  Weight: 123 lb 6.4 oz (55.974 kg)    GENERAL:alert, no distress and comfortable SKIN: skin color, texture, turgor are normal, no rashes or significant lesions EYES: normal, Conjunctiva are pink and non-injected, sclera clear OROPHARYNX:no exudate, no erythema and lips, buccal mucosa, and tongue normal  NECK: supple, thyroid normal size, non-tender, without nodularity LYMPH:  no palpable lymphadenopathy in the cervical, axillary or inguinal LUNGS: clear to auscultation and percussion with normal breathing effort HEART: regular rate & rhythm and no murmurs and no lower extremity edema ABDOMEN:abdomen soft, non-tender and normal bowel sounds Musculoskeletal:no cyanosis of digits and no clubbing  NEURO: alert & oriented x 3 with fluent speech, no focal motor/sensory deficits  LABORATORY DATA:  I have reviewed the data as listed    Component Value Date/Time   NA 139 04/15/2014 1901   NA 141 12/19/2013 0938   K 4.1 04/15/2014 1901   K 4.7 12/19/2013 0938   CL 102  04/15/2014 1901   CO2 28 04/15/2014 1901   CO2 27 12/19/2013 0938   GLUCOSE 129* 04/15/2014 1901   GLUCOSE 96 12/19/2013 0938   BUN 16 04/15/2014 1901   BUN 12.0 12/19/2013 0938   CREATININE 0.65 04/15/2014 1901   CREATININE 0.8 12/19/2013 0938   CALCIUM 8.5 04/15/2014 1901   CALCIUM 9.1 12/19/2013 0938   PROT 6.4 04/15/2014 1901   PROT 6.3* 12/19/2013 0938   ALBUMIN 3.8 04/15/2014 1901   ALBUMIN 3.5 12/19/2013 0938   AST 19 04/15/2014 1901   AST 12 12/19/2013 0938   ALT 9 04/15/2014 1901   ALT <6 12/19/2013 0938   ALKPHOS 63 04/15/2014 1901   ALKPHOS 67 12/19/2013 0938   BILITOT 0.6 04/15/2014 1901   BILITOT 0.64 12/19/2013 0938   GFRNONAA 80* 04/15/2014 1901   GFRAA >90 04/15/2014 1901    No results found for: SPEP, UPEP  Lab Results  Component Value Date   WBC 6.5 06/30/2014   NEUTROABS 2.1 06/30/2014   HGB 9.3* 06/30/2014   HCT 28.3* 06/30/2014   MCV 95.6 06/30/2014   PLT 205 06/30/2014      Chemistry      Component Value Date/Time   NA 139 04/15/2014 1901   NA 141 12/19/2013 0938   K 4.1 04/15/2014 1901   K  4.7 12/19/2013 0938   CL 102 04/15/2014 1901   CO2 28 04/15/2014 1901   CO2 27 12/19/2013 0938   BUN 16 04/15/2014 1901   BUN 12.0 12/19/2013 0938   CREATININE 0.65 04/15/2014 1901   CREATININE 0.8 12/19/2013 0938      Component Value Date/Time   CALCIUM 8.5 04/15/2014 1901   CALCIUM 9.1 12/19/2013 0938   ALKPHOS 63 04/15/2014 1901   ALKPHOS 67 12/19/2013 0938   AST 19 04/15/2014 1901   AST 12 12/19/2013 0938   ALT 9 04/15/2014 1901   ALT <6 12/19/2013 0938   BILITOT 0.6 04/15/2014 1901   BILITOT 0.64 12/19/2013 0938     ASSESSMENT & PLAN:  MDS (myelodysplastic syndrome) with 5q deletion The patient does not want Aranesp injection. We discussed the risks, benefit, side effects of Revlimid and she agreed to proceed. She has taken treatment for 2 weeks with no side effects. Plan to Continue prednisone at 5 mg daily and recheck her blood  test weekly until I see her back.  Anemia in neoplastic disease She is not symptomatic right now. I will give her blood transfusion if hemoglobin dropped to less than 8 g. Continue weekly blood work monitoring.  COPD (chronic obstructive pulmonary disease) She will remain on 5 mg of prednisone daily.  Tobacco abuse I congratulated her efforts of smoking cessation.   No orders of the defined types were placed in this encounter.   All questions were answered. The patient knows to call the clinic with any problems, questions or concerns. No barriers to learning was detected. I spent 25 minutes counseling the patient face to face. The total time spent in the appointment was 30 minutes and more than 50% was on counseling and review of test results     [facility], [place], MD 06/30/2014 12:36 PM

## 2014-06-30 NOTE — Assessment & Plan Note (Signed)
I congratulated her efforts of smoking cessation.

## 2014-06-30 NOTE — Assessment & Plan Note (Addendum)
The patient does not want Aranesp injection. We discussed the risks, benefit, side effects of Revlimid and she agreed to proceed. She has taken treatment for 2 weeks with no side effects. Plan to Continue prednisone at 5 mg daily and recheck her blood test weekly until I see her back.

## 2014-06-30 NOTE — Assessment & Plan Note (Signed)
She will remain on 5 mg of prednisone daily.

## 2014-07-01 ENCOUNTER — Other Ambulatory Visit: Payer: Self-pay | Admitting: *Deleted

## 2014-07-01 DIAGNOSIS — D46C Myelodysplastic syndrome with isolated del(5q) chromosomal abnormality: Secondary | ICD-10-CM

## 2014-07-01 NOTE — Telephone Encounter (Signed)
THIS REFILL REQUEST FOR REVLIMID WAS PLACED ON DR.GORSUCH'S DESK. 

## 2014-07-02 MED ORDER — LENALIDOMIDE 2.5 MG PO CAPS
2.5000 mg | ORAL_CAPSULE | Freq: Every day | ORAL | Status: DC
Start: 2014-07-02 — End: 2014-07-31

## 2014-07-02 NOTE — Addendum Note (Signed)
Addended by: Wyonia Hough on: 07/02/2014 12:10 PM   Modules accepted: Orders

## 2014-07-07 ENCOUNTER — Telehealth: Payer: Self-pay | Admitting: Hematology and Oncology

## 2014-07-07 NOTE — Telephone Encounter (Signed)
s.w. pt and advised on time change per MD....pt ok adn aware

## 2014-07-08 ENCOUNTER — Ambulatory Visit (HOSPITAL_BASED_OUTPATIENT_CLINIC_OR_DEPARTMENT_OTHER): Payer: Medicare Other

## 2014-07-08 ENCOUNTER — Telehealth: Payer: Self-pay | Admitting: *Deleted

## 2014-07-08 ENCOUNTER — Other Ambulatory Visit: Payer: Self-pay | Admitting: Hematology and Oncology

## 2014-07-08 ENCOUNTER — Other Ambulatory Visit: Payer: Self-pay | Admitting: *Deleted

## 2014-07-08 ENCOUNTER — Other Ambulatory Visit (HOSPITAL_BASED_OUTPATIENT_CLINIC_OR_DEPARTMENT_OTHER): Payer: Medicare Other

## 2014-07-08 VITALS — BP 136/80 | HR 66 | Temp 97.8°F | Resp 18

## 2014-07-08 DIAGNOSIS — D630 Anemia in neoplastic disease: Secondary | ICD-10-CM

## 2014-07-08 DIAGNOSIS — R42 Dizziness and giddiness: Secondary | ICD-10-CM

## 2014-07-08 DIAGNOSIS — D46C Myelodysplastic syndrome with isolated del(5q) chromosomal abnormality: Secondary | ICD-10-CM | POA: Diagnosis not present

## 2014-07-08 DIAGNOSIS — D460 Refractory anemia without ring sideroblasts, so stated: Secondary | ICD-10-CM

## 2014-07-08 DIAGNOSIS — R404 Transient alteration of awareness: Secondary | ICD-10-CM

## 2014-07-08 DIAGNOSIS — D649 Anemia, unspecified: Secondary | ICD-10-CM

## 2014-07-08 LAB — CBC & DIFF AND RETIC
BASO%: 0.9 % (ref 0.0–2.0)
Basophils Absolute: 0.1 10*3/uL (ref 0.0–0.1)
EOS%: 8.8 % — AB (ref 0.0–7.0)
Eosinophils Absolute: 0.7 10*3/uL — ABNORMAL HIGH (ref 0.0–0.5)
HCT: 23.8 % — ABNORMAL LOW (ref 34.8–46.6)
HGB: 7.8 g/dL — ABNORMAL LOW (ref 11.6–15.9)
IMMATURE RETIC FRACT: 12.1 % — AB (ref 1.60–10.00)
LYMPH%: 21.2 % (ref 14.0–49.7)
MCH: 31.5 pg (ref 25.1–34.0)
MCHC: 32.8 g/dL (ref 31.5–36.0)
MCV: 96 fL (ref 79.5–101.0)
MONO#: 1.7 10*3/uL — AB (ref 0.1–0.9)
MONO%: 23 % — AB (ref 0.0–14.0)
NEUT#: 3.5 10*3/uL (ref 1.5–6.5)
NEUT%: 46.1 % (ref 38.4–76.8)
PLATELETS: 158 10*3/uL (ref 145–400)
RBC: 2.48 10*6/uL — AB (ref 3.70–5.45)
RDW: 17.3 % — ABNORMAL HIGH (ref 11.2–14.5)
Retic %: 1.65 % (ref 0.70–2.10)
Retic Ct Abs: 40.92 10*3/uL (ref 33.70–90.70)
WBC: 7.5 10*3/uL (ref 3.9–10.3)
lymph#: 1.6 10*3/uL (ref 0.9–3.3)
nRBC: 0 % (ref 0–0)

## 2014-07-08 LAB — TECHNOLOGIST REVIEW

## 2014-07-08 LAB — PREPARE RBC (CROSSMATCH)

## 2014-07-08 LAB — HOLD TUBE, BLOOD BANK

## 2014-07-08 MED ORDER — PREDNISONE 5 MG PO TABS: 5.0000 mg | ORAL_TABLET | Freq: Every day | ORAL | Status: DC

## 2014-07-08 MED ORDER — SODIUM CHLORIDE 0.9 % IV SOLN
250.0000 mL | Freq: Once | INTRAVENOUS | Status: AC
  Administered 2014-07-08: 250 mL via INTRAVENOUS

## 2014-07-08 NOTE — Telephone Encounter (Signed)
S/w pt in lobby. She feels "very tired."  Hgb is 7.8 and Dr. Alvy Bimler ordered one unit of Blood.  Pt states needs refill on her prednisone and revlimid.  Informed her Revlimid refill was sent and for her to call [facility] to arrange delivery.  We will refill the Prednisone.  Pt also c/o congestion, feeling aches and chills.  Denies fever.  Says she thinks she has a cold.  Instructed pt to call if she has a fever or any worsening symptoms.  She verbalized understanding.

## 2014-07-08 NOTE — Patient Instructions (Signed)

## 2014-07-08 NOTE — Telephone Encounter (Signed)
Please refill 30 days 1 refill

## 2014-07-09 LAB — TYPE AND SCREEN
ABO/RH(D): O POS
ANTIBODY SCREEN: NEGATIVE
Unit division: 0

## 2014-07-09 NOTE — Telephone Encounter (Signed)
RECEIVED A FAX FROM [facility] CONCERNING A CONFIRMATION OF PRESCRIPTION SHIPMENT FOR REVLIMID ON 07/08/14. 

## 2014-07-14 ENCOUNTER — Ambulatory Visit (HOSPITAL_BASED_OUTPATIENT_CLINIC_OR_DEPARTMENT_OTHER): Payer: Medicare Other | Admitting: Hematology and Oncology

## 2014-07-14 ENCOUNTER — Encounter: Payer: Self-pay | Admitting: Hematology and Oncology

## 2014-07-14 ENCOUNTER — Telehealth: Payer: Self-pay | Admitting: Hematology and Oncology

## 2014-07-14 ENCOUNTER — Institutional Professional Consult (permissible substitution): Payer: Medicare Other | Admitting: Internal Medicine

## 2014-07-14 ENCOUNTER — Ambulatory Visit: Payer: Medicare Other | Admitting: Hematology and Oncology

## 2014-07-14 ENCOUNTER — Other Ambulatory Visit (HOSPITAL_BASED_OUTPATIENT_CLINIC_OR_DEPARTMENT_OTHER): Payer: Medicare Other

## 2014-07-14 ENCOUNTER — Other Ambulatory Visit: Payer: Medicare Other

## 2014-07-14 VITALS — BP 124/55 | HR 53 | Temp 97.8°F | Resp 18 | Ht 61.0 in | Wt 122.3 lb

## 2014-07-14 DIAGNOSIS — D630 Anemia in neoplastic disease: Secondary | ICD-10-CM

## 2014-07-14 DIAGNOSIS — D649 Anemia, unspecified: Secondary | ICD-10-CM

## 2014-07-14 DIAGNOSIS — D46C Myelodysplastic syndrome with isolated del(5q) chromosomal abnormality: Secondary | ICD-10-CM

## 2014-07-14 DIAGNOSIS — I952 Hypotension due to drugs: Secondary | ICD-10-CM

## 2014-07-14 DIAGNOSIS — I959 Hypotension, unspecified: Secondary | ICD-10-CM | POA: Insufficient documentation

## 2014-07-14 DIAGNOSIS — R51 Headache: Secondary | ICD-10-CM

## 2014-07-14 DIAGNOSIS — R519 Headache, unspecified: Secondary | ICD-10-CM

## 2014-07-14 LAB — CBC & DIFF AND RETIC
BASO%: 2.1 % — ABNORMAL HIGH (ref 0.0–2.0)
BASOS ABS: 0.2 10*3/uL — AB (ref 0.0–0.1)
EOS ABS: 1.2 10*3/uL — AB (ref 0.0–0.5)
EOS%: 16.2 % — ABNORMAL HIGH (ref 0.0–7.0)
HCT: 26.5 % — ABNORMAL LOW (ref 34.8–46.6)
HEMOGLOBIN: 8.8 g/dL — AB (ref 11.6–15.9)
Immature Retic Fract: 7.2 % (ref 1.60–10.00)
LYMPH%: 22.3 % (ref 14.0–49.7)
MCH: 31.3 pg (ref 25.1–34.0)
MCHC: 33.2 g/dL (ref 31.5–36.0)
MCV: 94.3 fL (ref 79.5–101.0)
MONO#: 1.4 10*3/uL — ABNORMAL HIGH (ref 0.1–0.9)
MONO%: 19.9 % — AB (ref 0.0–14.0)
NEUT%: 39.5 % (ref 38.4–76.8)
NEUTROS ABS: 2.8 10*3/uL (ref 1.5–6.5)
PLATELETS: 221 10*3/uL (ref 145–400)
RBC: 2.81 10*6/uL — ABNORMAL LOW (ref 3.70–5.45)
RDW: 18.1 % — ABNORMAL HIGH (ref 11.2–14.5)
RETIC %: 1.23 % (ref 0.70–2.10)
RETIC CT ABS: 34.56 10*3/uL (ref 33.70–90.70)
WBC: 7.2 10*3/uL (ref 3.9–10.3)
lymph#: 1.6 10*3/uL (ref 0.9–3.3)
nRBC: 0 % (ref 0–0)

## 2014-07-14 LAB — HOLD TUBE, BLOOD BANK

## 2014-07-14 MED ORDER — BUTALBITAL-APAP-CAFFEINE 50-325-40 MG PO TABS: 1.0000 | ORAL_TABLET | Freq: Four times a day (QID) | ORAL | Status: DC | PRN

## 2014-07-14 NOTE — Assessment & Plan Note (Signed)
Headaches is worse since she initiated prednisone taper. Suspect she may have poor oxygenation of her lungs once we initiated prednisone taper. I recommend consideration for round the clock oxygen therapy. If her headaches is not better, I may have to order imaging study to exclude other causes of headache. I refilled her prescription Fioricet. It appears that she may have an element of caffeine withdrawal as well. I also told her to discontinue her blood pressure medication as low blood pressure can also cause headaches.

## 2014-07-14 NOTE — Telephone Encounter (Signed)
gv adn printed appt sched and avs for pt for DEc °

## 2014-07-14 NOTE — Assessment & Plan Note (Signed)
The patient does not want Aranesp injection. We discussed the risks, benefit, side effects of Revlimid and she agreed to proceed. She has taken treatment for 4 weeks with no side effects. Plan to Continue prednisone at 5 mg daily and recheck her blood test weekly until I see her back. It appears that had need for transfusion has reduced. I plan to increase Revlimid to 5 mg in the future if she tolerates treatment well.

## 2014-07-14 NOTE — Assessment & Plan Note (Signed)
She is not symptomatic right now. I will give her blood transfusion if hemoglobin dropped to less than 8 g. Continue weekly blood work monitoring. 

## 2014-07-14 NOTE — Progress Notes (Signed)
[facility] OFFICE PROGRESS NOTE  Patient Care Team: Carlos Levering, PA-C as PCP - General (Family Medicine) Lennon Alstrom, MD (Neurology) Carlos Levering, PA-C as Referring Physician (Family Medicine)  SUMMARY OF ONCOLOGIC HISTORY:  She was found to have abnormal CBC from routine blood work. Starting around 2012, she has progressive anemia with hemoglobin and the lowest at 9.1.  She denies recent chest pain on exertion but complains of significant shortness of breath on minimal exertion. She has had pre-syncopal episodes and palpitations. She has profound fatigue and takes frequent naps. Recently, she had a fall.  Of note, the patient has 60 pack plus year history and recently was placed on oxygen therapy.  She never has EGD or screening colonoscopy.  She has been taking vitamin B12 supplements for a long time.  She has remote history of left breast cancer detected from mammogram. According to the patient, she had lumpectomy followed by radiation followed by tamoxifen. She does not know the stage of disease but she does not think the lymph nodes were involved.  The patient was prescribed oral iron supplements and she takes them regularly.  On 12/27/13: Bone marrow biopsy did not show evidence of leukemia or metastatic cancer. Cytogenetics and FISH analysis confirm myelodysplastic syndrome with deletion 5Q. The patient was started on erythropoietin stimulating agent to keep hemoglobin greater than 10 g. On 04/15/14: CT head was negative On 04/24/2014, we discontinued darbepoetin as the patient complained of side effects. She was started on prednisone 10 mg daily. On 05/22/14: Prednisone is tapered to 5 mg. On 06/16/2014, she started on Revlimid 2.5 mg daily.  INTERVAL HISTORY: Please see below for problem oriented charting. She complained of daily headaches. Denies any chest pain on was then in shortness of breath. She felt that she may have a cold recently with some myalgias  and arthralgias.  REVIEW OF SYSTEMS:   Constitutional: Denies fevers, chills or abnormal weight loss Eyes: Denies blurriness of vision Ears, nose, mouth, throat, and face: Denies mucositis or sore throat Respiratory: Denies cough, dyspnea or wheezes Cardiovascular: Denies palpitation, chest discomfort or lower extremity swelling Gastrointestinal:  Denies nausea, heartburn or change in bowel habits Skin: Denies abnormal skin rashes Lymphatics: Denies new lymphadenopathy or easy bruising Neurological:Denies numbness, tingling or new weaknesses Behavioral/Psych: Mood is stable, no new changes  All other systems were reviewed with the patient and are negative.  I have reviewed the past medical history, past surgical history, social history and family history with the patient and they are unchanged from previous note.  ALLERGIES:  is allergic to donepezil; penicillins; amoxapine and related; doxycycline; keflex; bupropion; ketek; levaquin; sulfa antibiotics; tramadol; and zyrtec.  MEDICATIONS:  Current Outpatient Prescriptions  Medication Sig Dispense Refill  . acetaminophen (TYLENOL) 325 MG tablet Take 650 mg by mouth every 6 (six) hours as needed (pain).     Marland Kitchen albuterol (PROVENTIL HFA;VENTOLIN HFA) 108 (90 BASE) MCG/ACT inhaler Inhale 1-2 puffs into the lungs every 6 (six) hours as needed for wheezing or shortness of breath (wheezing).    . calcium carbonate (TUMS - DOSED IN MG ELEMENTAL CALCIUM) 500 MG chewable tablet Chew 1 tablet by mouth daily as needed for indigestion or heartburn.    . Cholecalciferol (VITAMIN D PO) Take by mouth.    . Cyanocobalamin (VITAMIN B-12 CR PO) Take by mouth.    Marland Kitchen FLUoxetine (PROZAC) 10 MG capsule Take 30 mg by mouth every morning.     . fluticasone (FLONASE) 50 MCG/ACT nasal  spray Place 2 sprays into the nose daily as needed (allergies).     Marland Kitchen ibuprofen (ADVIL,MOTRIN) 200 MG tablet Take 400 mg by mouth every 6 (six) hours as needed for moderate pain.    Marland Kitchen  ipratropium-albuterol (DUONEB) 0.5-2.5 (3) MG/3ML SOLN Take 3 mLs by nebulization 3 (three) times daily.    Marland Kitchen lenalidomide (REVLIMID) 2.5 MG capsule Take 1 capsule (2.5 mg total) by mouth daily. 28 capsule 0  . lisinopril (PRINIVIL,ZESTRIL) 10 MG tablet Take 20 mg by mouth daily.     Marland Kitchen LORazepam (ATIVAN) 0.5 MG tablet Take 0.5 mg by mouth every 8 (eight) hours as needed (anxiety).     . metoprolol succinate (TOPROL-XL) 50 MG 24 hr tablet Take 50 mg by mouth every evening. Take with or immediately following a meal.    . predniSONE (DELTASONE) 5 MG tablet Take 1 tablet (5 mg total) by mouth daily with breakfast. 30 tablet 1  . butalbital-acetaminophen-caffeine (FIORICET) 50-325-40 MG per tablet Take 1 tablet by mouth every 6 (six) hours as needed for headache. 60 tablet 0   No current facility-administered medications for this visit.    PHYSICAL EXAMINATION: ECOG PERFORMANCE STATUS: 2 - Symptomatic, <50% confined to bed  Filed Vitals:   07/14/14 0953  BP: 124/55  Pulse: 53  Temp: 97.8 F (36.6 C)  Resp: 18   Filed Weights   07/14/14 0953  Weight: 122 lb 4.8 oz (55.475 kg)    GENERAL:alert, no distress. She looks comfortable SKIN: skin color is pale, texture, turgor are normal, no rashes or significant lesions EYES: normal, Conjunctiva are pale and non-injected, sclera clear OROPHARYNX:no exudate, no erythema and lips, buccal mucosa, and tongue normal  NECK: supple, thyroid normal size, non-tender, without nodularity LYMPH:  no palpable lymphadenopathy in the cervical, axillary or inguinal LUNGS: clear to auscultation and percussion with normal breathing effort HEART: regular rate & rhythm and no murmurs and no lower extremity edema ABDOMEN:abdomen soft, non-tender and normal bowel sounds Musculoskeletal:no cyanosis of digits and no clubbing  NEURO: alert & oriented x 3 with fluent speech, no focal motor/sensory deficits  LABORATORY DATA:  I have reviewed the data as listed     Component Value Date/Time   NA 139 04/15/2014 1901   NA 141 12/19/2013 0938   K 4.1 04/15/2014 1901   K 4.7 12/19/2013 0938   CL 102 04/15/2014 1901   CO2 28 04/15/2014 1901   CO2 27 12/19/2013 0938   GLUCOSE 129* 04/15/2014 1901   GLUCOSE 96 12/19/2013 0938   BUN 16 04/15/2014 1901   BUN 12.0 12/19/2013 0938   CREATININE 0.65 04/15/2014 1901   CREATININE 0.8 12/19/2013 0938   CALCIUM 8.5 04/15/2014 1901   CALCIUM 9.1 12/19/2013 0938   PROT 6.4 04/15/2014 1901   PROT 6.3* 12/19/2013 0938   ALBUMIN 3.8 04/15/2014 1901   ALBUMIN 3.5 12/19/2013 0938   AST 19 04/15/2014 1901   AST 12 12/19/2013 0938   ALT 9 04/15/2014 1901   ALT <6 12/19/2013 0938   ALKPHOS 63 04/15/2014 1901   ALKPHOS 67 12/19/2013 0938   BILITOT 0.6 04/15/2014 1901   BILITOT 0.64 12/19/2013 0938   GFRNONAA 80* 04/15/2014 1901   GFRAA >90 04/15/2014 1901    No results found for: SPEP, UPEP  Lab Results  Component Value Date   WBC 7.2 07/14/2014   NEUTROABS 2.8 07/14/2014   HGB 8.8* 07/14/2014   HCT 26.5* 07/14/2014   MCV 94.3 07/14/2014   PLT 221 07/14/2014  Chemistry      Component Value Date/Time   NA 139 04/15/2014 1901   NA 141 12/19/2013 0938   K 4.1 04/15/2014 1901   K 4.7 12/19/2013 0938   CL 102 04/15/2014 1901   CO2 28 04/15/2014 1901   CO2 27 12/19/2013 0938   BUN 16 04/15/2014 1901   BUN 12.0 12/19/2013 0938   CREATININE 0.65 04/15/2014 1901   CREATININE 0.8 12/19/2013 0938      Component Value Date/Time   CALCIUM 8.5 04/15/2014 1901   CALCIUM 9.1 12/19/2013 0938   ALKPHOS 63 04/15/2014 1901   ALKPHOS 67 12/19/2013 0938   AST 19 04/15/2014 1901   AST 12 12/19/2013 0938   ALT 9 04/15/2014 1901   ALT <6 12/19/2013 0938   BILITOT 0.6 04/15/2014 1901   BILITOT 0.64 12/19/2013 0938      ASSESSMENT & PLAN:  MDS (myelodysplastic syndrome) with 5q deletion The patient does not want Aranesp injection. We discussed the risks, benefit, side effects of Revlimid and she  agreed to proceed. She has taken treatment for 4 weeks with no side effects. Plan to Continue prednisone at 5 mg daily and recheck her blood test weekly until I see her back. It appears that had need for transfusion has reduced. I plan to increase Revlimid to 5 mg in the future if she tolerates treatment well.    Anemia in neoplastic disease She is not symptomatic right now. I will give her blood transfusion if hemoglobin dropped to less than 8 g. Continue weekly blood work monitoring.  Persistent headaches Headaches is worse since she initiated prednisone taper. Suspect she may have poor oxygenation of her lungs once we initiated prednisone taper. I recommend consideration for round the clock oxygen therapy. If her headaches is not better, I may have to order imaging study to exclude other causes of headache. I refilled her prescription Fioricet. It appears that she may have an element of caffeine withdrawal as well. I also told her to discontinue her blood pressure medication as low blood pressure can also cause headaches.   Hypotension I hold her to hold off taking lisinopril as her blood pressure is borderline low right now.   No orders of the defined types were placed in this encounter.   All questions were answered. The patient knows to call the clinic with any problems, questions or concerns. No barriers to learning was detected. I spent 25 minutes counseling the patient face to face. The total time spent in the appointment was 30 minutes and more than 50% was on counseling and review of test results     [facility] [place], [place], MD 07/14/2014 9:04 PM

## 2014-07-14 NOTE — Assessment & Plan Note (Signed)
I hold her to hold off taking lisinopril as her blood pressure is borderline low right now.

## 2014-07-16 ENCOUNTER — Telehealth: Payer: Self-pay | Admitting: *Deleted

## 2014-07-16 NOTE — Telephone Encounter (Signed)
Opened note in error.

## 2014-07-21 ENCOUNTER — Telehealth: Payer: Self-pay | Admitting: *Deleted

## 2014-07-21 ENCOUNTER — Ambulatory Visit (HOSPITAL_COMMUNITY)
Admission: RE | Admit: 2014-07-21 | Discharge: 2014-07-21 | Disposition: A | Discharge status: Home / Self Care | Payer: Medicare Other | Source: Ambulatory Visit | Attending: Hematology and Oncology | Admitting: Hematology and Oncology

## 2014-07-21 ENCOUNTER — Other Ambulatory Visit: Payer: Self-pay | Admitting: *Deleted

## 2014-07-21 ENCOUNTER — Other Ambulatory Visit (HOSPITAL_BASED_OUTPATIENT_CLINIC_OR_DEPARTMENT_OTHER): Payer: Medicare Other

## 2014-07-21 ENCOUNTER — Ambulatory Visit (HOSPITAL_BASED_OUTPATIENT_CLINIC_OR_DEPARTMENT_OTHER): Payer: Medicare Other

## 2014-07-21 VITALS — BP 156/62 | HR 60 | Temp 98.4°F | Resp 18

## 2014-07-21 DIAGNOSIS — D46C Myelodysplastic syndrome with isolated del(5q) chromosomal abnormality: Secondary | ICD-10-CM | POA: Diagnosis not present

## 2014-07-21 DIAGNOSIS — D630 Anemia in neoplastic disease: Secondary | ICD-10-CM | POA: Diagnosis not present

## 2014-07-21 DIAGNOSIS — D469 Myelodysplastic syndrome, unspecified: Secondary | ICD-10-CM

## 2014-07-21 DIAGNOSIS — D649 Anemia, unspecified: Secondary | ICD-10-CM

## 2014-07-21 LAB — CBC & DIFF AND RETIC
BASO%: 2.6 % — ABNORMAL HIGH (ref 0.0–2.0)
Basophils Absolute: 0.2 10*3/uL — ABNORMAL HIGH (ref 0.0–0.1)
EOS ABS: 0.6 10*3/uL — AB (ref 0.0–0.5)
EOS%: 9.8 % — ABNORMAL HIGH (ref 0.0–7.0)
HEMATOCRIT: 24.2 % — AB (ref 34.8–46.6)
HGB: 7.9 g/dL — ABNORMAL LOW (ref 11.6–15.9)
IMMATURE RETIC FRACT: 10.6 % — AB (ref 1.60–10.00)
LYMPH#: 3.2 10*3/uL (ref 0.9–3.3)
LYMPH%: 50.7 % — AB (ref 14.0–49.7)
MCH: 30.7 pg (ref 25.1–34.0)
MCHC: 32.6 g/dL (ref 31.5–36.0)
MCV: 94.2 fL (ref 79.5–101.0)
MONO#: 0.9 10*3/uL (ref 0.1–0.9)
MONO%: 14.2 % — ABNORMAL HIGH (ref 0.0–14.0)
NEUT%: 22.7 % — ABNORMAL LOW (ref 38.4–76.8)
NEUTROS ABS: 1.4 10*3/uL — AB (ref 1.5–6.5)
NRBC: 0 % (ref 0–0)
PLATELETS: 199 10*3/uL (ref 145–400)
RBC: 2.57 10*6/uL — AB (ref 3.70–5.45)
RDW: 18.5 % — ABNORMAL HIGH (ref 11.2–14.5)
RETIC CT ABS: 48.06 10*3/uL (ref 33.70–90.70)
Retic %: 1.87 % (ref 0.70–2.10)
WBC: 6.3 10*3/uL (ref 3.9–10.3)

## 2014-07-21 LAB — COMPREHENSIVE METABOLIC PANEL (CC13)
ALBUMIN: 3.3 g/dL — AB (ref 3.5–5.0)
ALT: 10 U/L (ref 0–55)
ANION GAP: 10 meq/L (ref 3–11)
AST: 13 U/L (ref 5–34)
Alkaline Phosphatase: 71 U/L (ref 40–150)
BUN: 11.6 mg/dL (ref 7.0–26.0)
CHLORIDE: 102 meq/L (ref 98–109)
CO2: 26 meq/L (ref 22–29)
CREATININE: 0.8 mg/dL (ref 0.6–1.1)
Calcium: 8.4 mg/dL (ref 8.4–10.4)
EGFR: 71 mL/min/{1.73_m2} — ABNORMAL LOW (ref >90)
GLUCOSE: 116 mg/dL (ref 70–140)
Potassium: 3.6 mEq/L (ref 3.5–5.1)
Sodium: 138 mEq/L (ref 136–145)
Total Bilirubin: 0.76 mg/dL (ref 0.20–1.20)
Total Protein: 6.5 g/dL (ref 6.4–8.3)

## 2014-07-21 LAB — PREPARE RBC (CROSSMATCH)

## 2014-07-21 LAB — HOLD TUBE, BLOOD BANK

## 2014-07-21 MED ORDER — SODIUM CHLORIDE 0.9 % IV SOLN
250.0000 mL | Freq: Once | INTRAVENOUS | Status: AC
  Administered 2014-07-21: 250 mL via INTRAVENOUS

## 2014-07-21 NOTE — Telephone Encounter (Signed)
Faxed CMET and CBC results to Carlos Levering, PA-C at fax [phone].   Calcium wnl today.

## 2014-07-21 NOTE — Telephone Encounter (Signed)
Call from Dr. Caroleen Hamman office requests we check pt's Calcium today.  It was low last time they checked it at their office.  Added CMET to pt's labs today from Dr. Alvy Bimler.

## 2014-07-22 ENCOUNTER — Other Ambulatory Visit: Payer: Self-pay | Admitting: Family Medicine

## 2014-07-22 DIAGNOSIS — R519 Headache, unspecified: Secondary | ICD-10-CM

## 2014-07-22 DIAGNOSIS — R42 Dizziness and giddiness: Secondary | ICD-10-CM

## 2014-07-22 DIAGNOSIS — R51 Headache: Secondary | ICD-10-CM

## 2014-07-22 LAB — TYPE AND SCREEN
ABO/RH(D): O POS
ANTIBODY SCREEN: NEGATIVE
UNIT DIVISION: 0

## 2014-07-27 ENCOUNTER — Ambulatory Visit
Admission: RE | Admit: 2014-07-27 | Discharge: 2014-07-27 | Disposition: A | Discharge status: Home / Self Care | Payer: Medicare Other | Source: Ambulatory Visit | Attending: Family Medicine | Admitting: Family Medicine

## 2014-07-27 DIAGNOSIS — R42 Dizziness and giddiness: Secondary | ICD-10-CM

## 2014-07-27 DIAGNOSIS — R51 Headache: Secondary | ICD-10-CM

## 2014-07-27 DIAGNOSIS — R519 Headache, unspecified: Secondary | ICD-10-CM

## 2014-07-28 ENCOUNTER — Other Ambulatory Visit: Payer: Medicare Other

## 2014-07-29 ENCOUNTER — Other Ambulatory Visit: Payer: Self-pay | Admitting: *Deleted

## 2014-07-29 ENCOUNTER — Other Ambulatory Visit (HOSPITAL_BASED_OUTPATIENT_CLINIC_OR_DEPARTMENT_OTHER): Payer: Medicare Other

## 2014-07-29 DIAGNOSIS — D46C Myelodysplastic syndrome with isolated del(5q) chromosomal abnormality: Secondary | ICD-10-CM

## 2014-07-29 DIAGNOSIS — D630 Anemia in neoplastic disease: Secondary | ICD-10-CM

## 2014-07-29 DIAGNOSIS — D649 Anemia, unspecified: Secondary | ICD-10-CM

## 2014-07-29 LAB — CBC & DIFF AND RETIC
BASO%: 3.2 % — AB (ref 0.0–2.0)
Basophils Absolute: 0.2 10*3/uL — ABNORMAL HIGH (ref 0.0–0.1)
EOS%: 9.1 % — AB (ref 0.0–7.0)
Eosinophils Absolute: 0.5 10*3/uL (ref 0.0–0.5)
HEMATOCRIT: 29.6 % — AB (ref 34.8–46.6)
HGB: 9.6 g/dL — ABNORMAL LOW (ref 11.6–15.9)
Immature Retic Fract: 5.9 % (ref 1.60–10.00)
LYMPH%: 39.7 % (ref 14.0–49.7)
MCH: 30.6 pg (ref 25.1–34.0)
MCHC: 32.4 g/dL (ref 31.5–36.0)
MCV: 94.3 fL (ref 79.5–101.0)
MONO#: 0.7 10*3/uL (ref 0.1–0.9)
MONO%: 13.3 % (ref 0.0–14.0)
NEUT#: 1.7 10*3/uL (ref 1.5–6.5)
NEUT%: 34.7 % — AB (ref 38.4–76.8)
PLATELETS: 139 10*3/uL — AB (ref 145–400)
RBC: 3.14 10*6/uL — ABNORMAL LOW (ref 3.70–5.45)
RDW: 18.8 % — ABNORMAL HIGH (ref 11.2–14.5)
Retic %: 1.57 % (ref 0.70–2.10)
Retic Ct Abs: 49.3 10*3/uL (ref 33.70–90.70)
WBC: 5 10*3/uL (ref 3.9–10.3)
lymph#: 2 10*3/uL (ref 0.9–3.3)
nRBC: 0 % (ref 0–0)

## 2014-07-29 LAB — HOLD TUBE, BLOOD BANK

## 2014-07-29 LAB — TECHNOLOGIST REVIEW

## 2014-07-29 NOTE — Telephone Encounter (Signed)
THIS REFILL REQUEST FOR REVLIMID WAS PLACED ON DR.GORSUCH'S DESK. 

## 2014-07-30 ENCOUNTER — Other Ambulatory Visit: Payer: Self-pay

## 2014-07-30 ENCOUNTER — Observation Stay (HOSPITAL_COMMUNITY)
Admission: EM | Admit: 2014-07-30 | Discharge: 2014-08-01 | Disposition: A | Discharge status: Home / Self Care | Payer: Medicare Other | Attending: Internal Medicine | Admitting: Internal Medicine

## 2014-07-30 ENCOUNTER — Emergency Department (HOSPITAL_COMMUNITY): Payer: Medicare Other

## 2014-07-30 ENCOUNTER — Encounter (HOSPITAL_COMMUNITY): Payer: Self-pay | Admitting: *Deleted

## 2014-07-30 DIAGNOSIS — I6529 Occlusion and stenosis of unspecified carotid artery: Secondary | ICD-10-CM | POA: Diagnosis not present

## 2014-07-30 DIAGNOSIS — Z85828 Personal history of other malignant neoplasm of skin: Secondary | ICD-10-CM | POA: Insufficient documentation

## 2014-07-30 DIAGNOSIS — R1110 Vomiting, unspecified: Secondary | ICD-10-CM

## 2014-07-30 DIAGNOSIS — J439 Emphysema, unspecified: Secondary | ICD-10-CM

## 2014-07-30 DIAGNOSIS — D630 Anemia in neoplastic disease: Secondary | ICD-10-CM | POA: Insufficient documentation

## 2014-07-30 DIAGNOSIS — E876 Hypokalemia: Secondary | ICD-10-CM | POA: Insufficient documentation

## 2014-07-30 DIAGNOSIS — Z880 Allergy status to penicillin: Secondary | ICD-10-CM | POA: Insufficient documentation

## 2014-07-30 DIAGNOSIS — Z6821 Body mass index (BMI) 21.0-21.9, adult: Secondary | ICD-10-CM | POA: Diagnosis not present

## 2014-07-30 DIAGNOSIS — H8110 Benign paroxysmal vertigo, unspecified ear: Secondary | ICD-10-CM | POA: Insufficient documentation

## 2014-07-30 DIAGNOSIS — Z888 Allergy status to other drugs, medicaments and biological substances status: Secondary | ICD-10-CM | POA: Diagnosis not present

## 2014-07-30 DIAGNOSIS — D6959 Other secondary thrombocytopenia: Secondary | ICD-10-CM | POA: Diagnosis not present

## 2014-07-30 DIAGNOSIS — D696 Thrombocytopenia, unspecified: Secondary | ICD-10-CM | POA: Insufficient documentation

## 2014-07-30 DIAGNOSIS — J449 Chronic obstructive pulmonary disease, unspecified: Secondary | ICD-10-CM | POA: Diagnosis not present

## 2014-07-30 DIAGNOSIS — Z882 Allergy status to sulfonamides status: Secondary | ICD-10-CM | POA: Insufficient documentation

## 2014-07-30 DIAGNOSIS — D46C Myelodysplastic syndrome with isolated del(5q) chromosomal abnormality: Secondary | ICD-10-CM | POA: Diagnosis not present

## 2014-07-30 DIAGNOSIS — M1990 Unspecified osteoarthritis, unspecified site: Secondary | ICD-10-CM | POA: Insufficient documentation

## 2014-07-30 DIAGNOSIS — K219 Gastro-esophageal reflux disease without esophagitis: Secondary | ICD-10-CM | POA: Diagnosis not present

## 2014-07-30 DIAGNOSIS — F329 Major depressive disorder, single episode, unspecified: Secondary | ICD-10-CM | POA: Insufficient documentation

## 2014-07-30 DIAGNOSIS — Z720 Tobacco use: Secondary | ICD-10-CM | POA: Diagnosis present

## 2014-07-30 DIAGNOSIS — R945 Abnormal results of liver function studies: Secondary | ICD-10-CM | POA: Diagnosis present

## 2014-07-30 DIAGNOSIS — E669 Obesity, unspecified: Secondary | ICD-10-CM | POA: Diagnosis not present

## 2014-07-30 DIAGNOSIS — Z881 Allergy status to other antibiotic agents status: Secondary | ICD-10-CM | POA: Insufficient documentation

## 2014-07-30 DIAGNOSIS — I10 Essential (primary) hypertension: Secondary | ICD-10-CM | POA: Diagnosis not present

## 2014-07-30 DIAGNOSIS — F0390 Unspecified dementia without behavioral disturbance: Secondary | ICD-10-CM | POA: Diagnosis not present

## 2014-07-30 DIAGNOSIS — K760 Fatty (change of) liver, not elsewhere classified: Secondary | ICD-10-CM | POA: Diagnosis not present

## 2014-07-30 DIAGNOSIS — Z853 Personal history of malignant neoplasm of breast: Secondary | ICD-10-CM | POA: Insufficient documentation

## 2014-07-30 DIAGNOSIS — J9610 Chronic respiratory failure, unspecified whether with hypoxia or hypercapnia: Secondary | ICD-10-CM | POA: Diagnosis not present

## 2014-07-30 DIAGNOSIS — Z8673 Personal history of transient ischemic attack (TIA), and cerebral infarction without residual deficits: Secondary | ICD-10-CM | POA: Diagnosis not present

## 2014-07-30 DIAGNOSIS — E538 Deficiency of other specified B group vitamins: Secondary | ICD-10-CM | POA: Diagnosis present

## 2014-07-30 DIAGNOSIS — K529 Noninfective gastroenteritis and colitis, unspecified: Principal | ICD-10-CM | POA: Insufficient documentation

## 2014-07-30 DIAGNOSIS — R7989 Other specified abnormal findings of blood chemistry: Secondary | ICD-10-CM | POA: Diagnosis not present

## 2014-07-30 DIAGNOSIS — R112 Nausea with vomiting, unspecified: Secondary | ICD-10-CM | POA: Diagnosis present

## 2014-07-30 DIAGNOSIS — R0602 Shortness of breath: Secondary | ICD-10-CM

## 2014-07-30 DIAGNOSIS — R740 Nonspecific elevation of levels of transaminase and lactic acid dehydrogenase [LDH]: Secondary | ICD-10-CM | POA: Insufficient documentation

## 2014-07-30 DIAGNOSIS — Z87891 Personal history of nicotine dependence: Secondary | ICD-10-CM | POA: Insufficient documentation

## 2014-07-30 DIAGNOSIS — E871 Hypo-osmolality and hyponatremia: Secondary | ICD-10-CM | POA: Insufficient documentation

## 2014-07-30 DIAGNOSIS — R7401 Elevation of levels of liver transaminase levels: Secondary | ICD-10-CM | POA: Diagnosis present

## 2014-07-30 HISTORY — DX: Chronic respiratory failure, unspecified whether with hypoxia or hypercapnia: J96.10

## 2014-07-30 LAB — CBC WITH DIFFERENTIAL/PLATELET
Basophils Absolute: 0.1 10*3/uL (ref 0.0–0.1)
Basophils Relative: 1 % (ref 0–1)
Eosinophils Absolute: 0.1 10*3/uL (ref 0.0–0.7)
Eosinophils Relative: 1 % (ref 0–5)
HEMATOCRIT: 28 % — AB (ref 36.0–46.0)
HEMOGLOBIN: 9.1 g/dL — AB (ref 12.0–15.0)
LYMPHS ABS: 1 10*3/uL (ref 0.7–4.0)
Lymphocytes Relative: 16 % (ref 12–46)
MCH: 31.1 pg (ref 26.0–34.0)
MCHC: 32.5 g/dL (ref 30.0–36.0)
MCV: 95.6 fL (ref 78.0–100.0)
Monocytes Absolute: 1.5 10*3/uL — ABNORMAL HIGH (ref 0.1–1.0)
Monocytes Relative: 24 % — ABNORMAL HIGH (ref 3–12)
Neutro Abs: 3.4 10*3/uL (ref 1.7–7.7)
Neutrophils Relative %: 58 % (ref 43–77)
Platelets: 110 10*3/uL — ABNORMAL LOW (ref 150–400)
RBC: 2.93 MIL/uL — ABNORMAL LOW (ref 3.87–5.11)
RDW: 19 % — ABNORMAL HIGH (ref 11.5–15.5)
WBC: 6.1 10*3/uL (ref 4.0–10.5)

## 2014-07-30 LAB — COMPREHENSIVE METABOLIC PANEL
ALT: 183 U/L — AB (ref 0–35)
AST: 238 U/L — ABNORMAL HIGH (ref 0–37)
Albumin: 3.3 g/dL — ABNORMAL LOW (ref 3.5–5.2)
Alkaline Phosphatase: 62 U/L (ref 39–117)
Anion gap: 11 (ref 5–15)
BILIRUBIN TOTAL: 2.5 mg/dL — AB (ref 0.3–1.2)
BUN: 14 mg/dL (ref 6–23)
CALCIUM: 8 mg/dL — AB (ref 8.4–10.5)
CHLORIDE: 96 meq/L (ref 96–112)
CO2: 25 meq/L (ref 19–32)
Creatinine, Ser: 0.66 mg/dL (ref 0.50–1.10)
GFR calc Af Amer: >90 mL/min (ref >90)
GFR, EST NON AFRICAN AMERICAN: 79 mL/min — AB (ref >90)
Glucose, Bld: 120 mg/dL — ABNORMAL HIGH (ref 70–99)
Potassium: 3 mEq/L — ABNORMAL LOW (ref 3.7–5.3)
Sodium: 132 mEq/L — ABNORMAL LOW (ref 137–147)
Total Protein: 6.4 g/dL (ref 6.0–8.3)

## 2014-07-30 LAB — TROPONIN I: Troponin I: <0.3 ng/mL (ref <0.30)

## 2014-07-30 LAB — URINALYSIS, ROUTINE W REFLEX MICROSCOPIC
Glucose, UA: NEGATIVE mg/dL
HGB URINE DIPSTICK: NEGATIVE
KETONES UR: NEGATIVE mg/dL
NITRITE: NEGATIVE
PH: 6 (ref 5.0–8.0)
Protein, ur: NEGATIVE mg/dL
Specific Gravity, Urine: 1.019 (ref 1.005–1.030)
Urobilinogen, UA: 1 mg/dL (ref 0.0–1.0)

## 2014-07-30 LAB — URINE MICROSCOPIC-ADD ON

## 2014-07-30 LAB — LIPASE, BLOOD: Lipase: 12 U/L (ref 11–59)

## 2014-07-30 LAB — I-STAT CG4 LACTIC ACID, ED: Lactic Acid, Venous: 0.66 mmol/L (ref 0.5–2.2)

## 2014-07-30 LAB — MAGNESIUM: Magnesium: 1.6 mg/dL (ref 1.5–2.5)

## 2014-07-30 MED ORDER — ACETAMINOPHEN 325 MG PO TABS
650.0000 mg | ORAL_TABLET | Freq: Four times a day (QID) | ORAL | Status: DC | PRN
  Administered 2014-07-30 – 2014-07-31 (×2): 650 mg via ORAL
  Filled 2014-07-30 (×2): qty 2

## 2014-07-30 MED ORDER — IPRATROPIUM-ALBUTEROL 0.5-2.5 (3) MG/3ML IN SOLN
3.0000 mL | Freq: Four times a day (QID) | RESPIRATORY_TRACT | Status: DC
  Administered 2014-07-30: 3 mL via RESPIRATORY_TRACT
  Filled 2014-07-30: qty 3

## 2014-07-30 MED ORDER — ONDANSETRON HCL 4 MG/2ML IJ SOLN
4.0000 mg | Freq: Four times a day (QID) | INTRAMUSCULAR | Status: DC | PRN
  Administered 2014-07-31: 4 mg via INTRAVENOUS
  Filled 2014-07-30: qty 2

## 2014-07-30 MED ORDER — ALBUTEROL SULFATE HFA 108 (90 BASE) MCG/ACT IN AERS
1.0000 | INHALATION_SPRAY | Freq: Four times a day (QID) | RESPIRATORY_TRACT | Status: DC | PRN
Start: 2014-07-30 — End: 2014-07-30

## 2014-07-30 MED ORDER — IPRATROPIUM-ALBUTEROL 0.5-2.5 (3) MG/3ML IN SOLN
3.0000 mL | Freq: Three times a day (TID) | RESPIRATORY_TRACT | Status: DC
  Administered 2014-07-31 – 2014-08-01 (×3): 3 mL via RESPIRATORY_TRACT
  Filled 2014-07-30 (×4): qty 3

## 2014-07-30 MED ORDER — LENALIDOMIDE 2.5 MG PO CAPS: 2.5000 mg | ORAL_CAPSULE | Freq: Every day | ORAL | Status: DC

## 2014-07-30 MED ORDER — OXYCODONE HCL 5 MG PO TABS
5.0000 mg | ORAL_TABLET | ORAL | Status: DC | PRN
  Administered 2014-07-31: 5 mg via ORAL
  Filled 2014-07-30: qty 1

## 2014-07-30 MED ORDER — POTASSIUM CHLORIDE CRYS ER 20 MEQ PO TBCR
40.0000 meq | EXTENDED_RELEASE_TABLET | Freq: Once | ORAL | Status: AC
  Administered 2014-07-30: 40 meq via ORAL
  Filled 2014-07-30: qty 2

## 2014-07-30 MED ORDER — ALBUTEROL SULFATE (2.5 MG/3ML) 0.083% IN NEBU: 2.5000 mg | INHALATION_SOLUTION | Freq: Four times a day (QID) | RESPIRATORY_TRACT | Status: DC | PRN

## 2014-07-30 MED ORDER — LISINOPRIL 20 MG PO TABS
20.0000 mg | ORAL_TABLET | Freq: Every day | ORAL | Status: DC
  Administered 2014-07-30 – 2014-08-01 (×3): 20 mg via ORAL
  Filled 2014-07-30 (×3): qty 1

## 2014-07-30 MED ORDER — FLUOXETINE HCL 20 MG PO CAPS
30.0000 mg | ORAL_CAPSULE | Freq: Every morning | ORAL | Status: DC
  Administered 2014-07-31 – 2014-08-01 (×2): 30 mg via ORAL
  Filled 2014-07-30 (×2): qty 1

## 2014-07-30 MED ORDER — PREDNISONE 5 MG PO TABS
5.0000 mg | ORAL_TABLET | Freq: Every day | ORAL | Status: DC
  Administered 2014-07-31 – 2014-08-01 (×2): 5 mg via ORAL
  Filled 2014-07-30 (×3): qty 1

## 2014-07-30 MED ORDER — FLUTICASONE PROPIONATE 50 MCG/ACT NA SUSP
1.0000 | Freq: Every day | NASAL | Status: DC
  Administered 2014-07-31 – 2014-08-01 (×2): 1 via NASAL
  Filled 2014-07-30: qty 16

## 2014-07-30 MED ORDER — HYDROMORPHONE HCL 1 MG/ML IJ SOLN
0.5000 mg | INTRAMUSCULAR | Status: DC | PRN
  Administered 2014-07-31: 0.5 mg via INTRAVENOUS
  Administered 2014-07-31 – 2014-08-01 (×2): 1 mg via INTRAVENOUS
  Filled 2014-07-30 (×3): qty 1

## 2014-07-30 MED ORDER — ALUM & MAG HYDROXIDE-SIMETH 200-200-20 MG/5ML PO SUSP: 30.0000 mL | Freq: Four times a day (QID) | ORAL | Status: DC | PRN

## 2014-07-30 MED ORDER — LORAZEPAM 0.5 MG PO TABS: 0.5000 mg | ORAL_TABLET | Freq: Three times a day (TID) | ORAL | Status: DC | PRN

## 2014-07-30 MED ORDER — METOPROLOL SUCCINATE ER 50 MG PO TB24
50.0000 mg | ORAL_TABLET | Freq: Every evening | ORAL | Status: DC
  Administered 2014-07-30 – 2014-08-01 (×3): 50 mg via ORAL
  Filled 2014-07-30 (×3): qty 1

## 2014-07-30 MED ORDER — ONDANSETRON HCL 4 MG PO TABS: 4.0000 mg | ORAL_TABLET | Freq: Four times a day (QID) | ORAL | Status: DC | PRN

## 2014-07-30 MED ORDER — ACETAMINOPHEN 650 MG RE SUPP: 650.0000 mg | Freq: Four times a day (QID) | RECTAL | Status: DC | PRN

## 2014-07-30 MED ORDER — SODIUM CHLORIDE 0.9 % IV SOLN
1.0000 g | Freq: Once | INTRAVENOUS | Status: AC
  Administered 2014-07-30: 1 g via INTRAVENOUS
  Filled 2014-07-30: qty 10

## 2014-07-30 MED ORDER — SODIUM CHLORIDE 0.9 % IV SOLN
INTRAVENOUS | Status: AC
  Administered 2014-07-30: 21:00:00 via INTRAVENOUS

## 2014-07-30 MED ORDER — ACETAMINOPHEN 325 MG PO TABS
650.0000 mg | ORAL_TABLET | Freq: Once | ORAL | Status: AC
  Administered 2014-07-30: 650 mg via ORAL
  Filled 2014-07-30: qty 2

## 2014-07-30 NOTE — ED Notes (Addendum)
Per EMS. Pt has has had nausea/vomiting since 9PM yesterday and a throbbing headache since this morning; denies diarrhea. Pt states she has thrown up at least 6 times in the past 24 hours. Pt given zofran, 527ml NS en route to hospital, which she states has helped. Pt states she ate sausage that "didn't taste right" at 5PM. Pt states she normally takes tylenol for headache, but was unable due to emesis. Pt O2 sat 89% on RA, pt has hx of COPD and uses O2 at night.

## 2014-07-30 NOTE — ED Provider Notes (Signed)
CSN: [id number]     Arrival date & time 07/30/14  1410 History   First MD Initiated Contact with Patient 07/30/14 1515     Chief Complaint  Patient presents with  . Nausea  . Emesis  . Headache     (Consider location/radiation/quality/duration/timing/severity/associated sxs/prior Treatment) HPI Comments: Patient brought to the ER for evaluation of vomiting. Patient reports onset of nausea and vomiting last night around 9PM. Has vomited multiple times through the night and now the the day. Reports now just "dry heaves". Has not had diarrhea. Denies abdominal pain. Noted to be hypoxic at arrival, not feeling short of breath currently. Does report a throbbing headache.  Patient is a 81 y.o. female presenting with vomiting and headaches.  Emesis Associated symptoms: headaches   Associated symptoms: no abdominal pain   Headache Associated symptoms: nausea and vomiting   Associated symptoms: no abdominal pain     Past Medical History  Diagnosis Date  . COPD (chronic obstructive pulmonary disease)   . Hypertension   . PONV (postoperative nausea and vomiting)   . Dysrhythmia     rapid heart rate  . Stroke 2010    mini stroke  . Pneumonia 2009  . Shortness of breath   . GERD (gastroesophageal reflux disease)     long time ago  . Depression   . DEMENTIA     borderline  . Depression 01/02/2012  . Anemia 01/02/2012  . Artery stenosis 10/30/12    Moderate to severe left extracranial vertebral  . Cancer 20years ago+ 1999    lt breast  left  . SCC (squamous cell carcinoma), leg     Right  LE Dr. [place] Crane  . Arthritis     Osteo-  Spine and Knees  . Vitamin B12 deficiency   . Vertigo, benign positional   . Memory loss   . Blepharospasm     Left eye  . Venous stasis ulcers 2012    Left LE due to trauma  . Cellulitis Jan 01, 2012    Left UE  . Carotid artery occlusion     Bruit- RIGHT  . Occlusion and stenosis of carotid artery without mention of cerebral infarction 11/06/2012  .  Persistent headaches 02/26/2014   Past Surgical History  Procedure Laterality Date  . Abdominal hysterectomy    . Appendectomy    . Breast surgery    . Joint replacement      Lt arm/wrist plates and screws  . Eye surgery      L/R catarects   Family History  Problem Relation Age of Onset  . Adopted: Yes   History  Substance Use Topics  . Smoking status: Former Smoker -- 1.00 packs/day for 68 years    Types: Cigarettes  . Smokeless tobacco: Never Used  . Alcohol Use: No   OB History    No data available     Review of Systems  Gastrointestinal: Positive for nausea and vomiting. Negative for abdominal pain.  Neurological: Positive for headaches.  All other systems reviewed and are negative.     Allergies  Donepezil; Penicillins; Amoxapine and related; Doxycycline; Keflex; Bupropion; Ketek; Levaquin; Sulfa antibiotics; Tramadol; and Zyrtec  Home Medications   Prior to Admission medications   Medication Sig Start Date End Date Taking? Authorizing Provider  acetaminophen (TYLENOL) 325 MG tablet Take 650 mg by mouth every 6 (six) hours as needed for moderate pain.    Yes Historical Provider, MD  albuterol (PROVENTIL HFA;VENTOLIN HFA) 108 (90 BASE) MCG/ACT  inhaler Inhale 1-2 puffs into the lungs every 6 (six) hours as needed for wheezing or shortness of breath (wheezing).   Yes Historical Provider, MD  butalbital-acetaminophen-caffeine (FIORICET) 50-325-40 MG per tablet Take 1 tablet by mouth every 6 (six) hours as needed for headache. 07/14/14 07/14/15 Yes Heath Lark, MD  calcium carbonate (TUMS - DOSED IN MG ELEMENTAL CALCIUM) 500 MG chewable tablet Chew 1 tablet by mouth daily as needed for indigestion or heartburn.   Yes Historical Provider, MD  FLUoxetine (PROZAC) 10 MG capsule Take 30 mg by mouth every morning.    Yes Historical Provider, MD  fluticasone (FLONASE) 50 MCG/ACT nasal spray Place 2 sprays into the nose daily as needed for allergies.    Yes Historical Provider,  MD  ibuprofen (ADVIL,MOTRIN) 200 MG tablet Take 400 mg by mouth every 6 (six) hours as needed for moderate pain.   Yes Historical Provider, MD  ipratropium-albuterol (DUONEB) 0.5-2.5 (3) MG/3ML SOLN Take 3 mLs by nebulization 3 (three) times daily.   Yes Historical Provider, MD  lenalidomide (REVLIMID) 2.5 MG capsule Take 1 capsule (2.5 mg total) by mouth daily. 07/02/14  Yes Heath Lark, MD  lisinopril (PRINIVIL,ZESTRIL) 10 MG tablet Take 20 mg by mouth daily.    Yes Historical Provider, MD  LORazepam (ATIVAN) 0.5 MG tablet Take 0.5 mg by mouth every 8 (eight) hours as needed for anxiety.    Yes Historical Provider, MD  metoprolol succinate (TOPROL-XL) 50 MG 24 hr tablet Take 50 mg by mouth every evening. Take with or immediately following a meal.   Yes Historical Provider, MD  predniSONE (DELTASONE) 5 MG tablet Take 1 tablet (5 mg total) by mouth daily with breakfast. 07/08/14  Yes Ni Gorsuch, MD   BP 170/66 mmHg  Pulse 65  Temp(Src) 98.3 F (36.8 C) (Oral)  Resp 18  SpO2 95% Physical Exam  Constitutional: She is oriented to person, place, and time. She appears well-developed and well-nourished. No distress.  HENT:  Head: Normocephalic and atraumatic.  Right Ear: Hearing normal.  Left Ear: Hearing normal.  Nose: Nose normal.  Mouth/Throat: Oropharynx is clear and moist and mucous membranes are normal.  Eyes: Conjunctivae and EOM are normal. Pupils are equal, round, and reactive to light.  Neck: Normal range of motion. Neck supple.  Cardiovascular: Regular rhythm, S1 normal and S2 normal.  Exam reveals no gallop and no friction rub.   No murmur heard. Pulmonary/Chest: Effort normal. No respiratory distress. She has decreased breath sounds. She has rales. She exhibits no tenderness.  Abdominal: Soft. Normal appearance and bowel sounds are normal. There is no hepatosplenomegaly. There is no tenderness. There is no rebound, no guarding, no tenderness at McBurney's point and negative Murphy's  sign. No hernia.  Musculoskeletal: Normal range of motion.  Neurological: She is alert and oriented to person, place, and time. She has normal strength. No cranial nerve deficit or sensory deficit. Coordination normal. GCS eye subscore is 4. GCS verbal subscore is 5. GCS motor subscore is 6.  Skin: Skin is warm, dry and intact. No rash noted. No cyanosis.  Psychiatric: She has a normal mood and affect. Her speech is normal and behavior is normal. Thought content normal.  Nursing note and vitals reviewed.   ED Course  Procedures (including critical care time) Labs Review Labs Reviewed  CBC WITH DIFFERENTIAL - Abnormal; Notable for the following:    RBC 2.93 (*)    Hemoglobin 9.1 (*)    HCT 28.0 (*)    RDW  19.0 (*)    Platelets 110 (*)    Monocytes Relative 24 (*)    Monocytes Absolute 1.5 (*)    All other components within normal limits  COMPREHENSIVE METABOLIC PANEL - Abnormal; Notable for the following:    Sodium 132 (*)    Potassium 3.0 (*)    Glucose, Bld 120 (*)    Calcium 8.0 (*)    Albumin 3.3 (*)    AST 238 (*)    ALT 183 (*)    Total Bilirubin 2.5 (*)    GFR calc non Af Amer 79 (*)    All other components within normal limits  URINALYSIS, ROUTINE W REFLEX MICROSCOPIC - Abnormal; Notable for the following:    Color, Urine ORANGE (*)    Bilirubin Urine MODERATE (*)    Leukocytes, UA TRACE (*)    All other components within normal limits  LIPASE, BLOOD  TROPONIN I  URINE MICROSCOPIC-ADD ON  PRO B NATRIURETIC PEPTIDE  INFLUENZA PANEL BY PCR (TYPE A & B, H1N1)  I-STAT CG4 LACTIC ACID, ED    Imaging Review US Abdomen Complete  07/30/2014   CLINICAL DATA:  Nausea/ vomiting, elevated LFTs  EXAM: ULTRASOUND ABDOMEN COMPLETE  COMPARISON:  None.  FINDINGS: Gallbladder: Mild gallbladder distention. Layering gallbladder sludge. No pericholecystic fluid. Negative sonographic Murphy's sign.  Common bile duct: Diameter: 4 mm  Liver: 10 x 6 x 9 mm cyst in the right hepatic  lobe. Hyperechoic hepatic parenchyma, suggesting hepatic steatosis. Reidel's lobe configuration.  IVC: Not discretely visualized.  Pancreas: Not discretely visualized due to overlying bowel gas.  Spleen: Size and appearance within normal limits.  Right Kidney: Length: 9.2 cm.  No mass or hydronephrosis.  Left Kidney: Length: 10.3 cm.  No mass or hydronephrosis.  Abdominal aorta: No aneurysm visualized.  Other findings: None.  IMPRESSION: Hepatic steatosis.  Gallbladder sludge, without associated sonographic findings to suggest acute cholecystitis.   Electronically Signed   By: Julian Hy M.D.   On: 07/30/2014 18:44   Dg Abd Acute W/chest  07/30/2014   CLINICAL DATA:  80 year old female with nausea and vomiting since 9 p.m. yesterday with multiple episodes of emesis in the past 24 hr. Lower abdominal pain.  EXAM: ACUTE ABDOMEN SERIES (ABDOMEN 2 VIEW & CHEST 1 VIEW)  COMPARISON:  Chest x-ray 03/29/2011. CT of the abdomen and pelvis 12/19/2013.  FINDINGS: Mild diffuse interstitial prominence and peribronchial cuffing appears to be chronic over numerous prior examinations, potentially indicative of chronic bronchitis. No acute consolidative airspace disease. No pleural effusions. No evidence of pulmonary edema. Heart size is mildly enlarged. The patient is rotated to the right on today's exam, resulting in distortion of the mediastinal contours and reduced diagnostic sensitivity and specificity for mediastinal pathology. Atherosclerosis in the thoracic aorta. Surgical clips in the left axilla, likely from prior nodal dissection.  Gas and stool are seen scattered throughout the colon extending to the level of the distal rectum. No pathologic distension of small bowel is noted. No gross evidence of pneumoperitoneum.  IMPRESSION: 1.   1.  Nonobstructive bowel gas pattern. 2. No pneumoperitoneum. 3. Findings suggestive of chronic bronchitis redemonstrated, as above. 4. Mild cardiomegaly. 5. Atherosclerosis.    Electronically Signed   By: Vinnie Langton M.D.   On: 07/30/2014 15:51     EKG Interpretation None      MDM   Final diagnoses:  Vomiting  Shortness of breath  Abnormal LFTs (liver function tests)    Patient presents to the ER  for evaluation of nausea and vomiting. Symptoms began last night and continued through the night and into today. She is not experiencing abdominal pain, abdominal exam is benign.  At time of arrival to the ER, patient is mildly hypoxic. Room air oxygen saturation was 89% at rest. She does have a history of COPD. Lung examination did reveal some diffuse crackles and diminished breath sounds bilaterally. With her recent history of multiple episodes of emesis, aspiration is considered a possibility. Abdominal x-ray with chest did not show any acute pathology, however.  Patient had blood work which showed elevated LFTs. AST 238, ALT 182, total bilirubin 2.5. Previous records reveal normal LFTs. Etiology of these elevations is unclear at this time. Ultrasound was performed. She does have sludge in her gallbladder, but no evidence of acute cholecystitis. She did not have a Murphy sign on ultrasound and she is not tender in the right upper quadrant currently. Other etiology, such as hepatitis needs to be considered.    Orpah Greek, MD 07/30/14 [phone]

## 2014-07-30 NOTE — ED Notes (Signed)
Bed: WA21 Expected date:  Expected time:  Means of arrival:  Comments: EMS-vomiting

## 2014-07-30 NOTE — Progress Notes (Signed)
CSW attempted to meet with patient at bedside. However, she was asleep. Patients daughter was at bedside. According to daughter, patient stays at home alone and is able to complete here ADL's independently. Daughter stated that she lives 7-8 minutes away from patient and she checks on her daily.  Daughter stated that the patient has a medical alert bracelet and decided to press the button for help instead of calling her because she thought she was busy with a doctors appointment of her own. Daughter stated that she pressed for help because she had been experiencing stomach pain and nausea. Daughter informed CSW that the patient was at the [facility] yesterday for labs. Daughter stated that the patient does not have cancer but goes to the cancer center because of her chronic anemia and COPD.   Daughter says the patient is waiting on labs.  Daughter/[place] [phone]  Willette Brace [phone] ED CSW 07/30/2014 7:55 PM

## 2014-07-30 NOTE — Progress Notes (Addendum)
  PHARMACIST-PHYSICIAN COMMUNICATION  Topic:  Policy for Oral Chemotherapy Medications  Discussion:  When oral chemotherapy medications are ordered by a non-oncologist, a pharmacist will review the inpatient medical record for abnormal lab values, fever or other signs of infection, and other pertinent data.  Your order for lenalidomide (Revlimid) has been discontinued because the patient meets screening criteria which require holding the medication.  These criteria are:  Lenalidomide (Revlimid) hold criteria  ANC < 1  Pltc < 50K  AST or ALT > 3x ULN  Bili > 1.5x ULN  Acute venous thromboembolic event  The lab values of concern are AST 238, ALT 183, and Total Bilirubin 2.5.  An oncologist's approval would be needed to continue the medication despite the elevated lab values.    El TumbaoPh. 07/30/2014 9:04 PM.

## 2014-07-30 NOTE — H&P (Addendum)
[facility] Admission History and Physical       Tonya Mckee SEG:[id number] DOB: [birth year] DOA: 07/30/2014  Referring physician: EDP PCP: [name]  Specialists:   Chief Complaint: Nausea and Vomiting  HPI: Tonya Mckee is a 80 y.o. female with a history of MDS currently on Revlemid Rx, COPD, HTN, remote history of Breast Cancer who presents to the ED with complaints of nausea and vomiting since 9 pm last night.   She reports that she has vomited  6 or more times.   She denies hematemesis, or fever or diarrhea.    She reports lower ABD pain.   She reports that she ate a sausage biscuit from [employer] yesterday afternoon that did not taste right.   She denies being  Around anyoen that has been ill.   In the ED, her labs revealed elevation of  Her LFTs so an US of the ABD was performed which found  Gallbaldder sludge and Hepatic Steatosis, but was negative for findings of Cholecystitis.  She was referred for medical observation.      Review of Systems:  Constitutional: No Weight Loss, No Weight Gain, Night Sweats, Fevers, Chills, Dizziness, Fatigue, or Generalized Weakness HEENT: No Headaches, Difficulty Swallowing,Tooth/Dental Problems,Sore Throat,  No Sneezing, Rhinitis, Ear Ache, Nasal Congestion, or Post Nasal Drip,  Cardio-vascular:  No Chest pain, Orthopnea, PND, Edema in Lower Extremities, Anasarca, Dizziness, Palpitations  Resp: No Dyspnea, No DOE, No Productive Cough, No Non-Productive Cough, No Hemoptysis, No Wheezing.    GI: No Heartburn, Indigestion, Abdominal Pain, +Nausea, +Vomiting, Diarrhea, Hematemesis, Hematochezia, Melena, Change in Bowel Habits,  Loss of Appetite  GU: No Dysuria, Change in Color of Urine, No Urgency or Frequency, No Flank pain.  Musculoskeletal: No Joint Pain or Swelling, No Decreased Range of Motion, No Back Pain.  Neurologic: No Syncope, No Seizures, Muscle Weakness, Paresthesia, Vision Disturbance or Loss, No Diplopia, No  Vertigo, No Difficulty Walking,  Skin: No Rash or Lesions. Psych: No Change in Mood or Affect, No Depression or Anxiety, No Memory loss, No Confusion, or Hallucinations   Past Medical History  Diagnosis Date  . COPD (chronic obstructive pulmonary disease)   . Hypertension   . PONV (postoperative nausea and vomiting)   . Dysrhythmia     rapid heart rate  . Stroke 2010    mini stroke  . Pneumonia 2009  . Shortness of breath   . GERD (gastroesophageal reflux disease)     long time ago  . Depression   . DEMENTIA     borderline  . Depression 01/02/2012  . Anemia 01/02/2012  . Artery stenosis 10/30/12    Moderate to severe left extracranial vertebral  . Cancer 20years ago+ 1999    lt breast  left  . SCC (squamous cell carcinoma), leg     Right  LE Dr. [place] Crane  . Arthritis     Osteo-  Spine and Knees  . Vitamin B12 deficiency   . Vertigo, benign positional   . Memory loss   . Blepharospasm     Left eye  . Venous stasis ulcers 2012    Left LE due to trauma  . Cellulitis Jan 01, 2012    Left UE  . Carotid artery occlusion     Bruit- RIGHT  . Occlusion and stenosis of carotid artery without mention of cerebral infarction 11/06/2012  . Persistent headaches 02/26/2014        Chronic Respiratory Failure on NCO2    Past Surgical History  Procedure Laterality Date  . Abdominal hysterectomy    . Appendectomy    . Breast surgery    . Joint replacement      Lt arm/wrist plates and screws  . Eye surgery      L/R catarects       Prior to Admission medications   Medication Sig Start Date End Date Taking? Authorizing Provider  acetaminophen (TYLENOL) 325 MG tablet Take 650 mg by mouth every 6 (six) hours as needed for moderate pain.    Yes Historical Provider, MD  albuterol (PROVENTIL HFA;VENTOLIN HFA) 108 (90 BASE) MCG/ACT inhaler Inhale 1-2 puffs into the lungs every 6 (six) hours as needed for wheezing or shortness of breath (wheezing).   Yes Historical Provider, MD    butalbital-acetaminophen-caffeine (FIORICET) 50-325-40 MG per tablet Take 1 tablet by mouth every 6 (six) hours as needed for headache. 07/14/14 07/14/15 Yes Heath Lark, MD  calcium carbonate (TUMS - DOSED IN MG ELEMENTAL CALCIUM) 500 MG chewable tablet Chew 1 tablet by mouth daily as needed for indigestion or heartburn.   Yes Historical Provider, MD  FLUoxetine (PROZAC) 10 MG capsule Take 30 mg by mouth every morning.    Yes Historical Provider, MD  fluticasone (FLONASE) 50 MCG/ACT nasal spray Place 2 sprays into the nose daily as needed for allergies.    Yes Historical Provider, MD  ibuprofen (ADVIL,MOTRIN) 200 MG tablet Take 400 mg by mouth every 6 (six) hours as needed for moderate pain.   Yes Historical Provider, MD  ipratropium-albuterol (DUONEB) 0.5-2.5 (3) MG/3ML SOLN Take 3 mLs by nebulization 3 (three) times daily.   Yes Historical Provider, MD  lenalidomide (REVLIMID) 2.5 MG capsule Take 1 capsule (2.5 mg total) by mouth daily. 07/02/14  Yes Heath Lark, MD  lisinopril (PRINIVIL,ZESTRIL) 10 MG tablet Take 20 mg by mouth daily.    Yes Historical Provider, MD  LORazepam (ATIVAN) 0.5 MG tablet Take 0.5 mg by mouth every 8 (eight) hours as needed for anxiety.    Yes Historical Provider, MD  metoprolol succinate (TOPROL-XL) 50 MG 24 hr tablet Take 50 mg by mouth every evening. Take with or immediately following a meal.   Yes Historical Provider, MD  predniSONE (DELTASONE) 5 MG tablet Take 1 tablet (5 mg total) by mouth daily with breakfast. 07/08/14  Yes Heath Lark, MD      Allergies  Allergen Reactions  . Donepezil Nausea And Vomiting  . Penicillins Itching and Nausea And Vomiting  . Amoxapine And Related Nausea Only  . Doxycycline Rash  . Keflex [Cephalexin] Itching  . Bupropion Other (See Comments)    "Headaches on the XL."   . Ketek [Telithromycin] Itching    Dry Mouth  . Levaquin [Levofloxacin In D5w] Other (See Comments)    Insomnia  . Sulfa Antibiotics Nausea And Vomiting   . Tramadol Other (See Comments)    "Made her feel like a zombie."  . Zyrtec [Cetirizine] Other (See Comments)    "made her like a zombie"     Social History:  reports that she has quit smoking. Her smoking use included Cigarettes. She has a 68 pack-year smoking history. She has never used smokeless tobacco. She reports that she does not drink alcohol or use illicit drugs.     Family History  Problem Relation Age of Onset  . Adopted: Yes       Physical Exam:  GEN:  Pleasant Elderly Obese  80 y.o. Caucasian female examined  and in no acute distress; cooperative with  exam Filed Vitals:   07/30/14 1404 07/30/14 1413 07/30/14 1905  BP: 151/70  170/66  Pulse: 74  65  Temp: 98.1 F (36.7 C)  98.3 F (36.8 C)  TempSrc: Oral  Oral  Resp: 16  18  SpO2: 92% 89% 95%   Blood pressure 170/66, pulse 65, temperature 98.3 F (36.8 C), temperature source Oral, resp. rate 18, SpO2 95 %. PSYCH: She is alert and oriented x4; does not appear anxious does not appear depressed; affect is normal HEENT: Normocephalic and Atraumatic, Mucous membranes pink; PERRLA; EOM intact; Fundi:  Benign;  No scleral icterus, Nares: Patent, Oropharynx: Clear, Edentulous on Upper with Upper Denture Present,    Neck:  FROM, No Cervical Lymphadenopathy nor Thyromegaly or Carotid Bruit; No JVD; Breasts:: Not examined CHEST WALL: No tenderness CHEST: Normal respiration, clear to auscultation bilaterally HEART: Regular rate and rhythm; no murmurs rubs or gallops BACK: No kyphosis or scoliosis; No CVA tenderness ABDOMEN: Positive Bowel Sounds, Obese, Soft Non-Tender; No Masses, No Organomegaly. Rectal Exam: Not done EXTREMITIES: No Cyanosis, Clubbing, or Edema; No Ulcerations. Genitalia: not examined PULSES: 2+ and symmetric SKIN: Normal hydration no rash or ulceration CNS:  Alert and Oriented x 4, No focal Deficits Vascular: pulses palpable throughout    Labs on Admission:  Basic Metabolic Panel:  Recent  Labs Lab 07/30/14 1428  NA 132*  K 3.0*  CL 96  CO2 25  GLUCOSE 120*  BUN 14  CREATININE 0.66  CALCIUM 8.0*   Liver Function Tests:  Recent Labs Lab 07/30/14 1428  AST 238*  ALT 183*  ALKPHOS 62  BILITOT 2.5*  PROT 6.4  ALBUMIN 3.3*    Recent Labs Lab 07/30/14 1545  LIPASE 12   No results for input(s): AMMONIA in the last 168 hours. CBC:  Recent Labs Lab 07/29/14 1105 07/30/14 1428  WBC 5.0 6.1  NEUTROABS 1.7 3.4  HGB 9.6* 9.1*  HCT 29.6* 28.0*  MCV 94.3 95.6  PLT 139* 110*   Cardiac Enzymes:  Recent Labs Lab 07/30/14 1545  TROPONINI <0.30    BNP (last 3 results) No results for input(s): PROBNP in the last 8760 hours. CBG: No results for input(s): GLUCAP in the last 168 hours.  Radiological Exams on Admission: US Abdomen Complete  07/30/2014   CLINICAL DATA:  Nausea/ vomiting, elevated LFTs  EXAM: ULTRASOUND ABDOMEN COMPLETE  COMPARISON:  None.  FINDINGS: Gallbladder: Mild gallbladder distention. Layering gallbladder sludge. No pericholecystic fluid. Negative sonographic Murphy's sign.  Common bile duct: Diameter: 4 mm  Liver: 10 x 6 x 9 mm cyst in the right hepatic lobe. Hyperechoic hepatic parenchyma, suggesting hepatic steatosis. Reidel's lobe configuration.  IVC: Not discretely visualized.  Pancreas: Not discretely visualized due to overlying bowel gas.  Spleen: Size and appearance within normal limits.  Right Kidney: Length: 9.2 cm.  No mass or hydronephrosis.  Left Kidney: Length: 10.3 cm.  No mass or hydronephrosis.  Abdominal aorta: No aneurysm visualized.  Other findings: None.  IMPRESSION: Hepatic steatosis.  Gallbladder sludge, without associated sonographic findings to suggest acute cholecystitis.   Electronically Signed   By: Julian Hy M.D.   On: 07/30/2014 18:44   Dg Abd Acute W/chest  07/30/2014   CLINICAL DATA:  80 year old female with nausea and vomiting since 9 p.m. yesterday with multiple episodes of emesis in the past 24 hr.  Lower abdominal pain.  EXAM: ACUTE ABDOMEN SERIES (ABDOMEN 2 VIEW & CHEST 1 VIEW)  COMPARISON:  Chest x-ray 03/29/2011. CT of the abdomen and  pelvis 12/19/2013.  FINDINGS: Mild diffuse interstitial prominence and peribronchial cuffing appears to be chronic over numerous prior examinations, potentially indicative of chronic bronchitis. No acute consolidative airspace disease. No pleural effusions. No evidence of pulmonary edema. Heart size is mildly enlarged. The patient is rotated to the right on today's exam, resulting in distortion of the mediastinal contours and reduced diagnostic sensitivity and specificity for mediastinal pathology. Atherosclerosis in the thoracic aorta. Surgical clips in the left axilla, likely from prior nodal dissection.  Gas and stool are seen scattered throughout the colon extending to the level of the distal rectum. No pathologic distension of small bowel is noted. No gross evidence of pneumoperitoneum.  IMPRESSION: 1.   1.  Nonobstructive bowel gas pattern. 2. No pneumoperitoneum. 3. Findings suggestive of chronic bronchitis redemonstrated, as above. 4. Mild cardiomegaly. 5. Atherosclerosis.   Electronically Signed   By: Vinnie Langton M.D.   On: 07/30/2014 15:51      Assessment/Plan:   80 y.o. female with  Principal Problem:   1.   Acute gastroenteritis/  Acute vomiting   Clear Liquid Diet Advance as Tolerated   IVFs   Anti-Emetics PRN   Active Problems:   2.   Elevated transaminase level-  [place] Negative for cholecystitis, and has +Gallbladder Sludge and +Hepatic Steatosis   Monitor LFTs   send Hepatitis Panel     3.   Hyponatremia- deu to Vomiting   IVFs with NSS   Monitor Sodium Level   Check Urine Na+ Level    4.   Hypokalemia- due to vomiting   Replete K+     5.   Hypertension   Monitor   Continue Lisinopril      6.   COPD (chronic obstructive pulmonary disease)   Continue DuoNebs   Continue ProAir MDI   Monitor O2 Sats    Continue NCO2 at 2  Liters     7.   MDS (myelodysplastic syndrome) with 5q deletion-  Has Pancytopenia, on Revlemid Rx daily for past 6 weeks   Consulted Heme/Onc Dr Alvy Bimler to see in AM   Hold Revlemid Overnight since it can not be ordered by and Non Oncologist per hospital policy     8.   Tobacco abuse   Paitent is decreasing her cigarettes and now smokes only 5 per day   Encouraged to continue to decrease until she no longer smokes    9.    DVT Prophylaxis with SCDs    Code Status:  FULL CODE     Family Communication:   Daughter at Bedside  Disposition Plan:      Observation/ Med/Surg Bed   Time spent:  [place] [facility] Pager [phone]   If [place] Please Contact the Day Rounding Team MD for [facility]  If 7PM-7AM, Please Contact Night-Floor Coverage  [email or web address] Password [email or web address] 07/30/2014, 8:10 PM

## 2014-07-31 ENCOUNTER — Other Ambulatory Visit: Payer: Self-pay | Admitting: *Deleted

## 2014-07-31 DIAGNOSIS — D46C Myelodysplastic syndrome with isolated del(5q) chromosomal abnormality: Secondary | ICD-10-CM

## 2014-07-31 DIAGNOSIS — D630 Anemia in neoplastic disease: Secondary | ICD-10-CM

## 2014-07-31 DIAGNOSIS — R948 Abnormal results of function studies of other organs and systems: Secondary | ICD-10-CM

## 2014-07-31 DIAGNOSIS — R112 Nausea with vomiting, unspecified: Secondary | ICD-10-CM

## 2014-07-31 LAB — HEPATIC FUNCTION PANEL
ALT: 116 U/L — AB (ref 0–35)
AST: 98 U/L — ABNORMAL HIGH (ref 0–37)
Albumin: 3 g/dL — ABNORMAL LOW (ref 3.5–5.2)
Alkaline Phosphatase: 61 U/L (ref 39–117)
BILIRUBIN DIRECT: 1.4 mg/dL — AB (ref 0.0–0.3)
BILIRUBIN INDIRECT: 1 mg/dL — AB (ref 0.3–0.9)
Total Bilirubin: 2.4 mg/dL — ABNORMAL HIGH (ref 0.3–1.2)
Total Protein: 6.1 g/dL (ref 6.0–8.3)

## 2014-07-31 LAB — BASIC METABOLIC PANEL
ANION GAP: 11 (ref 5–15)
BUN: 13 mg/dL (ref 6–23)
CO2: 26 mEq/L (ref 19–32)
CREATININE: 0.65 mg/dL (ref 0.50–1.10)
Calcium: 8.3 mg/dL — ABNORMAL LOW (ref 8.4–10.5)
Chloride: 99 mEq/L (ref 96–112)
GFR calc non Af Amer: 80 mL/min — ABNORMAL LOW (ref >90)
Glucose, Bld: 100 mg/dL — ABNORMAL HIGH (ref 70–99)
Potassium: 3.7 mEq/L (ref 3.7–5.3)
Sodium: 136 mEq/L — ABNORMAL LOW (ref 137–147)

## 2014-07-31 LAB — INFLUENZA PANEL BY PCR (TYPE A & B)
H1N1FLUPCR: NOT DETECTED
INFLAPCR: NEGATIVE
INFLBPCR: NEGATIVE

## 2014-07-31 LAB — HEPATITIS PANEL, ACUTE
HCV AB: NEGATIVE
Hep A IgM: NONREACTIVE
Hep B C IgM: NONREACTIVE
Hepatitis B Surface Ag: NEGATIVE

## 2014-07-31 LAB — CBC
HCT: 26.5 % — ABNORMAL LOW (ref 36.0–46.0)
Hemoglobin: 8.5 g/dL — ABNORMAL LOW (ref 12.0–15.0)
MCH: 30.9 pg (ref 26.0–34.0)
MCHC: 32.1 g/dL (ref 30.0–36.0)
MCV: 96.4 fL (ref 78.0–100.0)
Platelets: 92 10*3/uL — ABNORMAL LOW (ref 150–400)
RBC: 2.75 MIL/uL — ABNORMAL LOW (ref 3.87–5.11)
RDW: 19.3 % — AB (ref 11.5–15.5)
WBC: 7.3 10*3/uL (ref 4.0–10.5)

## 2014-07-31 MED ORDER — PROMETHAZINE HCL 25 MG/ML IJ SOLN
12.5000 mg | Freq: Once | INTRAMUSCULAR | Status: AC
  Administered 2014-07-31: 12.5 mg via INTRAVENOUS
  Filled 2014-07-31: qty 1

## 2014-07-31 MED ORDER — SODIUM CHLORIDE 0.9 % IV SOLN
INTRAVENOUS | Status: AC
  Administered 2014-07-31: 16:00:00 via INTRAVENOUS

## 2014-07-31 MED ORDER — LENALIDOMIDE 2.5 MG PO CAPS: 2.5000 mg | ORAL_CAPSULE | Freq: Every day | ORAL | Status: DC

## 2014-07-31 NOTE — Progress Notes (Signed)
Tonya Mckee   DOB:14-Nov-1928   QM#:[id number]    Subjective: Patient was admitted yesterday due to uncontrolled nausea and vomiting. She thought that was related to some of the foods she ate in the restaurant. Since admission, her nausea has improved. She denies any diarrhea. Denies pain in her abdomen.  Objective:  Filed Vitals:   07/31/14 0457  BP: 152/64  Pulse: 71  Temp: 99.8 F (37.7 C)  Resp: 18     Intake/Output Summary (Last 24 hours) at 07/31/14 1003 Last data filed at 07/31/14 0900  Gross per 24 hour  Intake 828.75 ml  Output      0 ml  Net 828.75 ml    GENERAL:alert, no distress and comfortable SKIN: skin color is pale, texture, turgor are normal, no rashes or significant lesions EYES: normal, Conjunctiva are pale and non-injected, sclera clear OROPHARYNX:no exudate, no erythema and lips, buccal mucosa, and tongue normal  NECK: supple, thyroid normal size, non-tender, without nodularity LYMPH:  no palpable lymphadenopathy in the cervical, axillary or inguinal LUNGS: clear to auscultation and percussion with normal breathing effort HEART: regular rate & rhythm and no murmurs and no lower extremity edema ABDOMEN:abdomen soft, non-tender and normal bowel sounds Musculoskeletal:no cyanosis of digits and no clubbing  NEURO: alert & oriented x 3 with fluent speech, no focal motor/sensory deficits   Labs:  Lab Results  Component Value Date   WBC 7.3 07/31/2014   HGB 8.5* 07/31/2014   HCT 26.5* 07/31/2014   MCV 96.4 07/31/2014   PLT 92* 07/31/2014   NEUTROABS 3.4 07/30/2014    Lab Results  Component Value Date   NA 136* 07/31/2014   K 3.7 07/31/2014   CL 99 07/31/2014   CO2 26 07/31/2014    Studies:  US Abdomen Complete  07/30/2014   CLINICAL DATA:  Nausea/ vomiting, elevated LFTs  EXAM: ULTRASOUND ABDOMEN COMPLETE  COMPARISON:  None.  FINDINGS: Gallbladder: Mild gallbladder distention. Layering gallbladder sludge. No pericholecystic fluid. Negative  sonographic Murphy's sign.  Common bile duct: Diameter: 4 mm  Liver: 10 x 6 x 9 mm cyst in the right hepatic lobe. Hyperechoic hepatic parenchyma, suggesting hepatic steatosis. Reidel's lobe configuration.  IVC: Not discretely visualized.  Pancreas: Not discretely visualized due to overlying bowel gas.  Spleen: Size and appearance within normal limits.  Right Kidney: Length: 9.2 cm.  No mass or hydronephrosis.  Left Kidney: Length: 10.3 cm.  No mass or hydronephrosis.  Abdominal aorta: No aneurysm visualized.  Other findings: None.  IMPRESSION: Hepatic steatosis.  Gallbladder sludge, without associated sonographic findings to suggest acute cholecystitis.   Electronically Signed   By: Julian Hy M.D.   On: 07/30/2014 18:44   Dg Abd Acute W/chest  07/30/2014   CLINICAL DATA:  80 year old female with nausea and vomiting since 9 p.m. yesterday with multiple episodes of emesis in the past 24 hr. Lower abdominal pain.  EXAM: ACUTE ABDOMEN SERIES (ABDOMEN 2 VIEW & CHEST 1 VIEW)  COMPARISON:  Chest x-ray 03/29/2011. CT of the abdomen and pelvis 12/19/2013.  FINDINGS: Mild diffuse interstitial prominence and peribronchial cuffing appears to be chronic over numerous prior examinations, potentially indicative of chronic bronchitis. No acute consolidative airspace disease. No pleural effusions. No evidence of pulmonary edema. Heart size is mildly enlarged. The patient is rotated to the right on today's exam, resulting in distortion of the mediastinal contours and reduced diagnostic sensitivity and specificity for mediastinal pathology. Atherosclerosis in the thoracic aorta. Surgical clips in the left axilla, likely from  prior nodal dissection.  Gas and stool are seen scattered throughout the colon extending to the level of the distal rectum. No pathologic distension of small bowel is noted. No gross evidence of pneumoperitoneum.  IMPRESSION: 1.   1.  Nonobstructive bowel gas pattern. 2. No pneumoperitoneum. 3.  Findings suggestive of chronic bronchitis redemonstrated, as above. 4. Mild cardiomegaly. 5. Atherosclerosis.   Electronically Signed   By: Vinnie Langton M.D.   On: 07/30/2014 15:51    Assessment & Plan:   ASSESSMENT & PLAN:  MDS (myelodysplastic syndrome) with 5q deletion I recommend holding of Revlimid. I reassured the patient that it is okay to hold the treatment for several days. She will resume once her nausea and vomiting has resolved.  Anemia in neoplastic disease She is not symptomatic right now. I will give her blood transfusion if hemoglobin dropped to less than 8 g. Continue weekly blood work monitoring. Her next blood appointment in my office is next week.  Nausea and vomiting This is unrelated to treatment. Continue antiemetics and IV fluids.  Elevated bilirubin This could be due to hemolysis from recent blood transfusion. Continue close observation for now.  Elevated transaminase This could be due to viral infection or gallbladder disease. Defer to primary service for management.  Thrombocytopenia This could be related to recent treatment. Okay to proceed with DVT prophylaxis as long as her platelet count is greater than 50,000. She does not need platelet transfusion.  Hopefully she can be discharged within the next 24-48 hours. She has multiple appointment set up in my office including close blood work monitoring. I will sign off. Call if questions arise. [place], [place], MD 07/31/2014  10:03 AM

## 2014-07-31 NOTE — Progress Notes (Signed)
CARE MANAGEMENT NOTE 07/31/2014  Patient:  Tonya Mckee, Tonya Mckee   Account Number:  [id number]  Date Initiated:  07/31/2014  Documentation initiated by:  Edwyna Shell  Subjective/Objective Assessment:   80 yo female admitted with acute gastroenteritis from home     Action/Plan:   discharge planning   Anticipated DC Date:  08/01/2014   Anticipated DC Plan:  [place]  CM consult      Choice offered to / List presented to:  C-1 Patient           Status of service:  In process, will continue to follow Medicare Important Message given?   (If response is "NO", the following Medicare IM given date fields will be blank) Date Medicare IM given:   Medicare IM given by:   Date Additional Medicare IM given:   Additional Medicare IM given by:    Discharge Disposition:    Per UR Regulation:    If discussed at Long Length of Stay Meetings, dates discussed:    Comments:  07/31/14 Edwyna Shell RN BSN CM [phone] Patient states she lives at home alone and has a walker, O2, and cane. Her daughter is support and rives her to appointments. The patient stated that she is concerned about falling in the home. Discussed HH PT and OT with patient. She stated that since her DME is through [facility] she will stay with them, await orders

## 2014-07-31 NOTE — Progress Notes (Signed)
UR completed 

## 2014-07-31 NOTE — Progress Notes (Signed)
CARE MANAGEMENT NOTE 07/31/2014  Patient:  Tonya Mckee, Tonya Mckee   Account Number:  [id number]  Date Initiated:  07/31/2014  Documentation initiated by:  Edwyna Shell  Subjective/Objective Assessment:   80 yo female admitted with acute gastroenteritis from home     Action/Plan:   discharge planning   Anticipated DC Date:  08/01/2014   Anticipated DC Plan:  [place]  CM consult      Choice offered to / List presented to:  C-1 Patient        [place] arranged  [place].   Status of service:  Completed, signed off Medicare Important Message given?   (If response is "NO", the following Medicare IM given date fields will be blank) Date Medicare IM given:   Medicare IM given by:   Date Additional Medicare IM given:   Additional Medicare IM given by:    Discharge Disposition:  [place]  Per UR Regulation:    If discussed at Long Length of Stay Meetings, dates discussed:    Comments:  07/31/14 Edwyna Shell RN BSN CM [phone] [facility] referral fro PT/OT communicated to [facility] liaison, Cyril Mourning  07/31/14 Edwyna Shell RN BSN CM [phone] Patient states she lives at home alone and has a walker, O2, and cane. Her daughter is support and rives her to appointments. The patient stated that she is concerned about falling in the home. Discussed HH PT and OT with patient. She stated that since her DME is through [facility] she will stay with them, await orders

## 2014-07-31 NOTE — Progress Notes (Signed)
[facility] PROGRESS NOTE  FRAN NEISWONGER XVQ:[id number] DOB: Jan 16, 1929 DOA: 07/30/2014 PCP: [name]  Brief narrative 80 year old female with history of MDS currently on Revlimid and follows with Dr. Alvy Bimler, COPD, hypertension, remote history of breast cancer presented with acute onset of nausea and vomiting. Patient also found to have a transaminitis with ultrasound of the abdomen done showing gallbladder sludge and hepatic steatosis without acute cholecystitis. Patient admitted on observation.  Assessment/Plan: Acute gastroenteritis Appears to be viral versus acute food poisoning after eating outside. No further symptoms today. Reports pain in right lower quadrant after drinking some tears this morning. -Continue IV hydration and when necessary antiemetics. Holding Revlimid -Ultrasound findings as above. LFTs trending down. Noted elevated direct bilirubin . No signs of obstruction. Acute hepatitis panel negative. Flu PCR negative. -Advance diet to full liquid today. If tolerated and LFTs improving can be discharged home tomorrow.  Myelodysplastic syndrome On Revlimid which has been held. Seen by her oncologist today.  Anemia and Thrombocytopenia Likely secondary to recent chemotherapy. Stable at this time.  Hypokalemia Replenished  COPD Stable continue O2 via nasal cannula. Continue duo nebs  DVT prophylaxis SCDs  Diet: Clear liquids, advance as tolerated  Code Status: Full code Family Communication: None at bedside Disposition Plan: Home tomorrow if improved   Consultants:  Dr. Alvy Bimler  Procedures:  ultrasound abdomen  Antibiotics:  None  HPI/Subjective: Recent seen and examined. Denies further nausea or vomiting. Reports right lower quadrant pain after eating some broth  Objective: Filed Vitals:   07/31/14 0457  BP: 152/64  Pulse: 71  Temp: 99.8 F (37.7 C)  Resp: 18    Intake/Output Summary (Last 24 hours) at 07/31/14  1453 Last data filed at 07/31/14 0900  Gross per 24 hour  Intake 828.75 ml  Output      0 ml  Net 828.75 ml   There were no vitals filed for this visit.  Exam:   General:  Elderly female in no acute distress  HEENT: Pallor present, moist oral mucosa  Chest: Clear to auscultation bilaterally  CVS: Normal S1 and S2, no murmurs  Abdomen: Soft, nondistended, nontender, bowel sounds present  Extremities: Warm, no edema  CNS: Alert and oriented       Data Reviewed: Basic Metabolic Panel:  Recent Labs Lab 07/30/14 1428 07/31/14 0540  NA 132* 136*  K 3.0* 3.7  CL 96 99  CO2 25 26  GLUCOSE 120* 100*  BUN 14 13  CREATININE 0.66 0.65  CALCIUM 8.0* 8.3*  MG 1.6  --    Liver Function Tests:  Recent Labs Lab 07/30/14 1428 07/31/14 0540  AST 238* 98*  ALT 183* 116*  ALKPHOS 62 61  BILITOT 2.5* 2.4*  PROT 6.4 6.1  ALBUMIN 3.3* 3.0*    Recent Labs Lab 07/30/14 1545  LIPASE 12   No results for input(s): AMMONIA in the last 168 hours. CBC:  Recent Labs Lab 07/29/14 1105 07/30/14 1428 07/31/14 0540  WBC 5.0 6.1 7.3  NEUTROABS 1.7 3.4  --   HGB 9.6* 9.1* 8.5*  HCT 29.6* 28.0* 26.5*  MCV 94.3 95.6 96.4  PLT 139* 110* 92*   Cardiac Enzymes:  Recent Labs Lab 07/30/14 1545  TROPONINI <0.30   BNP (last 3 results) No results for input(s): PROBNP in the last 8760 hours. CBG: No results for input(s): GLUCAP in the last 168 hours.  Recent Results (from the past 240 hour(s))  TECHNOLOGIST REVIEW     Status: None   Collection  Time: 07/29/14 11:05 AM  Result Value Ref Range Status   Technologist Review   Final    few schistocytes and occ helmet, dysplastic neutrophils and eosinophils, 5%basophils     Studies: US Abdomen Complete  07/30/2014   CLINICAL DATA:  Nausea/ vomiting, elevated LFTs  EXAM: ULTRASOUND ABDOMEN COMPLETE  COMPARISON:  None.  FINDINGS: Gallbladder: Mild gallbladder distention. Layering gallbladder sludge. No pericholecystic  fluid. Negative sonographic Murphy's sign.  Common bile duct: Diameter: 4 mm  Liver: 10 x 6 x 9 mm cyst in the right hepatic lobe. Hyperechoic hepatic parenchyma, suggesting hepatic steatosis. Reidel's lobe configuration.  IVC: Not discretely visualized.  Pancreas: Not discretely visualized due to overlying bowel gas.  Spleen: Size and appearance within normal limits.  Right Kidney: Length: 9.2 cm.  No mass or hydronephrosis.  Left Kidney: Length: 10.3 cm.  No mass or hydronephrosis.  Abdominal aorta: No aneurysm visualized.  Other findings: None.  IMPRESSION: Hepatic steatosis.  Gallbladder sludge, without associated sonographic findings to suggest acute cholecystitis.   Electronically Signed   By: Julian Hy M.D.   On: 07/30/2014 18:44   Dg Abd Acute W/chest  07/30/2014   CLINICAL DATA:  80 year old female with nausea and vomiting since 9 p.m. yesterday with multiple episodes of emesis in the past 24 hr. Lower abdominal pain.  EXAM: ACUTE ABDOMEN SERIES (ABDOMEN 2 VIEW & CHEST 1 VIEW)  COMPARISON:  Chest x-ray 03/29/2011. CT of the abdomen and pelvis 12/19/2013.  FINDINGS: Mild diffuse interstitial prominence and peribronchial cuffing appears to be chronic over numerous prior examinations, potentially indicative of chronic bronchitis. No acute consolidative airspace disease. No pleural effusions. No evidence of pulmonary edema. Heart size is mildly enlarged. The patient is rotated to the right on today's exam, resulting in distortion of the mediastinal contours and reduced diagnostic sensitivity and specificity for mediastinal pathology. Atherosclerosis in the thoracic aorta. Surgical clips in the left axilla, likely from prior nodal dissection.  Gas and stool are seen scattered throughout the colon extending to the level of the distal rectum. No pathologic distension of small bowel is noted. No gross evidence of pneumoperitoneum.  IMPRESSION: 1.   1.  Nonobstructive bowel gas pattern. 2. No  pneumoperitoneum. 3. Findings suggestive of chronic bronchitis redemonstrated, as above. 4. Mild cardiomegaly. 5. Atherosclerosis.   Electronically Signed   By: Vinnie Langton M.D.   On: 07/30/2014 15:51    Scheduled Meds: . FLUoxetine  30 mg Oral q morning - 10a  . fluticasone  1 spray Each Nare Daily  . ipratropium-albuterol  3 mL Nebulization TID  . lisinopril  20 mg Oral Daily  . metoprolol succinate  50 mg Oral QPM  . predniSONE  5 mg Oral Q breakfast   Continuous Infusions:     Time spent: 25 minutes    Taylee Gunnells, [place]  [facility] Pager [phone]. If 7PM-7AM, please contact night-coverage at [email or web address], password [facility] 07/31/2014, 2:53 PM  LOS: 1 day

## 2014-08-01 DIAGNOSIS — J9611 Chronic respiratory failure with hypoxia: Secondary | ICD-10-CM

## 2014-08-01 LAB — HEPATIC FUNCTION PANEL
ALK PHOS: 63 U/L (ref 39–117)
ALT: 75 U/L — AB (ref 0–35)
AST: 42 U/L — AB (ref 0–37)
Albumin: 2.7 g/dL — ABNORMAL LOW (ref 3.5–5.2)
BILIRUBIN DIRECT: 0.6 mg/dL — AB (ref 0.0–0.3)
BILIRUBIN TOTAL: 1.2 mg/dL (ref 0.3–1.2)
Indirect Bilirubin: 0.6 mg/dL (ref 0.3–0.9)
Total Protein: 5.8 g/dL — ABNORMAL LOW (ref 6.0–8.3)

## 2014-08-01 LAB — CBC
HEMATOCRIT: 24.3 % — AB (ref 36.0–46.0)
Hemoglobin: 8 g/dL — ABNORMAL LOW (ref 12.0–15.0)
MCH: 31.7 pg (ref 26.0–34.0)
MCHC: 32.9 g/dL (ref 30.0–36.0)
MCV: 96.4 fL (ref 78.0–100.0)
Platelets: 74 10*3/uL — ABNORMAL LOW (ref 150–400)
RBC: 2.52 MIL/uL — AB (ref 3.87–5.11)
RDW: 19.2 % — ABNORMAL HIGH (ref 11.5–15.5)
WBC: 7.2 10*3/uL (ref 4.0–10.5)

## 2014-08-01 MED ORDER — MENTHOL 3 MG MT LOZG
1.0000 | LOZENGE | OROMUCOSAL | Status: DC | PRN
  Administered 2014-08-01: 3 mg via ORAL
  Filled 2014-08-01 (×2): qty 9

## 2014-08-01 MED ORDER — MENTHOL 3 MG MT LOZG: 1.0000 | LOZENGE | OROMUCOSAL | Status: AC | PRN

## 2014-08-01 NOTE — Evaluation (Signed)
Physical Therapy Evaluation Patient Details Name: Tonya Mckee MRN: [id number] DOB: September 21, 1928 Today's Date: 08/01/2014   History of Present Illness  80 year old female with history of MDS currently on Revlimid, COPD, hypertension, remote history of breast cancer presented with acute onset of nausea and vomiting and admitted for acute gastroenteritis.  Clinical Impression  Pt admitted with above diagnosis. Pt currently with functional limitations due to the deficits listed below (see PT Problem List). Pt assisted with ambulating in hallway, refused RW and required 2 HHA for support.  Pt reports increased weakness and states, "I don't know how I'll do this" in regards to going home.  Attempted to discuss SNF however pt adamantly declined d/c to SNF and states she wants to home upon d/c. Further evaluation limited as pt had "supper" and requested to eat in recliner.  SpO2 dropped to 84% on room air during ambulation so 2L O2 reapplied upon returning to room. Pt will benefit from skilled PT to increase their independence and safety with mobility to allow discharge to the venue listed below.       Follow Up Recommendations SNF;Supervision for mobility/OOB    Equipment Recommendations  Rolling walker with 5" wheels    Recommendations for Other Services       Precautions / Restrictions Precautions Precautions: Fall Precaution Comments: monitor sats      Mobility  Bed Mobility Overal bed mobility: Modified Independent                Transfers Overall transfer level: Needs assistance Equipment used: 2 person hand held assist Transfers: Sit to/from Stand Sit to Stand: Min assist         General transfer comment: pt requested assist for UE support however declined using RW  Ambulation/Gait Ambulation/Gait assistance: Min assist Ambulation Distance (Feet): 40 Feet Assistive device: 2 person hand held assist Gait Pattern/deviations: Step-through pattern;Decreased stride  length     General Gait Details: pt requiring UE support however would not use RW, ambulated without oxygen and SpO2 84% room air, reapplied 2L O2 upon back to room and SpO2 improved to 93%  Stairs            Wheelchair Mobility    Modified Rankin (Stroke Patients Only)       Balance                                             Pertinent Vitals/Pain Pain Assessment: No/denies pain  SpO2 84% room air during ambulation, reapplied 2L O2 upon back to room and SpO2 improved to 93%    Home Living Family/patient expects to be discharged to:: Private residence Living Arrangements: Alone   Type of Home: House       Home Layout: One level Home Equipment: None      Prior Function Level of Independence: Independent               Hand Dominance        Extremity/Trunk Assessment               Lower Extremity Assessment: Generalized weakness         Communication   Communication: No difficulties  Cognition Arousal/Alertness: Awake/alert Behavior During Therapy: WFL for tasks assessed/performed Overall Cognitive Status: Within Functional Limits for tasks assessed  General Comments      Exercises        Assessment/Plan    PT Assessment Patient needs continued PT services  PT Diagnosis Difficulty walking;Generalized weakness   PT Problem List Decreased strength;Decreased activity tolerance;Decreased mobility;Decreased knowledge of use of DME;Cardiopulmonary status limiting activity  PT Treatment Interventions DME instruction;Functional mobility training;Gait training;Therapeutic activities;Therapeutic exercise;Patient/family education   PT Goals (Current goals can be found in the Care Plan section) Acute Rehab PT Goals PT Goal Formulation: With patient Time For Goal Achievement: 08/08/14 Potential to Achieve Goals: Good    Frequency Min 3X/week   Barriers to discharge        Co-evaluation                End of Session Equipment Utilized During Treatment: Gait belt Activity Tolerance: Patient limited by fatigue Patient left: in chair;with call bell/phone within reach Nurse Communication: Mobility status (up in chair, ptaware to call for assist back to bed)    Functional Assessment Tool Used: clinical judgement Functional Limitation: Mobility: Walking and moving around Mobility: Walking and Moving Around Current Status ([id number]): At least 20 percent but less than 40 percent impaired, limited or restricted Mobility: Walking and Moving Around Goal Status [phone]): At least 1 percent but less than 20 percent impaired, limited or restricted    Time: 1433-1444 PT Time Calculation (min) (ACUTE ONLY): 11 min   Charges:   PT Evaluation $Initial PT Evaluation Tier I: 1 Procedure PT Treatments $Gait Training: 8-22 mins   PT G Codes:   Functional Assessment Tool Used: clinical judgement Functional Limitation: Mobility: Walking and moving around    [place] E 08/01/2014, 3:38 PM Carmelia Bake, PT, DPT 08/01/2014 Pager: [phone]

## 2014-08-01 NOTE — Progress Notes (Signed)
UR completed 

## 2014-08-01 NOTE — Discharge Summary (Signed)
Physician Discharge Summary  Tonya Mckee:[id number] DOB: 05/20/1929 DOA: 07/30/2014  PCP: [name]  Admit date: 07/30/2014 Discharge date: 08/01/2014  Time spent: 25 minutes  Recommendations for Outpatient Follow-up:  1. Discharge home with HHPT/ OT 2. Follow up with PCP and oncology as outpt  Discharge Diagnoses:  Principal Problem:   Acute gastroenteritis   Active Problems:   Hypertension   COPD (chronic obstructive pulmonary disease)   MDS (myelodysplastic syndrome) with 5q deletion   Tobacco abuse   Elevated transaminase level   Hyponatremia   Hypokalemia   Abnormal LFTs (liver function tests)   Chronic respiratory failure   Discharge Condition: fair  Diet recommendation:regular  Filed Weights   08/01/14 0820  Weight: 54.2 kg (119 lb 7.8 oz)    History of present illness:  80 year old female with history of MDS currently on Revlimid and follows with Dr. Alvy Bimler, COPD, hypertension, remote history of breast cancer presented with acute onset of nausea and vomiting. Patient also found to have a transaminitis with ultrasound of the abdomen done showing gallbladder sludge and hepatic steatosis without acute cholecystitis. Patient admitted on observation.  Hospital Course:  Acute gastroenteritis Appears to be viral versus acute food poisoning after eating outside. No further symptoms.   -improved with  IV hydration and  antiemetics. Holding Revlimid -Ultrasound findings as above. LFTs trending down. No signs of obstruction. Acute hepatitis panel negative. Flu PCR negative. -tolerating Advance diet  Myelodysplastic syndrome On Revlimid which has been held until outpt follow up with oncologist On low dose prednisone  Anemia and Thrombocytopenia Likely secondary to recent chemotherapy. Stable at this time.  Hypokalemia Replenished  COPD Stable continue O2 via nasal cannula. Continue home inhaler and  nebs   generalised weakness Seen by  PT. Recommends SNF vs home with supervision. Declined SNF. Will arrange HHPT / OT. Reports having rolling walker at home.  HTN  stable. Resume home meds   Patient stable for discharge home with outpt follow up   Code Status: Full code Family Communication: None at bedside Disposition Plan: Home with [facility]   Consultants:  Dr. Alvy Bimler  Procedures:  ultrasound abdomen  Antibiotics:  None    Discharge Exam: Filed Vitals:   08/01/14 1022  BP: 168/64  Pulse: 70  Temp: 98.2 F (36.8 C)  Resp: 18    General: Elderly female in no acute distress  HEENT: moist oral mucosa  Chest: Clear to auscultation bilaterally  CVS: Normal S1 and S2, no murmurs  Abdomen: Soft, nondistended, nontender, bowel sounds present  Extremities: Warm, no edema  CNS: Alert and oriented  Discharge Instructions You were cared for by a hospitalist during your hospital stay. If you have any questions about your discharge medications or the care you received while you were in the hospital after you are discharged, you can call the unit and asked to speak with the hospitalist on call if the hospitalist that took care of you is not available. Once you are discharged, your primary care physician will handle any further medical issues. Please note that NO REFILLS for any discharge medications will be authorized once you are discharged, as it is imperative that you return to your primary care physician (or establish a relationship with a primary care physician if you do not have one) for your aftercare needs so that they can reassess your need for medications and monitor your lab values.   Current Discharge Medication List    START taking these medications   Details  menthol-cetylpyridinium (CEPACOL) 3 MG lozenge Take 1 lozenge (3 mg total) by mouth as needed for sore throat. Qty: 30 tablet, Refills: 0      CONTINUE these medications which have NOT CHANGED   Details  acetaminophen (TYLENOL) 325 MG  tablet Take 650 mg by mouth every 6 (six) hours as needed for moderate pain.     albuterol (PROVENTIL HFA;VENTOLIN HFA) 108 (90 BASE) MCG/ACT inhaler Inhale 1-2 puffs into the lungs every 6 (six) hours as needed for wheezing or shortness of breath (wheezing).    butalbital-acetaminophen-caffeine (FIORICET) 50-325-40 MG per tablet Take 1 tablet by mouth every 6 (six) hours as needed for headache. Qty: 60 tablet, Refills: 0   Associated Diagnoses: Persistent headaches    calcium carbonate (TUMS - DOSED IN MG ELEMENTAL CALCIUM) 500 MG chewable tablet Chew 1 tablet by mouth daily as needed for indigestion or heartburn.    FLUoxetine (PROZAC) 10 MG capsule Take 30 mg by mouth every morning.     fluticasone (FLONASE) 50 MCG/ACT nasal spray Place 2 sprays into the nose daily as needed for allergies.     ibuprofen (ADVIL,MOTRIN) 200 MG tablet Take 400 mg by mouth every 6 (six) hours as needed for moderate pain.    ipratropium-albuterol (DUONEB) 0.5-2.5 (3) MG/3ML SOLN Take 3 mLs by nebulization 3 (three) times daily.    lisinopril (PRINIVIL,ZESTRIL) 10 MG tablet Take 20 mg by mouth daily.     LORazepam (ATIVAN) 0.5 MG tablet Take 0.5 mg by mouth every 8 (eight) hours as needed for anxiety.     metoprolol succinate (TOPROL-XL) 50 MG 24 hr tablet Take 50 mg by mouth every evening. Take with or immediately following a meal.    predniSONE (DELTASONE) 5 MG tablet Take 1 tablet (5 mg total) by mouth daily with breakfast. Qty: 30 tablet, Refills: 1   Associated Diagnoses: MDS (myelodysplastic syndrome) with 5q deletion      STOP taking these medications     lenalidomide (REVLIMID) 2.5 MG capsule        Allergies  Allergen Reactions  . Donepezil Nausea And Vomiting  . Penicillins Itching and Nausea And Vomiting  . Amoxapine And Related Nausea Only  . Doxycycline Rash  . Keflex [Cephalexin] Itching  . Bupropion Other (See Comments)    "Headaches on the XL."   . Ketek [Telithromycin]  Itching    Dry Mouth  . Levaquin [Levofloxacin In D5w] Other (See Comments)    Insomnia  . Sulfa Antibiotics Nausea And Vomiting  . Tramadol Other (See Comments)    "Made her feel like a zombie."  . Zyrtec [Cetirizine] Other (See Comments)    "made her like a zombie"   Follow-up Information    Follow up with [name],[name], PA-C. Schedule an appointment as soon as possible for a visit in 1 week.   Specialty:  Family Medicine   Contact information:   [address] [place] [address] [phone]       Follow up with [facility], NI, MD.   Specialty:  Hematology and Oncology   Why:  as scheduled   Contact information:   [place] [address] [phone]        The results of significant diagnostics from this hospitalization (including imaging, microbiology, ancillary and laboratory) are listed below for reference.    Significant Diagnostic Studies: US Abdomen Complete  07/30/2014   CLINICAL DATA:  Nausea/ vomiting, elevated LFTs  EXAM: ULTRASOUND ABDOMEN COMPLETE  COMPARISON:  None.  FINDINGS: Gallbladder: Mild gallbladder distention. Layering gallbladder sludge. No pericholecystic fluid. Negative sonographic Murphy's sign.  Common bile duct: Diameter: 4 mm  Liver: 10 x 6 x 9 mm cyst in the right hepatic lobe. Hyperechoic hepatic parenchyma, suggesting hepatic steatosis. Reidel's lobe configuration.  IVC: Not discretely visualized.  Pancreas: Not discretely visualized due to overlying bowel gas.  Spleen: Size and appearance within normal limits.  Right Kidney: Length: 9.2 cm.  No mass or hydronephrosis.  Left Kidney: Length: 10.3 cm.  No mass or hydronephrosis.  Abdominal aorta: No aneurysm visualized.  Other findings: None.  IMPRESSION: Hepatic steatosis.  Gallbladder sludge, without associated sonographic findings to suggest acute cholecystitis.   Electronically Signed   By: Julian Hy M.D.   On: 07/30/2014 18:44   Dg Abd Acute  W/chest  07/30/2014   CLINICAL DATA:  80 year old female with nausea and vomiting since 9 p.m. yesterday with multiple episodes of emesis in the past 24 hr. Lower abdominal pain.  EXAM: ACUTE ABDOMEN SERIES (ABDOMEN 2 VIEW & CHEST 1 VIEW)  COMPARISON:  Chest x-ray 03/29/2011. CT of the abdomen and pelvis 12/19/2013.  FINDINGS: Mild diffuse interstitial prominence and peribronchial cuffing appears to be chronic over numerous prior examinations, potentially indicative of chronic bronchitis. No acute consolidative airspace disease. No pleural effusions. No evidence of pulmonary edema. Heart size is mildly enlarged. The patient is rotated to the right on today's exam, resulting in distortion of the mediastinal contours and reduced diagnostic sensitivity and specificity for mediastinal pathology. Atherosclerosis in the thoracic aorta. Surgical clips in the left axilla, likely from prior nodal dissection.  Gas and stool are seen scattered throughout the colon extending to the level of the distal rectum. No pathologic distension of small bowel is noted. No gross evidence of pneumoperitoneum.  IMPRESSION: 1.   1.  Nonobstructive bowel gas pattern. 2. No pneumoperitoneum. 3. Findings suggestive of chronic bronchitis redemonstrated, as above. 4. Mild cardiomegaly. 5. Atherosclerosis.   Electronically Signed   By: Vinnie Langton M.D.   On: 07/30/2014 15:51    Microbiology: Recent Results (from the past 240 hour(s))  TECHNOLOGIST REVIEW     Status: None   Collection Time: 07/29/14 11:05 AM  Result Value Ref Range Status   Technologist Review   Final    few schistocytes and occ helmet, dysplastic neutrophils and eosinophils, 5%basophils     Labs: Basic Metabolic Panel:  Recent Labs Lab 07/30/14 1428 07/31/14 0540  NA 132* 136*  K 3.0* 3.7  CL 96 99  CO2 25 26  GLUCOSE 120* 100*  BUN 14 13  CREATININE 0.66 0.65  CALCIUM 8.0* 8.3*  MG 1.6  --    Liver Function Tests:  Recent Labs Lab  07/30/14 1428 07/31/14 0540 08/01/14 0550  AST 238* 98* 42*  ALT 183* 116* 75*  ALKPHOS 62 61 63  BILITOT 2.5* 2.4* 1.2  PROT 6.4 6.1 5.8*  ALBUMIN 3.3* 3.0* 2.7*    Recent Labs Lab 07/30/14 1545  LIPASE 12   No results for input(s): AMMONIA in the last 168 hours. CBC:  Recent Labs Lab 07/29/14 1105 07/30/14 1428 07/31/14 0540 08/01/14 0550  WBC 5.0 6.1 7.3 7.2  NEUTROABS 1.7 3.4  --   --   HGB 9.6* 9.1* 8.5* 8.0*  HCT 29.6* 28.0* 26.5* 24.3*  MCV 94.3 95.6 96.4 96.4  PLT 139* 110* 92* 74*   Cardiac Enzymes:  Recent Labs Lab 07/30/14 1545  TROPONINI <0.30   BNP: BNP (last 3 results) No  results for input(s): PROBNP in the last 8760 hours. CBG: No results for input(s): GLUCAP in the last 168 hours.     SignedLouellen Molder  [facility] 08/01/2014, 4:44 PM

## 2014-08-04 ENCOUNTER — Other Ambulatory Visit: Payer: Medicare Other

## 2014-08-05 ENCOUNTER — Other Ambulatory Visit: Payer: Self-pay | Admitting: Hematology and Oncology

## 2014-08-05 ENCOUNTER — Ambulatory Visit (HOSPITAL_BASED_OUTPATIENT_CLINIC_OR_DEPARTMENT_OTHER): Payer: Medicare Other | Admitting: Lab

## 2014-08-05 DIAGNOSIS — D630 Anemia in neoplastic disease: Secondary | ICD-10-CM

## 2014-08-05 DIAGNOSIS — D649 Anemia, unspecified: Secondary | ICD-10-CM

## 2014-08-05 DIAGNOSIS — D46C Myelodysplastic syndrome with isolated del(5q) chromosomal abnormality: Secondary | ICD-10-CM | POA: Diagnosis not present

## 2014-08-05 LAB — CBC & DIFF AND RETIC
BASO%: 2.6 % — ABNORMAL HIGH (ref 0.0–2.0)
Basophils Absolute: 0.1 10*3/uL (ref 0.0–0.1)
EOS%: 3.6 % (ref 0.0–7.0)
Eosinophils Absolute: 0.2 10*3/uL (ref 0.0–0.5)
HCT: 24.5 % — ABNORMAL LOW (ref 34.8–46.6)
HGB: 7.9 g/dL — ABNORMAL LOW (ref 11.6–15.9)
Immature Retic Fract: 12.3 % — ABNORMAL HIGH (ref 1.60–10.00)
LYMPH#: 2 10*3/uL (ref 0.9–3.3)
LYMPH%: 37 % (ref 14.0–49.7)
MCH: 30.9 pg (ref 25.1–34.0)
MCHC: 32.2 g/dL (ref 31.5–36.0)
MCV: 95.7 fL (ref 79.5–101.0)
MONO#: 0.9 10*3/uL (ref 0.1–0.9)
MONO%: 17 % — AB (ref 0.0–14.0)
NEUT#: 2.1 10*3/uL (ref 1.5–6.5)
NEUT%: 39.8 % (ref 38.4–76.8)
NRBC: 0 % (ref 0–0)
Platelets: 119 10*3/uL — ABNORMAL LOW (ref 145–400)
RBC: 2.56 10*6/uL — AB (ref 3.70–5.45)
RDW: 19.6 % — AB (ref 11.2–14.5)
Retic %: 1.82 % (ref 0.70–2.10)
Retic Ct Abs: 46.59 10*3/uL (ref 33.70–90.70)
WBC: 5.3 10*3/uL (ref 3.9–10.3)

## 2014-08-05 LAB — HOLD TUBE, BLOOD BANK

## 2014-08-06 ENCOUNTER — Ambulatory Visit (HOSPITAL_BASED_OUTPATIENT_CLINIC_OR_DEPARTMENT_OTHER): Payer: Medicare Other

## 2014-08-06 VITALS — BP 175/63 | HR 54 | Temp 98.4°F | Resp 18

## 2014-08-06 DIAGNOSIS — D46C Myelodysplastic syndrome with isolated del(5q) chromosomal abnormality: Secondary | ICD-10-CM | POA: Diagnosis not present

## 2014-08-06 DIAGNOSIS — D630 Anemia in neoplastic disease: Secondary | ICD-10-CM

## 2014-08-06 LAB — PREPARE RBC (CROSSMATCH)

## 2014-08-06 MED ORDER — SODIUM CHLORIDE 0.9 % IV SOLN
250.0000 mL | Freq: Once | INTRAVENOUS | Status: AC
  Administered 2014-08-06: 250 mL via INTRAVENOUS

## 2014-08-06 MED ORDER — FUROSEMIDE 10 MG/ML IJ SOLN
20.0000 mg | Freq: Once | INTRAMUSCULAR | Status: AC
  Administered 2014-08-06: 20 mg via INTRAVENOUS

## 2014-08-06 NOTE — Patient Instructions (Signed)

## 2014-08-07 LAB — TYPE AND SCREEN
ABO/RH(D): O POS
ANTIBODY SCREEN: NEGATIVE
UNIT DIVISION: 0
Unit division: 0

## 2014-08-11 ENCOUNTER — Other Ambulatory Visit: Payer: Medicare Other

## 2014-08-11 ENCOUNTER — Ambulatory Visit: Payer: Medicare Other | Admitting: Hematology and Oncology

## 2014-08-14 ENCOUNTER — Ambulatory Visit (HOSPITAL_BASED_OUTPATIENT_CLINIC_OR_DEPARTMENT_OTHER): Payer: Medicare Other | Admitting: Hematology and Oncology

## 2014-08-14 ENCOUNTER — Telehealth: Payer: Self-pay | Admitting: Hematology and Oncology

## 2014-08-14 ENCOUNTER — Encounter: Payer: Self-pay | Admitting: Hematology and Oncology

## 2014-08-14 ENCOUNTER — Other Ambulatory Visit (HOSPITAL_BASED_OUTPATIENT_CLINIC_OR_DEPARTMENT_OTHER): Payer: Medicare Other

## 2014-08-14 VITALS — BP 139/52 | HR 49 | Temp 97.6°F | Resp 18 | Ht 62.0 in | Wt 118.5 lb

## 2014-08-14 DIAGNOSIS — D630 Anemia in neoplastic disease: Secondary | ICD-10-CM

## 2014-08-14 DIAGNOSIS — D46C Myelodysplastic syndrome with isolated del(5q) chromosomal abnormality: Secondary | ICD-10-CM

## 2014-08-14 DIAGNOSIS — D6959 Other secondary thrombocytopenia: Secondary | ICD-10-CM

## 2014-08-14 DIAGNOSIS — D649 Anemia, unspecified: Secondary | ICD-10-CM

## 2014-08-14 DIAGNOSIS — T50905A Adverse effect of unspecified drugs, medicaments and biological substances, initial encounter: Secondary | ICD-10-CM

## 2014-08-14 LAB — CBC & DIFF AND RETIC
BASO%: 1.4 % (ref 0.0–2.0)
BASOS ABS: 0.1 10*3/uL (ref 0.0–0.1)
EOS ABS: 0.2 10*3/uL (ref 0.0–0.5)
EOS%: 3.4 % (ref 0.0–7.0)
HCT: 34 % — ABNORMAL LOW (ref 34.8–46.6)
HEMOGLOBIN: 11.2 g/dL — AB (ref 11.6–15.9)
IMMATURE RETIC FRACT: 4 % (ref 1.60–10.00)
LYMPH#: 2.4 10*3/uL (ref 0.9–3.3)
LYMPH%: 37.4 % (ref 14.0–49.7)
MCH: 30.9 pg (ref 25.1–34.0)
MCHC: 32.9 g/dL (ref 31.5–36.0)
MCV: 93.7 fL (ref 79.5–101.0)
MONO#: 0.6 10*3/uL (ref 0.1–0.9)
MONO%: 9.2 % (ref 0.0–14.0)
NEUT%: 48.6 % (ref 38.4–76.8)
NEUTROS ABS: 3.1 10*3/uL (ref 1.5–6.5)
NRBC: 0 % (ref 0–0)
Platelets: 87 10*3/uL — ABNORMAL LOW (ref 145–400)
RBC: 3.63 10*6/uL — AB (ref 3.70–5.45)
RDW: 19.2 % — AB (ref 11.2–14.5)
RETIC %: 0.63 % — AB (ref 0.70–2.10)
RETIC CT ABS: 22.87 10*3/uL — AB (ref 33.70–90.70)
WBC: 6.4 10*3/uL (ref 3.9–10.3)

## 2014-08-14 LAB — HOLD TUBE, BLOOD BANK

## 2014-08-14 LAB — TECHNOLOGIST REVIEW

## 2014-08-14 NOTE — Assessment & Plan Note (Signed)
This is likely due to recent treatment. The patient denies recent history of bleeding such as epistaxis, hematuria or hematochezia. She is asymptomatic from the low platelet count. I will observe for now.  she does not require transfusion now. I will continue the chemotherapy at current dose without dosage adjustment.  If the thrombocytopenia gets progressive worse in the future, I might have to delay her treatment or adjust the chemotherapy dose.   

## 2014-08-14 NOTE — Assessment & Plan Note (Addendum)
The patient does not want Aranesp injection. We discussed the risks, benefit, side effects of Revlimid and she agreed to proceed. She has taken treatment for almost 2 months with no side effects. Plan to taper prednisone to 2.5 mg daily and recheck her blood test every other week. It appears that had need for transfusion has reduced. I plan to increase Revlimid to 5 mg in the future if she tolerates treatment well.

## 2014-08-14 NOTE — Progress Notes (Signed)
[place] OFFICE PROGRESS NOTE  Patient Care Team: Carlos Levering, PA-C as PCP - General (Family Medicine) Lennon Alstrom, MD (Neurology) Carlos Levering, PA-C as Referring Physician (Family Medicine)  SUMMARY OF ONCOLOGIC HISTORY: She was found to have abnormal CBC from routine blood work. Starting around 2012, she has progressive anemia with hemoglobin and the lowest at 9.1.  She denies recent chest pain on exertion but complains of significant shortness of breath on minimal exertion. She has had pre-syncopal episodes and palpitations. She has profound fatigue and takes frequent naps. Recently, she had a fall.  Of note, the patient has 60 pack plus year history and recently was placed on oxygen therapy.  She never has EGD or screening colonoscopy.  She has been taking vitamin B12 supplements for a long time.  She has remote history of left breast cancer detected from mammogram. According to the patient, she had lumpectomy followed by radiation followed by tamoxifen. She does not know the stage of disease but she does not think the lymph nodes were involved.  The patient was prescribed oral iron supplements and she takes them regularly.  On 12/27/13: Bone marrow biopsy did not show evidence of leukemia or metastatic cancer. Cytogenetics and FISH analysis confirm myelodysplastic syndrome with deletion 5Q. The patient was started on erythropoietin stimulating agent to keep hemoglobin greater than 10 g. On 04/15/14: CT head was negative On 04/24/2014, we discontinued darbepoetin as the patient complained of side effects. She was started on prednisone 10 mg daily. On 05/22/14: Prednisone is tapered to 5 mg. On 06/16/2014, she started on Revlimid 2.5 mg daily.  INTERVAL HISTORY: Please see below for problem oriented charting. She denies recent headaches. Denies any chest pain on was then in shortness of breath. She complained of bruising The patient denies any recent signs or  symptoms of bleeding such as spontaneous epistaxis, hematuria or hematochezia.  REVIEW OF SYSTEMS:   Constitutional: Denies fevers, chills or abnormal weight loss Eyes: Denies blurriness of vision Ears, nose, mouth, throat, and face: Denies mucositis or sore throat Respiratory: Denies cough, dyspnea or wheezes Cardiovascular: Denies palpitation, chest discomfort or lower extremity swelling Gastrointestinal:  Denies nausea, heartburn or change in bowel habits Skin: Denies abnormal skin rashes Lymphatics: Denies new lymphadenopathy or easy bruising Neurological:Denies numbness, tingling or new weaknesses Behavioral/Psych: Mood is stable, no new changes  All other systems were reviewed with the patient and are negative.  I have reviewed the past medical history, past surgical history, social history and family history with the patient and they are unchanged from previous note.  ALLERGIES:  is allergic to donepezil; penicillins; amoxapine and related; doxycycline; keflex; bupropion; ketek; levaquin; sulfa antibiotics; tramadol; and zyrtec.  MEDICATIONS:  Current Outpatient Prescriptions  Medication Sig Dispense Refill  . acetaminophen (TYLENOL) 325 MG tablet Take 650 mg by mouth every 6 (six) hours as needed for moderate pain.     Marland Kitchen albuterol (PROVENTIL HFA;VENTOLIN HFA) 108 (90 BASE) MCG/ACT inhaler Inhale 1-2 puffs into the lungs every 6 (six) hours as needed for wheezing or shortness of breath (wheezing).    . calcium carbonate (TUMS - DOSED IN MG ELEMENTAL CALCIUM) 500 MG chewable tablet Chew 1 tablet by mouth daily as needed for indigestion or heartburn.    Marland Kitchen FLUoxetine (PROZAC) 10 MG capsule Take 30 mg by mouth every morning.     . fluticasone (FLONASE) 50 MCG/ACT nasal spray Place 2 sprays into the nose daily as needed for allergies.     Marland Kitchen  ibuprofen (ADVIL,MOTRIN) 200 MG tablet Take 400 mg by mouth every 6 (six) hours as needed for moderate pain.    Marland Kitchen ipratropium-albuterol (DUONEB)  0.5-2.5 (3) MG/3ML SOLN Take 3 mLs by nebulization 3 (three) times daily.    Marland Kitchen lisinopril (PRINIVIL,ZESTRIL) 10 MG tablet Take 20 mg by mouth daily.     Marland Kitchen LORazepam (ATIVAN) 0.5 MG tablet Take 0.5 mg by mouth every 8 (eight) hours as needed for anxiety.     Marland Kitchen menthol-cetylpyridinium (CEPACOL) 3 MG lozenge Take 1 lozenge (3 mg total) by mouth as needed for sore throat. 30 tablet 0  . metoprolol succinate (TOPROL-XL) 50 MG 24 hr tablet Take 50 mg by mouth every evening. Take with or immediately following a meal.    . predniSONE (DELTASONE) 5 MG tablet Take 1 tablet (5 mg total) by mouth daily with breakfast. 30 tablet 1  . butalbital-acetaminophen-caffeine (FIORICET) 50-325-40 MG per tablet Take 1 tablet by mouth every 6 (six) hours as needed for headache. (Patient not taking: Reported on 08/14/2014) 60 tablet 0  . HYDROcodone-acetaminophen (NORCO/VICODIN) 5-325 MG per tablet Take 1 tablet by mouth every 6 (six) hours as needed.  0   No current facility-administered medications for this visit.    PHYSICAL EXAMINATION: ECOG PERFORMANCE STATUS: 1 - Symptomatic but completely ambulatory  Filed Vitals:   08/14/14 1259  BP: 139/52  Pulse: 49  Temp: 97.6 F (36.4 C)  Resp: 18   Filed Weights   08/14/14 1259  Weight: 118 lb 8 oz (53.751 kg)    GENERAL:alert, no distress and comfortable SKIN: skin color, texture, turgor are normal, no rashes or significant lesions. Noted extensive bruises EYES: normal, Conjunctiva are pink and non-injected, sclera clear  Musculoskeletal:no cyanosis of digits and no clubbing  NEURO: alert & oriented x 3 with fluent speech, no focal motor/sensory deficits  LABORATORY DATA:  I have reviewed the data as listed    Component Value Date/Time   NA 136* 07/31/2014 0540   NA 138 07/21/2014 1301   K 3.7 07/31/2014 0540   K 3.6 07/21/2014 1301   CL 99 07/31/2014 0540   CO2 26 07/31/2014 0540   CO2 26 07/21/2014 1301   GLUCOSE 100* 07/31/2014 0540   GLUCOSE  116 07/21/2014 1301   BUN 13 07/31/2014 0540   BUN 11.6 07/21/2014 1301   CREATININE 0.65 07/31/2014 0540   CREATININE 0.8 07/21/2014 1301   CALCIUM 8.3* 07/31/2014 0540   CALCIUM 8.4 07/21/2014 1301   PROT 5.8* 08/01/2014 0550   PROT 6.5 07/21/2014 1301   ALBUMIN 2.7* 08/01/2014 0550   ALBUMIN 3.3* 07/21/2014 1301   AST 42* 08/01/2014 0550   AST 13 07/21/2014 1301   ALT 75* 08/01/2014 0550   ALT 10 07/21/2014 1301   ALKPHOS 63 08/01/2014 0550   ALKPHOS 71 07/21/2014 1301   BILITOT 1.2 08/01/2014 0550   BILITOT 0.76 07/21/2014 1301   GFRNONAA 80* 07/31/2014 0540   GFRAA >90 07/31/2014 0540    No results found for: SPEP, UPEP  Lab Results  Component Value Date   WBC 6.4 08/14/2014   NEUTROABS 3.1 08/14/2014   HGB 11.2* 08/14/2014   HCT 34.0* 08/14/2014   MCV 93.7 08/14/2014   PLT 87* 08/14/2014      Chemistry      Component Value Date/Time   NA 136* 07/31/2014 0540   NA 138 07/21/2014 1301   K 3.7 07/31/2014 0540   K 3.6 07/21/2014 1301   CL 99 07/31/2014 0540  CO2 26 07/31/2014 0540   CO2 26 07/21/2014 1301   BUN 13 07/31/2014 0540   BUN 11.6 07/21/2014 1301   CREATININE 0.65 07/31/2014 0540   CREATININE 0.8 07/21/2014 1301      Component Value Date/Time   CALCIUM 8.3* 07/31/2014 0540   CALCIUM 8.4 07/21/2014 1301   ALKPHOS 63 08/01/2014 0550   ALKPHOS 71 07/21/2014 1301   AST 42* 08/01/2014 0550   AST 13 07/21/2014 1301   ALT 75* 08/01/2014 0550   ALT 10 07/21/2014 1301   BILITOT 1.2 08/01/2014 0550   BILITOT 0.76 07/21/2014 1301     ASSESSMENT & PLAN:  MDS (myelodysplastic syndrome) with 5q deletion The patient does not want Aranesp injection. We discussed the risks, benefit, side effects of Revlimid and she agreed to proceed. She has taken treatment for almost 2 months with no side effects. Plan to taper prednisone to 2.5 mg daily and recheck her blood test every other week. It appears that had need for transfusion has reduced. I plan to  increase Revlimid to 5 mg in the future if she tolerates treatment well.  Anemia in neoplastic disease She is not symptomatic right now. I will give her blood transfusion if hemoglobin dropped to less than 8 g. Continue weekly blood work monitoring.  Thrombocytopenia due to drugs This is likely due to recent treatment. The patient denies recent history of bleeding such as epistaxis, hematuria or hematochezia. She is asymptomatic from the low platelet count. I will observe for now.  she does not require transfusion now. I will continue the chemotherapy at current dose without dosage adjustment.  If the thrombocytopenia gets progressive worse in the future, I might have to delay her treatment or adjust the chemotherapy dose.   No orders of the defined types were placed in this encounter.   All questions were answered. The patient knows to call the clinic with any problems, questions or concerns. No barriers to learning was detected. I spent 15 minutes counseling the patient face to face. The total time spent in the appointment was 20 minutes and more than 50% was on counseling and review of test results     [facility], [place], MD 08/14/2014 1:24 PM

## 2014-08-14 NOTE — Telephone Encounter (Signed)
Gave avs & cal for Jan. °

## 2014-08-14 NOTE — Assessment & Plan Note (Signed)
She is not symptomatic right now. I will give her blood transfusion if hemoglobin dropped to less than 8 g. Continue weekly blood work monitoring.

## 2014-08-19 ENCOUNTER — Telehealth: Payer: Self-pay | Admitting: Hematology and Oncology

## 2014-08-19 NOTE — Telephone Encounter (Signed)
retrned call and s.w daughter r/s appt due to daughter going to be out to town...done....aware of new d.t

## 2014-08-28 ENCOUNTER — Other Ambulatory Visit: Payer: Medicare Other

## 2014-08-28 ENCOUNTER — Ambulatory Visit: Payer: Medicare Other | Admitting: Hematology and Oncology

## 2014-09-02 ENCOUNTER — Telehealth: Payer: Self-pay | Admitting: Hematology and Oncology

## 2014-09-02 ENCOUNTER — Encounter: Payer: Self-pay | Admitting: Hematology and Oncology

## 2014-09-02 ENCOUNTER — Ambulatory Visit (HOSPITAL_BASED_OUTPATIENT_CLINIC_OR_DEPARTMENT_OTHER): Payer: Medicare Other | Admitting: Hematology and Oncology

## 2014-09-02 ENCOUNTER — Other Ambulatory Visit (HOSPITAL_BASED_OUTPATIENT_CLINIC_OR_DEPARTMENT_OTHER): Payer: Medicare Other

## 2014-09-02 VITALS — BP 137/59 | HR 50 | Temp 97.5°F | Resp 18 | Ht 62.0 in | Wt 118.6 lb

## 2014-09-02 DIAGNOSIS — K5909 Other constipation: Secondary | ICD-10-CM | POA: Insufficient documentation

## 2014-09-02 DIAGNOSIS — D630 Anemia in neoplastic disease: Secondary | ICD-10-CM

## 2014-09-02 DIAGNOSIS — R519 Headache, unspecified: Secondary | ICD-10-CM

## 2014-09-02 DIAGNOSIS — D649 Anemia, unspecified: Secondary | ICD-10-CM

## 2014-09-02 DIAGNOSIS — D46C Myelodysplastic syndrome with isolated del(5q) chromosomal abnormality: Secondary | ICD-10-CM

## 2014-09-02 DIAGNOSIS — D6959 Other secondary thrombocytopenia: Secondary | ICD-10-CM

## 2014-09-02 DIAGNOSIS — T50905A Adverse effect of unspecified drugs, medicaments and biological substances, initial encounter: Secondary | ICD-10-CM

## 2014-09-02 DIAGNOSIS — R51 Headache: Secondary | ICD-10-CM

## 2014-09-02 LAB — CBC & DIFF AND RETIC
BASO%: 0.7 % (ref 0.0–2.0)
Basophils Absolute: 0 10*3/uL (ref 0.0–0.1)
EOS ABS: 0.2 10*3/uL (ref 0.0–0.5)
EOS%: 3.9 % (ref 0.0–7.0)
HCT: 28.5 % — ABNORMAL LOW (ref 34.8–46.6)
HGB: 9.3 g/dL — ABNORMAL LOW (ref 11.6–15.9)
IMMATURE RETIC FRACT: 7.9 % (ref 1.60–10.00)
LYMPH#: 2.1 10*3/uL (ref 0.9–3.3)
LYMPH%: 36.8 % (ref 14.0–49.7)
MCH: 31 pg (ref 25.1–34.0)
MCHC: 32.6 g/dL (ref 31.5–36.0)
MCV: 95 fL (ref 79.5–101.0)
MONO#: 0.5 10*3/uL (ref 0.1–0.9)
MONO%: 8.2 % (ref 0.0–14.0)
NEUT#: 2.8 10*3/uL (ref 1.5–6.5)
NEUT%: 50.4 % (ref 38.4–76.8)
Platelets: 80 10*3/uL — ABNORMAL LOW (ref 145–400)
RBC: 3 10*6/uL — ABNORMAL LOW (ref 3.70–5.45)
RDW: 21.5 % — ABNORMAL HIGH (ref 11.2–14.5)
Retic %: 1.78 % (ref 0.70–2.10)
Retic Ct Abs: 53.4 10*3/uL (ref 33.70–90.70)
WBC: 5.6 10*3/uL (ref 3.9–10.3)
nRBC: 0 % (ref 0–0)

## 2014-09-02 LAB — TECHNOLOGIST REVIEW

## 2014-09-02 LAB — HOLD TUBE, BLOOD BANK

## 2014-09-02 MED ORDER — PREDNISONE 2.5 MG PO TABS: 2.5000 mg | ORAL_TABLET | Freq: Every day | ORAL | Status: DC

## 2014-09-02 MED ORDER — BUTALBITAL-APAP-CAFFEINE 50-325-40 MG PO TABS: 1.0000 | ORAL_TABLET | Freq: Four times a day (QID) | ORAL | Status: DC | PRN

## 2014-09-02 NOTE — Assessment & Plan Note (Signed)
She is tolerating Revlimid well. She has become transfusion independent since 4 weeks ago. I recommend we continue at 2.5 mg Revlimid. In the future, I might consider increasing it to 5 mg daily. In the meantime, I will continue her on prednisone 2.5 mg daily.

## 2014-09-02 NOTE — Assessment & Plan Note (Signed)
This is related to recent prescription of pain medicine for headaches. I have discussed continue pain medicine and recommended she takes laxatives regularly.

## 2014-09-02 NOTE — Assessment & Plan Note (Signed)
She is not symptomatic right now. I will give her 2 units of blood transfusion if hemoglobin dropped to less than 8 g. Continue regular blood work monitoring.

## 2014-09-02 NOTE — Assessment & Plan Note (Signed)
Headaches is worse since she initiated prednisone taper. Suspect she may have poor oxygenation of her lungs once we initiated prednisone taper. I refilled her prescription Fioricet. It appears that she may have an element of caffeine withdrawal as well. I also told her to discontinue her blood pressure medication as low blood pressure can also cause headaches.

## 2014-09-02 NOTE — Telephone Encounter (Signed)
Gave avs & calendar for February. Sent message to schedule blood. °

## 2014-09-02 NOTE — Progress Notes (Signed)
[place] OFFICE PROGRESS NOTE  Patient Care Team: Carlos Levering, PA-C as PCP - General (Family Medicine) Lennon Alstrom, MD (Neurology) Carlos Levering, PA-C as Referring Physician (Family Medicine)  SUMMARY OF ONCOLOGIC HISTORY:  She was found to have abnormal CBC from routine blood work. Starting around 2012, she has progressive anemia with hemoglobin and the lowest at 9.1.  She denies recent chest pain on exertion but complains of significant shortness of breath on minimal exertion. She has had pre-syncopal episodes and palpitations. She has profound fatigue and takes frequent naps. Recently, she had a fall.  Of note, the patient has 60 pack plus year history and recently was placed on oxygen therapy.  She never has EGD or screening colonoscopy.  She has been taking vitamin B12 supplements for a long time.  She has remote history of left breast cancer detected from mammogram. According to the patient, she had lumpectomy followed by radiation followed by tamoxifen. She does not know the stage of disease but she does not think the lymph nodes were involved.  The patient was prescribed oral iron supplements and she takes them regularly.  On 12/27/13: Bone marrow biopsy did not show evidence of leukemia or metastatic cancer. Cytogenetics and FISH analysis confirm myelodysplastic syndrome with deletion 5Q. The patient was started on erythropoietin stimulating agent to keep hemoglobin greater than 10 g. On 04/15/14: CT head was negative On 04/24/2014, we discontinued darbepoetin as the patient complained of side effects. She was started on prednisone 10 mg daily. On 05/22/14: Prednisone is tapered to 5 mg. On 06/16/2014, she started on Revlimid 2.5 mg daily.  INTERVAL HISTORY: Please see below for problem oriented charting. She complained of headaches. Recently, with prescription pain medicine, she got constipated, relieved by recent laxative. She complained of fatigue. Denies  any chest pain or shortness of breath.  REVIEW OF SYSTEMS:   Constitutional: Denies fevers, chills or abnormal weight loss Eyes: Denies blurriness of vision Ears, nose, mouth, throat, and face: Denies mucositis or sore throat Respiratory: Denies cough, dyspnea or wheezes Cardiovascular: Denies palpitation, chest discomfort or lower extremity swelling Skin: Denies abnormal skin rashes Lymphatics: Denies new lymphadenopathy or easy bruising Neurological:Denies numbness, tingling or new weaknesses Behavioral/Psych: Mood is stable, no new changes  All other systems were reviewed with the patient and are negative.  I have reviewed the past medical history, past surgical history, social history and family history with the patient and they are unchanged from previous note.  ALLERGIES:  is allergic to donepezil; penicillins; amoxapine and related; doxycycline; keflex; bupropion; ketek; levaquin; sulfa antibiotics; tramadol; and zyrtec.  MEDICATIONS:  Current Outpatient Prescriptions  Medication Sig Dispense Refill  . acetaminophen (TYLENOL) 325 MG tablet Take 650 mg by mouth every 6 (six) hours as needed for moderate pain.     Marland Kitchen albuterol (PROVENTIL HFA;VENTOLIN HFA) 108 (90 BASE) MCG/ACT inhaler Inhale 1-2 puffs into the lungs every 6 (six) hours as needed for wheezing or shortness of breath (wheezing).    . calcium carbonate (TUMS - DOSED IN MG ELEMENTAL CALCIUM) 500 MG chewable tablet Chew 1 tablet by mouth daily as needed for indigestion or heartburn.    . Cholecalciferol (VITAMIN D) 2000 UNITS tablet Take 2,000 Units by mouth daily.    Marland Kitchen FLUoxetine (PROZAC) 10 MG capsule Take 30 mg by mouth every morning.     . fluticasone (FLONASE) 50 MCG/ACT nasal spray Place 2 sprays into the nose daily as needed for allergies.     Marland Kitchen  ibuprofen (ADVIL,MOTRIN) 200 MG tablet Take 400 mg by mouth every 6 (six) hours as needed for moderate pain.    Marland Kitchen ipratropium-albuterol (DUONEB) 0.5-2.5 (3) MG/3ML SOLN Take  3 mLs by nebulization 3 (three) times daily.    Marland Kitchen lisinopril (PRINIVIL,ZESTRIL) 10 MG tablet Take 20 mg by mouth daily.     Marland Kitchen LORazepam (ATIVAN) 0.5 MG tablet Take 0.5 mg by mouth every 8 (eight) hours as needed for anxiety.     Marland Kitchen menthol-cetylpyridinium (CEPACOL) 3 MG lozenge Take 1 lozenge (3 mg total) by mouth as needed for sore throat. 30 tablet 0  . metoprolol succinate (TOPROL-XL) 50 MG 24 hr tablet Take 50 mg by mouth every evening. Take with or immediately following a meal.    . predniSONE (DELTASONE) 2.5 MG tablet Take 1 tablet (2.5 mg total) by mouth daily with breakfast. 30 tablet 3  . butalbital-acetaminophen-caffeine (FIORICET) 50-325-40 MG per tablet Take 1 tablet by mouth every 6 (six) hours as needed for headache. 60 tablet 0   No current facility-administered medications for this visit.    PHYSICAL EXAMINATION: ECOG PERFORMANCE STATUS: 1 - Symptomatic but completely ambulatory  Filed Vitals:   09/02/14 1338  BP: 137/59  Pulse: 50  Temp: 97.5 F (36.4 C)  Resp: 18   Filed Weights   09/02/14 1338  Weight: 118 lb 9.6 oz (53.797 kg)    GENERAL:alert, no distress and comfortable SKIN: skin color is pale, texture, turgor are normal, no rashes or significant lesions EYES: normal, Conjunctiva are pale and non-injected, sclera clear NEURO: alert & oriented x 3 with fluent speech, no focal motor/sensory deficits  LABORATORY DATA:  I have reviewed the data as listed    Component Value Date/Time   NA 136* 07/31/2014 0540   NA 138 07/21/2014 1301   K 3.7 07/31/2014 0540   K 3.6 07/21/2014 1301   CL 99 07/31/2014 0540   CO2 26 07/31/2014 0540   CO2 26 07/21/2014 1301   GLUCOSE 100* 07/31/2014 0540   GLUCOSE 116 07/21/2014 1301   BUN 13 07/31/2014 0540   BUN 11.6 07/21/2014 1301   CREATININE 0.65 07/31/2014 0540   CREATININE 0.8 07/21/2014 1301   CALCIUM 8.3* 07/31/2014 0540   CALCIUM 8.4 07/21/2014 1301   PROT 5.8* 08/01/2014 0550   PROT 6.5 07/21/2014 1301    ALBUMIN 2.7* 08/01/2014 0550   ALBUMIN 3.3* 07/21/2014 1301   AST 42* 08/01/2014 0550   AST 13 07/21/2014 1301   ALT 75* 08/01/2014 0550   ALT 10 07/21/2014 1301   ALKPHOS 63 08/01/2014 0550   ALKPHOS 71 07/21/2014 1301   BILITOT 1.2 08/01/2014 0550   BILITOT 0.76 07/21/2014 1301   GFRNONAA 80* 07/31/2014 0540   GFRAA >90 07/31/2014 0540    No results found for: SPEP, UPEP  Lab Results  Component Value Date   WBC 5.6 09/02/2014   NEUTROABS 2.8 09/02/2014   HGB 9.3* 09/02/2014   HCT 28.5* 09/02/2014   MCV 95.0 09/02/2014   PLT 80* 09/02/2014      Chemistry      Component Value Date/Time   NA 136* 07/31/2014 0540   NA 138 07/21/2014 1301   K 3.7 07/31/2014 0540   K 3.6 07/21/2014 1301   CL 99 07/31/2014 0540   CO2 26 07/31/2014 0540   CO2 26 07/21/2014 1301   BUN 13 07/31/2014 0540   BUN 11.6 07/21/2014 1301   CREATININE 0.65 07/31/2014 0540   CREATININE 0.8 07/21/2014 1301  Component Value Date/Time   CALCIUM 8.3* 07/31/2014 0540   CALCIUM 8.4 07/21/2014 1301   ALKPHOS 63 08/01/2014 0550   ALKPHOS 71 07/21/2014 1301   AST 42* 08/01/2014 0550   AST 13 07/21/2014 1301   ALT 75* 08/01/2014 0550   ALT 10 07/21/2014 1301   BILITOT 1.2 08/01/2014 0550   BILITOT 0.76 07/21/2014 1301     ASSESSMENT & PLAN:  MDS (myelodysplastic syndrome) with 5q deletion She is tolerating Revlimid well. She has become transfusion independent since 4 weeks ago. I recommend we continue at 2.5 mg Revlimid. In the future, I might consider increasing it to 5 mg daily. In the meantime, I will continue her on prednisone 2.5 mg daily.   Anemia in neoplastic disease She is not symptomatic right now. I will give her 2 units of blood transfusion if hemoglobin dropped to less than 8 g. Continue regular blood work monitoring.   Persistent headaches Headaches is worse since she initiated prednisone taper. Suspect she may have poor oxygenation of her lungs once we initiated prednisone  taper. I refilled her prescription Fioricet. It appears that she may have an element of caffeine withdrawal as well. I also told her to discontinue her blood pressure medication as low blood pressure can also cause headaches.   Thrombocytopenia due to drugs This is likely due to recent treatment. The patient denies recent history of bleeding such as epistaxis, hematuria or hematochezia. She is asymptomatic from the low platelet count. I will observe for now.  she does not require transfusion now. I will continue the chemotherapy at current dose without dosage adjustment.  If the thrombocytopenia gets progressive worse in the future, I might have to delay her treatment or adjust the chemotherapy dose.   Other constipation This is related to recent prescription of pain medicine for headaches. I have discussed continue pain medicine and recommended she takes laxatives regularly.    Orders Placed This Encounter  Procedures  . CBC with Differential    Standing Status: Standing     Number of Occurrences: 9     Standing Expiration Date: 09/03/2015  . Hold Tube, Blood Bank    Standing Status: Standing     Number of Occurrences: 9     Standing Expiration Date: 09/03/2015   All questions were answered. The patient knows to call the clinic with any problems, questions or concerns. No barriers to learning was detected. I spent 15 minutes counseling the patient face to face. The total time spent in the appointment was 20 minutes and more than 50% was on counseling and review of test results     [facility], [place], MD 09/02/2014 2:17 PM

## 2014-09-02 NOTE — Assessment & Plan Note (Signed)
This is likely due to recent treatment. The patient denies recent history of bleeding such as epistaxis, hematuria or hematochezia. She is asymptomatic from the low platelet count. I will observe for now.  she does not require transfusion now. I will continue the chemotherapy at current dose without dosage adjustment.  If the thrombocytopenia gets progressive worse in the future, I might have to delay her treatment or adjust the chemotherapy dose.   

## 2014-09-04 ENCOUNTER — Institutional Professional Consult (permissible substitution): Payer: Medicare Other | Admitting: Internal Medicine

## 2014-09-05 ENCOUNTER — Other Ambulatory Visit: Payer: Self-pay | Admitting: *Deleted

## 2014-09-05 ENCOUNTER — Telehealth: Payer: Self-pay | Admitting: *Deleted

## 2014-09-05 NOTE — Telephone Encounter (Signed)
[facility] faxed Revlimid 2.5 mg refill request.  Request to provider's desk/in-basket for review.

## 2014-09-08 ENCOUNTER — Telehealth: Payer: Self-pay | Admitting: *Deleted

## 2014-09-08 ENCOUNTER — Other Ambulatory Visit: Payer: Self-pay | Admitting: *Deleted

## 2014-09-08 MED ORDER — LENALIDOMIDE 2.5 MG PO CAPS: 2.5000 mg | ORAL_CAPSULE | Freq: Every day | ORAL | Status: DC

## 2014-09-08 NOTE — Telephone Encounter (Signed)
No antibiotics now. She can go to the dentist and if dentist wants antibiotics that would be OK

## 2014-09-08 NOTE — Telephone Encounter (Signed)
MESSAGE LEFT ON VM IN TRIAGE  " She has a toothache - can she go to a dentist and if she does can she take an antibiotic "  Dawn left  return call as [phone].  THIS NOTE WILL BE FORWARDED TO MD AND NURSE AT DESK FOR FOLLOW UP

## 2014-09-08 NOTE — Telephone Encounter (Signed)
Informed pt's daughter that it is ok w/ Dr. Alvy Bimler for pt to go to Dentist and ok to take antibiotics if Dentist prescribes them.  She verbalized understanding.

## 2014-09-09 ENCOUNTER — Telehealth: Payer: Self-pay | Admitting: *Deleted

## 2014-09-09 NOTE — Telephone Encounter (Signed)
Pt currenlty at Dentist and daughter states Dentist need to make sure it is ok w/ Dr. Alvy Bimler for pt to have Extractions done and to be put on Antibiotics.   Per Dr. Alvy Bimler, pt may have extractions and Dentist may prescribe any antibiotic they feel is appropriate.   Daughter verbalized understanding.

## 2014-09-09 NOTE — Telephone Encounter (Signed)
[facility] faxed confirmation of facsimile receipt for Revlimid prescription referral.  [facility] will verify insurance and make delivery arrangements with patient. 

## 2014-09-10 ENCOUNTER — Institutional Professional Consult (permissible substitution): Payer: Self-pay | Admitting: Internal Medicine

## 2014-09-13 ENCOUNTER — Emergency Department (HOSPITAL_COMMUNITY)
Admission: EM | Admit: 2014-09-13 | Discharge: 2014-09-13 | Disposition: A | Discharge status: Home / Self Care | Payer: Medicare Other | Attending: Emergency Medicine | Admitting: Emergency Medicine

## 2014-09-13 ENCOUNTER — Encounter (HOSPITAL_COMMUNITY): Payer: Self-pay | Admitting: Emergency Medicine

## 2014-09-13 ENCOUNTER — Emergency Department (HOSPITAL_COMMUNITY): Payer: Medicare Other

## 2014-09-13 DIAGNOSIS — Z79899 Other long term (current) drug therapy: Secondary | ICD-10-CM | POA: Diagnosis not present

## 2014-09-13 DIAGNOSIS — J441 Chronic obstructive pulmonary disease with (acute) exacerbation: Secondary | ICD-10-CM | POA: Insufficient documentation

## 2014-09-13 DIAGNOSIS — Z87891 Personal history of nicotine dependence: Secondary | ICD-10-CM | POA: Diagnosis not present

## 2014-09-13 DIAGNOSIS — Z8673 Personal history of transient ischemic attack (TIA), and cerebral infarction without residual deficits: Secondary | ICD-10-CM | POA: Insufficient documentation

## 2014-09-13 DIAGNOSIS — Z880 Allergy status to penicillin: Secondary | ICD-10-CM | POA: Insufficient documentation

## 2014-09-13 DIAGNOSIS — D649 Anemia, unspecified: Secondary | ICD-10-CM | POA: Diagnosis not present

## 2014-09-13 DIAGNOSIS — Y9289 Other specified places as the place of occurrence of the external cause: Secondary | ICD-10-CM | POA: Insufficient documentation

## 2014-09-13 DIAGNOSIS — Z7952 Long term (current) use of systemic steroids: Secondary | ICD-10-CM | POA: Insufficient documentation

## 2014-09-13 DIAGNOSIS — Z8719 Personal history of other diseases of the digestive system: Secondary | ICD-10-CM | POA: Diagnosis not present

## 2014-09-13 DIAGNOSIS — Z853 Personal history of malignant neoplasm of breast: Secondary | ICD-10-CM | POA: Insufficient documentation

## 2014-09-13 DIAGNOSIS — Z85828 Personal history of other malignant neoplasm of skin: Secondary | ICD-10-CM | POA: Diagnosis not present

## 2014-09-13 DIAGNOSIS — Z872 Personal history of diseases of the skin and subcutaneous tissue: Secondary | ICD-10-CM | POA: Diagnosis not present

## 2014-09-13 DIAGNOSIS — Y998 Other external cause status: Secondary | ICD-10-CM | POA: Diagnosis not present

## 2014-09-13 DIAGNOSIS — M1990 Unspecified osteoarthritis, unspecified site: Secondary | ICD-10-CM | POA: Insufficient documentation

## 2014-09-13 DIAGNOSIS — Y9389 Activity, other specified: Secondary | ICD-10-CM | POA: Diagnosis not present

## 2014-09-13 DIAGNOSIS — F0390 Unspecified dementia without behavioral disturbance: Secondary | ICD-10-CM | POA: Insufficient documentation

## 2014-09-13 DIAGNOSIS — S3992XA Unspecified injury of lower back, initial encounter: Secondary | ICD-10-CM | POA: Insufficient documentation

## 2014-09-13 DIAGNOSIS — Z8701 Personal history of pneumonia (recurrent): Secondary | ICD-10-CM | POA: Insufficient documentation

## 2014-09-13 DIAGNOSIS — Z8639 Personal history of other endocrine, nutritional and metabolic disease: Secondary | ICD-10-CM | POA: Diagnosis not present

## 2014-09-13 DIAGNOSIS — F329 Major depressive disorder, single episode, unspecified: Secondary | ICD-10-CM | POA: Diagnosis not present

## 2014-09-13 DIAGNOSIS — Z8669 Personal history of other diseases of the nervous system and sense organs: Secondary | ICD-10-CM | POA: Insufficient documentation

## 2014-09-13 DIAGNOSIS — T1490XA Injury, unspecified, initial encounter: Secondary | ICD-10-CM

## 2014-09-13 DIAGNOSIS — W06XXXA Fall from bed, initial encounter: Secondary | ICD-10-CM | POA: Diagnosis not present

## 2014-09-13 DIAGNOSIS — I10 Essential (primary) hypertension: Secondary | ICD-10-CM | POA: Insufficient documentation

## 2014-09-13 DIAGNOSIS — M5489 Other dorsalgia: Secondary | ICD-10-CM

## 2014-09-13 MED ORDER — HYDROCODONE-ACETAMINOPHEN 5-325 MG PO TABS: 1.0000 | ORAL_TABLET | ORAL | Status: DC | PRN

## 2014-09-13 MED ORDER — HYDROCODONE-ACETAMINOPHEN 5-325 MG PO TABS
1.0000 | ORAL_TABLET | Freq: Once | ORAL | Status: AC
  Administered 2014-09-13: 1 via ORAL
  Filled 2014-09-13: qty 1

## 2014-09-13 NOTE — ED Notes (Signed)
Bed: WA22 Expected date:  Expected time:  Means of arrival:  Comments: fall 

## 2014-09-13 NOTE — ED Notes (Signed)
Pt fell getting out of bed this morning around 1000. Pain/bruising to rt flank and base of rt skull

## 2014-09-13 NOTE — Discharge Instructions (Signed)
Back Pain, Adult Low back pain is very common. About 1 in 5 people have back pain.The cause of low back pain is rarely dangerous. The pain often gets better over time.About half of people with a sudden onset of back pain feel better in just 2 weeks. About 8 in 10 people feel better by 6 weeks.  CAUSES Some common causes of back pain include:  Strain of the muscles or ligaments supporting the spine.  Wear and tear (degeneration) of the spinal discs.  Arthritis.  Direct injury to the back. DIAGNOSIS Most of the time, the direct cause of low back pain is not known.However, back pain can be treated effectively even when the exact cause of the pain is unknown.Answering your caregiver's questions about your overall health and symptoms is one of the most accurate ways to make sure the cause of your pain is not dangerous. If your caregiver needs more information, he or she may order lab work or imaging tests (X-rays or MRIs).However, even if imaging tests show changes in your back, this usually does not require surgery. HOME CARE INSTRUCTIONS For many people, back pain returns.Since low back pain is rarely dangerous, it is often a condition that people can learn to manageon their own.   Remain active. It is stressful on the back to sit or stand in one place. Do not sit, drive, or stand in one place for more than 30 minutes at a time. Take short walks on level surfaces as soon as pain allows.Try to increase the length of time you walk each day.  Do not stay in bed.Resting more than 1 or 2 days can delay your recovery.  Do not avoid exercise or work.Your body is made to move.It is not dangerous to be active, even though your back may hurt.Your back will likely heal faster if you return to being active before your pain is gone.  Pay attention to your body when you bend and lift. Many people have less discomfortwhen lifting if they bend their knees, keep the load close to their bodies,and  avoid twisting. Often, the most comfortable positions are those that put less stress on your recovering back.  Find a comfortable position to sleep. Use a firm mattress and lie on your side with your knees slightly bent. If you lie on your back, put a pillow under your knees.  Only take over-the-counter or prescription medicines as directed by your caregiver. Over-the-counter medicines to reduce pain and inflammation are often the most helpful.Your caregiver may prescribe muscle relaxant drugs.These medicines help dull your pain so you can more quickly return to your normal activities and healthy exercise.  Put ice on the injured area.  Put ice in a plastic bag.  Place a towel between your skin and the bag.  Leave the ice on for 15-20 minutes, 03-04 times a day for the first 2 to 3 days. After that, ice and heat may be alternated to reduce pain and spasms.  Ask your caregiver about trying back exercises and gentle massage. This may be of some benefit.  Avoid feeling anxious or stressed.Stress increases muscle tension and can worsen back pain.It is important to recognize when you are anxious or stressed and learn ways to manage it.Exercise is a great option. SEEK MEDICAL CARE IF:  You have pain that is not relieved with rest or medicine.  You have pain that does not improve in 1 week.  You have new symptoms.  You are generally not feeling well. SEEK   IMMEDIATE MEDICAL CARE IF:   You have pain that radiates from your back into your legs.  You develop new bowel or bladder control problems.  You have unusual weakness or numbness in your arms or legs.  You develop nausea or vomiting.  You develop abdominal pain.  You feel faint. Document Released: 08/01/2005 Document Revised: 01/31/2012 Document Reviewed: 12/03/2013 ExitCare Patient Information 2015 [employer]. This information is not intended to replace advice given to you by your health care provider. Make sure you  discuss any questions you have with your health care provider.  

## 2014-09-13 NOTE — ED Provider Notes (Signed)
CSN: [id number]     Arrival date & time 09/13/14  1358 History   First MD Initiated Contact with Patient 09/13/14 1541     Chief Complaint  Patient presents with  . Fall     (Consider location/radiation/quality/duration/timing/severity/associated sxs/prior Treatment) HPI Comments: Patient here after a mechanical fall while getting out of her bed. She fell onto a step stool. Did not strike her head and no loss of consciousness. Complains of pain to her right lower lumbar spine. Pain characterized as dull and worse with movement. No radiation down her leg. No symptoms prior to the fall. Took some pain medication and now fills better. Denies any hematuria.  Patient is a 81 y.o. female presenting with fall. The history is provided by the patient and a relative.  Fall    Past Medical History  Diagnosis Date  . COPD (chronic obstructive pulmonary disease)   . Hypertension   . PONV (postoperative nausea and vomiting)   . Dysrhythmia     rapid heart rate  . Stroke 2010    mini stroke  . Pneumonia 2009  . Shortness of breath   . GERD (gastroesophageal reflux disease)     long time ago  . Depression   . DEMENTIA     borderline  . Depression 01/02/2012  . Anemia 01/02/2012  . Artery stenosis 10/30/12    Moderate to severe left extracranial vertebral  . Cancer 20years ago+ 1999    lt breast  left  . SCC (squamous cell carcinoma), leg     Right  LE Dr. [place] Crane  . Arthritis     Osteo-  Spine and Knees  . Vitamin B12 deficiency   . Vertigo, benign positional   . Memory loss   . Blepharospasm     Left eye  . Venous stasis ulcers 2012    Left LE due to trauma  . Cellulitis Jan 01, 2012    Left UE  . Carotid artery occlusion     Bruit- RIGHT  . Occlusion and stenosis of carotid artery without mention of cerebral infarction 11/06/2012  . Persistent headaches 02/26/2014  . Chronic respiratory failure     on NCO2    Past Surgical History  Procedure Laterality Date  . Abdominal  hysterectomy    . Appendectomy    . Breast surgery    . Joint replacement      Lt arm/wrist plates and screws  . Eye surgery      L/R catarects   Family History  Problem Relation Age of Onset  . Adopted: Yes   History  Substance Use Topics  . Smoking status: Former Smoker -- 1.00 packs/day for 68 years    Types: Cigarettes  . Smokeless tobacco: Never Used  . Alcohol Use: No   OB History    No data available     Review of Systems  All other systems reviewed and are negative.     Allergies  Donepezil; Penicillins; Amoxapine and related; Doxycycline; Keflex; Bupropion; Ketek; Levaquin; Sulfa antibiotics; Tramadol; and Zyrtec  Home Medications   Prior to Admission medications   Medication Sig Start Date End Date Taking? Authorizing Provider  acetaminophen (TYLENOL) 325 MG tablet Take 650 mg by mouth every 6 (six) hours as needed for moderate pain (pain).    Yes Historical Provider, MD  albuterol (PROVENTIL HFA;VENTOLIN HFA) 108 (90 BASE) MCG/ACT inhaler Inhale 2 puffs into the lungs every 6 (six) hours as needed for wheezing or shortness of breath (wheezing).  Yes Historical Provider, MD  butalbital-acetaminophen-caffeine (FIORICET) 50-325-40 MG per tablet Take 1 tablet by mouth every 6 (six) hours as needed for headache. 09/02/14 09/02/15 Yes Heath Lark, MD  Cholecalciferol (VITAMIN D) 2000 UNITS tablet Take 2,000 Units by mouth daily.   Yes Historical Provider, MD  FLUoxetine (PROZAC) 10 MG capsule Take 30 mg by mouth every morning.    Yes Historical Provider, MD  fluticasone (FLONASE) 50 MCG/ACT nasal spray Place 1 spray into the nose daily as needed for allergies (allergies).    Yes Historical Provider, MD  ibuprofen (ADVIL,MOTRIN) 200 MG tablet Take 400 mg by mouth every 6 (six) hours as needed for moderate pain (pain).    Yes Historical Provider, MD  ipratropium-albuterol (DUONEB) 0.5-2.5 (3) MG/3ML SOLN Take 3 mLs by nebulization 2 (two) times daily as needed (wheezing).     Yes Historical Provider, MD  lenalidomide (REVLIMID) 2.5 MG capsule Take 1 capsule (2.5 mg total) by mouth daily. 09/08/14  Yes Heath Lark, MD  lisinopril (PRINIVIL,ZESTRIL) 10 MG tablet Take 10 mg by mouth daily.    Yes Historical Provider, MD  LORazepam (ATIVAN) 0.5 MG tablet Take 0.5 mg by mouth every 8 (eight) hours as needed for anxiety (anxiety).    Yes Historical Provider, MD  metoprolol succinate (TOPROL-XL) 50 MG 24 hr tablet Take 50 mg by mouth every evening. Take with or immediately following a meal.   Yes Historical Provider, MD  predniSONE (DELTASONE) 2.5 MG tablet Take 1 tablet (2.5 mg total) by mouth daily with breakfast. 09/02/14  Yes Heath Lark, MD  calcium carbonate (TUMS - DOSED IN MG ELEMENTAL CALCIUM) 500 MG chewable tablet Chew 1 tablet by mouth daily as needed for indigestion or heartburn.    Historical Provider, MD  menthol-cetylpyridinium (CEPACOL) 3 MG lozenge Take 1 lozenge (3 mg total) by mouth as needed for sore throat. 08/01/14   Nishant Dhungel, MD   BP 181/73 mmHg  Pulse 66  Temp(Src) 98 F (36.7 C) (Oral)  Resp 18  SpO2 99% Physical Exam  Constitutional: She is oriented to person, place, and time. She appears well-developed and well-nourished.  Non-toxic appearance. No distress.  HENT:  Head: Normocephalic and atraumatic.  Eyes: Conjunctivae, EOM and lids are normal. Pupils are equal, round, and reactive to light.  Neck: Neck supple. No spinous process tenderness and no muscular tenderness present. No tracheal deviation present. No thyroid mass present.  Cardiovascular: Normal rate, regular rhythm and normal heart sounds.  Exam reveals no gallop.   No murmur heard. Pulmonary/Chest: Effort normal and breath sounds normal. No stridor. No respiratory distress. She has no decreased breath sounds. She has no wheezes. She has no rhonchi. She has no rales. She exhibits no tenderness and no crepitus.  Abdominal: Soft. Normal appearance and bowel sounds are normal.  She exhibits no distension. There is no tenderness. There is no rebound and no CVA tenderness.  Musculoskeletal: Normal range of motion. She exhibits no edema or tenderness.       Back:  Neurological: She is alert and oriented to person, place, and time. She has normal strength. No cranial nerve deficit or sensory deficit. GCS eye subscore is 4. GCS verbal subscore is 5. GCS motor subscore is 6.  Skin: Skin is warm and dry. No abrasion and no rash noted.  Psychiatric: She has a normal mood and affect. Her speech is normal and behavior is normal.  Nursing note and vitals reviewed.   ED Course  Procedures (including critical care time)  Labs Review Labs Reviewed - No data to display  Imaging Review No results found.   EKG Interpretation None      MDM   Final diagnoses:  Trauma    Patient given Vicodin for pain. Her. X-rays normal. Stable for discharge    Leota Jacobsen, MD 09/13/14 1806

## 2014-09-15 ENCOUNTER — Observation Stay (HOSPITAL_COMMUNITY)
Admission: EM | Admit: 2014-09-15 | Discharge: 2014-09-17 | Disposition: A | Discharge status: Skilled Nursing Facility | Payer: Medicare Other | Attending: Internal Medicine | Admitting: Internal Medicine

## 2014-09-15 ENCOUNTER — Emergency Department (HOSPITAL_COMMUNITY): Payer: Medicare Other

## 2014-09-15 ENCOUNTER — Ambulatory Visit (HOSPITAL_COMMUNITY)
Admission: EM | Admit: 2014-09-15 | Discharge: 2014-09-15 | Disposition: A | Discharge status: Home / Self Care | Payer: Medicare Other | Source: Home / Self Care | Attending: Hematology and Oncology | Admitting: Hematology and Oncology

## 2014-09-15 ENCOUNTER — Encounter (HOSPITAL_COMMUNITY): Payer: Self-pay | Admitting: Emergency Medicine

## 2014-09-15 ENCOUNTER — Observation Stay (HOSPITAL_COMMUNITY): Payer: Medicare Other

## 2014-09-15 DIAGNOSIS — Z8673 Personal history of transient ischemic attack (TIA), and cerebral infarction without residual deficits: Secondary | ICD-10-CM | POA: Diagnosis not present

## 2014-09-15 DIAGNOSIS — J449 Chronic obstructive pulmonary disease, unspecified: Secondary | ICD-10-CM | POA: Diagnosis not present

## 2014-09-15 DIAGNOSIS — S32029A Unspecified fracture of second lumbar vertebra, initial encounter for closed fracture: Secondary | ICD-10-CM | POA: Insufficient documentation

## 2014-09-15 DIAGNOSIS — W19XXXA Unspecified fall, initial encounter: Secondary | ICD-10-CM | POA: Diagnosis present

## 2014-09-15 DIAGNOSIS — D6959 Other secondary thrombocytopenia: Secondary | ICD-10-CM | POA: Diagnosis not present

## 2014-09-15 DIAGNOSIS — Y92003 Bedroom of unspecified non-institutional (private) residence as the place of occurrence of the external cause: Secondary | ICD-10-CM | POA: Diagnosis not present

## 2014-09-15 DIAGNOSIS — E876 Hypokalemia: Secondary | ICD-10-CM | POA: Insufficient documentation

## 2014-09-15 DIAGNOSIS — J9611 Chronic respiratory failure with hypoxia: Secondary | ICD-10-CM | POA: Diagnosis not present

## 2014-09-15 DIAGNOSIS — S32019A Unspecified fracture of first lumbar vertebra, initial encounter for closed fracture: Secondary | ICD-10-CM | POA: Insufficient documentation

## 2014-09-15 DIAGNOSIS — S32009A Unspecified fracture of unspecified lumbar vertebra, initial encounter for closed fracture: Secondary | ICD-10-CM | POA: Diagnosis present

## 2014-09-15 DIAGNOSIS — I6529 Occlusion and stenosis of unspecified carotid artery: Secondary | ICD-10-CM | POA: Diagnosis not present

## 2014-09-15 DIAGNOSIS — S32008A Other fracture of unspecified lumbar vertebra, initial encounter for closed fracture: Secondary | ICD-10-CM

## 2014-09-15 DIAGNOSIS — I10 Essential (primary) hypertension: Secondary | ICD-10-CM | POA: Diagnosis not present

## 2014-09-15 DIAGNOSIS — W06XXXA Fall from bed, initial encounter: Secondary | ICD-10-CM | POA: Diagnosis not present

## 2014-09-15 DIAGNOSIS — D630 Anemia in neoplastic disease: Secondary | ICD-10-CM | POA: Insufficient documentation

## 2014-09-15 DIAGNOSIS — J9610 Chronic respiratory failure, unspecified whether with hypoxia or hypercapnia: Secondary | ICD-10-CM | POA: Diagnosis present

## 2014-09-15 DIAGNOSIS — F329 Major depressive disorder, single episode, unspecified: Secondary | ICD-10-CM | POA: Insufficient documentation

## 2014-09-15 DIAGNOSIS — D46C Myelodysplastic syndrome with isolated del(5q) chromosomal abnormality: Secondary | ICD-10-CM | POA: Insufficient documentation

## 2014-09-15 DIAGNOSIS — Z791 Long term (current) use of non-steroidal anti-inflammatories (NSAID): Secondary | ICD-10-CM | POA: Insufficient documentation

## 2014-09-15 DIAGNOSIS — Z79899 Other long term (current) drug therapy: Secondary | ICD-10-CM | POA: Insufficient documentation

## 2014-09-15 DIAGNOSIS — Z7951 Long term (current) use of inhaled steroids: Secondary | ICD-10-CM | POA: Diagnosis not present

## 2014-09-15 DIAGNOSIS — F17210 Nicotine dependence, cigarettes, uncomplicated: Secondary | ICD-10-CM | POA: Diagnosis not present

## 2014-09-15 DIAGNOSIS — M545 Low back pain: Secondary | ICD-10-CM | POA: Diagnosis present

## 2014-09-15 DIAGNOSIS — S32039A Unspecified fracture of third lumbar vertebra, initial encounter for closed fracture: Principal | ICD-10-CM | POA: Insufficient documentation

## 2014-09-15 DIAGNOSIS — Z9981 Dependence on supplemental oxygen: Secondary | ICD-10-CM | POA: Diagnosis not present

## 2014-09-15 DIAGNOSIS — R51 Headache: Secondary | ICD-10-CM

## 2014-09-15 DIAGNOSIS — F0390 Unspecified dementia without behavioral disturbance: Secondary | ICD-10-CM | POA: Insufficient documentation

## 2014-09-15 DIAGNOSIS — F419 Anxiety disorder, unspecified: Secondary | ICD-10-CM | POA: Insufficient documentation

## 2014-09-15 DIAGNOSIS — Z720 Tobacco use: Secondary | ICD-10-CM | POA: Diagnosis present

## 2014-09-15 DIAGNOSIS — R109 Unspecified abdominal pain: Secondary | ICD-10-CM | POA: Diagnosis not present

## 2014-09-15 DIAGNOSIS — K5909 Other constipation: Secondary | ICD-10-CM

## 2014-09-15 DIAGNOSIS — Z7952 Long term (current) use of systemic steroids: Secondary | ICD-10-CM | POA: Insufficient documentation

## 2014-09-15 DIAGNOSIS — R519 Headache, unspecified: Secondary | ICD-10-CM

## 2014-09-15 LAB — URINALYSIS, ROUTINE W REFLEX MICROSCOPIC
Bilirubin Urine: NEGATIVE
Glucose, UA: NEGATIVE mg/dL
Hgb urine dipstick: NEGATIVE
KETONES UR: 15 mg/dL — AB
Leukocytes, UA: NEGATIVE
NITRITE: NEGATIVE
PROTEIN: NEGATIVE mg/dL
Specific Gravity, Urine: 1.017 (ref 1.005–1.030)
Urobilinogen, UA: 0.2 mg/dL (ref 0.0–1.0)
pH: 6 (ref 5.0–8.0)

## 2014-09-15 LAB — CBC WITH DIFFERENTIAL/PLATELET
Basophils Absolute: 0.1 10*3/uL (ref 0.0–0.1)
Basophils Relative: 1 % (ref 0–1)
EOS ABS: 0 10*3/uL (ref 0.0–0.7)
Eosinophils Relative: 0 % (ref 0–5)
HEMATOCRIT: 26.9 % — AB (ref 36.0–46.0)
Hemoglobin: 8.8 g/dL — ABNORMAL LOW (ref 12.0–15.0)
LYMPHS PCT: 30 % (ref 12–46)
Lymphs Abs: 2.6 10*3/uL (ref 0.7–4.0)
MCH: 31.9 pg (ref 26.0–34.0)
MCHC: 32.7 g/dL (ref 30.0–36.0)
MCV: 97.5 fL (ref 78.0–100.0)
Metamyelocytes Relative: 1 %
Monocytes Absolute: 0.4 10*3/uL (ref 0.1–1.0)
Monocytes Relative: 5 % (ref 3–12)
Myelocytes: 3 %
Neutro Abs: 5.7 10*3/uL (ref 1.7–7.7)
Neutrophils Relative %: 60 % (ref 43–77)
PLATELETS: 84 10*3/uL — AB (ref 150–400)
RBC: 2.76 MIL/uL — ABNORMAL LOW (ref 3.87–5.11)
RDW: 21.6 % — ABNORMAL HIGH (ref 11.5–15.5)
WBC: 8.8 10*3/uL (ref 4.0–10.5)

## 2014-09-15 LAB — COMPREHENSIVE METABOLIC PANEL
ALBUMIN: 3.4 g/dL — AB (ref 3.5–5.2)
ALT: 8 U/L (ref 0–35)
ANION GAP: 8 (ref 5–15)
AST: 15 U/L (ref 0–37)
Alkaline Phosphatase: 65 U/L (ref 39–117)
BUN: 16 mg/dL (ref 6–23)
CHLORIDE: 104 mmol/L (ref 96–112)
CO2: 25 mmol/L (ref 19–32)
Calcium: 8.2 mg/dL — ABNORMAL LOW (ref 8.4–10.5)
Creatinine, Ser: 0.61 mg/dL (ref 0.50–1.10)
GFR calc non Af Amer: 82 mL/min — ABNORMAL LOW (ref >90)
Glucose, Bld: 111 mg/dL — ABNORMAL HIGH (ref 70–99)
Potassium: 3.1 mmol/L — ABNORMAL LOW (ref 3.5–5.1)
SODIUM: 137 mmol/L (ref 135–145)
Total Bilirubin: 0.8 mg/dL (ref 0.3–1.2)
Total Protein: 6.9 g/dL (ref 6.0–8.3)

## 2014-09-15 MED ORDER — VITAMIN D 1000 UNITS PO TABS
2000.0000 [IU] | ORAL_TABLET | Freq: Every day | ORAL | Status: DC
  Administered 2014-09-16 – 2014-09-17 (×2): 2000 [IU] via ORAL
  Filled 2014-09-15 (×2): qty 2

## 2014-09-15 MED ORDER — CETYLPYRIDINIUM CHLORIDE 0.05 % MT LIQD
7.0000 mL | Freq: Two times a day (BID) | OROMUCOSAL | Status: DC
  Administered 2014-09-16 – 2014-09-17 (×3): 7 mL via OROMUCOSAL

## 2014-09-15 MED ORDER — CYCLOBENZAPRINE HCL 5 MG PO TABS
5.0000 mg | ORAL_TABLET | Freq: Three times a day (TID) | ORAL | Status: DC
  Administered 2014-09-15 – 2014-09-17 (×4): 5 mg via ORAL
  Filled 2014-09-15 (×7): qty 1

## 2014-09-15 MED ORDER — ENOXAPARIN SODIUM 40 MG/0.4ML ~~LOC~~ SOLN
40.0000 mg | SUBCUTANEOUS | Status: DC
  Administered 2014-09-15 – 2014-09-16 (×2): 40 mg via SUBCUTANEOUS
  Filled 2014-09-15 (×3): qty 0.4

## 2014-09-15 MED ORDER — SODIUM CHLORIDE 0.9 % IV SOLN
INTRAVENOUS | Status: DC
  Administered 2014-09-15: 18:00:00 via INTRAVENOUS

## 2014-09-15 MED ORDER — ALBUTEROL SULFATE (2.5 MG/3ML) 0.083% IN NEBU: 2.5000 mg | INHALATION_SOLUTION | RESPIRATORY_TRACT | Status: DC | PRN

## 2014-09-15 MED ORDER — ALBUTEROL SULFATE (2.5 MG/3ML) 0.083% IN NEBU
5.0000 mg | INHALATION_SOLUTION | Freq: Once | RESPIRATORY_TRACT | Status: AC
  Administered 2014-09-15: 5 mg via RESPIRATORY_TRACT
  Filled 2014-09-15: qty 6

## 2014-09-15 MED ORDER — SODIUM CHLORIDE 0.9 % IV BOLUS (SEPSIS)
500.0000 mL | Freq: Once | INTRAVENOUS | Status: AC
  Administered 2014-09-15: 500 mL via INTRAVENOUS

## 2014-09-15 MED ORDER — POTASSIUM CHLORIDE CRYS ER 20 MEQ PO TBCR
40.0000 meq | EXTENDED_RELEASE_TABLET | Freq: Once | ORAL | Status: AC
  Administered 2014-09-15: 40 meq via ORAL
  Filled 2014-09-15: qty 2

## 2014-09-15 MED ORDER — ACETAMINOPHEN 325 MG PO TABS: 650.0000 mg | ORAL_TABLET | Freq: Four times a day (QID) | ORAL | Status: DC | PRN

## 2014-09-15 MED ORDER — FLUOXETINE HCL 20 MG PO CAPS
30.0000 mg | ORAL_CAPSULE | Freq: Every morning | ORAL | Status: DC
  Administered 2014-09-16 – 2014-09-17 (×2): 30 mg via ORAL
  Filled 2014-09-15 (×2): qty 1

## 2014-09-15 MED ORDER — LISINOPRIL 10 MG PO TABS
10.0000 mg | ORAL_TABLET | Freq: Every day | ORAL | Status: DC
  Administered 2014-09-16 – 2014-09-17 (×2): 10 mg via ORAL
  Filled 2014-09-15 (×3): qty 1

## 2014-09-15 MED ORDER — MORPHINE SULFATE 4 MG/ML IJ SOLN
4.0000 mg | Freq: Once | INTRAMUSCULAR | Status: AC
Start: 2014-09-15 — End: 2014-09-15
  Administered 2014-09-15: 4 mg via INTRAVENOUS
  Filled 2014-09-15: qty 1

## 2014-09-15 MED ORDER — MORPHINE SULFATE 4 MG/ML IJ SOLN
4.0000 mg | INTRAMUSCULAR | Status: DC | PRN
  Administered 2014-09-15 – 2014-09-17 (×5): 4 mg via INTRAVENOUS
  Filled 2014-09-15 (×6): qty 1

## 2014-09-15 MED ORDER — LENALIDOMIDE 2.5 MG PO CAPS
2.5000 mg | ORAL_CAPSULE | Freq: Every day | ORAL | Status: DC
  Filled 2014-09-15 (×2): qty 1

## 2014-09-15 MED ORDER — BUTALBITAL-APAP-CAFFEINE 50-325-40 MG PO TABS: 1.0000 | ORAL_TABLET | Freq: Four times a day (QID) | ORAL | Status: DC | PRN

## 2014-09-15 MED ORDER — ONDANSETRON HCL 4 MG PO TABS: 4.0000 mg | ORAL_TABLET | Freq: Four times a day (QID) | ORAL | Status: DC | PRN

## 2014-09-15 MED ORDER — ONDANSETRON HCL 4 MG/2ML IJ SOLN
4.0000 mg | Freq: Four times a day (QID) | INTRAMUSCULAR | Status: DC | PRN
  Administered 2014-09-16: 4 mg via INTRAVENOUS
  Filled 2014-09-15: qty 2

## 2014-09-15 MED ORDER — HYDROCODONE-ACETAMINOPHEN 5-325 MG PO TABS
1.0000 | ORAL_TABLET | ORAL | Status: DC | PRN
  Administered 2014-09-16 (×2): 2 via ORAL
  Administered 2014-09-16 (×2): 1 via ORAL
  Administered 2014-09-17: 2 via ORAL
  Filled 2014-09-15: qty 2
  Filled 2014-09-15: qty 1
  Filled 2014-09-15 (×2): qty 2
  Filled 2014-09-15: qty 1
  Filled 2014-09-15: qty 2

## 2014-09-15 MED ORDER — ACETAMINOPHEN 650 MG RE SUPP: 650.0000 mg | Freq: Four times a day (QID) | RECTAL | Status: DC | PRN

## 2014-09-15 MED ORDER — CALCIUM CARBONATE ANTACID 500 MG PO CHEW: 1.0000 | CHEWABLE_TABLET | Freq: Every day | ORAL | Status: DC | PRN

## 2014-09-15 MED ORDER — CHLORHEXIDINE GLUCONATE 0.12 % MT SOLN
15.0000 mL | Freq: Two times a day (BID) | OROMUCOSAL | Status: DC
  Administered 2014-09-15 – 2014-09-17 (×4): 15 mL via OROMUCOSAL
  Filled 2014-09-15 (×6): qty 15

## 2014-09-15 MED ORDER — LORAZEPAM 0.5 MG PO TABS: 0.5000 mg | ORAL_TABLET | Freq: Three times a day (TID) | ORAL | Status: DC | PRN

## 2014-09-15 MED ORDER — IPRATROPIUM BROMIDE 0.02 % IN SOLN
0.5000 mg | Freq: Once | RESPIRATORY_TRACT | Status: AC
  Administered 2014-09-15: 0.5 mg via RESPIRATORY_TRACT
  Filled 2014-09-15: qty 2.5

## 2014-09-15 MED ORDER — METOPROLOL SUCCINATE ER 50 MG PO TB24
50.0000 mg | ORAL_TABLET | Freq: Every evening | ORAL | Status: DC
  Administered 2014-09-16: 50 mg via ORAL
  Filled 2014-09-15 (×3): qty 1

## 2014-09-15 MED ORDER — PREDNISONE 2.5 MG PO TABS
2.5000 mg | ORAL_TABLET | Freq: Every day | ORAL | Status: DC
  Administered 2014-09-16 – 2014-09-17 (×2): 2.5 mg via ORAL
  Filled 2014-09-15 (×4): qty 1

## 2014-09-15 NOTE — Progress Notes (Addendum)
  CARE MANAGEMENT ED NOTE 09/15/2014  Patient:  Tonya Mckee, Tonya Mckee   Account Number:  [id number]  Date Initiated:  09/15/2014  Documentation initiated by:  Jackelyn Poling  Subjective/Objective Assessment:   81 yr old [employer] medicare [place] pt from home with recent fall on 09/13/14. Pt has been evaluated here 09/13/14. Pt request to be seen for inability to bear weight; pain worsening. Bruising noted to right upper arm,     Subjective/Objective Assessment Detail:   right lower back, right hip, right thigh, and bilateral ankles.    Pt very HOH  pcp Carlos Levering  Pt and daughter stated pt has services [facility] since pt was started on Oxygen [place] "about nine months ago"  [facility] of [facility] health agency is [facility] if needed at d/c  Set up by the pcp Has [place], aide per daughter Has RW, 3N1, quad cane and shower chair  [facility] states at 1428 pt d/c from services 09/12/14 Pt with history of chronic anemia followed by Dr Alvy Bimler [facility]      Action/Plan:   [facility] ED CM consulted by Dr Algis Liming about admission status, disposition resources ED Cm spoke with ED SW about pt's daughter interest in possible facility choice offered   Action/Plan Detail:   ED CM notified [facility] of pt obs admission Voice message left 1423. Discussed observation admission, medicare guideline & possible out of pocket expense for facility placement with obs admission Dtr voiced understood   Anticipated DC Date:  09/16/2014     Status Recommendation to Physician:   Result of Recommendation:    Other ED Services  Consult Working Plan   In-house referral  [place]  Other  Outpatient Services - Pt will follow up   [place]   Choice offered to / List presented to:  C-1 Patient         [place].    Status of service:  Completed, signed off  ED Comments:   ED  Comments Detail:  CM reviewed in details medicare guidelines, home health ([place]) (length of stay in home, types of [facility] staff available, coverage, primary caregiver, up to 24 hrs before services may be started), Private duty nursing (PDN-coverage, length of stay in the home types of staff available),   CM reviewed availability of [place] SW to assist pcp to get pt to snf (if desired disposition) from the community level. CM provided family with a list of [place] home health agencies, Discussed pt to be further evaluated by unit therapists (PT/OT) for recommendation of level of care and share this with attending MD and unit CM

## 2014-09-15 NOTE — H&P (Addendum)
History and Physical  Tonya Mckee IDP:[id number] DOB: 1929-07-04 DOA: 09/15/2014  Referring physician: Dr. Veryl Speak, EDP PCP: [name]  Outpatient Specialists:  1. Pulmonology  Chief Complaint: Back pain post fall.  HPI: Tonya Mckee is a 81 y.o. 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 Patient lives alone. She ambulates with the help of a cane. On Saturday 1/29, she was getting off her high bed and tripped while putting on her shoes, fell on her back, landed on a couple of wood and steps that she has to get down from her bed, apparently hit the back of her head and transiently passed out for a couple of seconds. When she attempted to move, she experienced excruciating right lower back pain. She sat down for a couple of minutes and then was able to get up and called her daughter. She was brought to the ED where x-rays did not reveal a fracture and she was sent home on pain medications. Since then, she has continued to have severe 10/10 right lower back pain, worse with movements, worse with weightbearing, relieved slightly by pain medications, no radiation of pain, no sphincter disturbances, and difficult to carry out her daily activities at home especially without home support. Daughter thereby brought her back to the emergency department where CT of the back reveals L1-L3 right lateral transfer process fractures. Hospitalist admission requested for further evaluation and management.    Review of Systems: All systems reviewed and apart from history of presenting illness, are negative.  Past Medical History  Diagnosis Date  . COPD  (chronic obstructive pulmonary disease)   . Hypertension   . PONV (postoperative nausea and vomiting)   . Dysrhythmia     rapid heart rate  . Stroke 2010    mini stroke  . Pneumonia 2009  . Shortness of breath   . GERD (gastroesophageal reflux disease)     long time ago  . Depression   . DEMENTIA     borderline  . Depression 01/02/2012  . Anemia 01/02/2012  . Artery stenosis 10/30/12    Moderate to severe left extracranial vertebral  . Cancer 20years ago+ 1999    lt breast  left  . SCC (squamous cell carcinoma), leg     Right  LE Dr. [place] Crane  . Arthritis     Osteo-  Spine and Knees  . Vitamin B12 deficiency   . Vertigo, benign positional   . Memory loss   . Blepharospasm     Left eye  . Venous stasis ulcers 2012    Left LE due to trauma  . Cellulitis Jan 01, 2012    Left UE  . Carotid artery occlusion     Bruit- RIGHT  . Occlusion and stenosis of carotid artery without mention of cerebral infarction 11/06/2012  . Persistent headaches 02/26/2014  . Chronic respiratory failure     on NCO2    Past Surgical History  Procedure Laterality Date  . Abdominal hysterectomy    . Appendectomy    . Breast surgery    . Joint replacement      Lt arm/wrist plates and screws  . Eye surgery      L/R catarects  Social History:  reports that she has been smoking Cigarettes.  She has a 34 pack-year smoking history. She has never used smokeless tobacco. She reports that she does not drink alcohol or use illicit drugs. Widowed. Lives alone. Ambulates with the help of a cane. Smokes half a pack of cigarettes per day.  Allergies  Allergen Reactions  . Donepezil Nausea And Vomiting  . Penicillins Itching and Nausea And Vomiting  . Amoxapine And Related Nausea Only  . Doxycycline Rash  . Keflex [Cephalexin] Itching  . Bupropion Other (See Comments)    "Headaches on the XL."   . Ketek [Telithromycin] Itching    Dry Mouth  . Levaquin [Levofloxacin In D5w] Other (See Comments)    Insomnia   . Sulfa Antibiotics Nausea And Vomiting  . Tramadol Other (See Comments)    "Made her feel like a zombie."  . Zyrtec [Cetirizine] Other (See Comments)    "made her like a zombie"    Family History  Problem Relation Age of Onset  . Adopted: Yes   does not know details regarding family members.  Prior to Admission medications   Medication Sig Start Date End Date Taking? Authorizing Provider  Cholecalciferol (VITAMIN D) 2000 UNITS tablet Take 2,000 Units by mouth daily.   Yes Historical Provider, MD  FLUoxetine (PROZAC) 10 MG capsule Take 30 mg by mouth every morning.    Yes Historical Provider, MD  HYDROcodone-acetaminophen (NORCO/VICODIN) 5-325 MG per tablet Take 1-2 tablets by mouth every 4 (four) hours as needed for moderate pain or severe pain. 09/13/14  Yes Leota Jacobsen, MD  lenalidomide (REVLIMID) 2.5 MG capsule Take 1 capsule (2.5 mg total) by mouth daily. 09/08/14  Yes Heath Lark, MD  lisinopril (PRINIVIL,ZESTRIL) 10 MG tablet Take 10 mg by mouth daily.    Yes Historical Provider, MD  metoprolol succinate (TOPROL-XL) 50 MG 24 hr tablet Take 50 mg by mouth every evening. Take with or immediately following a meal.   Yes Historical Provider, MD  predniSONE (DELTASONE) 2.5 MG tablet Take 1 tablet (2.5 mg total) by mouth daily with breakfast. 09/02/14  Yes Heath Lark, MD  acetaminophen (TYLENOL) 325 MG tablet Take 650 mg by mouth every 6 (six) hours as needed for moderate pain (pain).     Historical Provider, MD  albuterol (PROVENTIL HFA;VENTOLIN HFA) 108 (90 BASE) MCG/ACT inhaler Inhale 2 puffs into the lungs every 6 (six) hours as needed for wheezing or shortness of breath (wheezing).     Historical Provider, MD  butalbital-acetaminophen-caffeine (FIORICET) 50-325-40 MG per tablet Take 1 tablet by mouth every 6 (six) hours as needed for headache. 09/02/14 09/02/15  Heath Lark, MD  calcium carbonate (TUMS - DOSED IN MG ELEMENTAL CALCIUM) 500 MG chewable tablet Chew 1 tablet by mouth daily  as needed for indigestion or heartburn.    Historical Provider, MD  fluticasone (FLONASE) 50 MCG/ACT nasal spray Place 1 spray into the nose daily as needed for allergies (allergies).     Historical Provider, MD  ibuprofen (ADVIL,MOTRIN) 200 MG tablet Take 400 mg by mouth every 6 (six) hours as needed for moderate pain (pain).     Historical Provider, MD  ipratropium-albuterol (DUONEB) 0.5-2.5 (3) MG/3ML SOLN Take 3 mLs by nebulization 2 (two) times daily as needed (wheezing).     Historical Provider, MD  LORazepam (ATIVAN) 0.5 MG tablet Take 0.5 mg by mouth every 8 (eight) hours as needed for anxiety (anxiety).     Historical Provider, MD  menthol-cetylpyridinium (CEPACOL) 3  MG lozenge Take 1 lozenge (3 mg total) by mouth as needed for sore throat. 08/01/14   Nishant Dhungel, MD   Physical Exam: Filed Vitals:   09/15/14 1137 09/15/14 1138 09/15/14 1328 09/15/14 1416  BP: 165/64  163/87 161/69  Pulse: 74  88 85  Temp:      TempSrc:      Resp: 18  18 18   SpO2: 89% 94% 95% 97%     General exam: Moderately built and thinly nourished elderly frail female patient, lying comfortably supine on the gurney in no obvious distress.  Head, eyes and ENT: Nontraumatic and normocephalic. Pupils equally reacting to light and accommodation. Oral mucosa moist.  Neck: Supple. No JVD, carotid bruit or thyromegaly.  Lymphatics: No lymphadenopathy.  Respiratory system: Clear to auscultation. No increased work of breathing.  Cardiovascular system: S1 and S2 heard, RRR. No JVD, murmurs, gallops, clicks or pedal edema.  Gastrointestinal system: Abdomen is nondistended, soft and nontender. Normal bowel sounds heard. No organomegaly or masses appreciated.  Central nervous system: Alert and oriented. No focal neurological deficits.  Extremities: Symmetric 5 x 5 power. Peripheral pulses symmetrically felt.   Skin: Several bruises noted over body including the inferior medial aspect of right upper arm, right  lower paraspinal region, right hip, right mid lateral thigh and bilateral shins.  Musculoskeletal system: Negative exam. Tenderness and muscle spasm of right lumbar spine paraspinal region with superficial bruising and? Hematoma.  Psychiatry: Pleasant and cooperative.   Labs on Admission:  Basic Metabolic Panel:  Recent Labs Lab 09/15/14 1331  NA 137  K 3.1*  CL 104  CO2 25  GLUCOSE 111*  BUN 16  CREATININE 0.61  CALCIUM 8.2*   Liver Function Tests:  Recent Labs Lab 09/15/14 1331  AST 15  ALT 8  ALKPHOS 65  BILITOT 0.8  PROT 6.9  ALBUMIN 3.4*   No results for input(s): LIPASE, AMYLASE in the last 168 hours. No results for input(s): AMMONIA in the last 168 hours. CBC:  Recent Labs Lab 09/15/14 1331  WBC 8.8  NEUTROABS PENDING  HGB 8.8*  HCT 26.9*  MCV 97.5  PLT PENDING   Cardiac Enzymes: No results for input(s): CKTOTAL, CKMB, CKMBINDEX, TROPONINI in the last 168 hours.  BNP (last 3 results) No results for input(s): PROBNP in the last 8760 hours. CBG: No results for input(s): GLUCAP in the last 168 hours.  Radiological Exams on Admission: Dg Ribs Unilateral W/chest Right  09/15/2014   CLINICAL DATA:  Initial encounter for fall Saturday with right-sided pain. Shortness of breath and nausea. Ex-smoker.  EXAM: RIGHT RIBS AND CHEST - 3+ VIEW  COMPARISON:  07/30/2014 and CT of 12/19/2013.  FINDINGS: Frontal view of the chest and three views of right-sided ribs. Frontal view of the chest demonstrates left axillary surgical clips. Midline trachea. Mild cardiomegaly with a tortuous atherosclerotic aorta. No pleural effusion or pneumothorax. Interstitial thickening is upper lobe predominant and likely related chronic bronchitis/ smoking. No lobar consolidation.  Three views of right-sided ribs demonstrate no displaced rib fracture  IMPRESSION: No displaced rib fracture, pleural fluid, or pneumothorax.  Cardiomegaly with chronic interstitial thickening, likely related to  the clinical history of smoking.   Electronically Signed   By: Abigail Miyamoto M.D.   On: 09/15/2014 12:23   Dg Lumbar Spine Complete  09/13/2014   CLINICAL DATA:  Status post fall out of bed today.  Back pain.  EXAM: LUMBAR SPINE - COMPLETE 4+ VIEW  COMPARISON:  CT abdomen and  pelvis 12/19/2013.  FINDINGS: Convex right scoliosis is again seen. There is no fracture. Alignment is normal. Facet degenerative disease is most notable at L4-5 and L5-S1.  IMPRESSION: No acute abnormality.   Electronically Signed   By: Inge Rise M.D.   On: 09/13/2014 17:25   Dg Pelvis 1-2 Views  09/15/2014   CLINICAL DATA:  Fall on Saturday with right lower back pain.  EXAM: PELVIS - 1-2 VIEW  COMPARISON:  None.  FINDINGS: There is no evidence of pelvic fracture or diastasis. The hip joints show normal alignment. No pelvic bone lesions are seen. Soft tissues are unremarkable.  IMPRESSION: Normal bony pelvis.   Electronically Signed   By: Aletta Edouard M.D.   On: 09/15/2014 12:22   Ct Lumbar Spine Wo Contrast  09/15/2014   CLINICAL DATA:  Fall 2 days ago with low back pain. Recently negative radiographs.  EXAM: CT LUMBAR SPINE WITHOUT CONTRAST  TECHNIQUE: Multidetector CT imaging of the lumbar spine was performed without intravenous contrast administration. Multiplanar CT image reconstructions were also generated.  COMPARISON:  None.  FINDINGS: Acute, nondisplaced fractures of the L1, L2, and L3 right transverse processes. No vertebral body fracture or subluxation.  Diffuse degenerative disc narrowing, most notable at L4-5 where there is also chronic right lateral translation of the L4 vertebra. No evidence of acute disc herniation/stenosis.  IMPRESSION: L1 through L3 right transverse process fractures.   Electronically Signed   By: Jorje Guild M.D.   On: 09/15/2014 11:47    EKG: None in Epic for today.  Assessment/Plan Principal Problem:   Fracture of lumbar spine L 1-3 transverse process Active Problems:    Hypertension   COPD (chronic obstructive pulmonary disease)   MDS (myelodysplastic syndrome) with 5q deletion   Tobacco abuse   Anemia in neoplastic disease   Chronic respiratory failure   Fall   1. Intractable back pain secondary to L1-L3 right transverse process fractures sustained post mechanical fall: Admit to medical floor. Discussed with neurosurgeon on call who recommends that no surgical intervention is indicated. Recommends symptomatic/supportive treatment and a lumbar corset. PT and OT evaluation. Will get CT head due to h/o hitting head to R/O acute pathology despite h/o chronic headaches. 2. Hypokalemia: Replace and follow BMP. 3. COPD-oxygen and steroid dependent: Stable. Continue steroids, oxygen and bronchodilators when necessary. 4. Chronic hypoxic respiratory failure: Continue oxygen. 5. Myelodysplastic syndrome with 5 q deletion: OP follow up with Oncology 6. Essential hypertension: Mildly uncontrolled. Likely secondary to ongoing pain. Continue metoprolol and lisinopril. 7. Anemia and thrombocytopenia secondary to MDS: Hemoglobin stable. Platelet count pending. Follow CBC knee.  8. Tobacco abuse: Cessation counseled. Patient declines nicotine patch. 9. History of anxiety and depression: Continue home meds.      Code Status: Full  Family Communication: Discussed with daughter Dawn at bedside  Disposition Plan: To be determined   Time spent: 65 minutes.  Vernell Leep, MD, FACP, FHM. [facility] Pager [phone]  If 7PM-7AM, please contact night-coverage [email or web address] Password [email or web address] 09/15/2014, 2:52 PM

## 2014-09-15 NOTE — Progress Notes (Signed)
Choice offered  HOME HEALTH AGENCIES SERVING [place]   Agencies that are Medicare-Certified and are affiliated with [place]  [facility] Agency  Telephone Number Address  [facility]   The [place] [facility] has ownership interest in this company; however, you are under no obligation to use this agency. [phone] or  [phone] [address] [place], [place] [address] [email or web address]   Agencies that are Medicare-Certified and are not affiliated with [place]                                                                                  Home Health Agency Telephone Number Address  [facility] [phone] Fax [phone] [address], [address] [place], [place]  [address] [email or web address]  [facility] [phone] Fax [phone] [address]   [address] [place], [place]  [address] [email or web address]  [facility] [phone] Fax [phone] [address] [place], [place] [address] [email or web address]  [facility] [phone] Fax [phone] [address], [address] [place], [place]  [address] [email or web address]  [facility] [phone] Fax [phone] [address], [address] [place], [place] [address] [email or web address]  [facility] [phone] [address] [place], [place] [address] [email or web address]  [facility] [phone]  [address] [place], [place] [address] [email or web address]  [facility] [phone] or [phone] Fax number [phone] [address], [address] [place], [place]  [address] [email or web address]  [facility] [phone] Fax [phone] [address] , [place]  [address]  [facility]   [phone] Fax [phone] [address] [place],  [address] [email or web address]   

## 2014-09-15 NOTE — ED Notes (Signed)
Bed: [id number] Expected date:  Expected time:  Means of arrival:  Comments: EMS-back pain

## 2014-09-15 NOTE — ED Notes (Signed)
Per daughter mother/pt on 2 lpm [place] continuously.

## 2014-09-15 NOTE — Progress Notes (Addendum)
Clinical Social Work Department CLINICAL SOCIAL WORK PLACEMENT NOTE 09/15/2014  Patient:  DELAINIE, CHAVANA  Account Number:  [id number] [place] date:  09/15/2014  Clinical Social Worker:  Loletta Specter  Date/time:  09/15/2014 03:00 PM  Clinical Social Work is seeking post-discharge placement for this patient at the following level of care:   [place]   (*CSW will update this form in Epic as items are completed)   09/15/2014  Patient/family provided with [facility] Department of Clinical Social Work's list of facilities offering this level of care within the geographic area requested by the patient (or if unable, by the patient's family).  09/15/2014  Patient/family informed of their freedom to choose among providers that offer the needed level of care, that participate in Medicare, Medicaid or managed care program needed by the patient, have an available bed and are willing to accept the patient.  09/15/2014  Patient/family informed of [facility]' ownership interest in [facility], as well as of the fact that they are under no obligation to receive care at this facility.  PASARR submitted to EDS on 09/17/14 PASARR number received on 09/17/14  FL2 transmitted to all facilities in geographic area requested by pt/family on  09/15/14 FL2 transmitted to all facilities within larger geographic area on   Patient informed that his/her managed care company has contracts with or will negotiate with  certain facilities, including the following:     Patient/family informed of bed offers received: 09/16/14  Patient chooses bed at [facility] Physician recommends and patient chooses bed at    Patient to be transferred to [facility] on  09/17/14 Patient to be transferred to facility by [facility] Patient and family notified of transfer on 09/17/14 Name of family member notified:  Dtr-Dawn  The following physician request were entered in Epic:   Additional Comments:  Noreene Larsson [phone]  ED CSW 09/15/2014 3:21 PM

## 2014-09-15 NOTE — ED Notes (Signed)
Pt in DG at present time; will administer pain medication with pt return.

## 2014-09-15 NOTE — ED Notes (Signed)
Per [facility] pt from home with recent fall on Saturday. Pt has been evaluated here Saturday. Pt request to be seen for inability to bear weight; pain worsening.

## 2014-09-15 NOTE — ED Notes (Signed)
Bruising noted to right upper arm, right lower back, right hip, right thigh, and bilateral ankles.

## 2014-09-15 NOTE — ED Notes (Signed)
Dawn/daughter [phone]

## 2014-09-15 NOTE — ED Provider Notes (Signed)
CSN: [id number]     Arrival date & time 09/15/14  1037 History   First MD Initiated Contact with Patient 09/15/14 1038     Chief Complaint  Patient presents with  . Back Pain     (Consider location/radiation/quality/duration/timing/severity/associated sxs/prior Treatment) HPI Comments: Patient is an 81 year old female with past medical history of COPD, hypertension, myelodysplasia. She presents with complaints of severe pain in her low back and right flank. She sustained a fall 2 days ago and was evaluated here. She had x-rays of lumbar region which were unremarkable and she was discharged to home. Since returning home she has had increased pain and significant difficulty with ambulation. She denies any bowel or bladder complaints. She denies any radiation into her legs.  Patient is a 81 y.o. female presenting with back pain. The history is provided by the patient.  Back Pain Location:  Lumbar spine Quality:  Stabbing Radiates to:  Does not radiate Pain severity:  Severe Pain is:  Same all the time Onset quality:  Sudden Duration:  2 days Timing:  Constant Progression:  Worsening Chronicity:  New Context: falling   Relieved by:  Nothing Worsened by:  Ambulation, bending, palpation and movement Ineffective treatments:  Narcotics Associated symptoms: no bladder incontinence, no bowel incontinence, no numbness, no tingling and no weakness     Past Medical History  Diagnosis Date  . COPD (chronic obstructive pulmonary disease)   . Hypertension   . PONV (postoperative nausea and vomiting)   . Dysrhythmia     rapid heart rate  . Stroke 2010    mini stroke  . Pneumonia 2009  . Shortness of breath   . GERD (gastroesophageal reflux disease)     long time ago  . Depression   . DEMENTIA     borderline  . Depression 01/02/2012  . Anemia 01/02/2012  . Artery stenosis 10/30/12    Moderate to severe left extracranial vertebral  . Cancer 20years ago+ 1999    lt breast  left  . SCC  (squamous cell carcinoma), leg     Right  LE Dr. [place] Crane  . Arthritis     Osteo-  Spine and Knees  . Vitamin B12 deficiency   . Vertigo, benign positional   . Memory loss   . Blepharospasm     Left eye  . Venous stasis ulcers 2012    Left LE due to trauma  . Cellulitis Jan 01, 2012    Left UE  . Carotid artery occlusion     Bruit- RIGHT  . Occlusion and stenosis of carotid artery without mention of cerebral infarction 11/06/2012  . Persistent headaches 02/26/2014  . Chronic respiratory failure     on NCO2    Past Surgical History  Procedure Laterality Date  . Abdominal hysterectomy    . Appendectomy    . Breast surgery    . Joint replacement      Lt arm/wrist plates and screws  . Eye surgery      L/R catarects   Family History  Problem Relation Age of Onset  . Adopted: Yes   History  Substance Use Topics  . Smoking status: Former Smoker -- 1.00 packs/day for 68 years    Types: Cigarettes  . Smokeless tobacco: Never Used  . Alcohol Use: No   OB History    No data available     Review of Systems  Gastrointestinal: Negative for bowel incontinence.  Genitourinary: Negative for bladder incontinence.  Musculoskeletal: Positive for back  pain.  Neurological: Negative for tingling, weakness and numbness.  All other systems reviewed and are negative.     Allergies  Donepezil; Penicillins; Amoxapine and related; Doxycycline; Keflex; Bupropion; Ketek; Levaquin; Sulfa antibiotics; Tramadol; and Zyrtec  Home Medications   Prior to Admission medications   Medication Sig Start Date End Date Taking? Authorizing Provider  acetaminophen (TYLENOL) 325 MG tablet Take 650 mg by mouth every 6 (six) hours as needed for moderate pain (pain).     Historical Provider, MD  albuterol (PROVENTIL HFA;VENTOLIN HFA) 108 (90 BASE) MCG/ACT inhaler Inhale 2 puffs into the lungs every 6 (six) hours as needed for wheezing or shortness of breath (wheezing).     Historical Provider, MD   butalbital-acetaminophen-caffeine (FIORICET) 50-325-40 MG per tablet Take 1 tablet by mouth every 6 (six) hours as needed for headache. 09/02/14 09/02/15  Heath Lark, MD  calcium carbonate (TUMS - DOSED IN MG ELEMENTAL CALCIUM) 500 MG chewable tablet Chew 1 tablet by mouth daily as needed for indigestion or heartburn.    Historical Provider, MD  Cholecalciferol (VITAMIN D) 2000 UNITS tablet Take 2,000 Units by mouth daily.    Historical Provider, MD  FLUoxetine (PROZAC) 10 MG capsule Take 30 mg by mouth every morning.     Historical Provider, MD  fluticasone (FLONASE) 50 MCG/ACT nasal spray Place 1 spray into the nose daily as needed for allergies (allergies).     Historical Provider, MD  HYDROcodone-acetaminophen (NORCO/VICODIN) 5-325 MG per tablet Take 1-2 tablets by mouth every 4 (four) hours as needed for moderate pain or severe pain. 09/13/14   Leota Jacobsen, MD  ibuprofen (ADVIL,MOTRIN) 200 MG tablet Take 400 mg by mouth every 6 (six) hours as needed for moderate pain (pain).     Historical Provider, MD  ipratropium-albuterol (DUONEB) 0.5-2.5 (3) MG/3ML SOLN Take 3 mLs by nebulization 2 (two) times daily as needed (wheezing).     Historical Provider, MD  lenalidomide (REVLIMID) 2.5 MG capsule Take 1 capsule (2.5 mg total) by mouth daily. 09/08/14   Heath Lark, MD  lisinopril (PRINIVIL,ZESTRIL) 10 MG tablet Take 10 mg by mouth daily.     Historical Provider, MD  LORazepam (ATIVAN) 0.5 MG tablet Take 0.5 mg by mouth every 8 (eight) hours as needed for anxiety (anxiety).     Historical Provider, MD  menthol-cetylpyridinium (CEPACOL) 3 MG lozenge Take 1 lozenge (3 mg total) by mouth as needed for sore throat. 08/01/14   Nishant Dhungel, MD  metoprolol succinate (TOPROL-XL) 50 MG 24 hr tablet Take 50 mg by mouth every evening. Take with or immediately following a meal.    Historical Provider, MD  predniSONE (DELTASONE) 2.5 MG tablet Take 1 tablet (2.5 mg total) by mouth daily with breakfast. 09/02/14    Ni Gorsuch, MD   BP 180/76 mmHg  Pulse 60  Temp(Src) 97.9 F (36.6 C) (Oral)  Resp 15  SpO2 91% Physical Exam  Constitutional: She is oriented to person, place, and time. She appears well-developed and well-nourished. No distress.  Patient is an 81 year old female in no acute distress. She does appear uncomfortable.  HENT:  Head: Normocephalic and atraumatic.  Neck: Normal range of motion. Neck supple.  Cardiovascular: Normal rate and regular rhythm.  Exam reveals no gallop and no friction rub.   No murmur heard. Pulmonary/Chest: Effort normal and breath sounds normal. No respiratory distress. She has no wheezes.  Abdominal: Soft. Bowel sounds are normal. She exhibits no distension. There is no tenderness.  Musculoskeletal: Normal range of  motion.  There is a hematoma noted to the right lower lumbar region. There is exquisite tenderness over the posterior right ribs and lower back.  Pelvis appears stable.  PMS is intact to both legs.  Neurological: She is alert and oriented to person, place, and time.  Skin: Skin is warm and dry. She is not diaphoretic.  Nursing note and vitals reviewed.   ED Course  Procedures (including critical care time) Labs Review Labs Reviewed - No data to display  Imaging Review Dg Lumbar Spine Complete  09/13/2014   CLINICAL DATA:  Status post fall out of bed today.  Back pain.  EXAM: LUMBAR SPINE - COMPLETE 4+ VIEW  COMPARISON:  CT abdomen and pelvis 12/19/2013.  FINDINGS: Convex right scoliosis is again seen. There is no fracture. Alignment is normal. Facet degenerative disease is most notable at L4-5 and L5-S1.  IMPRESSION: No acute abnormality.   Electronically Signed   By: Inge Rise M.D.   On: 09/13/2014 17:25     EKG Interpretation None      MDM   Final diagnoses:  Fall    Patient returns 48 hours after being evaluated for a fall. She had plain films performed of the lumbar spine which were unremarkable. Since returning home she  has been unable to ambulate and is having worsening pain. Further images of the ribs and pelvis were obtained which were negative, however CT of the lumbar spine does show transverse process fractures of L1-L3. Patient has been given 8 mg of morphine, however still is in considerable discomfort. She lives alone and I am not optimistic she will function well. I feel as though she is already a fall risk and will be worsened while taking pain medication. For this reason, I have consulted Dr. Algis Liming from the hospitalist service who will admit.    Veryl Speak, MD 09/15/14 [phone]

## 2014-09-15 NOTE — Progress Notes (Signed)
Clinical Social Work Department BRIEF PSYCHOSOCIAL ASSESSMENT 09/15/2014  Patient:  Tonya Mckee, Tonya Mckee     Account Number:  [id number]     [place] date:  09/15/2014  Clinical Social Worker:  Graciana Sessa Inez Catalina  Date/Time:  09/15/2014 02:30 PM  Referred by:  RN  Date Referred:  09/15/2014 Referred for  SNF Placement   Other Referral:   Interview type:  Patient Other interview type:    PSYCHOSOCIAL DATA Living Status:  ALONE Admitted from facility:   Level of care:   Primary support name:  Tonya Mckee Primary support relationship to patient:  CHILD, ADULT Degree of support available:   strong    CURRENT CONCERNS Current Concerns  Post-Acute Placement   Other Concerns:    SOCIAL WORK ASSESSMENT / PLAN CSW met with pt and pt daughter at bedside. CSW introduced self and csw role. Patient gave permission to complete psychosocial with daughter present. Patient shared she lives at home alone and recieves home health services [facility]. Pt complaining of lower back pain and being admitted for pain management. Pt and pt daughter have concerns about patient returning home alone and inquiring about skilled nursing placement.    CSW, pt, and pt daugher discussed placement options. Patent interested in [facility] for placement. Pt states she is familiar with [facility] and [facility] from friends that have stayed for short term rehab but open to other options as well.    Patient to placed in short term rehab once medically stable pending insurance authorziation. If patient is not elligible for insurance covering for short term rehab, patient prefers to return home with  home health services.   Assessment/plan status:  Psychosocial Support/Ongoing Assessment of Needs Other assessment/ plan:   Information/referral to community resources:   skilled nursing facility list    PATIENT'S/FAMILY'S RESPONSE TO PLAN OF CARE: Patient and patient daughter thanked  csw for concern and support. Patient hopes to go to short term rehab once medically stable if elligible.       Noreene Larsson [phone]  ED CSW 09/15/2014 3:19 PM

## 2014-09-16 ENCOUNTER — Other Ambulatory Visit: Payer: Medicare Other

## 2014-09-16 DIAGNOSIS — D6959 Other secondary thrombocytopenia: Secondary | ICD-10-CM

## 2014-09-16 DIAGNOSIS — K5909 Other constipation: Secondary | ICD-10-CM

## 2014-09-16 DIAGNOSIS — S32009D Unspecified fracture of unspecified lumbar vertebra, subsequent encounter for fracture with routine healing: Secondary | ICD-10-CM

## 2014-09-16 DIAGNOSIS — S32029A Unspecified fracture of second lumbar vertebra, initial encounter for closed fracture: Secondary | ICD-10-CM

## 2014-09-16 DIAGNOSIS — R51 Headache: Secondary | ICD-10-CM

## 2014-09-16 DIAGNOSIS — Z720 Tobacco use: Secondary | ICD-10-CM

## 2014-09-16 DIAGNOSIS — D46C Myelodysplastic syndrome with isolated del(5q) chromosomal abnormality: Secondary | ICD-10-CM

## 2014-09-16 DIAGNOSIS — J449 Chronic obstructive pulmonary disease, unspecified: Secondary | ICD-10-CM

## 2014-09-16 DIAGNOSIS — D630 Anemia in neoplastic disease: Secondary | ICD-10-CM

## 2014-09-16 DIAGNOSIS — S32019A Unspecified fracture of first lumbar vertebra, initial encounter for closed fracture: Secondary | ICD-10-CM

## 2014-09-16 DIAGNOSIS — S32039A Unspecified fracture of third lumbar vertebra, initial encounter for closed fracture: Secondary | ICD-10-CM

## 2014-09-16 LAB — CBC
HCT: 25 % — ABNORMAL LOW (ref 36.0–46.0)
Hemoglobin: 8.1 g/dL — ABNORMAL LOW (ref 12.0–15.0)
MCH: 31.8 pg (ref 26.0–34.0)
MCHC: 32.4 g/dL (ref 30.0–36.0)
MCV: 98 fL (ref 78.0–100.0)
Platelets: 80 10*3/uL — ABNORMAL LOW (ref 150–400)
RBC: 2.55 MIL/uL — AB (ref 3.87–5.11)
RDW: 22 % — ABNORMAL HIGH (ref 11.5–15.5)
WBC: 8.6 10*3/uL (ref 4.0–10.5)

## 2014-09-16 LAB — BASIC METABOLIC PANEL
Anion gap: 9 (ref 5–15)
BUN: 11 mg/dL (ref 6–23)
CALCIUM: 7.8 mg/dL — AB (ref 8.4–10.5)
CHLORIDE: 102 mmol/L (ref 96–112)
CO2: 25 mmol/L (ref 19–32)
Creatinine, Ser: 0.62 mg/dL (ref 0.50–1.10)
GFR calc Af Amer: >90 mL/min (ref >90)
GFR calc non Af Amer: 81 mL/min — ABNORMAL LOW (ref >90)
Glucose, Bld: 103 mg/dL — ABNORMAL HIGH (ref 70–99)
POTASSIUM: 3.7 mmol/L (ref 3.5–5.1)
Sodium: 136 mmol/L (ref 135–145)

## 2014-09-16 LAB — PREPARE RBC (CROSSMATCH)

## 2014-09-16 MED ORDER — SODIUM CHLORIDE 0.9 % IV SOLN
Freq: Once | INTRAVENOUS | Status: AC
  Administered 2014-09-16: 13:00:00 via INTRAVENOUS

## 2014-09-16 MED ORDER — SENNOSIDES-DOCUSATE SODIUM 8.6-50 MG PO TABS
1.0000 | ORAL_TABLET | Freq: Two times a day (BID) | ORAL | Status: DC
Start: 2014-09-16 — End: 2014-09-17
  Administered 2014-09-16 – 2014-09-17 (×3): 1 via ORAL
  Filled 2014-09-16 (×4): qty 1

## 2014-09-16 MED ORDER — POLYETHYLENE GLYCOL 3350 17 G PO PACK
17.0000 g | PACK | Freq: Every day | ORAL | Status: DC
  Administered 2014-09-16 – 2014-09-17 (×2): 17 g via ORAL
  Filled 2014-09-16 (×2): qty 1

## 2014-09-16 NOTE — Progress Notes (Signed)
Tonya Mckee   DOB:[birth year]   TF#:[id number]   YHC#:[id number]  Patient Care Team: Carlos Levering, PA-C as PCP - General (Family Medicine) Lennon Alstrom, MD (Neurology) Carlos Levering, PA-C as Referring Physician (Family Medicine) Heath Lark, MD as Consulting Physician (Hematology and Oncology)  I have seen the patient, examined her and edited the notes as follows  Subjective: Ms Tonya Mckee is a 81 year old woman with a history of  myelodysplastic syndrome with deletion 5Q, currently on Revlimid and Prednisone taper, admitted on 2/1 after initially presenting to the Emergency Department on 1/30 with low back pain after sustaining a mechanical fall. She suffered trauma to the head and short loss of consciousness  (2-3 seconds). She denied any seizures, headaches or vision changes. No confusion was reported. She denied any shortness of breath, chest pain or palpitations. She denied any nausea, vertigo, vomiting. Denies any dysuria. Denies abnormal skin rashes, or neuropathy. Denies any bleeding issues such as epistaxis, hematemesis, hematuria or hematochezia. While initially an  x ray of the lumbar region on 1/30 was negative for fracture (and discharged home with pain medications) the back pain was localized, non-radiating, severe and worse with weight bearing. A CT of the lumbar spine on 2/1 revealed L1-L3 right lateral process fractures. A CT of the head on 2/1 was negative for acute findings. Admitting team discussed with Neurosurgery, who recommends conservative treatment as no surgical intervention is indicated. We have been kindly informed of the patient's admission. She complained of fatigue, shortness of breath and constipation since Saturday.  SUMMARY OF ONCOLOGIC HISTORY:  She was found to have abnormal CBC from routine blood work. Starting around 2012, she has progressive anemia with hemoglobin and the lowest at 9.1. The patient was prescribed oral iron supplements and she takes them  regularly.  She has remote history of left breast cancer detected from mammogram. According to the patient, she had lumpectomy followed by radiation followed by tamoxifen. She does not know the stage of disease but she does not think the lymph nodes were involved.  On 12/27/13: Bone marrow biopsy did not show evidence of leukemia or metastatic cancer. Cytogenetics and FISH analysis confirm myelodysplastic syndrome with deletion 5Q. The patient was started on erythropoietin stimulating agent to keep hemoglobin greater than 10 g. On 04/15/14: CT head was negative On 04/24/2014, we discontinued darbepoetin as the patient complained of side effects. She was started on prednisone 10 mg daily. On 05/22/14: Prednisone is tapered to 5 mg. On 06/16/2014, she started on Revlimid 2.5 mg daily.  Scheduled Meds: . antiseptic oral rinse  7 mL Mouth Rinse q12n4p  . chlorhexidine  15 mL Mouth Rinse BID  . cholecalciferol  2,000 Units Oral Daily  . cyclobenzaprine  5 mg Oral TID  . enoxaparin (LOVENOX) injection  40 mg Subcutaneous Q24H  . FLUoxetine  30 mg Oral q morning - 10a  . lenalidomide  2.5 mg Oral Daily  . lisinopril  10 mg Oral Daily  . metoprolol succinate  50 mg Oral QPM  . predniSONE  2.5 mg Oral Q breakfast   Continuous Infusions: . sodium chloride 50 mL/hr at 09/15/14 1744   PRN Meds:acetaminophen **OR** acetaminophen, albuterol, butalbital-acetaminophen-caffeine, calcium carbonate, HYDROcodone-acetaminophen, LORazepam, morphine injection, ondansetron **OR** ondansetron (ZOFRAN) IV   Objective:  Filed Vitals:   09/16/14 0459  BP: 163/64  Pulse: 90  Temp: 100 F (37.8 C)  Resp: 20     No intake or output data in the 24 hours ending 09/16/14 5176  ECOG PERFORMANCE STATUS: 3-4 due to fracture   GENERAL:alert, no distress and comfortable SKIN: skin color, texture, turgor are normal, no rashes or significant lesions. Bruises noted on the right lower back at the trauma region, right arm  and right leg. EYES: normal, conjunctiva are pink and non-injected, sclera clear OROPHARYNX:no exudate, no erythema and lips, buccal mucosa, and tongue normal  NECK: supple, thyroid normal size, non-tender, without nodularity LYMPH:  no palpable lymphadenopathy in the cervical, axillary or inguinal LUNGS: clear to auscultation and percussion with normal breathing effort HEART: regular rate & rhythm and no murmurs and no lower extremity edema ABDOMEN: abdomen soft, non-tender and normal bowel sounds Musculoskeletal:no cyanosis of digits and no clubbing  PSYCH: alert & oriented x 3 with fluent speech NEURO: no focal motor/sensory deficits    CBG (last 3)  No results for input(s): GLUCAP in the last 72 hours.   Labs:   Recent Labs Lab 09/15/14 1331 09/16/14 0500  WBC 8.8 8.6  HGB 8.8* 8.1*  HCT 26.9* 25.0*  PLT 84* 80*  MCV 97.5 98.0  MCH 31.9 31.8  MCHC 32.7 32.4  RDW 21.6* 22.0*  LYMPHSABS 2.6  --   MONOABS 0.4  --   EOSABS 0.0  --   BASOSABS 0.1  --      Chemistries:    Recent Labs Lab 09/15/14 1331 09/16/14 0500  NA 137 136  K 3.1* 3.7  CL 104 102  CO2 25 25  GLUCOSE 111* 103*  BUN 16 11  CREATININE 0.61 0.62  CALCIUM 8.2* 7.8*  AST 15  --   ALT 8  --   ALKPHOS 65  --   BILITOT 0.8  --     GFR Estimated Creatinine Clearance: 42.1 mL/min (by C-G formula based on Cr of 0.62).  Liver Function Tests:  Recent Labs Lab 09/15/14 1331  AST 15  ALT 8  ALKPHOS 65  BILITOT 0.8  PROT 6.9  ALBUMIN 3.4*    Urine Studies     Component Value Date/Time   COLORURINE YELLOW 09/15/2014 1325   APPEARANCEUR CLEAR 09/15/2014 1325   LABSPEC 1.017 09/15/2014 1325   PHURINE 6.0 09/15/2014 1325   GLUCOSEU NEGATIVE 09/15/2014 1325   HGBUR NEGATIVE 09/15/2014 1325   BILIRUBINUR NEGATIVE 09/15/2014 1325   KETONESUR 15* 09/15/2014 1325   PROTEINUR NEGATIVE 09/15/2014 1325   UROBILINOGEN 0.2 09/15/2014 1325   NITRITE NEGATIVE 09/15/2014 1325   LEUKOCYTESUR  NEGATIVE 09/15/2014 1325       Imaging Studies:  Dg Ribs Unilateral W/chest Right  09/15/2014   CLINICAL DATA:  Initial encounter for fall Saturday with right-sided pain. Shortness of breath and nausea. Ex-smoker.  EXAM: RIGHT RIBS AND CHEST - 3+ VIEW  COMPARISON:  07/30/2014 and CT of 12/19/2013.  FINDINGS: Frontal view of the chest and three views of right-sided ribs. Frontal view of the chest demonstrates left axillary surgical clips. Midline trachea. Mild cardiomegaly with a tortuous atherosclerotic aorta. No pleural effusion or pneumothorax. Interstitial thickening is upper lobe predominant and likely related chronic bronchitis/ smoking. No lobar consolidation.  Three views of right-sided ribs demonstrate no displaced rib fracture  IMPRESSION: No displaced rib fracture, pleural fluid, or pneumothorax.  Cardiomegaly with chronic interstitial thickening, likely related to the clinical history of smoking.   Electronically Signed   By: Abigail Miyamoto M.D.   On: 09/15/2014 12:23   Dg Pelvis 1-2 Views  09/15/2014   CLINICAL DATA:  Fall on Saturday with right lower back pain.  EXAM:  PELVIS - 1-2 VIEW  COMPARISON:  None.  FINDINGS: There is no evidence of pelvic fracture or diastasis. The hip joints show normal alignment. No pelvic bone lesions are seen. Soft tissues are unremarkable.  IMPRESSION: Normal bony pelvis.   Electronically Signed   By: Aletta Edouard M.D.   On: 09/15/2014 12:22   Ct Head Wo Contrast  09/15/2014   CLINICAL DATA:  Persistent headaches. Fall 2 days ago, injuring the back. Struck the back of the head with transient loss of consciousness.  EXAM: CT HEAD WITHOUT CONTRAST  TECHNIQUE: Contiguous axial images were obtained from the base of the skull through the vertex without intravenous contrast.  COMPARISON:  04/15/2014.  FINDINGS: Small lacunar infarcts are suspected in the right lentiform nucleus and anterior limb right internal capsule, and posterior limb right internal capsule.  Periventricular white matter and corona radiata hypodensities favor chronic ischemic microvascular white matter disease.  Otherwise, the brainstem, cerebellum, cerebral peduncles, thalamus, basal ganglia, basilar cisterns, and ventricular system appear within normal limits. No intracranial hemorrhage, mass lesion, or acute CVA.  Developmental failure of fusion of the posterior arch of C1. Chronic right sphenoid sinusitis.  IMPRESSION: 1. No acute intracranial findings. 2. Small remote lacunar infarcts along the right lentiform nucleus and right internal capsule. 3. Periventricular white matter and corona radiata hypodensities favor chronic ischemic microvascular white matter disease. 4. Mild chronic right sphenoid sinusitis.   Electronically Signed   By: Sherryl Barters M.D.   On: 09/15/2014 16:11   Ct Lumbar Spine Wo Contrast  09/15/2014   CLINICAL DATA:  Fall 2 days ago with low back pain. Recently negative radiographs.  EXAM: CT LUMBAR SPINE WITHOUT CONTRAST  TECHNIQUE: Multidetector CT imaging of the lumbar spine was performed without intravenous contrast administration. Multiplanar CT image reconstructions were also generated.  COMPARISON:  None.  FINDINGS: Acute, nondisplaced fractures of the L1, L2, and L3 right transverse processes. No vertebral body fracture or subluxation.  Diffuse degenerative disc narrowing, most notable at L4-5 where there is also chronic right lateral translation of the L4 vertebra. No evidence of acute disc herniation/stenosis.  IMPRESSION: L1 through L3 right transverse process fractures.   Electronically Signed   By: Jorje Guild M.D.   On: 09/15/2014 11:47    Assessment/Plan: 81 y.o.   MDS (myelodysplastic syndrome) with 5q deletion She is tolerating Revlimid well. She has become transfusion independent since 4 weeks ago. She is on 2.5 mg Revlimid daily. I recommend holding Rx due to risk of DVT. In the meantime, will continue her on prednisone 2.5 mg daily. We  discussed some of the risks, benefits, and alternatives of blood transfusions. The patient is symptomatic from anemia and the hemoglobin level is critically low.  Some of the side-effects to be expected including risks of transfusion reactions, chills, infection, syndrome of volume overload and risk of hospitalization from various reasons and the patient is willing to proceed and went ahead to sign consent today. I will give her 1 unit of blood today  Anemia in neoplastic disease She is not symptomatic right now. Continue regular blood work monitoring. I will transfuse 1 unit today  Persistent headaches Exacerbated by fall, prednisone taper. CT head is negative for acute findings. Suspect she may have poor oxygenation of her lungs once prednisone taper was initiated. Headache at this time is controlled with Fioricet   L1-L3 right transverse process fractures after mechanical fall A CT of the lumbar spine on 2/1 revealed L1-L3 right lateral process fractures.  Admitting team discussed with Neurosurgery, who recommends conservative treatment as no surgical intervention is indicated. Continue pain meds including hydrocodone/acetaminophen and morphine as needed with bowel support OT/PT consultation obtained  Thrombocytopenia due to drugs This is likely due to recent treatment, and acute illness.  The patient denies recent history of bleeding such as epistaxis, hematuria or hematochezia.  She is asymptomatic from the low platelet count. She does not require transfusion now, will continue to monitor.  Other constipation This is related to recent prescription of pain medicine for headaches and back pain, complicated with decreased mobility due to fracture. Last bowel movement 4 days ago Initiate Laxative and stool softener support to avoid impaction  DVT prophylaxis On Lovenox Hold Lovenox if platelets drop to less than 50,000 or bleeding occurs.   COPD Tobacco Abuse Oxygen and steroid  dependent Nicotine Patch started Continue present therapy as per primary team.   Full Code  Discharge planning To skilled nursing facility when stable.  Other medical issues as per admitting team Will sign off. She has follow-up appointment to see me in 2 weeks.  **Disclaimer: This note was dictated with voice recognition software. Similar sounding words can inadvertently be transcribed and this note may contain transcription errors which may not have been corrected upon publication of note.Sharene Butters E, PA-C 09/16/2014  7:13 AM  Luca Dyar, MD 09/16/2014

## 2014-09-16 NOTE — Progress Notes (Signed)
Clinical Social Work Department BRIEF PSYCHOSOCIAL ASSESSMENT 09/16/2014  Patient:  Tonya Mckee, Tonya Mckee     Account Number:  [id number]     Admit date:  09/15/2014  Clinical Social Worker:  Glorious Peach, CLINICAL SOCIAL WORKER  Date/Time:  09/16/2014 11:08 AM  Referred by:  Physician  Date Referred:  09/16/2014 Referred for  SNF Placement   Other Referral:   Interview type:  Patient Other interview type:   And daughter Tonya Mckee via phone.    PSYCHOSOCIAL DATA Living Status:  ALONE Admitted from facility:   Level of care:   Primary support name:  Tonya Mckee Primary support relationship to patient:  CHILD, ADULT Degree of support available:   Adequate    CURRENT CONCERNS Current Concerns  Post-Acute Placement   Other Concerns:    SOCIAL WORK ASSESSMENT / PLAN CSW faxed out FL2, and received bed offers. CSW to give patient list of SNFs that offered beds.   Assessment/plan status:  Psychosocial Support/Ongoing Assessment of Needs Other assessment/ plan:   Information/referral to community resources:    PATIENT'S/FAMILY'S RESPONSE TO PLAN OF CARE: CSW spoke with patient at bedside and daughter, Tonya Mckee via phone regarding DC planning. CSW introduced self and explained role. Patient reports of being really tired and unable to make decisions at this time on DC plans. CSW encouraged patient to get some rest and received permission from patient to contact daughter Tonya Mckee. CSW spoke with [facility] via phone. CSW explained that PT recommends patient receive intense rehab after DC from hospital. Daughter, Tonya Mckee agreeable and states that she will not be able to assist mother when DC alone and feels that SNF is the best option. CSW told daughter that we have faxed patient information out and received a few bed offers and left the list of the facilities that have offered beds at patient bedside. Daughter states that she will be at [facility] in a few to look over list and will call around to see which SNF  will be best for patient. CSW encouraged daughter to visit SNFs and give CSW a call when they are ready to make a decision. CSW will continue to follow and set up for DC.       Glorious Peach BSW Intern

## 2014-09-16 NOTE — Progress Notes (Signed)
UR completed 

## 2014-09-16 NOTE — Progress Notes (Signed)
PROGRESS NOTE    Tonya Mckee XQJ:[id number] DOB: 1929-03-23 DOA: 09/15/2014 PCP: [name]  Outpatient Specialists:   Pulmonology  Oncology: Dr. Heath Lark  HPI/Brief narrative 81 y.o. 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 CT of the back reveals L1-L3 right lateral transfer process fractures.   Assessment/Plan:  1. Intractable back pain secondary to L1-L3 right transverse process fractures, sustained post mechanical fall: Discussed with neurosurgeon on call on 2/1 who recommended that no surgical intervention was indicated. Continue symptomatic/supportive treatment and a lumbar corset. PT and OT evaluation. CT head: No acute findings. 2. Hypokalemia: Replaced 3. COPD-oxygen and steroid dependent: Stable. Continue steroids, oxygen and bronchodilators when necessary. Prednisone may actually be for MDS and not COPD. 4. Chronic hypoxic respiratory failure: Continue oxygen. 5. Myelodysplastic syndrome with 5 q deletion: Oncology input appreciated-discontinuing Revlimid until outpatient follow-up due to risk of DVT and transfusing 1 unit of PRBC. 6. Essential hypertension: Controlled. Continue metoprolol and lisinopril. 7. Anemia and thrombocytopenia secondary to MDS: Hemoglobin 8.1. Oncology recommends transfusing 1 unit PRBC. Platelets stable. 8. Tobacco abuse: Cessation counseled. Patient declines nicotine patch. 9. History of anxiety and depression: Continue home meds.   Code Status: Full Family Communication: None at bedside Disposition Plan: SNF when medically  stable/ready   Consultants:  Oncology  Procedures:  None  Antibiotics:  None   Subjective: Complaining of severe back pain this AM - nursing had just moved her around for bathing.  Objective: Filed Vitals:   09/16/14 0459 09/16/14 1500 09/16/14 1552 09/16/14 1627  BP: 163/64 124/54 117/57 120/50  Pulse: 90 85 69 67  Temp: 100 F (37.8 C) 98.2 F (36.8 C) 98.3 F (36.8 C) 98.3 F (36.8 C)  TempSrc: Oral Oral Oral Oral  Resp: 20 18 18 18   SpO2: 98% 99% 99% 100%    Intake/Output Summary (Last 24 hours) at 09/16/14 1712 Last data filed at 09/16/14 1627  Gross per 24 hour  Intake    335 ml  Output      0 ml  Net    335 ml   There were no vitals filed for this visit.   Exam:  General exam: Pleasant elderly female lying in bed, uncomfortable with pain. Respiratory system: Clear. No increased work of breathing. Cardiovascular system: S1 & S2 heard, RRR. No JVD, murmurs, gallops, clicks or pedal edema. Gastrointestinal system: Abdomen is nondistended, soft and nontender. Normal bowel sounds heard. Central nervous system: Alert and oriented. No focal neurological deficits. Extremities: Symmetric 5 x 5 power.   Data Reviewed: Basic Metabolic Panel:  Recent Labs Lab 09/15/14 1331 09/16/14 0500  NA 137 136  K 3.1* 3.7  CL 104 102  CO2 25 25  GLUCOSE 111* 103*  BUN 16 11  CREATININE 0.61 0.62  CALCIUM 8.2* 7.8*   Liver Function Tests:  Recent Labs Lab 09/15/14 1331  AST 15  ALT 8  ALKPHOS 65  BILITOT 0.8  PROT 6.9  ALBUMIN 3.4*   No results for input(s): LIPASE, AMYLASE in the last 168 hours. No results for input(s): AMMONIA in the last 168 hours. CBC:  Recent Labs Lab 09/15/14 1331 09/16/14 0500  WBC 8.8 8.6  NEUTROABS 5.7  --  HGB 8.8* 8.1*  HCT 26.9* 25.0*  MCV 97.5 98.0  PLT 84* 80*   Cardiac Enzymes: No results for input(s): CKTOTAL, CKMB, CKMBINDEX, TROPONINI in the last 168 hours. BNP (last 3 results) No results for  input(s): PROBNP in the last 8760 hours. CBG: No results for input(s): GLUCAP in the last 168 hours.  No results found for this or any previous visit (from the past 240 hour(s)).        Studies: Dg Ribs Unilateral W/chest Right  09/15/2014   CLINICAL DATA:  Initial encounter for fall Saturday with right-sided pain. Shortness of breath and nausea. Ex-smoker.  EXAM: RIGHT RIBS AND CHEST - 3+ VIEW  COMPARISON:  07/30/2014 and CT of 12/19/2013.  FINDINGS: Frontal view of the chest and three views of right-sided ribs. Frontal view of the chest demonstrates left axillary surgical clips. Midline trachea. Mild cardiomegaly with a tortuous atherosclerotic aorta. No pleural effusion or pneumothorax. Interstitial thickening is upper lobe predominant and likely related chronic bronchitis/ smoking. No lobar consolidation.  Three views of right-sided ribs demonstrate no displaced rib fracture  IMPRESSION: No displaced rib fracture, pleural fluid, or pneumothorax.  Cardiomegaly with chronic interstitial thickening, likely related to the clinical history of smoking.   Electronically Signed   By: Abigail Miyamoto M.D.   On: 09/15/2014 12:23   Dg Pelvis 1-2 Views  09/15/2014   CLINICAL DATA:  Fall on Saturday with right lower back pain.  EXAM: PELVIS - 1-2 VIEW  COMPARISON:  None.  FINDINGS: There is no evidence of pelvic fracture or diastasis. The hip joints show normal alignment. No pelvic bone lesions are seen. Soft tissues are unremarkable.  IMPRESSION: Normal bony pelvis.   Electronically Signed   By: Aletta Edouard M.D.   On: 09/15/2014 12:22   Ct Head Wo Contrast  09/15/2014   CLINICAL DATA:  Persistent headaches. Fall 2 days ago, injuring the back. Struck the back of the head with transient loss of consciousness.  EXAM: CT HEAD WITHOUT CONTRAST  TECHNIQUE: Contiguous axial images were obtained from the base of the skull through the vertex without intravenous contrast.  COMPARISON:  04/15/2014.  FINDINGS: Small  lacunar infarcts are suspected in the right lentiform nucleus and anterior limb right internal capsule, and posterior limb right internal capsule. Periventricular white matter and corona radiata hypodensities favor chronic ischemic microvascular white matter disease.  Otherwise, the brainstem, cerebellum, cerebral peduncles, thalamus, basal ganglia, basilar cisterns, and ventricular system appear within normal limits. No intracranial hemorrhage, mass lesion, or acute CVA.  Developmental failure of fusion of the posterior arch of C1. Chronic right sphenoid sinusitis.  IMPRESSION: 1. No acute intracranial findings. 2. Small remote lacunar infarcts along the right lentiform nucleus and right internal capsule. 3. Periventricular white matter and corona radiata hypodensities favor chronic ischemic microvascular white matter disease. 4. Mild chronic right sphenoid sinusitis.   Electronically Signed   By: Sherryl Barters M.D.   On: 09/15/2014 16:11   Ct Lumbar Spine Wo Contrast  09/15/2014   CLINICAL DATA:  Fall 2 days ago with low back pain. Recently negative radiographs.  EXAM: CT LUMBAR SPINE WITHOUT CONTRAST  TECHNIQUE: Multidetector CT imaging of the lumbar spine was performed without intravenous contrast administration. Multiplanar CT image reconstructions were also generated.  COMPARISON:  None.  FINDINGS: Acute, nondisplaced fractures of the L1, L2, and L3 right transverse processes. No vertebral body fracture or subluxation.  Diffuse degenerative disc narrowing, most notable at L4-5 where there is also chronic right  lateral translation of the L4 vertebra. No evidence of acute disc herniation/stenosis.  IMPRESSION: L1 through L3 right transverse process fractures.   Electronically Signed   By: Jorje Guild M.D.   On: 09/15/2014 11:47        Scheduled Meds: . antiseptic oral rinse  7 mL Mouth Rinse q12n4p  . chlorhexidine  15 mL Mouth Rinse BID  . cholecalciferol  2,000 Units Oral Daily  .  cyclobenzaprine  5 mg Oral TID  . enoxaparin (LOVENOX) injection  40 mg Subcutaneous Q24H  . FLUoxetine  30 mg Oral q morning - 10a  . lisinopril  10 mg Oral Daily  . metoprolol succinate  50 mg Oral QPM  . polyethylene glycol  17 g Oral Daily  . predniSONE  2.5 mg Oral Q breakfast  . senna-docusate  1 tablet Oral BID   Continuous Infusions:   Principal Problem:   Fracture of lumbar spine L 1-3 transverse process Active Problems:   Hypertension   COPD (chronic obstructive pulmonary disease)   MDS (myelodysplastic syndrome) with 5q deletion   Tobacco abuse   Anemia in neoplastic disease   Chronic respiratory failure   Fall    Time spent: 30 minutes.    Vernell Leep, MD, FACP, FHM. [facility] Pager [phone]  If 7PM-7AM, please contact night-coverage [email or web address] Password [email or web address] 09/16/2014, 5:12 PM    LOS: 1 day

## 2014-09-16 NOTE — Progress Notes (Signed)
OT Cancellation Note  Patient Details Name: SHERRYE PUGA MRN: [id number] DOB: 01-11-1929   Cancelled Treatment:    Reason Eval/Treat Not Completed: OT screened, patient max-total A +2 with all mobility at this time, all OT needs can be met in next level of care (SNF). Defer OT evaluation/treatment to SNF.  Demani Mcbrien A 09/16/2014, 11:20 AM

## 2014-09-16 NOTE — Evaluation (Signed)
Physical Therapy Evaluation Patient Details Name: Tonya Mckee MRN: [id number] DOB: January 18, 1929 Today's Date: 09/16/2014   History of Present Illness  81 yo female admitted 09/15/14 after fall at home and sustained L1-3 transverse process fractures on the R. Per MD note, neurosurgery consulted and advised for patient to wear LSO for comfort.  Clinical Impression  Patient tolerated mobilizing to the edge of the bed and standing with extensive assistance of 2 persons. Patient will benefit from PT to address  The problems listed in the note below.     Follow Up Recommendations SNF;Supervision/Assistance - 24 hour    Equipment Recommendations  None recommended by PT    Recommendations for Other Services       Precautions / Restrictions Precautions Precautions: Fall Required Braces or Orthoses: Spinal Brace Spinal Brace: Lumbar corset Other Brace/Splint: per NS, for comfort, no specific orders. To wear when OOB per RN.      Mobility  Bed Mobility Overal bed mobility: Needs Assistance;+2 for physical assistance;+ 2 for safety/equipment Bed Mobility: Supine to Sit;Sit to Supine     Supine to sit: Total assist;+2 for physical assistance;HOB elevated Sit to supine: Total assist;+2 for physical assistance;HOB elevated;+2 for safety/equipment   General bed mobility comments: placed HOB in highest position, then used bed pad to turn patient to  sit on the edge, assist with trunk and legs throughout. patient required total assist to get back into bed, assisting trunk and legs totally, much c/o pain  especially when return to supine.  Transfers Overall transfer level: Needs assistance Equipment used: Rolling walker (2 wheeled) Transfers: Sit to/from Stand Sit to Stand: Max assist;+2 physical assistance;+2 safety/equipment;From elevated surface         General transfer comment: lifting asist to rise from bed and lower to sittin onto bed, extra time to work through  pain./  Ambulation/Gait Ambulation/Gait assistance: +2 physical assistance;+2 safety/equipment;Mod assist   Assistive device: Rolling walker (2 wheeled)       General Gait Details: able to take 5 side steps along bed, very slowly  ,  Stairs            Wheelchair Mobility    Modified Rankin (Stroke Patients Only)       Balance Overall balance assessment: History of Falls;Needs assistance Sitting-balance support: Bilateral upper extremity supported;Feet supported Sitting balance-Leahy Scale: Poor Sitting balance - Comments: guarding  back and holds terunk rigid, unable to sit without UE support.    Standing balance support: During functional activity;Bilateral upper extremity supported Standing balance-Leahy Scale: Poor                               Pertinent Vitals/Pain Pain Assessment: 0-10 Pain Score: 8  Pain Location: R back, noted bruising  on R posterior trunk and R hip but hip not painful Pain Descriptors / Indicators: Cramping;Crushing;Grimacing;Guarding;Moaning Pain Intervention(s): Limited activity within patient's tolerance;Premedicated before session;Monitored during session;Repositioned    Home Living Family/patient expects to be discharged to:: Skilled nursing facility                      Prior Function Level of Independence: Independent               Hand Dominance        Extremity/Trunk Assessment   Upper Extremity Assessment: Generalized weakness           Lower Extremity Assessment: Generalized weakness (able to bear  weight for standing at the bedside at [facility].)      Cervical / Trunk Assessment: Kyphotic  Communication   Communication: HOH  Cognition Arousal/Alertness: Awake/alert Behavior During Therapy: WFL for tasks assessed/performed Overall Cognitive Status: Within Functional Limits for tasks assessed                      General Comments      Exercises        Assessment/Plan     PT Assessment Patient needs continued PT services  PT Diagnosis Difficulty walking;Acute pain   PT Problem List Decreased range of motion;Decreased activity tolerance;Decreased balance;Decreased mobility;Decreased knowledge of precautions;Decreased safety awareness;Decreased knowledge of use of DME;Pain  PT Treatment Interventions DME instruction;Gait training;Functional mobility training;Therapeutic activities;Therapeutic exercise;Patient/family education   PT Goals (Current goals can be found in the Care Plan section) Acute Rehab PT Goals Patient Stated Goal: to get over this pain PT Goal Formulation: With patient Time For Goal Achievement: 09/30/14 Potential to Achieve Goals: Good    Frequency Min 3X/week   Barriers to discharge Decreased caregiver support      Co-evaluation               End of Session   Activity Tolerance: Patient limited by pain Patient left: in bed;with call bell/phone within reach;with bed alarm set Nurse Communication: Mobility status    Functional Assessment Tool Used: clinical judgement Functional Limitation: Mobility: Walking and moving around Mobility: Walking and Moving Around Current Status (W5462): 100 percent impaired, limited or restricted Mobility: Walking and Moving Around Goal Status (V0350): At least 1 percent but less than 20 percent impaired, limited or restricted    Time: 0925-0952 PT Time Calculation (min) (ACUTE ONLY): 27 min   Charges:   PT Evaluation $Initial PT Evaluation Tier I: 1 Procedure PT Treatments $Therapeutic Activity: 8-22 mins   PT G Codes:   PT G-Codes **NOT FOR INPATIENT CLASS** Functional Assessment Tool Used: clinical judgement Functional Limitation: Mobility: Walking and moving around Mobility: Walking and Moving Around Current Status (K9381): 100 percent impaired, limited or restricted Mobility: Walking and Moving Around Goal Status (W2993): At least 1 percent but less than 20 percent impaired, limited  or restricted    Claretha Cooper 09/16/2014, 10:40 AM  Tresa Endo PT [phone]

## 2014-09-17 DIAGNOSIS — S32009S Unspecified fracture of unspecified lumbar vertebra, sequela: Secondary | ICD-10-CM

## 2014-09-17 LAB — TYPE AND SCREEN
ABO/RH(D): O POS
Antibody Screen: NEGATIVE
Unit division: 0

## 2014-09-17 LAB — CBC
HCT: 28.4 % — ABNORMAL LOW (ref 36.0–46.0)
HEMOGLOBIN: 9.2 g/dL — AB (ref 12.0–15.0)
MCH: 31.7 pg (ref 26.0–34.0)
MCHC: 32.4 g/dL (ref 30.0–36.0)
MCV: 97.9 fL (ref 78.0–100.0)
Platelets: 79 10*3/uL — ABNORMAL LOW (ref 150–400)
RBC: 2.9 MIL/uL — ABNORMAL LOW (ref 3.87–5.11)
RDW: 20.2 % — AB (ref 11.5–15.5)
WBC: 6.8 10*3/uL (ref 4.0–10.5)

## 2014-09-17 MED ORDER — FLUOXETINE HCL 10 MG PO CAPS: 30.0000 mg | ORAL_CAPSULE | Freq: Every morning | ORAL | Status: AC

## 2014-09-17 MED ORDER — CHLORHEXIDINE GLUCONATE 0.12 % MT SOLN: 15.0000 mL | Freq: Two times a day (BID) | OROMUCOSAL | Status: AC

## 2014-09-17 MED ORDER — HYDROCODONE-ACETAMINOPHEN 5-325 MG PO TABS: 1.0000 | ORAL_TABLET | ORAL | Status: DC | PRN

## 2014-09-17 MED ORDER — FAMOTIDINE 10 MG PO TABS: 10.0000 mg | ORAL_TABLET | Freq: Two times a day (BID) | ORAL | Status: AC

## 2014-09-17 MED ORDER — CYCLOBENZAPRINE HCL 5 MG PO TABS: 5.0000 mg | ORAL_TABLET | Freq: Three times a day (TID) | ORAL | Status: DC

## 2014-09-17 MED ORDER — POLYETHYLENE GLYCOL 3350 17 G PO PACK: 17.0000 g | PACK | Freq: Every day | ORAL | Status: DC

## 2014-09-17 MED ORDER — ONDANSETRON HCL 4 MG PO TABS: 4.0000 mg | ORAL_TABLET | Freq: Four times a day (QID) | ORAL | Status: AC | PRN

## 2014-09-17 MED ORDER — SENNOSIDES-DOCUSATE SODIUM 8.6-50 MG PO TABS: 1.0000 | ORAL_TABLET | Freq: Two times a day (BID) | ORAL | Status: DC

## 2014-09-17 MED ORDER — LORAZEPAM 0.5 MG PO TABS: 0.5000 mg | ORAL_TABLET | Freq: Three times a day (TID) | ORAL | Status: DC | PRN

## 2014-09-17 MED ORDER — IPRATROPIUM-ALBUTEROL 0.5-2.5 (3) MG/3ML IN SOLN: 3.0000 mL | Freq: Two times a day (BID) | RESPIRATORY_TRACT | Status: AC | PRN

## 2014-09-17 NOTE — Progress Notes (Signed)
Report called to [facility]. Report given to Wilber Bihari, LPN. No questions at this time. Number left for questions if needed. Patient transported via [place].

## 2014-09-17 NOTE — Discharge Instructions (Signed)

## 2014-09-17 NOTE — Progress Notes (Signed)
Clinical Social Work  CSW spoke with dtr and patient who have chosen [facility] for placement. CSW faxed DC summary to SNF who is agreeable to accept today. CSW prepared DC packet with FL2, DC summary, hard scripts, and PTAR form included. Patient and dtr request PTAR and aware of no guarantee of payment. RN to call report. PTAR arranged for 2pm per SNF request. PTAR #:[id number].  CSW is signing off but available if needed.  [place], [place] [phone]

## 2014-09-17 NOTE — Discharge Summary (Signed)
Physician Discharge Summary  Tonya Mckee FGH:[id number] DOB: [birth year] DOA: 09/15/2014  PCP: [name]  Admit date: 09/15/2014 Discharge date: 09/17/2014  Recommendations for Outpatient Follow-up:  1. Check CBC and BMP per SNF protocol   Discharge Diagnoses:  Principal Problem:   Fracture of lumbar spine L 1-3 transverse process Active Problems:   Hypertension   COPD (chronic obstructive pulmonary disease)   MDS (myelodysplastic syndrome) with 5q deletion   Tobacco abuse   Anemia in neoplastic disease   Chronic respiratory failure   Fall    Discharge Condition: stable   Diet recommendation: as tolerated   History of present illness:  81 y.o. female with history of chronic respiratory failure on home oxygen 2L/min, steroid dependant COPD, MDS with 5q deletion on Revlimid, anemia in neoplastic disease, persistent headaches, thrombocytopenia related to medications, HTN, stroke, GERD, dementia, depression, anxiety who presented to [facility] ED with worsening low back pain since the fall. CT of the back revealed L1-L3 right lateral transfer process fractures.   Assessment/Plan:  Principal Problem: Intractable back pain secondary to L1-L3 right transverse process fractures, sustained post mechanical fall - per neurosurgeon on call on 2/1 - advise against surgical intervention, continue symptomatic/supportive care and a lumbar corset.  - CT head: No acute findings. - D/C to SNF per PT eval recommendation   Active Problems: Hypokalemia - likely due to nebulizer treatment - repleted  - potassium WNL    COPD-oxygen and steroid dependent / Chronic hypoxic respiratory failure - respiratory status stable - continue home inhaler/ neb regimen - ma continue low dose prednisone  - Continue oxygen support via [place] to keep O2 sats above 90%  Myelodysplastic syndrome with 5 q deletion - Oncology recommended discontinuing Revlimid until outpatient follow-up due to risk of DVT  -  continue low dose daily prednisone   Essential hypertension - Continue metoprolol and lisinopril.  Anemia and thrombocytopenia secondary to MDS and chemotherapy  - has received 1 unit PRBC since admission - hemoglobin is 9.2 prior to discharge   Tobacco abuse - Counseled on cessation - Patient declined nicotine patch.  History of anxiety and depression - continue ativan per prior home dose    Code Status: Full Family Communication: family not at the bedside this am     Consultants:  Oncology  Procedures:  None  Antibiotics:  None   Signed:  Leisa Lenz, MD  [facility] 09/17/2014, 11:01 AM  Pager #: [phone]   Discharge Exam: Filed Vitals:   09/17/14 0546  BP: 151/64  Pulse: 67  Temp: 98.6 F (37 C)  Resp: 18   Filed Vitals:   09/16/14 1552 09/16/14 1627 09/16/14 2049 09/17/14 0546  BP: 117/57 120/50 148/64 151/64  Pulse: 69 67 66 67  Temp: 98.3 F (36.8 C) 98.3 F (36.8 C) 98.5 F (36.9 C) 98.6 F (37 C)  TempSrc: Oral Oral Oral Oral  Resp: 18 18 18 18   SpO2: 99% 100% 100% 98%    General: Pt is alert, follows commands appropriately, not in acute distress Cardiovascular: Regular rate and rhythm, S1/S2 (+) Respiratory: no wheezing, no crackles, no rhonchi Abdominal: non distended, bowel sounds +, no guarding Extremities: no edema, no cyanosis, pulses palpable bilaterally DP and PT Neuro: Grossly nonfocal  Discharge Instructions  Discharge Instructions    Call MD for:  difficulty breathing, headache or visual disturbances    Complete by:  As directed      Call MD for:  persistant nausea and vomiting  Complete by:  As directed      Call MD for:  severe uncontrolled pain    Complete by:  As directed      Diet - low sodium heart healthy    Complete by:  As directed      Increase activity slowly    Complete by:  As directed             Medication List    STOP taking these medications        ibuprofen 200 MG tablet   Commonly known as:  ADVIL,MOTRIN     lenalidomide 2.5 MG capsule  Commonly known as:  REVLIMID      TAKE these medications        acetaminophen 325 MG tablet  Commonly known as:  TYLENOL  Take 650 mg by mouth every 6 (six) hours as needed for moderate pain (pain).     albuterol 108 (90 BASE) MCG/ACT inhaler  Commonly known as:  PROVENTIL HFA;VENTOLIN HFA  Inhale 2 puffs into the lungs every 6 (six) hours as needed for wheezing or shortness of breath (wheezing).     butalbital-acetaminophen-caffeine 50-325-40 MG per tablet  Commonly known as:  FIORICET  Take 1 tablet by mouth every 6 (six) hours as needed for headache.     calcium carbonate 500 MG chewable tablet  Commonly known as:  TUMS - dosed in mg elemental calcium  Chew 1 tablet by mouth daily as needed for indigestion or heartburn.     chlorhexidine 0.12 % solution  Commonly known as:  PERIDEX  15 mLs by Mouth Rinse route 2 (two) times daily.     cyclobenzaprine 5 MG tablet  Commonly known as:  FLEXERIL  Take 1 tablet (5 mg total) by mouth 3 (three) times daily.     famotidine 10 MG tablet  Commonly known as:  PEPCID AC  Take 1 tablet (10 mg total) by mouth 2 (two) times daily.     FLUoxetine 10 MG capsule  Commonly known as:  PROZAC  Take 3 capsules (30 mg total) by mouth every morning.     fluticasone 50 MCG/ACT nasal spray  Commonly known as:  FLONASE  Place 1 spray into the nose daily as needed for allergies (allergies).     HYDROcodone-acetaminophen 5-325 MG per tablet  Commonly known as:  NORCO/VICODIN  Take 1-2 tablets by mouth every 4 (four) hours as needed for moderate pain or severe pain.     ipratropium-albuterol 0.5-2.5 (3) MG/3ML Soln  Commonly known as:  DUONEB  Take 3 mLs by nebulization 2 (two) times daily as needed (wheezing).     lisinopril 10 MG tablet  Commonly known as:  PRINIVIL,ZESTRIL  Take 10 mg by mouth daily.     LORazepam 0.5 MG tablet  Commonly known as:  ATIVAN  Take 1  tablet (0.5 mg total) by mouth every 8 (eight) hours as needed for anxiety (anxiety).     menthol-cetylpyridinium 3 MG lozenge  Commonly known as:  CEPACOL  Take 1 lozenge (3 mg total) by mouth as needed for sore throat.     metoprolol succinate 50 MG 24 hr tablet  Commonly known as:  TOPROL-XL  Take 50 mg by mouth every evening. Take with or immediately following a meal.     ondansetron 4 MG tablet  Commonly known as:  ZOFRAN  Take 1 tablet (4 mg total) by mouth every 6 (six) hours as needed for nausea.     polyethylene  glycol packet  Commonly known as:  MIRALAX / GLYCOLAX  Take 17 g by mouth daily.     predniSONE 2.5 MG tablet  Commonly known as:  DELTASONE  Take 1 tablet (2.5 mg total) by mouth daily with breakfast.     senna-docusate 8.6-50 MG per tablet  Commonly known as:  Senokot-S  Take 1 tablet by mouth 2 (two) times daily.     Vitamin D 2000 UNITS tablet  Take 2,000 Units by mouth daily.            Follow-up Information    Follow up with [facility], PA-C. Schedule an appointment as soon as possible for a visit in 2 weeks.   Specialty:  Family Medicine   Why:  Follow up appt after recent hospitalization   Contact information:   [address] [place] [id number] [phone]        The results of significant diagnostics from this hospitalization (including imaging, microbiology, ancillary and laboratory) are listed below for reference.    Significant Diagnostic Studies: Dg Ribs Unilateral W/chest Right  09/15/2014   CLINICAL DATA:  Initial encounter for fall Saturday with right-sided pain. Shortness of breath and nausea. Ex-smoker.  EXAM: RIGHT RIBS AND CHEST - 3+ VIEW  COMPARISON:  07/30/2014 and CT of 12/19/2013.  FINDINGS: Frontal view of the chest and three views of right-sided ribs. Frontal view of the chest demonstrates left axillary surgical clips. Midline trachea. Mild cardiomegaly with a tortuous atherosclerotic aorta. No  pleural effusion or pneumothorax. Interstitial thickening is upper lobe predominant and likely related chronic bronchitis/ smoking. No lobar consolidation.  Three views of right-sided ribs demonstrate no displaced rib fracture  IMPRESSION: No displaced rib fracture, pleural fluid, or pneumothorax.  Cardiomegaly with chronic interstitial thickening, likely related to the clinical history of smoking.   Electronically Signed   By: Abigail Miyamoto M.D.   On: 09/15/2014 12:23   Dg Lumbar Spine Complete  09/13/2014   CLINICAL DATA:  Status post fall out of bed today.  Back pain.  EXAM: LUMBAR SPINE - COMPLETE 4+ VIEW  COMPARISON:  CT abdomen and pelvis 12/19/2013.  FINDINGS: Convex right scoliosis is again seen. There is no fracture. Alignment is normal. Facet degenerative disease is most notable at L4-5 and L5-S1.  IMPRESSION: No acute abnormality.   Electronically Signed   By: Inge Rise M.D.   On: 09/13/2014 17:25   Dg Pelvis 1-2 Views  09/15/2014   CLINICAL DATA:  Fall on Saturday with right lower back pain.  EXAM: PELVIS - 1-2 VIEW  COMPARISON:  None.  FINDINGS: There is no evidence of pelvic fracture or diastasis. The hip joints show normal alignment. No pelvic bone lesions are seen. Soft tissues are unremarkable.  IMPRESSION: Normal bony pelvis.   Electronically Signed   By: Aletta Edouard M.D.   On: 09/15/2014 12:22   Ct Head Wo Contrast  09/15/2014   CLINICAL DATA:  Persistent headaches. Fall 2 days ago, injuring the back. Struck the back of the head with transient loss of consciousness.  EXAM: CT HEAD WITHOUT CONTRAST  TECHNIQUE: Contiguous axial images were obtained from the base of the skull through the vertex without intravenous contrast.  COMPARISON:  04/15/2014.  FINDINGS: Small lacunar infarcts are suspected in the right lentiform nucleus and anterior limb right internal capsule, and posterior limb right internal capsule. Periventricular white matter and corona radiata hypodensities favor  chronic ischemic microvascular white matter disease.  Otherwise, the brainstem, cerebellum, cerebral  peduncles, thalamus, basal ganglia, basilar cisterns, and ventricular system appear within normal limits. No intracranial hemorrhage, mass lesion, or acute CVA.  Developmental failure of fusion of the posterior arch of C1. Chronic right sphenoid sinusitis.  IMPRESSION: 1. No acute intracranial findings. 2. Small remote lacunar infarcts along the right lentiform nucleus and right internal capsule. 3. Periventricular white matter and corona radiata hypodensities favor chronic ischemic microvascular white matter disease. 4. Mild chronic right sphenoid sinusitis.   Electronically Signed   By: Sherryl Barters M.D.   On: 09/15/2014 16:11   Ct Lumbar Spine Wo Contrast  09/15/2014   CLINICAL DATA:  Fall 2 days ago with low back pain. Recently negative radiographs.  EXAM: CT LUMBAR SPINE WITHOUT CONTRAST  TECHNIQUE: Multidetector CT imaging of the lumbar spine was performed without intravenous contrast administration. Multiplanar CT image reconstructions were also generated.  COMPARISON:  None.  FINDINGS: Acute, nondisplaced fractures of the L1, L2, and L3 right transverse processes. No vertebral body fracture or subluxation.  Diffuse degenerative disc narrowing, most notable at L4-5 where there is also chronic right lateral translation of the L4 vertebra. No evidence of acute disc herniation/stenosis.  IMPRESSION: L1 through L3 right transverse process fractures.   Electronically Signed   By: Jorje Guild M.D.   On: 09/15/2014 11:47    Microbiology: No results found for this or any previous visit (from the past 240 hour(s)).   Labs: Basic Metabolic Panel:  Recent Labs Lab 09/15/14 1331 09/16/14 0500  NA 137 136  K 3.1* 3.7  CL 104 102  CO2 25 25  GLUCOSE 111* 103*  BUN 16 11  CREATININE 0.61 0.62  CALCIUM 8.2* 7.8*   Liver Function Tests:  Recent Labs Lab 09/15/14 1331  AST 15  ALT 8   ALKPHOS 65  BILITOT 0.8  PROT 6.9  ALBUMIN 3.4*   No results for input(s): LIPASE, AMYLASE in the last 168 hours. No results for input(s): AMMONIA in the last 168 hours. CBC:  Recent Labs Lab 09/15/14 1331 09/16/14 0500 09/17/14 0515  WBC 8.8 8.6 6.8  NEUTROABS 5.7  --   --   HGB 8.8* 8.1* 9.2*  HCT 26.9* 25.0* 28.4*  MCV 97.5 98.0 97.9  PLT 84* 80* 79*   Cardiac Enzymes: No results for input(s): CKTOTAL, CKMB, CKMBINDEX, TROPONINI in the last 168 hours. BNP: BNP (last 3 results) No results for input(s): BNP in the last 8760 hours.  ProBNP (last 3 results) No results for input(s): PROBNP in the last 8760 hours.  CBG: No results for input(s): GLUCAP in the last 168 hours.  Time coordinating discharge: Over 30 minutes

## 2014-09-23 ENCOUNTER — Telehealth: Payer: Self-pay | Admitting: Hematology and Oncology

## 2014-09-23 NOTE — Telephone Encounter (Signed)
returned call and lvm for pt regarding to r/s 2.18 appt...md first available is 3.3...advised pt to call back if she still needs to r/s

## 2014-09-24 ENCOUNTER — Telehealth: Payer: Self-pay | Admitting: Hematology and Oncology

## 2014-09-24 NOTE — Telephone Encounter (Signed)
pt dtr called to r/s appt...done...pt ok and aware

## 2014-09-26 ENCOUNTER — Institutional Professional Consult (permissible substitution): Payer: Medicare Other | Admitting: Internal Medicine

## 2014-10-02 ENCOUNTER — Other Ambulatory Visit: Payer: Medicare Other

## 2014-10-02 ENCOUNTER — Ambulatory Visit: Payer: Medicare Other | Admitting: Hematology and Oncology

## 2014-10-06 ENCOUNTER — Ambulatory Visit (INDEPENDENT_AMBULATORY_CARE_PROVIDER_SITE_OTHER): Payer: Medicare Other | Admitting: Internal Medicine

## 2014-10-06 ENCOUNTER — Encounter: Payer: Self-pay | Admitting: Internal Medicine

## 2014-10-06 VITALS — BP 116/80 | HR 68 | Ht 62.0 in | Wt 113.0 lb

## 2014-10-06 DIAGNOSIS — J449 Chronic obstructive pulmonary disease, unspecified: Secondary | ICD-10-CM

## 2014-10-06 DIAGNOSIS — I10 Essential (primary) hypertension: Secondary | ICD-10-CM

## 2014-10-06 DIAGNOSIS — R0600 Dyspnea, unspecified: Secondary | ICD-10-CM

## 2014-10-06 DIAGNOSIS — J9611 Chronic respiratory failure with hypoxia: Secondary | ICD-10-CM

## 2014-10-06 MED ORDER — VALSARTAN 160 MG PO TABS: 160.0000 mg | ORAL_TABLET | Freq: Every day | ORAL | Status: DC

## 2014-10-06 NOTE — Patient Instructions (Addendum)
Stop lisinopril   Diovan 160 mg daily in its place  Only use nebulizer if you feel you need it - ok to use up to every 4 hours and not at all if your breathing is comfortable  You should see me if you feel you continue to need the nebulizer more than twice daily off the lisinopril for 4 weeks or if at any point you feel like your breathing is holding you back from desired activities.  No need for 02 at rest - will need have it monitored if decide to stop it at bedtime and you can call us after discharge to set up this study but at the rehab facility they are monitoring you with a goal of keeping the 02 sats over 90% at all times

## 2014-10-06 NOTE — Progress Notes (Signed)
Subjective:    Patient ID: Tonya Mckee, female    DOB: 1928/12/30,    MRN: [id number]  HPI  18 yowf quit smoking 08/2014 with doe x around 2012 and GOLD II COPD criteria not prev 02 dep  then fell > admit  Admit date: 09/15/2014 Discharge date: 09/17/2014    Discharge Diagnoses:   Fracture of lumbar spine L 1-3 transverse process   Hypertension  COPD (chronic obstructive pulmonary disease)  MDS (myelodysplastic syndrome) with 5q deletion  Tobacco abuse  Anemia in neoplastic disease  Chronic respiratory failure  Fall     10/06/2014 1st [facility] office visit/ Kalix Meinecke   Chief Complaint  Patient presents with  . Pulmonary Consult    Referred by Dr. Deforest Hoyles for eval of COPD. Pt states that she was dxed with "years ago".  Pt c/o DOE with walking up hills and "alot up walking".    indolent onset doe x 4-5 years proair seemed to help the most then duoneb prior to admit No noct or early am exac of cough/ wheeze Limited by back not by  breathing  Cough is dry and minimal and not better with saba   No obvious other patterns in day to day or daytime variabilty or assoc   cp or chest tightness, subjective wheeze overt sinus or hb symptoms. No unusual exp hx or h/o childhood pna/ asthma or knowledge of premature birth.  Sleeping ok without nocturnal  or early am exacerbation  of respiratory  c/o's or need for noct saba. Also denies any obvious fluctuation of symptoms with weather or environmental changes or other aggravating or alleviating factors except as outlined above   Current Medications, Allergies, Complete Past Medical History, Past Surgical History, Family History, and Social History were reviewed in [employer] record.             Review of Systems  Constitutional: Negative for fever, chills and unexpected weight change.  HENT: Negative for congestion, dental problem, ear pain, nosebleeds, postnasal drip, rhinorrhea, sinus pressure,  sneezing, sore throat, trouble swallowing and voice change.   Eyes: Negative for visual disturbance.  Respiratory: Positive for cough and shortness of breath. Negative for choking.   Cardiovascular: Negative for chest pain and leg swelling.  Gastrointestinal: Negative for vomiting, abdominal pain and diarrhea.  Genitourinary: Negative for difficulty urinating.  Musculoskeletal: Positive for arthralgias.  Skin: Negative for rash.  Neurological: Negative for tremors, syncope and headaches.  Hematological: Does not bruise/bleed easily.       Objective:   Physical Exam  Frail elderly wf sitting in w/c on 02 with sats 100% on 2lpm and 95% on RA  Wt Readings from Last 3 Encounters:  10/06/14 113 lb (51.256 kg)  09/02/14 118 lb 9.6 oz (53.797 kg)  08/14/14 118 lb 8 oz (53.751 kg)    Vital signs reviewed  HEENT mild turbinate edema.  Oropharynx no thrush or excess pnd or cobblestoning.  No JVD or cervical adenopathy. Mild accessory muscle hypertrophy. Trachea midline, nl thryroid. Chest was hyperinflated by percussion with diminished breath sounds and moderate increased exp time without wheeze. Hoover sign positive at mid inspiration. Regular rate and rhythm without murmur gallop or rub or increase P2 or edema.  Abd: no hsm, nl excursion. Ext warm without cyanosis or clubbing.     I personally reviewed images and agree with radiology impression as follows:  CXR: 09/15/14 Cardiomegaly with chronic interstitial thickening, likely related to the clinical history of smoking.  Assessment & Plan:

## 2014-10-07 ENCOUNTER — Other Ambulatory Visit: Payer: Self-pay | Admitting: *Deleted

## 2014-10-07 ENCOUNTER — Encounter: Payer: Self-pay | Admitting: Internal Medicine

## 2014-10-07 MED ORDER — LENALIDOMIDE 2.5 MG PO CAPS: 2.5000 mg | ORAL_CAPSULE | Freq: Every day | ORAL | Status: DC

## 2014-10-07 NOTE — Assessment & Plan Note (Addendum)
S/p smoking cessation 09/15/14 Spirometry 10/06/14  FEV1  1.07 (63%) ratio 55   As I explained to this patient in detail:  Although she has moderate copd at  present, it may not be clinically relevant:   it does not appear to be limiting activity tolerance any more than a set of worn tires limits someone from driving a car  around a parking lot ( which is what she's doing now because of her back)   A new set of Michelins might look good but would have no perceived impact on the performance of the car and would not be worth the cost.  That is to say:   this pt is so sedentary I don't recommend aggressive pulmonary rx at this point unless limiting symptoms arise or acute exacerbations become as issue, neither of which is the case now.  I asked the patient to contact this office at any time in the future should either of these problems arise.    Also   I reviewed the Flethcher curve with patient that basically indicates  if you quit smoking when your best day FEV1 is still well preserved (as is the case here)  it is highly unlikely you will progress to severe disease and informed the patient there was no medication on the market that has proven to change the curve or the likelihood of progression.  Therefore stopping smoking and maintaining abstinence is the most important aspect of care, not choice of inhalers or for that matter, doctors.

## 2014-10-07 NOTE — Assessment & Plan Note (Signed)
She has a classic acei cough/ not typical at all of cb.  ACE inhibitors are problematic in  pts with airway complaints because  even experienced pulmonologists can't always distinguish ace effects from copd/asthma.  By themselves they don't actually cause a problem, much like oxygen can't by itself start a fire, but they certainly serve as a powerful catalyst or enhancer for any "fire"  or inflammatory process in the upper airway, be it caused by an ET  tube or more commonly reflux (especially in the obese or pts with known GERD or who are on biphoshonates).    In the era of ARB near equivalency until we have a better handle on the reversibility of the airway problem, it just makes sense to avoid ACEI  entirely in the short run  - it's the only way to tell cause and effect  rec try diovan 160 mg daily instead

## 2014-10-07 NOTE — Assessment & Plan Note (Signed)
Placed on 02 at adimt 09/2014 for back trauma - sats RA =95% 10/06/14  Goal is to maintain sat > 90% at all times, but clearly doesn't need it at rest.    Will need ono RA to approve d/c at hs, can be titrated with activity once she's fully ambulatory

## 2014-10-13 ENCOUNTER — Other Ambulatory Visit: Payer: Self-pay | Admitting: Hematology and Oncology

## 2014-10-13 ENCOUNTER — Telehealth: Payer: Self-pay | Admitting: *Deleted

## 2014-10-13 ENCOUNTER — Telehealth: Payer: Self-pay | Admitting: Neurology

## 2014-10-13 NOTE — Telephone Encounter (Signed)
Spoke with daughter, would like to come in tomorrow for labs and see NP.

## 2014-10-13 NOTE — Telephone Encounter (Signed)
At best I can move her to Wednesday. i cannot see her tomorrow. She can either 1) see a NP/Cyndee 2) come in for labs only 3) get the MD at the assisted living to see her

## 2014-10-13 NOTE — Telephone Encounter (Signed)
PT. IS ABLE TO GET AROUND WITH A LITTLE HELP BUT SHE IS VERY TIRED AND WEAK. PT.HAS HAD ONE EPISODE OF DIZZINESS FOR ABOUT 15 SECONDS. NO SHORTNESS OF BREATH. DAUGHTER WAS WONDERING IF DR.GORSUCH COULD SEE HER MOTHER BEFORE HER SCHEDULED APPOINTMENT ON 10/16/14.

## 2014-10-13 NOTE — Telephone Encounter (Signed)
Pt canceled appt and will call back to resch notified referring dr office

## 2014-10-14 ENCOUNTER — Other Ambulatory Visit (HOSPITAL_BASED_OUTPATIENT_CLINIC_OR_DEPARTMENT_OTHER): Payer: Medicare Other

## 2014-10-14 ENCOUNTER — Ambulatory Visit (HOSPITAL_BASED_OUTPATIENT_CLINIC_OR_DEPARTMENT_OTHER): Payer: Medicare Other

## 2014-10-14 ENCOUNTER — Encounter: Payer: Self-pay | Admitting: Nurse Practitioner

## 2014-10-14 ENCOUNTER — Ambulatory Visit (HOSPITAL_BASED_OUTPATIENT_CLINIC_OR_DEPARTMENT_OTHER): Payer: Medicare Other | Admitting: Nurse Practitioner

## 2014-10-14 VITALS — BP 114/61 | HR 66 | Temp 97.9°F | Resp 20 | Wt 112.5 lb

## 2014-10-14 DIAGNOSIS — D46C Myelodysplastic syndrome with isolated del(5q) chromosomal abnormality: Secondary | ICD-10-CM

## 2014-10-14 DIAGNOSIS — E860 Dehydration: Secondary | ICD-10-CM

## 2014-10-14 DIAGNOSIS — R531 Weakness: Secondary | ICD-10-CM | POA: Insufficient documentation

## 2014-10-14 DIAGNOSIS — I951 Orthostatic hypotension: Secondary | ICD-10-CM

## 2014-10-14 LAB — CBC WITH DIFFERENTIAL/PLATELET
BASO%: 3.2 % — ABNORMAL HIGH (ref 0.0–2.0)
Basophils Absolute: 0.2 10*3/uL — ABNORMAL HIGH (ref 0.0–0.1)
EOS ABS: 0.2 10*3/uL (ref 0.0–0.5)
EOS%: 3.1 % (ref 0.0–7.0)
HEMATOCRIT: 27.8 % — AB (ref 34.8–46.6)
HEMOGLOBIN: 8.9 g/dL — AB (ref 11.6–15.9)
LYMPH#: 2.4 10*3/uL (ref 0.9–3.3)
LYMPH%: 41.8 % (ref 14.0–49.7)
MCH: 31.4 pg (ref 25.1–34.0)
MCHC: 32 g/dL (ref 31.5–36.0)
MCV: 98.1 fL (ref 79.5–101.0)
MONO#: 0 10*3/uL — AB (ref 0.1–0.9)
MONO%: 0.2 % (ref 0.0–14.0)
NEUT%: 51.7 % (ref 38.4–76.8)
NEUTROS ABS: 2.9 10*3/uL (ref 1.5–6.5)
PLATELETS: 88 10*3/uL — AB (ref 145–400)
RBC: 2.84 10*6/uL — ABNORMAL LOW (ref 3.70–5.45)
RDW: 20.6 % — ABNORMAL HIGH (ref 11.2–14.5)
WBC: 5.7 10*3/uL (ref 3.9–10.3)

## 2014-10-14 LAB — COMPREHENSIVE METABOLIC PANEL (CC13)
ALT: 8 U/L (ref 0–55)
ANION GAP: 12 meq/L — AB (ref 3–11)
AST: 18 U/L (ref 5–34)
Albumin: 3.4 g/dL — ABNORMAL LOW (ref 3.5–5.0)
Alkaline Phosphatase: 67 U/L (ref 40–150)
BUN: 23.8 mg/dL (ref 7.0–26.0)
CALCIUM: 8.6 mg/dL (ref 8.4–10.4)
CHLORIDE: 105 meq/L (ref 98–109)
CO2: 23 mEq/L (ref 22–29)
Creatinine: 0.8 mg/dL (ref 0.6–1.1)
EGFR: 71 mL/min/{1.73_m2} — AB (ref >90)
Glucose: 159 mg/dl — ABNORMAL HIGH (ref 70–140)
POTASSIUM: 4.4 meq/L (ref 3.5–5.1)
Sodium: 140 mEq/L (ref 136–145)
Total Bilirubin: 0.48 mg/dL (ref 0.20–1.20)
Total Protein: 6.6 g/dL (ref 6.4–8.3)

## 2014-10-14 LAB — URINALYSIS, MICROSCOPIC - CHCC
Bilirubin (Urine): NEGATIVE
Blood: NEGATIVE
GLUCOSE UR CHCC: NEGATIVE mg/dL
Ketones: NEGATIVE mg/dL
Leukocyte Esterase: NEGATIVE
NITRITE: NEGATIVE
PROTEIN: NEGATIVE mg/dL
RBC / HPF: NEGATIVE (ref 0–2)
Specific Gravity, Urine: 1.015 (ref 1.003–1.035)
UROBILINOGEN UR: 0.2 mg/dL (ref 0.2–1)
pH: 6 (ref 4.6–8.0)

## 2014-10-14 LAB — TECHNOLOGIST REVIEW

## 2014-10-14 LAB — HOLD TUBE, BLOOD BANK

## 2014-10-14 MED ORDER — SODIUM CHLORIDE 0.9 % IV SOLN
INTRAVENOUS | Status: AC
  Administered 2014-10-14: 14:00:00 via INTRAVENOUS

## 2014-10-14 NOTE — Progress Notes (Signed)
 SYMPTOM MANAGEMENT CLINIC   HPI: Tonya Mckee 81 y.o. female diagnosed with myelodysplastic syndrome.  Patient has been holding her oral Revlimid therapy since her recent admission secondary to a fall.  She continues taking prednisone on a daily basis.  Patient called the cancer Center today requesting urgent care visit.  Patient reports a one-week history of dizziness with sudden position changes and overall progressive weakness.  She states she visited with her pulmonologist Dr. Wert last week; and he changed her lisinopril to Diovan at that time.  Patient reports that the lisinopril was causing an occasional mild cough.  She does admit to probable poor oral intake; and does feel slightly dehydrated today.  She denies any chest pain, chest pressure, shortness breath, or pain with inspiration.  She also denies any nausea, vomiting, diarrhea, constipation.  She denies any recent fevers or chills.  Patient fell on 09/13/2014 and sustained L1-L3 transverse process fractures.    She has been living at the [facility] assisted living facility since discharge from the hospital following her fall.  She reports receiving cycle therapy while at the assisted living facility.  HPI  ROS  Past Medical History  Diagnosis Date  . COPD (chronic obstructive pulmonary disease)   . Hypertension   . PONV (postoperative nausea and vomiting)   . Dysrhythmia     rapid heart rate  . Stroke 2010    mini stroke  . Pneumonia 2009  . Shortness of breath   . GERD (gastroesophageal reflux disease)     long time ago  . Depression   . DEMENTIA     borderline  . Depression 01/02/2012  . Anemia 01/02/2012  . Artery stenosis 10/30/12    Moderate to severe left extracranial vertebral  . Cancer 20years ago+ 1999    lt breast  left  . SCC (squamous cell carcinoma), leg     Right  LE Dr. Hall  . Arthritis     Osteo-  Spine and Knees  . Vitamin B12 deficiency   . Vertigo, benign positional   . Memory loss     . Blepharospasm     Left eye  . Venous stasis ulcers 2012    Left LE due to trauma  . Cellulitis Jan 01, 2012    Left UE  . Carotid artery occlusion     Bruit- RIGHT  . Occlusion and stenosis of carotid artery without mention of cerebral infarction 11/06/2012  . Persistent headaches 02/26/2014  . Chronic respiratory failure     on NCO2     Past Surgical History  Procedure Laterality Date  . Abdominal hysterectomy    . Appendectomy    . Breast surgery    . Joint replacement      Lt arm/wrist plates and screws  . Eye surgery      L/R catarects    has Essential hypertension; Depression; COPD GOLD II; MDS (myelodysplastic syndrome) with 5q deletion; Occlusion and stenosis of carotid artery without mention of cerebral infarction; Dizziness and giddiness; Tobacco abuse; Persistent headaches; Anemia in neoplastic disease; Hypotension; Acute gastroenteritis; Elevated transaminase level; Hyponatremia; Hypokalemia; Abnormal LFTs (liver function tests); Chronic respiratory failure; Thrombocytopenia due to drugs; Other constipation; Fall; Fracture of lumbar spine L 1-3 transverse process; Lumbar transverse process fracture; Orthostatic hypotension; Weakness; and Dehydration on her problem list.    is allergic to donepezil; penicillins; amoxapine and related; doxycycline; keflex; bupropion; ketek; levaquin; sulfa antibiotics; tramadol; and zyrtec.    Medication List         This list is accurate as of: 10/14/14 11:59 PM.  Always use your most recent med list.               acetaminophen 325 MG tablet  Commonly known as:  TYLENOL  Take 650 mg by mouth every 6 (six) hours as needed for moderate pain (pain).     albuterol 108 (90 BASE) MCG/ACT inhaler  Commonly known as:  PROVENTIL HFA;VENTOLIN HFA  Inhale 2 puffs into the lungs every 6 (six) hours as needed for wheezing or shortness of breath (wheezing).     butalbital-acetaminophen-caffeine 50-325-40 MG per tablet  Commonly known as:   FIORICET  Take 1 tablet by mouth every 6 (six) hours as needed for headache.     calcium carbonate 500 MG chewable tablet  Commonly known as:  TUMS - dosed in mg elemental calcium  Chew 1 tablet by mouth daily as needed for indigestion or heartburn.     chlorhexidine 0.12 % solution  Commonly known as:  PERIDEX  15 mLs by Mouth Rinse route 2 (two) times daily.     cyclobenzaprine 5 MG tablet  Commonly known as:  FLEXERIL  Take 1 tablet (5 mg total) by mouth 3 (three) times daily.     famotidine 10 MG tablet  Commonly known as:  PEPCID AC  Take 1 tablet (10 mg total) by mouth 2 (two) times daily.     FLUoxetine 10 MG capsule  Commonly known as:  PROZAC  Take 3 capsules (30 mg total) by mouth every morning.     fluticasone 50 MCG/ACT nasal spray  Commonly known as:  FLONASE  Place 1 spray into the nose daily as needed for allergies (allergies).     HYDROcodone-acetaminophen 5-325 MG per tablet  Commonly known as:  NORCO/VICODIN  Take 1-2 tablets by mouth every 4 (four) hours as needed for moderate pain or severe pain.     ipratropium-albuterol 0.5-2.5 (3) MG/3ML Soln  Commonly known as:  DUONEB  Take 3 mLs by nebulization 2 (two) times daily as needed (wheezing).     lenalidomide 2.5 MG capsule  Commonly known as:  REVLIMID  Take 1 capsule (2.5 mg total) by mouth daily. For 28 days. Faxed to [employer]. Auth # [id number]     LORazepam 0.5 MG tablet  Commonly known as:  ATIVAN  Take 1 tablet (0.5 mg total) by mouth every 8 (eight) hours as needed for anxiety (anxiety).     menthol-cetylpyridinium 3 MG lozenge  Commonly known as:  CEPACOL  Take 1 lozenge (3 mg total) by mouth as needed for sore throat.     metoprolol succinate 50 MG 24 hr tablet  Commonly known as:  TOPROL-XL  Take 50 mg by mouth every evening. Take with or immediately following a meal.     ondansetron 4 MG tablet  Commonly known as:  ZOFRAN  Take 1 tablet (4 mg total) by mouth every 6 (six) hours as  needed for nausea.     polyethylene glycol packet  Commonly known as:  MIRALAX / GLYCOLAX  Take 17 g by mouth daily.     predniSONE 2.5 MG tablet  Commonly known as:  DELTASONE  Take 1 tablet (2.5 mg total) by mouth daily with breakfast.     SALONPAS EX  Apply 1 patch topically daily.     valsartan 160 MG tablet  Commonly known as:  DIOVAN  Take 1 tablet (160 mg total) by mouth daily.     Vitamin   D 2000 UNITS tablet  Take 2,000 Units by mouth daily.         PHYSICAL EXAMINATION  Blood pressure 114/61, pulse 66, temperature 97.9 F (36.6 C), resp. rate 20, weight 112 lb 8 oz (51.03 kg), SpO2 96 %.  Physical Exam  Constitutional: She is oriented to person, place, and time.  Patient appears frail, fatigued, and weak on exam.  HENT:  Head: Normocephalic and atraumatic.  Mouth/Throat: Oropharynx is clear and moist.  Eyes: Conjunctivae and EOM are normal. Pupils are equal, round, and reactive to light. Right eye exhibits no discharge. Left eye exhibits no discharge. No scleral icterus.  Neck: Normal range of motion. Neck supple. No JVD present. No tracheal deviation present. No thyromegaly present.  Cardiovascular: Normal rate, regular rhythm, normal heart sounds and intact distal pulses.   Pulmonary/Chest: Effort normal and breath sounds normal. No respiratory distress. She has no wheezes. She has no rales. She exhibits no tenderness.  Abdominal: Soft. Bowel sounds are normal. She exhibits no distension and no mass. There is no tenderness. There is no rebound and no guarding.  Musculoskeletal: Normal range of motion. She exhibits no edema or tenderness.  Lymphadenopathy:    She has no cervical adenopathy.  Neurological: She is alert and oriented to person, place, and time.  Patient in a wheelchair on exam today.  Skin: Skin is warm and dry. No rash noted. No erythema. There is pallor.  Psychiatric: Affect normal.  Nursing note and vitals reviewed.   LABORATORY  DATA:. Appointment on 10/14/2014  Component Date Value Ref Range Status  . WBC 10/14/2014 5.7  3.9 - 10.3 10e3/uL Final  . NEUT# 10/14/2014 2.9  1.5 - 6.5 10e3/uL Final  . HGB 10/14/2014 8.9* 11.6 - 15.9 g/dL Final  . HCT 10/14/2014 27.8* 34.8 - 46.6 % Final  . Platelets 10/14/2014 88* 145 - 400 10e3/uL Final  . MCV 10/14/2014 98.1  79.5 - 101.0 fL Final  . MCH 10/14/2014 31.4  25.1 - 34.0 pg Final  . MCHC 10/14/2014 32.0  31.5 - 36.0 g/dL Final  . RBC 10/14/2014 2.84* 3.70 - 5.45 10e6/uL Final  . RDW 10/14/2014 20.6* 11.2 - 14.5 % Final  . lymph# 10/14/2014 2.4  0.9 - 3.3 10e3/uL Final  . MONO# 10/14/2014 0.0* 0.1 - 0.9 10e3/uL Final  . Eosinophils Absolute 10/14/2014 0.2  0.0 - 0.5 10e3/uL Final  . Basophils Absolute 10/14/2014 0.2* 0.0 - 0.1 10e3/uL Final  . NEUT% 10/14/2014 51.7  38.4 - 76.8 % Final  . LYMPH% 10/14/2014 41.8  14.0 - 49.7 % Final  . MONO% 10/14/2014 0.2  0.0 - 14.0 % Final  . EOS% 10/14/2014 3.1  0.0 - 7.0 % Final  . BASO% 10/14/2014 3.2* 0.0 - 2.0 % Final  . Hold Tube, Blood Bank 10/14/2014 Blood Bank Order Cancelled   Final  . Technologist Review 10/14/2014 few Metas and Myelocytes present   Final  . Sodium 10/14/2014 140  136 - 145 mEq/L Final  . Potassium 10/14/2014 4.4  3.5 - 5.1 mEq/L Final  . Chloride 10/14/2014 105  98 - 109 mEq/L Final  . CO2 10/14/2014 23  22 - 29 mEq/L Final  . Glucose 10/14/2014 159* 70 - 140 mg/dl Final  . BUN 10/14/2014 23.8  7.0 - 26.0 mg/dL Final  . Creatinine 10/14/2014 0.8  0.6 - 1.1 mg/dL Final  . Total Bilirubin 10/14/2014 0.48  0.20 - 1.20 mg/dL Final  . Alkaline Phosphatase 10/14/2014 67  40 - 150 U/L   Final  . AST 10/14/2014 18  5 - 34 U/L Final  . ALT 10/14/2014 8  0 - 55 U/L Final  . Total Protein 10/14/2014 6.6  6.4 - 8.3 g/dL Final  . Albumin 10/14/2014 3.4* 3.5 - 5.0 g/dL Final  . Calcium 10/14/2014 8.6  8.4 - 10.4 mg/dL Final  . Anion Gap 10/14/2014 12* 3 - 11 mEq/L Final  . EGFR 10/14/2014 71* >90 ml/min/1.73 m2  Final   eGFR is calculated using the CKD-EPI Creatinine Equation (2009)  . Glucose 10/14/2014 Negative  Negative mg/dL Final  . Bilirubin (Urine) 10/14/2014 Negative  Negative Final  . Ketones 10/14/2014 Negative  Negative mg/dL Final  . Specific Gravity, Urine 10/14/2014 1.015  1.003 - 1.035 Final  . Blood 10/14/2014 Negative  Negative Final  . pH 10/14/2014 6.0  4.6 - 8.0 Final  . Protein 10/14/2014 Negative  Negative- <30 mg/dL Final  . Urobilinogen, UR 10/14/2014 0.2  0.2 - 1 mg/dL Final  . Nitrite 10/14/2014 Negative  Negative Final  . Leukocyte Esterase 10/14/2014 Negative  Negative Final  . RBC / HPF 10/14/2014 Negative  0 - 2 Final  . WBC, UA 10/14/2014 0-2  0 - 2 Final  . Bacteria, UA 10/14/2014 Trace  Negative- Trace Final  . Epithelial Cells 10/14/2014 Few  Negative- Few Final     RADIOGRAPHIC STUDIES: No results found.  ASSESSMENT/PLAN:    Dehydration Patient states that she has been trying to push fluids; but does feel dehydrated today.  She also continues to complain of progressive weakness.  Patient to receive 1 L normal saline IV fluid rehydration while at the cancer Center today.  Patient was also encouraged to push fluids is much as possible; eat multiple small meals throughout the day.   MDS (myelodysplastic syndrome) with 5q deletion Patient has remained off the oral Revlimid therapy since her fall and admission to the hospital.  She continues at a steady dose of prednisone at 2.5 mg on a daily basis.  Patient is questioning if she may be able to discontinue the prednisone at this time.  Hemoglobin is 8.9 and platelet count is 88 today.  Patient denies any worsening issues with either easy bleeding or bruising.  She is scheduled for labs and a follow-up visit with Dr. Gorsuch on 10/16/2014. Advised pt to continue with the prednisone until she returns this coming Thursday 10/16/14 for a follow up visit with Dr. Gorsuch.    Orthostatic hypotension Patient is  complaining of mild dizziness with sudden position changes since she has switched from the lisinopril to the Diovan within the past week per her pulmonologist Dr. Wert.  Per patient-the lisinopril was discontinued due to an intermittent, chronic cough.  On exam-sitting blood pressure was 126/54 with a heart rate of 61; and a standing blood pressure was 114/61 with a heart rate of 66.  HGB has decreased from 9.2 down to 8.9.  Patient does appear to be mildly orthostatic on exam today.  After discussion with both patient and her family-the plan is for the patient to return to the cancer Center for labs and a follow-up visit this coming Thursday, 10/16/2014.  She'll let us know at that time if the IV fluid rehydration helped overall; or whether we should consider switching back to the lisinopril from the Diovan.  Patient was encouraged to change positions slowly to prevent any further falls.   Weakness Patient is complaining of new onset dizziness with sudden position changes; and progressive weakness within this   last week-since she just switched from the lisinopril to the Diovan per her pulmonologist Dr. Melvyn Novas.  She denies any chest pain, chest pressure, shortness of breath, or pain with inspiration.  She denies any recent nausea, vomiting, diarrhea, or constipation.  She denies any fever or chills.  Patient does admit to probable poor oral intake for the colon and does feel slightly dehydrated today.  Will give patient 1 L normal saline IV fluid rehydration in hopes that we will help her to feel better.      Patient stated understanding of all instructions; and was in agreement with this plan of care. The patient knows to call the clinic with any problems, questions or concerns.   Review/collaboration with Dr.  Alvy Bimler regarding all aspects of patient's visit today.   Total time spent with patient was 40 minutes;  with greater than 75 percent of that time spent in face to face counseling  regarding patient's symptoms,  and coordination of care and follow up.  Disclaimer: This note was dictated with voice recognition software. Similar sounding words can inadvertently be transcribed and may not be corrected upon review.   Drue Second, NP 10/15/2014

## 2014-10-14 NOTE — Assessment & Plan Note (Addendum)
Patient has remained off the oral Revlimid therapy since her fall and admission to the hospital.  She continues at a steady dose of prednisone at 2.5 mg on a daily basis.  Patient is questioning if she may be able to discontinue the prednisone at this time.  Hemoglobin is 8.9 and platelet count is 88 today.  Patient denies any worsening issues with either easy bleeding or bruising.  She is scheduled for labs and a follow-up visit with Dr. Alvy Bimler on 10/16/2014. Advised pt to continue with the prednisone until she returns this coming Thursday 10/16/14 for a follow up visit with Dr. Alvy Bimler.

## 2014-10-14 NOTE — Assessment & Plan Note (Addendum)
Patient is complaining of new onset dizziness with sudden position changes; and progressive weakness within this last week-since she just switched from the lisinopril to the Diovan per her pulmonologist Dr. Melvyn Novas.  She denies any chest pain, chest pressure, shortness of breath, or pain with inspiration.  She denies any recent nausea, vomiting, diarrhea, or constipation.  She denies any fever or chills.  Patient does admit to probable poor oral intake for the colon and does feel slightly dehydrated today.  Will give patient 1 L normal saline IV fluid rehydration in hopes that we will help her to feel better.

## 2014-10-14 NOTE — Assessment & Plan Note (Signed)
Patient states that she has been trying to push fluids; but does feel dehydrated today.  She also continues to complain of progressive weakness.  Patient to receive 1 L normal saline IV fluid rehydration while at the cancer Center today.  Patient was also encouraged to push fluids is much as possible; eat multiple small meals throughout the day.

## 2014-10-14 NOTE — Patient Instructions (Signed)
Dehydration, Adult Dehydration is when you lose more fluids from the body than you take in. Vital organs like the kidneys, brain, and heart cannot function without a proper amount of fluids and salt. Any loss of fluids from the body can cause dehydration.  CAUSES   Vomiting.  Diarrhea.  Excessive sweating.  Excessive urine output.  Fever. SYMPTOMS  Mild dehydration  Thirst.  Dry lips.  Slightly dry mouth. Moderate dehydration  Very dry mouth.  Sunken eyes.  Skin does not bounce back quickly when lightly pinched and released.  Dark urine and decreased urine production.  Decreased tear production.  Headache. Severe dehydration  Very dry mouth.  Extreme thirst.  Rapid, weak pulse (more than 100 beats per minute at rest).  Cold hands and feet.  Not able to sweat in spite of heat and temperature.  Rapid breathing.  Blue lips.  Confusion and lethargy.  Difficulty being awakened.  Minimal urine production.  No tears. DIAGNOSIS  Your caregiver will diagnose dehydration based on your symptoms and your exam. Blood and urine tests will help confirm the diagnosis. The diagnostic evaluation should also identify the cause of dehydration. TREATMENT  Treatment of mild or moderate dehydration can often be done at home by increasing the amount of fluids that you drink. It is best to drink small amounts of fluid more often. Drinking too much at one time can make vomiting worse. Refer to the home care instructions below. Severe dehydration needs to be treated at the hospital where you will probably be given intravenous (IV) fluids that contain water and electrolytes. HOME CARE INSTRUCTIONS   Ask your caregiver about specific rehydration instructions.  Drink enough fluids to keep your urine clear or pale yellow.  Drink small amounts frequently if you have nausea and vomiting.  Eat as you normally do.  Avoid:  Foods or drinks high in sugar.  Carbonated  drinks.  Juice.  Extremely hot or cold fluids.  Drinks with caffeine.  Fatty, greasy foods.  Alcohol.  Tobacco.  Overeating.  Gelatin desserts.  Wash your hands well to avoid spreading bacteria and viruses.  Only take over-the-counter or prescription medicines for pain, discomfort, or fever as directed by your caregiver.  Ask your caregiver if you should continue all prescribed and over-the-counter medicines.  Keep all follow-up appointments with your caregiver. SEEK MEDICAL CARE IF:  You have abdominal pain and it increases or stays in one area (localizes).  You have a rash, stiff neck, or severe headache.  You are irritable, sleepy, or difficult to awaken.  You are weak, dizzy, or extremely thirsty. SEEK IMMEDIATE MEDICAL CARE IF:   You are unable to keep fluids down or you get worse despite treatment.  You have frequent episodes of vomiting or diarrhea.  You have blood or green matter (bile) in your vomit.  You have blood in your stool or your stool looks black and tarry.  You have not urinated in 6 to 8 hours, or you have only urinated a small amount of very dark urine.  You have a fever.  You faint. MAKE SURE YOU:   Understand these instructions.  Will watch your condition.  Will get help right away if you are not doing well or get worse. Document Released: 08/01/2005 Document Revised: 10/24/2011 Document Reviewed: 03/21/2011 ExitCare Patient Information 2015 [employer]. This information is not intended to replace advice given to you by your health care provider. Make sure you discuss any questions you have with your health care   provider.  

## 2014-10-14 NOTE — Assessment & Plan Note (Addendum)
Patient is complaining of mild dizziness with sudden position changes since she has switched from the lisinopril to the Diovan within the past week per her pulmonologist Dr. Melvyn Novas.  Per patient-the lisinopril was discontinued due to an intermittent, chronic cough.  On exam-sitting blood pressure was 126/54 with a heart rate of 61; and a standing blood pressure was 114/61 with a heart rate of 66.  HGB has decreased from 9.2 down to 8.9.  Patient does appear to be mildly orthostatic on exam today.  After discussion with both patient and her family-the plan is for the patient to return to the [facility] for labs and a follow-up visit this coming Thursday, 10/16/2014.  She'll let us know at that time if the IV fluid rehydration helped overall; or whether we should consider switching back to the lisinopril from the Diovan.  Patient was encouraged to change positions slowly to prevent any further falls.

## 2014-10-15 ENCOUNTER — Ambulatory Visit: Payer: Medicare Other | Admitting: Neurology

## 2014-10-15 LAB — URINE CULTURE

## 2014-10-16 ENCOUNTER — Ambulatory Visit (HOSPITAL_BASED_OUTPATIENT_CLINIC_OR_DEPARTMENT_OTHER): Payer: Medicare Other | Admitting: Hematology and Oncology

## 2014-10-16 ENCOUNTER — Telehealth: Payer: Self-pay | Admitting: Hematology and Oncology

## 2014-10-16 ENCOUNTER — Other Ambulatory Visit (HOSPITAL_BASED_OUTPATIENT_CLINIC_OR_DEPARTMENT_OTHER): Payer: Medicare Other

## 2014-10-16 ENCOUNTER — Encounter: Payer: Self-pay | Admitting: Hematology and Oncology

## 2014-10-16 VITALS — BP 131/58 | HR 57 | Temp 97.3°F | Resp 18 | Ht 62.0 in | Wt 113.9 lb

## 2014-10-16 DIAGNOSIS — D46C Myelodysplastic syndrome with isolated del(5q) chromosomal abnormality: Secondary | ICD-10-CM

## 2014-10-16 DIAGNOSIS — D6959 Other secondary thrombocytopenia: Secondary | ICD-10-CM

## 2014-10-16 DIAGNOSIS — D630 Anemia in neoplastic disease: Secondary | ICD-10-CM

## 2014-10-16 DIAGNOSIS — I9589 Other hypotension: Secondary | ICD-10-CM

## 2014-10-16 DIAGNOSIS — T50905A Adverse effect of unspecified drugs, medicaments and biological substances, initial encounter: Secondary | ICD-10-CM

## 2014-10-16 LAB — CBC WITH DIFFERENTIAL/PLATELET
BASO%: 0.2 % (ref 0.0–2.0)
Basophils Absolute: 0 10*3/uL (ref 0.0–0.1)
EOS%: 1.5 % (ref 0.0–7.0)
Eosinophils Absolute: 0.1 10*3/uL (ref 0.0–0.5)
HEMATOCRIT: 28 % — AB (ref 34.8–46.6)
HEMOGLOBIN: 9.1 g/dL — AB (ref 11.6–15.9)
LYMPH%: 33.3 % (ref 14.0–49.7)
MCH: 31.7 pg (ref 25.1–34.0)
MCHC: 32.5 g/dL (ref 31.5–36.0)
MCV: 97.6 fL (ref 79.5–101.0)
MONO#: 0.6 10*3/uL (ref 0.1–0.9)
MONO%: 10.6 % (ref 0.0–14.0)
NEUT%: 54.4 % (ref 38.4–76.8)
NEUTROS ABS: 2.9 10*3/uL (ref 1.5–6.5)
PLATELETS: 76 10*3/uL — AB (ref 145–400)
RBC: 2.87 10*6/uL — ABNORMAL LOW (ref 3.70–5.45)
RDW: 18.9 % — AB (ref 11.2–14.5)
WBC: 5.4 10*3/uL (ref 3.9–10.3)
lymph#: 1.8 10*3/uL (ref 0.9–3.3)
nRBC: 0 % (ref 0–0)

## 2014-10-16 LAB — TECHNOLOGIST REVIEW

## 2014-10-16 LAB — HOLD TUBE, BLOOD BANK

## 2014-10-16 NOTE — Assessment & Plan Note (Signed)
She had recent hypotension, responded well to a liter IV fluids. I recommend she discuss with PCP about potential discontinuation of Diovan as discrete cause hypotension. Otherwise, defer to her primary doctor for management. Her blood pressure today is satisfactory.

## 2014-10-16 NOTE — Telephone Encounter (Signed)
gave relative avs report and appts for april

## 2014-10-16 NOTE — Assessment & Plan Note (Signed)
Patient has remained off the oral Revlimid therapy since her fall and admission to the hospital.  She continues at a steady dose of prednisone at 2.5 mg on a daily basis. Plan to continue to hold until she gets discharged from the nursing home next week. I will plan to see her back a month from now for further review blood work.

## 2014-10-16 NOTE — Progress Notes (Signed)
[place] OFFICE PROGRESS NOTE  Patient Care Team: Carlos Levering, PA-C as PCP - General (Family Medicine) Lennon Alstrom, MD (Neurology) Carlos Levering, PA-C as Referring Physician (Family Medicine) Heath Lark, MD as Consulting Physician (Hematology and Oncology)  SUMMARY OF ONCOLOGIC HISTORY:  She was found to have abnormal CBC from routine blood work. Starting around 2012, she has progressive anemia with hemoglobin and the lowest at 9.1.  She denies recent chest pain on exertion but complains of significant shortness of breath on minimal exertion. She has had pre-syncopal episodes and palpitations. She has profound fatigue and takes frequent naps. Recently, she had a fall.  Of note, the patient has 60 pack plus year history and recently was placed on oxygen therapy.  She never has EGD or screening colonoscopy.  She has been taking vitamin B12 supplements for a long time.  She has remote history of left breast cancer detected from mammogram. According to the patient, she had lumpectomy followed by radiation followed by tamoxifen. She does not know the stage of disease but she does not think the lymph nodes were involved.  The patient was prescribed oral iron supplements and she takes them regularly.  On 12/27/13: Bone marrow biopsy did not show evidence of leukemia or metastatic cancer. Cytogenetics and FISH analysis confirm myelodysplastic syndrome with deletion 5Q. The patient was started on erythropoietin stimulating agent to keep hemoglobin greater than 10 g. On 04/15/14: CT head was negative On 04/24/2014, we discontinued darbepoetin as the patient complained of side effects. She was started on prednisone 10 mg daily. On 05/22/14: Prednisone is tapered to 5 mg. On 06/16/2014, she started on Revlimid 2.5 mg daily. From 09/15/2014 to 09/17/2014, she was hospitalized due to acute fall. She was subsequently discharged to rehabilitation facility and her treatment was placed on  hold. On 10/14/2014, she received 1 L IV fluids due to weakness and hypotension  INTERVAL HISTORY: Please see below for problem oriented charting. She feels better after recent IV fluids. She continued to feel weak. The patient denies any recent signs or symptoms of bleeding such as spontaneous epistaxis, hematuria or hematochezia.   REVIEW OF SYSTEMS:   Constitutional: Denies fevers, chills or abnormal weight loss Eyes: Denies blurriness of vision Ears, nose, mouth, throat, and face: Denies mucositis or sore throat Respiratory: Denies cough, dyspnea or wheezes Cardiovascular: Denies palpitation, chest discomfort or lower extremity swelling Gastrointestinal:  Denies nausea, heartburn or change in bowel habits Skin: Denies abnormal skin rashes Lymphatics: Denies new lymphadenopathy or easy bruising Behavioral/Psych: Mood is stable, no new changes  All other systems were reviewed with the patient and are negative.  I have reviewed the past medical history, past surgical history, social history and family history with the patient and they are unchanged from previous note.  ALLERGIES:  is allergic to donepezil; penicillins; amoxapine and related; doxycycline; keflex; bupropion; ketek; levaquin; sulfa antibiotics; tramadol; and zyrtec.  MEDICATIONS:  Current Outpatient Prescriptions  Medication Sig Dispense Refill  . acetaminophen (TYLENOL) 325 MG tablet Take 650 mg by mouth every 6 (six) hours as needed for moderate pain (pain).     Marland Kitchen albuterol (PROVENTIL HFA;VENTOLIN HFA) 108 (90 BASE) MCG/ACT inhaler Inhale 2 puffs into the lungs every 6 (six) hours as needed for wheezing or shortness of breath (wheezing).     . calcium carbonate (TUMS - DOSED IN MG ELEMENTAL CALCIUM) 500 MG chewable tablet Chew 1 tablet by mouth daily as needed for indigestion or heartburn.    Marland Kitchen  chlorhexidine (PERIDEX) 0.12 % solution 15 mLs by Mouth Rinse route 2 (two) times daily. 120 mL 0  . Cholecalciferol (VITAMIN  D) 2000 UNITS tablet Take 2,000 Units by mouth daily.    . famotidine (PEPCID AC) 10 MG tablet Take 1 tablet (10 mg total) by mouth 2 (two) times daily. 30 tablet 0  . FLUoxetine (PROZAC) 10 MG capsule Take 3 capsules (30 mg total) by mouth every morning. 30 capsule 0  . fluticasone (FLONASE) 50 MCG/ACT nasal spray Place 1 spray into the nose daily as needed for allergies (allergies).     Marland Kitchen ipratropium-albuterol (DUONEB) 0.5-2.5 (3) MG/3ML SOLN Take 3 mLs by nebulization 2 (two) times daily as needed (wheezing). 360 mL 0  . lenalidomide (REVLIMID) 2.5 MG capsule Take 1 capsule (2.5 mg total) by mouth daily. For 28 days. Faxed to [facility]. Auth # [id number] 28 capsule 0  . Liniments (SALONPAS EX) Apply 1 patch topically daily.    Marland Kitchen LORazepam (ATIVAN) 0.5 MG tablet Take 1 tablet (0.5 mg total) by mouth every 8 (eight) hours as needed for anxiety (anxiety). 30 tablet 0  . menthol-cetylpyridinium (CEPACOL) 3 MG lozenge Take 1 lozenge (3 mg total) by mouth as needed for sore throat. 30 tablet 0  . metoprolol succinate (TOPROL-XL) 50 MG 24 hr tablet Take 50 mg by mouth every evening. Take with or immediately following a meal.    . ondansetron (ZOFRAN) 4 MG tablet Take 1 tablet (4 mg total) by mouth every 6 (six) hours as needed for nausea. 20 tablet 0  . polyethylene glycol (MIRALAX / GLYCOLAX) packet Take 17 g by mouth daily. 14 each 0  . predniSONE (DELTASONE) 2.5 MG tablet Take 1 tablet (2.5 mg total) by mouth daily with breakfast. 30 tablet 3  . valsartan (DIOVAN) 160 MG tablet Take 1 tablet (160 mg total) by mouth daily. 30 tablet 11   No current facility-administered medications for this visit.    PHYSICAL EXAMINATION: ECOG PERFORMANCE STATUS: 2 - Symptomatic, <50% confined to bed  Filed Vitals:   10/16/14 1255  BP: 131/58  Pulse: 57  Temp: 97.3 F (36.3 C)  Resp: 18   Filed Weights   10/16/14 1255  Weight: 113 lb 14.4 oz (51.665 kg)    GENERAL:alert, no distress and comfortable. She  looks elderly. Limited examination due to thoracic brace SKIN: skin color, texture, turgor are normal, no rashes or significant lesions EYES: normal, Conjunctiva are pale and non-injected, sclera clear Musculoskeletal:no cyanosis of digits and no clubbing  NEURO: alert & oriented x 3 with fluent speech, no focal motor/sensory deficits  LABORATORY DATA:  I have reviewed the data as listed    Component Value Date/Time   NA 140 10/14/2014 1210   NA 136 09/16/2014 0500   K 4.4 10/14/2014 1210   K 3.7 09/16/2014 0500   CL 102 09/16/2014 0500   CO2 23 10/14/2014 1210   CO2 25 09/16/2014 0500   GLUCOSE 159* 10/14/2014 1210   GLUCOSE 103* 09/16/2014 0500   BUN 23.8 10/14/2014 1210   BUN 11 09/16/2014 0500   CREATININE 0.8 10/14/2014 1210   CREATININE 0.62 09/16/2014 0500   CALCIUM 8.6 10/14/2014 1210   CALCIUM 7.8* 09/16/2014 0500   PROT 6.6 10/14/2014 1210   PROT 6.9 09/15/2014 1331   ALBUMIN 3.4* 10/14/2014 1210   ALBUMIN 3.4* 09/15/2014 1331   AST 18 10/14/2014 1210   AST 15 09/15/2014 1331   ALT 8 10/14/2014 1210   ALT 8 09/15/2014 1331  ALKPHOS 67 10/14/2014 1210   ALKPHOS 65 09/15/2014 1331   BILITOT 0.48 10/14/2014 1210   BILITOT 0.8 09/15/2014 1331   GFRNONAA 81* 09/16/2014 0500   GFRAA >90 09/16/2014 0500    No results found for: SPEP, UPEP  Lab Results  Component Value Date   WBC 5.4 10/16/2014   NEUTROABS 2.9 10/16/2014   HGB 9.1* 10/16/2014   HCT 28.0* 10/16/2014   MCV 97.6 10/16/2014   PLT 76* 10/16/2014      Chemistry      Component Value Date/Time   NA 140 10/14/2014 1210   NA 136 09/16/2014 0500   K 4.4 10/14/2014 1210   K 3.7 09/16/2014 0500   CL 102 09/16/2014 0500   CO2 23 10/14/2014 1210   CO2 25 09/16/2014 0500   BUN 23.8 10/14/2014 1210   BUN 11 09/16/2014 0500   CREATININE 0.8 10/14/2014 1210   CREATININE 0.62 09/16/2014 0500      Component Value Date/Time   CALCIUM 8.6 10/14/2014 1210   CALCIUM 7.8* 09/16/2014 0500   ALKPHOS 67  10/14/2014 1210   ALKPHOS 65 09/15/2014 1331   AST 18 10/14/2014 1210   AST 15 09/15/2014 1331   ALT 8 10/14/2014 1210   ALT 8 09/15/2014 1331   BILITOT 0.48 10/14/2014 1210   BILITOT 0.8 09/15/2014 1331      ASSESSMENT & PLAN:  MDS (myelodysplastic syndrome) with 5q deletion Patient has remained off the oral Revlimid therapy since her fall and admission to the hospital.  She continues at a steady dose of prednisone at 2.5 mg on a daily basis. Plan to continue to hold until she gets discharged from the nursing home next week. I will plan to see her back a month from now for further review blood work.   Anemia in neoplastic disease She is not symptomatic right now. I will give her 2 units of blood transfusion if hemoglobin dropped to less than 8 g. Continue regular blood work monitoring.   Thrombocytopenia due to drugs This is likely due to recent treatment. The patient denies recent history of bleeding such as epistaxis, hematuria or hematochezia. She is asymptomatic from the low platelet count. I will observe for now.    Hypotension She had recent hypotension, responded well to a liter IV fluids. I recommend she discuss with PCP about potential discontinuation of Diovan as discrete cause hypotension. Otherwise, defer to her primary doctor for management. Her blood pressure today is satisfactory.    No orders of the defined types were placed in this encounter.   All questions were answered. The patient knows to call the clinic with any problems, questions or concerns. No barriers to learning was detected. I spent 15 minutes counseling the patient face to face. The total time spent in the appointment was 20 minutes and more than 50% was on counseling and review of test results     [facility], [place], MD 10/16/2014 3:04 PM

## 2014-10-16 NOTE — Assessment & Plan Note (Signed)
This is likely due to recent treatment. The patient denies recent history of bleeding such as epistaxis, hematuria or hematochezia. She is asymptomatic from the low platelet count. I will observe for now.  

## 2014-10-16 NOTE — Assessment & Plan Note (Signed)
She is not symptomatic right now. I will give her 2 units of blood transfusion if hemoglobin dropped to less than 8 g. Continue regular blood work monitoring.

## 2014-11-19 ENCOUNTER — Ambulatory Visit (HOSPITAL_BASED_OUTPATIENT_CLINIC_OR_DEPARTMENT_OTHER): Payer: Medicare Other | Admitting: Hematology and Oncology

## 2014-11-19 ENCOUNTER — Encounter: Payer: Self-pay | Admitting: Hematology and Oncology

## 2014-11-19 ENCOUNTER — Other Ambulatory Visit: Payer: Self-pay | Admitting: Medical Oncology

## 2014-11-19 ENCOUNTER — Telehealth: Payer: Self-pay | Admitting: Medical Oncology

## 2014-11-19 ENCOUNTER — Other Ambulatory Visit (HOSPITAL_BASED_OUTPATIENT_CLINIC_OR_DEPARTMENT_OTHER): Payer: Medicare Other

## 2014-11-19 ENCOUNTER — Ambulatory Visit (HOSPITAL_COMMUNITY)
Admission: RE | Admit: 2014-11-19 | Discharge: 2014-11-19 | Disposition: A | Discharge status: Home / Self Care | Payer: Medicare Other | Source: Ambulatory Visit | Attending: Hematology and Oncology | Admitting: Hematology and Oncology

## 2014-11-19 ENCOUNTER — Encounter (HOSPITAL_COMMUNITY): Payer: Medicare Other

## 2014-11-19 ENCOUNTER — Ambulatory Visit (HOSPITAL_BASED_OUTPATIENT_CLINIC_OR_DEPARTMENT_OTHER): Payer: Medicare Other

## 2014-11-19 ENCOUNTER — Telehealth: Payer: Self-pay | Admitting: Hematology and Oncology

## 2014-11-19 VITALS — BP 127/55 | HR 59 | Temp 97.5°F | Resp 17 | Ht 62.0 in | Wt 111.4 lb

## 2014-11-19 VITALS — BP 170/70 | HR 65 | Temp 98.2°F | Resp 16

## 2014-11-19 DIAGNOSIS — D630 Anemia in neoplastic disease: Secondary | ICD-10-CM | POA: Diagnosis not present

## 2014-11-19 DIAGNOSIS — D46C Myelodysplastic syndrome with isolated del(5q) chromosomal abnormality: Secondary | ICD-10-CM

## 2014-11-19 DIAGNOSIS — D6959 Other secondary thrombocytopenia: Secondary | ICD-10-CM

## 2014-11-19 DIAGNOSIS — T50905A Adverse effect of unspecified drugs, medicaments and biological substances, initial encounter: Secondary | ICD-10-CM

## 2014-11-19 DIAGNOSIS — R519 Headache, unspecified: Secondary | ICD-10-CM

## 2014-11-19 DIAGNOSIS — J449 Chronic obstructive pulmonary disease, unspecified: Secondary | ICD-10-CM

## 2014-11-19 DIAGNOSIS — R51 Headache: Secondary | ICD-10-CM | POA: Diagnosis not present

## 2014-11-19 DIAGNOSIS — D469 Myelodysplastic syndrome, unspecified: Secondary | ICD-10-CM

## 2014-11-19 LAB — MANUAL DIFFERENTIAL
ALC: 4.8 10*3/uL — AB (ref 0.9–3.3)
ANC (CHCC manual diff): 1.6 10*3/uL (ref 1.5–6.5)
BAND NEUTROPHILS: 8 % (ref 0–10)
Basophil: 16 % — ABNORMAL HIGH (ref 0–2)
Blasts: 0 % (ref 0–0)
EOS%: 0 % (ref 0–7)
LYMPH: 57 % — AB (ref 14–49)
MONO: 8 % (ref 0–14)
MYELOCYTES: 0 % (ref 0–0)
Metamyelocytes: 3 % — ABNORMAL HIGH (ref 0–0)
NRBC: 1 % — AB (ref 0–0)
Other Cell: 0 % (ref 0–0)
PLT EST: DECREASED
PROMYELO: 0 % (ref 0–0)
SEG: 8 % — ABNORMAL LOW (ref 38–77)
Variant Lymph: 0 % (ref 0–0)

## 2014-11-19 LAB — CBC WITH DIFFERENTIAL/PLATELET
HCT: 22.6 % — ABNORMAL LOW (ref 34.8–46.6)
HGB: 7 g/dL — ABNORMAL LOW (ref 11.6–15.9)
MCH: 32.7 pg (ref 25.1–34.0)
MCHC: 31.2 g/dL — AB (ref 31.5–36.0)
MCV: 104.8 fL — ABNORMAL HIGH (ref 79.5–101.0)
PLATELETS: 99 10*3/uL — AB (ref 145–400)
RBC: 2.15 10*6/uL — AB (ref 3.70–5.45)
RDW: 24.6 % — AB (ref 11.2–14.5)
WBC: 8.4 10*3/uL (ref 3.9–10.3)

## 2014-11-19 LAB — PREPARE RBC (CROSSMATCH)

## 2014-11-19 LAB — HOLD TUBE, BLOOD BANK

## 2014-11-19 MED ORDER — FUROSEMIDE 10 MG/ML IJ SOLN
INTRAMUSCULAR | Status: AC
  Filled 2014-11-19: qty 2

## 2014-11-19 MED ORDER — ACETAMINOPHEN 325 MG PO TABS
650.0000 mg | ORAL_TABLET | Freq: Once | ORAL | Status: AC
  Administered 2014-11-19: 650 mg via ORAL

## 2014-11-19 MED ORDER — DIPHENHYDRAMINE HCL 25 MG PO CAPS
ORAL_CAPSULE | ORAL | Status: AC
  Filled 2014-11-19: qty 1

## 2014-11-19 MED ORDER — FUROSEMIDE 10 MG/ML IJ SOLN
20.0000 mg | Freq: Once | INTRAMUSCULAR | Status: AC
  Administered 2014-11-19: 20 mg via INTRAVENOUS

## 2014-11-19 MED ORDER — SODIUM CHLORIDE 0.9 % IV SOLN
250.0000 mL | Freq: Once | INTRAVENOUS | Status: AC
  Administered 2014-11-19: 250 mL via INTRAVENOUS

## 2014-11-19 MED ORDER — ACETAMINOPHEN 325 MG PO TABS
ORAL_TABLET | ORAL | Status: AC
  Filled 2014-11-19: qty 2

## 2014-11-19 MED ORDER — DIPHENHYDRAMINE HCL 25 MG PO CAPS
25.0000 mg | ORAL_CAPSULE | Freq: Once | ORAL | Status: AC
  Administered 2014-11-19: 25 mg via ORAL

## 2014-11-19 NOTE — Assessment & Plan Note (Signed)
She follows at the pulmonary clinic regularly. She has come off oxygen recently. I recommend she resume oxygen at nighttime.

## 2014-11-19 NOTE — Patient Instructions (Signed)

## 2014-11-19 NOTE — Assessment & Plan Note (Signed)
Her blood work is worse today. She had interrupted dosing of Revlimid for almost a month due to recent hospitalization and I suspect that might be the cause of it. I plan to see her back in another 3 weeks for further assessment. In the meantime, I am not enthusiastic to change her dose. We will proceed with blood transfusion support today.

## 2014-11-19 NOTE — Progress Notes (Signed)
[place] OFFICE PROGRESS NOTE  Patient Care Team: Carlos Levering, PA-C as PCP - General (Family Medicine) Lennon Alstrom, MD (Neurology) Carlos Levering, PA-C as Referring Physician (Family Medicine) Heath Lark, MD as Consulting Physician (Hematology and Oncology)  SUMMARY OF ONCOLOGIC HISTORY:  She was found to have abnormal CBC from routine blood work. Starting around 2012, she has progressive anemia with hemoglobin and the lowest at 9.1.  She denies recent chest pain on exertion but complains of significant shortness of breath on minimal exertion. She has had pre-syncopal episodes and palpitations. She has profound fatigue and takes frequent naps. Recently, she had a fall.  Of note, the patient has 60 pack plus year history and recently was placed on oxygen therapy.  She never has EGD or screening colonoscopy.  She has been taking vitamin B12 supplements for a long time.  She has remote history of left breast cancer detected from mammogram. According to the patient, she had lumpectomy followed by radiation followed by tamoxifen. She does not know the stage of disease but she does not think the lymph nodes were involved.  The patient was prescribed oral iron supplements and she takes them regularly.  On 12/27/13: Bone marrow biopsy did not show evidence of leukemia or metastatic cancer. Cytogenetics and FISH analysis confirm myelodysplastic syndrome with deletion 5Q. The patient was started on erythropoietin stimulating agent to keep hemoglobin greater than 10 g. On 04/15/14: CT head was negative On 04/24/2014, we discontinued darbepoetin as the patient complained of side effects. She was started on prednisone 10 mg daily. On 05/22/14: Prednisone is tapered to 5 mg. On 06/16/2014, she started on Revlimid 2.5 mg daily. From 09/15/2014 to 09/17/2014, she was hospitalized due to acute fall. She was subsequently discharged to rehabilitation facility and her treatment was placed on  hold. On 10/14/2014, she received 1 L IV fluids due to weakness and hypotension On 10/20/2014, she resume Revlimid 2.5 mg daily along with 2.5 mg of prednisone  INTERVAL HISTORY: Please see below for problem oriented charting. She complained of fatigue, chronic shortness of breath on exertion and chronic headaches. She is able to eat well. Denies recent nausea or vomiting.  REVIEW OF SYSTEMS:   Constitutional: Denies fevers, chills or abnormal weight loss Eyes: Denies blurriness of vision Ears, nose, mouth, throat, and face: Denies mucositis or sore throat Cardiovascular: Denies palpitation, chest discomfort or lower extremity swelling Gastrointestinal:  Denies nausea, heartburn or change in bowel habits Skin: Denies abnormal skin rashes Lymphatics: Denies new lymphadenopathy or easy bruising Neurological:Denies numbness, tingling or new weaknesses Behavioral/Psych: Mood is stable, no new changes  All other systems were reviewed with the patient and are negative.  I have reviewed the past medical history, past surgical history, social history and family history with the patient and they are unchanged from previous note.  ALLERGIES:  is allergic to donepezil; penicillins; amoxapine and related; doxycycline; keflex; bupropion; ketek; levaquin; sulfa antibiotics; tramadol; and zyrtec.  MEDICATIONS:  Current Outpatient Prescriptions  Medication Sig Dispense Refill  . acetaminophen (TYLENOL) 325 MG tablet Take 650 mg by mouth every 6 (six) hours as needed for moderate pain (pain).     . calcium carbonate (TUMS - DOSED IN MG ELEMENTAL CALCIUM) 500 MG chewable tablet Chew 1 tablet by mouth daily as needed for indigestion or heartburn.    . chlorhexidine (PERIDEX) 0.12 % solution 15 mLs by Mouth Rinse route 2 (two) times daily. 120 mL 0  . Cholecalciferol (VITAMIN D) 2000 UNITS  tablet Take 2,000 Units by mouth daily.    . famotidine (PEPCID AC) 10 MG tablet Take 1 tablet (10 mg total) by  mouth 2 (two) times daily. 30 tablet 0  . FLUoxetine (PROZAC) 10 MG capsule Take 3 capsules (30 mg total) by mouth every morning. 30 capsule 0  . fluticasone (FLONASE) 50 MCG/ACT nasal spray Place 1 spray into the nose daily as needed for allergies (allergies).     Marland Kitchen lenalidomide (REVLIMID) 2.5 MG capsule Take 1 capsule (2.5 mg total) by mouth daily. For 28 days. Faxed to [facility]. Auth # [id number] 28 capsule 0  . Liniments (SALONPAS EX) Apply 1 patch topically daily.    Marland Kitchen LORazepam (ATIVAN) 0.5 MG tablet Take 1 tablet (0.5 mg total) by mouth every 8 (eight) hours as needed for anxiety (anxiety). 30 tablet 0  . menthol-cetylpyridinium (CEPACOL) 3 MG lozenge Take 1 lozenge (3 mg total) by mouth as needed for sore throat. 30 tablet 0  . metoprolol succinate (TOPROL-XL) 50 MG 24 hr tablet Take 50 mg by mouth every evening. Take with or immediately following a meal.    . ondansetron (ZOFRAN) 4 MG tablet Take 1 tablet (4 mg total) by mouth every 6 (six) hours as needed for nausea. 20 tablet 0  . polyethylene glycol (MIRALAX / GLYCOLAX) packet Take 17 g by mouth daily. 14 each 0  . predniSONE (DELTASONE) 2.5 MG tablet Take 1 tablet (2.5 mg total) by mouth daily with breakfast. 30 tablet 3  . valsartan (DIOVAN) 160 MG tablet Take 1 tablet (160 mg total) by mouth daily. 30 tablet 11  . albuterol (PROVENTIL HFA;VENTOLIN HFA) 108 (90 BASE) MCG/ACT inhaler Inhale 2 puffs into the lungs every 6 (six) hours as needed for wheezing or shortness of breath (wheezing).     Marland Kitchen ipratropium-albuterol (DUONEB) 0.5-2.5 (3) MG/3ML SOLN Take 3 mLs by nebulization 2 (two) times daily as needed (wheezing). (Patient not taking: Reported on 11/19/2014) 360 mL 0   No current facility-administered medications for this visit.   Facility-Administered Medications Ordered in Other Visits  Medication Dose Route Frequency Provider Last Rate Last Dose  . 0.9 %  sodium chloride infusion  250 mL Intravenous Once Heath Lark, MD      .  acetaminophen (TYLENOL) tablet 650 mg  650 mg Oral Once Heath Lark, MD      . diphenhydrAMINE (BENADRYL) capsule 25 mg  25 mg Oral Once Heath Lark, MD      . furosemide (LASIX) injection 20 mg  20 mg Intravenous Once Heath Lark, MD        PHYSICAL EXAMINATION: ECOG PERFORMANCE STATUS: 1 - Symptomatic but completely ambulatory  Filed Vitals:   11/19/14 1000  BP: 127/55  Pulse: 59  Temp: 97.5 F (36.4 C)  Resp: 17   Filed Weights   11/19/14 1000  Weight: 111 lb 6.4 oz (50.531 kg)    GENERAL:alert, no distress and comfortable. She looks pale SKIN: skin color, texture, turgor are normal, no rashes or significant lesions EYES: normal, Conjunctiva are pink and non-injected, sclera clear OROPHARYNX:no exudate, no erythema and lips, buccal mucosa, and tongue normal  NECK: supple, thyroid normal size, non-tender, without nodularity LYMPH:  no palpable lymphadenopathy in the cervical, axillary or inguinal LUNGS: clear to auscultation and percussion with normal breathing effort HEART: regular rate & rhythm and no murmurs and no lower extremity edema ABDOMEN:abdomen soft, non-tender and normal bowel sounds Musculoskeletal:no cyanosis of digits and no clubbing  NEURO: alert & oriented x  3 with fluent speech, no focal motor/sensory deficits  LABORATORY DATA:  I have reviewed the data as listed    Component Value Date/Time   NA 140 10/14/2014 1210   NA 136 09/16/2014 0500   K 4.4 10/14/2014 1210   K 3.7 09/16/2014 0500   CL 102 09/16/2014 0500   CO2 23 10/14/2014 1210   CO2 25 09/16/2014 0500   GLUCOSE 159* 10/14/2014 1210   GLUCOSE 103* 09/16/2014 0500   BUN 23.8 10/14/2014 1210   BUN 11 09/16/2014 0500   CREATININE 0.8 10/14/2014 1210   CREATININE 0.62 09/16/2014 0500   CALCIUM 8.6 10/14/2014 1210   CALCIUM 7.8* 09/16/2014 0500   PROT 6.6 10/14/2014 1210   PROT 6.9 09/15/2014 1331   ALBUMIN 3.4* 10/14/2014 1210   ALBUMIN 3.4* 09/15/2014 1331   AST 18 10/14/2014 1210   AST  15 09/15/2014 1331   ALT 8 10/14/2014 1210   ALT 8 09/15/2014 1331   ALKPHOS 67 10/14/2014 1210   ALKPHOS 65 09/15/2014 1331   BILITOT 0.48 10/14/2014 1210   BILITOT 0.8 09/15/2014 1331   GFRNONAA 81* 09/16/2014 0500   GFRAA >90 09/16/2014 0500    No results found for: SPEP, UPEP  Lab Results  Component Value Date   WBC 8.4 11/19/2014   NEUTROABS 2.9 10/16/2014   HGB 7.0* 11/19/2014   HCT 22.6* 11/19/2014   MCV 104.8* 11/19/2014   PLT 99* 11/19/2014      Chemistry      Component Value Date/Time   NA 140 10/14/2014 1210   NA 136 09/16/2014 0500   K 4.4 10/14/2014 1210   K 3.7 09/16/2014 0500   CL 102 09/16/2014 0500   CO2 23 10/14/2014 1210   CO2 25 09/16/2014 0500   BUN 23.8 10/14/2014 1210   BUN 11 09/16/2014 0500   CREATININE 0.8 10/14/2014 1210   CREATININE 0.62 09/16/2014 0500      Component Value Date/Time   CALCIUM 8.6 10/14/2014 1210   CALCIUM 7.8* 09/16/2014 0500   ALKPHOS 67 10/14/2014 1210   ALKPHOS 65 09/15/2014 1331   AST 18 10/14/2014 1210   AST 15 09/15/2014 1331   ALT 8 10/14/2014 1210   ALT 8 09/15/2014 1331   BILITOT 0.48 10/14/2014 1210   BILITOT 0.8 09/15/2014 1331      ASSESSMENT & PLAN:  MDS (myelodysplastic syndrome) with 5q deletion Her blood work is worse today. She had interrupted dosing of Revlimid for almost a month due to recent hospitalization and I suspect that might be the cause of it. I plan to see her back in another 3 weeks for further assessment. In the meantime, I am not enthusiastic to change her dose. We will proceed with blood transfusion support today.   Anemia in neoplastic disease We discussed some of the risks, benefits, and alternatives of blood transfusions. The patient is symptomatic from anemia and the hemoglobin level is critically low.  Some of the side-effects to be expected including risks of transfusion reactions, chills, infection, syndrome of volume overload and risk of hospitalization from various  reasons and the patient is willing to proceed and went ahead to sign consent today. She will get 2 units of blood whenever hemoglobin dropped to less than 8 g.   Thrombocytopenia due to drugs This is likely due to recent treatment. The patient denies recent history of bleeding such as epistaxis, hematuria or hematochezia. She is asymptomatic from the low platelet count. I will observe for now.  Persistent headaches She has chronic headaches which I suspect it is exacerbated by anemia and hypoxemia. After blood transfusion, I recommend she continues to use oxygen regularly at nighttime.   COPD GOLD II She follows at the pulmonary clinic regularly. She has come off oxygen recently. I recommend she resume oxygen at nighttime.    Orders Placed This Encounter  Procedures  . Comprehensive metabolic panel    Standing Status: Future     Number of Occurrences:      Standing Expiration Date: 12/24/2015   All questions were answered. The patient knows to call the clinic with any problems, questions or concerns. No barriers to learning was detected. I spent 30 minutes counseling the patient face to face. The total time spent in the appointment was 40 minutes and more than 50% was on counseling and review of test results     [facility], [place], MD 11/19/2014 10:53 AM

## 2014-11-19 NOTE — Assessment & Plan Note (Signed)
She has chronic headaches which I suspect it is exacerbated by anemia and hypoxemia. After blood transfusion, I recommend she continues to use oxygen regularly at nighttime.

## 2014-11-19 NOTE — Assessment & Plan Note (Signed)
We discussed some of the risks, benefits, and alternatives of blood transfusions. The patient is symptomatic from anemia and the hemoglobin level is critically low.  Some of the side-effects to be expected including risks of transfusion reactions, chills, infection, syndrome of volume overload and risk of hospitalization from various reasons and the patient is willing to proceed and went ahead to sign consent today. She will get 2 units of blood whenever hemoglobin dropped to less than 8 g.

## 2014-11-19 NOTE — Telephone Encounter (Signed)
gave and printed appt sched and avs for pt for April °

## 2014-11-19 NOTE — Assessment & Plan Note (Signed)
This is likely due to recent treatment. The patient denies recent history of bleeding such as epistaxis, hematuria or hematochezia. She is asymptomatic from the low platelet count. I will observe for now.  

## 2014-11-20 LAB — TYPE AND SCREEN
ABO/RH(D): O POS
Antibody Screen: NEGATIVE
Unit division: 0
Unit division: 0

## 2014-11-21 NOTE — Telephone Encounter (Signed)
MOved up blood transfusion

## 2014-12-02 ENCOUNTER — Telehealth: Payer: Self-pay | Admitting: *Deleted

## 2014-12-02 MED ORDER — LENALIDOMIDE 2.5 MG PO CAPS: 2.5000 mg | ORAL_CAPSULE | Freq: Every day | ORAL | Status: DC

## 2014-12-02 NOTE — Telephone Encounter (Signed)
Daughter states pt has two Revlimid left and needs new rx sent to [facility].   New Rx faxed to [facility].

## 2014-12-12 ENCOUNTER — Other Ambulatory Visit (HOSPITAL_BASED_OUTPATIENT_CLINIC_OR_DEPARTMENT_OTHER): Payer: Medicare Other

## 2014-12-12 ENCOUNTER — Encounter: Payer: Self-pay | Admitting: Hematology and Oncology

## 2014-12-12 ENCOUNTER — Ambulatory Visit (HOSPITAL_BASED_OUTPATIENT_CLINIC_OR_DEPARTMENT_OTHER): Payer: Medicare Other | Admitting: Hematology and Oncology

## 2014-12-12 ENCOUNTER — Telehealth: Payer: Self-pay | Admitting: Hematology and Oncology

## 2014-12-12 VITALS — BP 127/54 | HR 58 | Temp 97.5°F | Resp 17 | Ht 62.0 in | Wt 112.0 lb

## 2014-12-12 DIAGNOSIS — D46C Myelodysplastic syndrome with isolated del(5q) chromosomal abnormality: Secondary | ICD-10-CM | POA: Diagnosis not present

## 2014-12-12 DIAGNOSIS — D630 Anemia in neoplastic disease: Secondary | ICD-10-CM | POA: Diagnosis not present

## 2014-12-12 DIAGNOSIS — K089 Disorder of teeth and supporting structures, unspecified: Secondary | ICD-10-CM | POA: Insufficient documentation

## 2014-12-12 DIAGNOSIS — K088 Other specified disorders of teeth and supporting structures: Secondary | ICD-10-CM

## 2014-12-12 DIAGNOSIS — E46 Unspecified protein-calorie malnutrition: Secondary | ICD-10-CM | POA: Diagnosis not present

## 2014-12-12 DIAGNOSIS — D6959 Other secondary thrombocytopenia: Secondary | ICD-10-CM | POA: Diagnosis not present

## 2014-12-12 DIAGNOSIS — T50905A Adverse effect of unspecified drugs, medicaments and biological substances, initial encounter: Secondary | ICD-10-CM

## 2014-12-12 LAB — COMPREHENSIVE METABOLIC PANEL (CC13)
ALK PHOS: 62 U/L (ref 40–150)
ALT: 8 U/L (ref 0–55)
AST: 11 U/L (ref 5–34)
Albumin: 3.2 g/dL — ABNORMAL LOW (ref 3.5–5.0)
Anion Gap: 10 mEq/L (ref 3–11)
BUN: 16.9 mg/dL (ref 7.0–26.0)
CALCIUM: 8.7 mg/dL (ref 8.4–10.4)
CO2: 24 mEq/L (ref 22–29)
Chloride: 106 mEq/L (ref 98–109)
Creatinine: 0.9 mg/dL (ref 0.6–1.1)
EGFR: 56 mL/min/{1.73_m2} — ABNORMAL LOW (ref >90)
GLUCOSE: 95 mg/dL (ref 70–140)
POTASSIUM: 3.8 meq/L (ref 3.5–5.1)
Sodium: 139 mEq/L (ref 136–145)
TOTAL PROTEIN: 6.8 g/dL (ref 6.4–8.3)
Total Bilirubin: 0.46 mg/dL (ref 0.20–1.20)

## 2014-12-12 LAB — CBC WITH DIFFERENTIAL/PLATELET
BASO%: 1 % (ref 0.0–2.0)
BASOS ABS: 0.1 10*3/uL (ref 0.0–0.1)
EOS%: 2.9 % (ref 0.0–7.0)
Eosinophils Absolute: 0.2 10*3/uL (ref 0.0–0.5)
HCT: 27.2 % — ABNORMAL LOW (ref 34.8–46.6)
HGB: 8.8 g/dL — ABNORMAL LOW (ref 11.6–15.9)
LYMPH#: 1.9 10*3/uL (ref 0.9–3.3)
LYMPH%: 28.4 % (ref 14.0–49.7)
MCH: 32.1 pg (ref 25.1–34.0)
MCHC: 32.4 g/dL (ref 31.5–36.0)
MCV: 99.3 fL (ref 79.5–101.0)
MONO#: 1.1 10*3/uL — ABNORMAL HIGH (ref 0.1–0.9)
MONO%: 16.1 % — ABNORMAL HIGH (ref 0.0–14.0)
NEUT#: 3.5 10*3/uL (ref 1.5–6.5)
NEUT%: 51.6 % (ref 38.4–76.8)
Platelets: 59 10*3/uL — ABNORMAL LOW (ref 145–400)
RBC: 2.74 10*6/uL — AB (ref 3.70–5.45)
RDW: 22.1 % — AB (ref 11.2–14.5)
WBC: 6.8 10*3/uL (ref 3.9–10.3)
nRBC: 0 % (ref 0–0)

## 2014-12-12 LAB — HOLD TUBE, BLOOD BANK

## 2014-12-12 LAB — TECHNOLOGIST REVIEW

## 2014-12-12 MED ORDER — PREDNISONE 2.5 MG PO TABS: 2.5000 mg | ORAL_TABLET | Freq: Every day | ORAL | Status: DC

## 2014-12-12 NOTE — Assessment & Plan Note (Signed)
This is likely anemia of chronic disease and related to her bone marrow disorder. The patient denies recent history of bleeding such as epistaxis, hematuria or hematochezia. She is asymptomatic from the anemia. We will observe for now.  She does not require transfusion now.  She will get 2 units of blood whenever hemoglobin dropped to less than 8 g. She will continue on same dose Revlimid for now and will return every other week for blood work monitoring.

## 2014-12-12 NOTE — Progress Notes (Signed)
[place] OFFICE PROGRESS NOTE  Patient Care Team: Carlos Levering, PA-C as PCP - General (Family Medicine) Lennon Alstrom, MD (Neurology) Carlos Levering, PA-C as Referring Physician (Family Medicine) Heath Lark, MD as Consulting Physician (Hematology and Oncology)  SUMMARY OF ONCOLOGIC HISTORY:  She was found to have abnormal CBC from routine blood work. Starting around 2012, she has progressive anemia with hemoglobin and the lowest at 9.1.  She denies recent chest pain on exertion but complains of significant shortness of breath on minimal exertion. She has had pre-syncopal episodes and palpitations. She has profound fatigue and takes frequent naps. Recently, she had a fall.  Of note, the patient has 60 pack plus year history and recently was placed on oxygen therapy.  She never has EGD or screening colonoscopy.  She has been taking vitamin B12 supplements for a long time.  She has remote history of left breast cancer detected from mammogram. According to the patient, she had lumpectomy followed by radiation followed by tamoxifen. She does not know the stage of disease but she does not think the lymph nodes were involved.  The patient was prescribed oral iron supplements and she takes them regularly.  On 12/27/13: Bone marrow biopsy did not show evidence of leukemia or metastatic cancer. Cytogenetics and FISH analysis confirm myelodysplastic syndrome with deletion 5Q. The patient was started on erythropoietin stimulating agent to keep hemoglobin greater than 10 g. On 04/15/14: CT head was negative On 04/24/2014, we discontinued darbepoetin as the patient complained of side effects. She was started on prednisone 10 mg daily. On 05/22/14: Prednisone is tapered to 5 mg. On 06/16/2014, she started on Revlimid 2.5 mg daily. From 09/15/2014 to 09/17/2014, she was hospitalized due to acute fall. She was subsequently discharged to rehabilitation facility and her treatment was placed on  hold. On 10/14/2014, she received 1 L IV fluids due to weakness and hypotension On 10/20/2014, she resume Revlimid 2.5 mg daily along with 2.5 mg of prednisone On 12/09/2014, she had dental extraction for dental abscess  INTERVAL HISTORY: Please see below for problem oriented charting. She returns for further follow-up. She is recovering well from recent dental extraction and dental abscess. She continues to complain of feeling fatigued. She bruises easily. The patient denies any recent signs or symptoms of bleeding such as spontaneous epistaxis, hematuria or hematochezia. She denies recent smoking.  REVIEW OF SYSTEMS:   Constitutional: Denies fevers, chills or abnormal weight loss Eyes: Denies blurriness of vision Ears, nose, mouth, throat, and face: Denies mucositis or sore throat Respiratory: Denies cough, dyspnea or wheezes Cardiovascular: Denies palpitation, chest discomfort or lower extremity swelling Gastrointestinal:  Denies nausea, heartburn or change in bowel habits Skin: Denies abnormal skin rashes Lymphatics: Denies new lymphadenopathy  Neurological:Denies numbness, tingling or new weaknesses Behavioral/Psych: Mood is stable, no new changes  All other systems were reviewed with the patient and are negative.  I have reviewed the past medical history, past surgical history, social history and family history with the patient and they are unchanged from previous note.  ALLERGIES:  is allergic to donepezil; penicillins; amoxapine and related; doxycycline; keflex; bupropion; ketek; levaquin; sulfa antibiotics; tramadol; and zyrtec.  MEDICATIONS:  Current Outpatient Prescriptions  Medication Sig Dispense Refill  . acetaminophen (TYLENOL) 325 MG tablet Take 650 mg by mouth every 6 (six) hours as needed for moderate pain (pain).     Marland Kitchen albuterol (PROVENTIL HFA;VENTOLIN HFA) 108 (90 BASE) MCG/ACT inhaler Inhale 2 puffs into the lungs every 6 (six)  hours as needed for wheezing or  shortness of breath (wheezing).     . calcium carbonate (TUMS - DOSED IN MG ELEMENTAL CALCIUM) 500 MG chewable tablet Chew 1 tablet by mouth daily as needed for indigestion or heartburn.    . chlorhexidine (PERIDEX) 0.12 % solution 15 mLs by Mouth Rinse route 2 (two) times daily. 120 mL 0  . Cholecalciferol (VITAMIN D) 2000 UNITS tablet Take 2,000 Units by mouth daily.    . famotidine (PEPCID AC) 10 MG tablet Take 1 tablet (10 mg total) by mouth 2 (two) times daily. 30 tablet 0  . FLUoxetine (PROZAC) 10 MG capsule Take 3 capsules (30 mg total) by mouth every morning. 30 capsule 0  . fluticasone (FLONASE) 50 MCG/ACT nasal spray Place 1 spray into the nose daily as needed for allergies (allergies).     Marland Kitchen ipratropium-albuterol (DUONEB) 0.5-2.5 (3) MG/3ML SOLN Take 3 mLs by nebulization 2 (two) times daily as needed (wheezing). 360 mL 0  . lenalidomide (REVLIMID) 2.5 MG capsule Take 1 capsule (2.5 mg total) by mouth daily. 28 capsule 0  . LORazepam (ATIVAN) 0.5 MG tablet Take 1 tablet (0.5 mg total) by mouth every 8 (eight) hours as needed for anxiety (anxiety). 30 tablet 0  . metoprolol succinate (TOPROL-XL) 50 MG 24 hr tablet Take 50 mg by mouth every evening. Take with or immediately following a meal.    . predniSONE (DELTASONE) 2.5 MG tablet Take 1 tablet (2.5 mg total) by mouth daily with breakfast. 30 tablet 3  . valsartan (DIOVAN) 160 MG tablet Take 1 tablet (160 mg total) by mouth daily. 30 tablet 11  . amoxicillin (AMOXIL) 500 MG capsule   0  . Liniments (SALONPAS EX) Apply 1 patch topically daily.    Marland Kitchen menthol-cetylpyridinium (CEPACOL) 3 MG lozenge Take 1 lozenge (3 mg total) by mouth as needed for sore throat. (Patient not taking: Reported on 12/12/2014) 30 tablet 0  . ondansetron (ZOFRAN) 4 MG tablet Take 1 tablet (4 mg total) by mouth every 6 (six) hours as needed for nausea. (Patient not taking: Reported on 12/12/2014) 20 tablet 0  . polyethylene glycol (MIRALAX / GLYCOLAX) packet Take 17 g  by mouth daily. (Patient not taking: Reported on 12/12/2014) 14 each 0   No current facility-administered medications for this visit.    PHYSICAL EXAMINATION: ECOG PERFORMANCE STATUS: 2 - Symptomatic, <50% confined to bed  Filed Vitals:   12/12/14 0944  BP: 127/54  Pulse: 58  Temp: 97.5 F (36.4 C)  Resp: 17   Filed Weights   12/12/14 0944  Weight: 112 lb (50.803 kg)    GENERAL:alert, no distress and comfortable. She looks pale SKIN: skin color, texture, turgor are normal, no rashes or significant lesions EYES: normal, Conjunctiva are pale and non-injected, sclera clear OROPHARYNX:no exudate, no erythema and lips, buccal mucosa, and tongue normal  NECK: She had mild swelling in the right submandibular region.  LYMPH:  no palpable lymphadenopathy in the cervical, axillary or inguinal LUNGS: clear to auscultation and percussion with normal breathing effort HEART: regular rate & rhythm and no murmurs and no lower extremity edema ABDOMEN:abdomen soft, non-tender and normal bowel sounds Musculoskeletal:no cyanosis of digits and no clubbing  NEURO: alert & oriented x 3 with fluent speech, no focal motor/sensory deficits  LABORATORY DATA:  I have reviewed the data as listed    Component Value Date/Time   NA 139 12/12/2014 0920   NA 136 09/16/2014 0500   K 3.8 12/12/2014 0920  K 3.7 09/16/2014 0500   CL 102 09/16/2014 0500   CO2 24 12/12/2014 0920   CO2 25 09/16/2014 0500   GLUCOSE 95 12/12/2014 0920   GLUCOSE 103* 09/16/2014 0500   BUN 16.9 12/12/2014 0920   BUN 11 09/16/2014 0500   CREATININE 0.9 12/12/2014 0920   CREATININE 0.62 09/16/2014 0500   CALCIUM 8.7 12/12/2014 0920   CALCIUM 7.8* 09/16/2014 0500   PROT 6.8 12/12/2014 0920   PROT 6.9 09/15/2014 1331   ALBUMIN 3.2* 12/12/2014 0920   ALBUMIN 3.4* 09/15/2014 1331   AST 11 12/12/2014 0920   AST 15 09/15/2014 1331   ALT 8 12/12/2014 0920   ALT 8 09/15/2014 1331   ALKPHOS 62 12/12/2014 0920   ALKPHOS 65  09/15/2014 1331   BILITOT 0.46 12/12/2014 0920   BILITOT 0.8 09/15/2014 1331   GFRNONAA 81* 09/16/2014 0500   GFRAA >90 09/16/2014 0500    No results found for: SPEP, UPEP  Lab Results  Component Value Date   WBC 6.8 12/12/2014   NEUTROABS 3.5 12/12/2014   HGB 8.8* 12/12/2014   HCT 27.2* 12/12/2014   MCV 99.3 12/12/2014   PLT 59* 12/12/2014      Chemistry      Component Value Date/Time   NA 139 12/12/2014 0920   NA 136 09/16/2014 0500   K 3.8 12/12/2014 0920   K 3.7 09/16/2014 0500   CL 102 09/16/2014 0500   CO2 24 12/12/2014 0920   CO2 25 09/16/2014 0500   BUN 16.9 12/12/2014 0920   BUN 11 09/16/2014 0500   CREATININE 0.9 12/12/2014 0920   CREATININE 0.62 09/16/2014 0500      Component Value Date/Time   CALCIUM 8.7 12/12/2014 0920   CALCIUM 7.8* 09/16/2014 0500   ALKPHOS 62 12/12/2014 0920   ALKPHOS 65 09/15/2014 1331   AST 11 12/12/2014 0920   AST 15 09/15/2014 1331   ALT 8 12/12/2014 0920   ALT 8 09/15/2014 1331   BILITOT 0.46 12/12/2014 0920   BILITOT 0.8 09/15/2014 1331      ASSESSMENT & PLAN:  MDS (myelodysplastic syndrome) with 5q deletion Her blood work is a little worse today. I reassured the patient and her daughter that this is related to recent infection.  In the meantime, I am not enthusiastic to change her dose She has not needed blood transfusion for wall. I will reassess again in the near future.   Anemia in neoplastic disease This is likely anemia of chronic disease and related to her bone marrow disorder. The patient denies recent history of bleeding such as epistaxis, hematuria or hematochezia. She is asymptomatic from the anemia. We will observe for now.  She does not require transfusion now.  She will get 2 units of blood whenever hemoglobin dropped to less than 8 g. She will continue on same dose Revlimid for now and will return every other week for blood work monitoring.     Thrombocytopenia due to drugs This is likely due to  recent treatment. The patient denies recent history of bleeding such as epistaxis, hematuria or hematochezia. She is asymptomatic from the low platelet count. I will observe for now.      Protein calorie malnutrition Her appetite remained poor. She have recent weight loss. I will her prescription to receive Ensure supplement.   Poor dentition She had recent dental abscess requiring debridement, extraction and antibiotic therapy. Her poor dentition is likely caused by prior smoking history. I reassured the patient and her daughter that  currently she is not neutropenic while on Revlimid.    No orders of the defined types were placed in this encounter.   All questions were answered. The patient knows to call the clinic with any problems, questions or concerns. No barriers to learning was detected. I spent 25 minutes counseling the patient face to face. The total time spent in the appointment was 30 minutes and more than 50% was on counseling and review of test results     [facility], [place], MD 12/12/2014 4:12 PM

## 2014-12-12 NOTE — Assessment & Plan Note (Signed)
Her blood work is a little worse today. I reassured the patient and her daughter that this is related to recent infection.  In the meantime, I am not enthusiastic to change her dose She has not needed blood transfusion for wall. I will reassess again in the near future.

## 2014-12-12 NOTE — Assessment & Plan Note (Signed)
This is likely due to recent treatment. The patient denies recent history of bleeding such as epistaxis, hematuria or hematochezia. She is asymptomatic from the low platelet count. I will observe for now.  

## 2014-12-12 NOTE — Assessment & Plan Note (Signed)
Her appetite remained poor. She have recent weight loss. I will her prescription to receive Ensure supplement.

## 2014-12-12 NOTE — Assessment & Plan Note (Signed)
She had recent dental abscess requiring debridement, extraction and antibiotic therapy. Her poor dentition is likely caused by prior smoking history. I reassured the patient and her daughter that currently she is not neutropenic while on Revlimid.

## 2014-12-12 NOTE — Telephone Encounter (Signed)
Gave avs & calendar for May & June °

## 2014-12-19 ENCOUNTER — Other Ambulatory Visit: Payer: Self-pay | Admitting: Hematology and Oncology

## 2014-12-19 ENCOUNTER — Ambulatory Visit (HOSPITAL_BASED_OUTPATIENT_CLINIC_OR_DEPARTMENT_OTHER): Payer: Medicare Other

## 2014-12-19 ENCOUNTER — Other Ambulatory Visit: Payer: Self-pay | Admitting: *Deleted

## 2014-12-19 ENCOUNTER — Other Ambulatory Visit (HOSPITAL_BASED_OUTPATIENT_CLINIC_OR_DEPARTMENT_OTHER): Payer: Medicare Other

## 2014-12-19 ENCOUNTER — Ambulatory Visit (HOSPITAL_COMMUNITY)
Admission: RE | Admit: 2014-12-19 | Discharge: 2014-12-19 | Disposition: A | Discharge status: Home / Self Care | Payer: Medicare Other | Source: Ambulatory Visit | Attending: Hematology and Oncology | Admitting: Hematology and Oncology

## 2014-12-19 VITALS — BP 148/64 | HR 64 | Temp 97.1°F | Resp 18

## 2014-12-19 DIAGNOSIS — D630 Anemia in neoplastic disease: Secondary | ICD-10-CM | POA: Diagnosis not present

## 2014-12-19 DIAGNOSIS — D46C Myelodysplastic syndrome with isolated del(5q) chromosomal abnormality: Secondary | ICD-10-CM | POA: Diagnosis present

## 2014-12-19 DIAGNOSIS — R197 Diarrhea, unspecified: Secondary | ICD-10-CM

## 2014-12-19 LAB — CBC WITH DIFFERENTIAL/PLATELET
BASO%: 0.5 % (ref 0.0–2.0)
Basophils Absolute: 0 10*3/uL (ref 0.0–0.1)
EOS ABS: 0.2 10*3/uL (ref 0.0–0.5)
EOS%: 3.9 % (ref 0.0–7.0)
HCT: 25.6 % — ABNORMAL LOW (ref 34.8–46.6)
HGB: 8.2 g/dL — ABNORMAL LOW (ref 11.6–15.9)
LYMPH%: 43.8 % (ref 14.0–49.7)
MCH: 32.4 pg (ref 25.1–34.0)
MCHC: 32 g/dL (ref 31.5–36.0)
MCV: 101.2 fL — ABNORMAL HIGH (ref 79.5–101.0)
MONO#: 0.8 10*3/uL (ref 0.1–0.9)
MONO%: 12.6 % (ref 0.0–14.0)
NEUT%: 39.2 % (ref 38.4–76.8)
NEUTROS ABS: 2.3 10*3/uL (ref 1.5–6.5)
PLATELETS: 58 10*3/uL — AB (ref 145–400)
RBC: 2.53 10*6/uL — AB (ref 3.70–5.45)
RDW: 22.6 % — AB (ref 11.2–14.5)
WBC: 5.9 10*3/uL (ref 3.9–10.3)
lymph#: 2.6 10*3/uL (ref 0.9–3.3)
nRBC: 0 % (ref 0–0)

## 2014-12-19 LAB — PREPARE RBC (CROSSMATCH)

## 2014-12-19 LAB — HOLD TUBE, BLOOD BANK

## 2014-12-19 LAB — TECHNOLOGIST REVIEW: Technologist Review: 16

## 2014-12-19 MED ORDER — SODIUM CHLORIDE 0.9 % IV SOLN
250.0000 mL | Freq: Once | INTRAVENOUS | Status: AC
  Administered 2014-12-19: 250 mL via INTRAVENOUS

## 2014-12-19 MED ORDER — LOPERAMIDE HCL 2 MG PO CAPS
4.0000 mg | ORAL_CAPSULE | Freq: Once | ORAL | Status: AC
  Administered 2014-12-19: 4 mg via ORAL

## 2014-12-19 MED ORDER — DIPHENHYDRAMINE HCL 25 MG PO CAPS
25.0000 mg | ORAL_CAPSULE | Freq: Once | ORAL | Status: AC
  Administered 2014-12-19: 25 mg via ORAL

## 2014-12-19 MED ORDER — ACETAMINOPHEN 325 MG PO TABS
650.0000 mg | ORAL_TABLET | Freq: Once | ORAL | Status: AC
  Administered 2014-12-19: 650 mg via ORAL

## 2014-12-19 MED ORDER — ACETAMINOPHEN 325 MG PO TABS
ORAL_TABLET | ORAL | Status: AC
Start: 2014-12-19 — End: 2014-12-19
  Filled 2014-12-19: qty 2

## 2014-12-19 MED ORDER — DIPHENHYDRAMINE HCL 25 MG PO CAPS
ORAL_CAPSULE | ORAL | Status: AC
  Filled 2014-12-19: qty 1

## 2014-12-19 MED ORDER — LOPERAMIDE HCL 2 MG PO CAPS
ORAL_CAPSULE | ORAL | Status: AC
  Filled 2014-12-19: qty 2

## 2014-12-19 NOTE — Progress Notes (Signed)
Patient reports abdominal cramping and diarrhea while in infusion. Her daughter reports recent antibiotic use for dental procedures. Reported to Selena Lesser, NP. Test for C.dif with next stool and give 4 mg imodium. Informed patient to notify staff with next BM so we can obtain specimen. She verbalizes understanding.

## 2014-12-19 NOTE — Patient Instructions (Signed)

## 2014-12-20 LAB — TYPE AND SCREEN
ABO/RH(D): O POS
ANTIBODY SCREEN: NEGATIVE
UNIT DIVISION: 0

## 2014-12-23 ENCOUNTER — Telehealth: Payer: Self-pay | Admitting: *Deleted

## 2014-12-23 NOTE — Telephone Encounter (Signed)
Patient daughter called to inform MD that her mother recently had a blood transfusion, but she is still not feeling well. Patient is eating/drinking well. Daughter states that she "cant do anything." Message sent to MD and RN.

## 2014-12-24 ENCOUNTER — Telehealth: Payer: Self-pay | Admitting: *Deleted

## 2014-12-24 ENCOUNTER — Telehealth: Payer: Self-pay | Admitting: Hematology and Oncology

## 2014-12-24 ENCOUNTER — Other Ambulatory Visit (HOSPITAL_BASED_OUTPATIENT_CLINIC_OR_DEPARTMENT_OTHER): Payer: Medicare Other

## 2014-12-24 DIAGNOSIS — D46C Myelodysplastic syndrome with isolated del(5q) chromosomal abnormality: Secondary | ICD-10-CM | POA: Diagnosis not present

## 2014-12-24 LAB — MANUAL DIFFERENTIAL
ALC: 2.3 10*3/uL (ref 0.9–3.3)
ANC (CHCC MAN DIFF): 2.1 10*3/uL (ref 1.5–6.5)
BLASTS: 0 % (ref 0–0)
Band Neutrophils: 10 % (ref 0–10)
Basophil: 24 % — ABNORMAL HIGH (ref 0–2)
EOS: 1 % (ref 0–7)
LYMPH: 34 % (ref 14–49)
MONO: 9 % (ref 0–14)
Metamyelocytes: 3 % — ABNORMAL HIGH (ref 0–0)
Myelocytes: 0 % (ref 0–0)
OTHER CELL: 0 % (ref 0–0)
PLT EST: DECREASED
PROMYELO: 0 % (ref 0–0)
SEG: 19 % — ABNORMAL LOW (ref 38–77)
VARIANT LYMPH: 0 % (ref 0–0)
nRBC: 0 % (ref 0–0)

## 2014-12-24 LAB — CBC WITH DIFFERENTIAL/PLATELET
HCT: 31 % — ABNORMAL LOW (ref 34.8–46.6)
HGB: 9.9 g/dL — ABNORMAL LOW (ref 11.6–15.9)
MCH: 32.1 pg (ref 25.1–34.0)
MCHC: 31.9 g/dL (ref 31.5–36.0)
MCV: 100.6 fL (ref 79.5–101.0)
Platelets: 43 10*3/uL — ABNORMAL LOW (ref 145–400)
RBC: 3.08 10*6/uL — AB (ref 3.70–5.45)
RDW: 21.5 % — AB (ref 11.2–14.5)
WBC: 6.7 10*3/uL (ref 3.9–10.3)

## 2014-12-24 LAB — HOLD TUBE, BLOOD BANK

## 2014-12-24 NOTE — Telephone Encounter (Signed)
Can you see if she is willing to come in for another blood test today?

## 2014-12-24 NOTE — Telephone Encounter (Signed)
Daughter will bring patient in today ~ 10:30 for labs.

## 2014-12-24 NOTE — Telephone Encounter (Signed)
Added appt pe rpof....per pof pt aware °

## 2014-12-26 ENCOUNTER — Telehealth: Payer: Self-pay

## 2014-12-26 ENCOUNTER — Other Ambulatory Visit: Payer: Self-pay | Admitting: *Deleted

## 2014-12-26 MED ORDER — LENALIDOMIDE 2.5 MG PO CAPS: 2.5000 mg | ORAL_CAPSULE | Freq: Every day | ORAL | Status: DC

## 2014-12-26 NOTE — Telephone Encounter (Signed)
revlimid refill request to Dr Alvy Bimler

## 2015-01-02 ENCOUNTER — Other Ambulatory Visit (HOSPITAL_BASED_OUTPATIENT_CLINIC_OR_DEPARTMENT_OTHER): Payer: Medicare Other

## 2015-01-02 ENCOUNTER — Telehealth: Payer: Self-pay | Admitting: *Deleted

## 2015-01-02 DIAGNOSIS — D46C Myelodysplastic syndrome with isolated del(5q) chromosomal abnormality: Secondary | ICD-10-CM

## 2015-01-02 LAB — MANUAL DIFFERENTIAL
ALC: 2.4 10*3/uL (ref 0.9–3.3)
ANC (CHCC manual diff): 2.2 10*3/uL (ref 1.5–6.5)
Band Neutrophils: 10 % (ref 0–10)
Basophil: 14 % — ABNORMAL HIGH (ref 0–2)
EOS: 4 % (ref 0–7)
LYMPH: 39 % (ref 14–49)
METAMYELOCYTES PCT: 2 % — AB (ref 0–0)
MONO: 7 % (ref 0–14)
Myelocytes: 2 % — ABNORMAL HIGH (ref 0–0)
PLT EST: DECREASED
SEG: 22 % — ABNORMAL LOW (ref 38–77)

## 2015-01-02 LAB — CBC WITH DIFFERENTIAL/PLATELET
HCT: 28 % — ABNORMAL LOW (ref 34.8–46.6)
HGB: 9 g/dL — ABNORMAL LOW (ref 11.6–15.9)
MCH: 32.5 pg (ref 25.1–34.0)
MCHC: 32.1 g/dL (ref 31.5–36.0)
MCV: 101.1 fL — ABNORMAL HIGH (ref 79.5–101.0)
Platelets: 44 10*3/uL — ABNORMAL LOW (ref 145–400)
RBC: 2.77 10*6/uL — AB (ref 3.70–5.45)
RDW: 21.2 % — AB (ref 11.2–14.5)
WBC: 6.2 10*3/uL (ref 3.9–10.3)

## 2015-01-02 LAB — HOLD TUBE, BLOOD BANK

## 2015-01-02 NOTE — Telephone Encounter (Signed)
S/w pt in lobby and gave her a copy of labs earlier today.  Informed no need for transfusion and return in two weeks as scheduled. She verbalized understanding.

## 2015-01-02 NOTE — Telephone Encounter (Signed)
-----   Message from Heath Lark, MD sent at 01/02/2015 11:29 AM EDT ----- Regarding: no need blood   ----- Message -----    From: [facility]    Sent: 01/02/2015  11:25 AM      To: Heath Lark, MD

## 2015-01-09 ENCOUNTER — Telehealth: Payer: Self-pay | Admitting: *Deleted

## 2015-01-09 NOTE — Telephone Encounter (Signed)
Fax from [employer]. Revlimid shipped

## 2015-01-14 ENCOUNTER — Telehealth: Payer: Self-pay | Admitting: *Deleted

## 2015-01-14 NOTE — Telephone Encounter (Signed)
Dtr, Tonya Mckee is calling.  She took her mother to the eye doctor for her blurred vision and nothing was wrong.  She would like Cameo /RN for Dr. Alvy Bimler to call her regarding this.  Dawn's number is [phone].

## 2015-01-16 ENCOUNTER — Other Ambulatory Visit (HOSPITAL_BASED_OUTPATIENT_CLINIC_OR_DEPARTMENT_OTHER): Payer: Medicare Other

## 2015-01-16 ENCOUNTER — Other Ambulatory Visit: Payer: Self-pay | Admitting: Hematology and Oncology

## 2015-01-16 ENCOUNTER — Ambulatory Visit (HOSPITAL_BASED_OUTPATIENT_CLINIC_OR_DEPARTMENT_OTHER): Payer: Medicare Other

## 2015-01-16 ENCOUNTER — Ambulatory Visit (HOSPITAL_COMMUNITY)
Admission: RE | Admit: 2015-01-16 | Discharge: 2015-01-16 | Disposition: A | Discharge status: Home / Self Care | Payer: Medicare Other | Source: Ambulatory Visit | Attending: Hematology and Oncology | Admitting: Hematology and Oncology

## 2015-01-16 VITALS — BP 136/56 | HR 57 | Temp 98.9°F | Resp 18

## 2015-01-16 DIAGNOSIS — D469 Myelodysplastic syndrome, unspecified: Secondary | ICD-10-CM

## 2015-01-16 DIAGNOSIS — D46C Myelodysplastic syndrome with isolated del(5q) chromosomal abnormality: Secondary | ICD-10-CM

## 2015-01-16 LAB — MANUAL DIFFERENTIAL
ALC: 2 10*3/uL (ref 0.9–3.3)
ANC (CHCC manual diff): 2.4 10*3/uL (ref 1.5–6.5)
Band Neutrophils: 5 % (ref 0–10)
Basophil: 12 % — ABNORMAL HIGH (ref 0–2)
EOS: 3 % (ref 0–7)
LYMPH: 32 % (ref 14–49)
MONO: 15 % — AB (ref 0–14)
MYELOCYTES: 1 % — AB (ref 0–0)
Metamyelocytes: 1 % — ABNORMAL HIGH (ref 0–0)
PLT EST: DECREASED
Platelet Morphology: NORMAL
SEG: 31 % — ABNORMAL LOW (ref 38–77)

## 2015-01-16 LAB — HOLD TUBE, BLOOD BANK

## 2015-01-16 LAB — CBC WITH DIFFERENTIAL/PLATELET
HCT: 25.3 % — ABNORMAL LOW (ref 34.8–46.6)
HEMOGLOBIN: 8.3 g/dL — AB (ref 11.6–15.9)
MCH: 33.8 pg (ref 25.1–34.0)
MCHC: 32.8 g/dL (ref 31.5–36.0)
MCV: 103.2 fL — AB (ref 79.5–101.0)
Platelets: 44 10*3/uL — ABNORMAL LOW (ref 145–400)
RBC: 2.45 10*6/uL — AB (ref 3.70–5.45)
RDW: 23.2 % — ABNORMAL HIGH (ref 11.2–14.5)
WBC: 6.3 10*3/uL (ref 3.9–10.3)

## 2015-01-16 LAB — PREPARE RBC (CROSSMATCH)

## 2015-01-16 MED ORDER — DIPHENHYDRAMINE HCL 25 MG PO CAPS
ORAL_CAPSULE | ORAL | Status: AC
  Filled 2015-01-16: qty 1

## 2015-01-16 MED ORDER — DIPHENHYDRAMINE HCL 25 MG PO CAPS
25.0000 mg | ORAL_CAPSULE | Freq: Once | ORAL | Status: AC
Start: 2015-01-16 — End: 2015-01-16
  Administered 2015-01-16: 25 mg via ORAL

## 2015-01-16 MED ORDER — ACETAMINOPHEN 325 MG PO TABS
ORAL_TABLET | ORAL | Status: AC
  Filled 2015-01-16: qty 2

## 2015-01-16 MED ORDER — SODIUM CHLORIDE 0.9 % IV SOLN
250.0000 mL | Freq: Once | INTRAVENOUS | Status: AC
  Administered 2015-01-16: 250 mL via INTRAVENOUS

## 2015-01-16 MED ORDER — ACETAMINOPHEN 325 MG PO TABS
650.0000 mg | ORAL_TABLET | Freq: Once | ORAL | Status: AC
  Administered 2015-01-16: 650 mg via ORAL

## 2015-01-16 NOTE — Patient Instructions (Signed)

## 2015-01-18 LAB — TYPE AND SCREEN
ABO/RH(D): O POS
ANTIBODY SCREEN: NEGATIVE
Unit division: 0

## 2015-01-20 ENCOUNTER — Telehealth: Payer: Self-pay | Admitting: *Deleted

## 2015-01-20 ENCOUNTER — Encounter: Payer: Self-pay | Admitting: Hematology and Oncology

## 2015-01-20 ENCOUNTER — Other Ambulatory Visit: Payer: Self-pay | Admitting: Hematology and Oncology

## 2015-01-20 NOTE — Telephone Encounter (Signed)
Faxed letter to dentist.

## 2015-01-30 ENCOUNTER — Other Ambulatory Visit: Payer: Self-pay | Admitting: Hematology and Oncology

## 2015-01-30 ENCOUNTER — Ambulatory Visit (HOSPITAL_BASED_OUTPATIENT_CLINIC_OR_DEPARTMENT_OTHER): Payer: Medicare Other

## 2015-01-30 ENCOUNTER — Telehealth: Payer: Self-pay | Admitting: *Deleted

## 2015-01-30 ENCOUNTER — Other Ambulatory Visit (HOSPITAL_BASED_OUTPATIENT_CLINIC_OR_DEPARTMENT_OTHER): Payer: Medicare Other

## 2015-01-30 ENCOUNTER — Ambulatory Visit (HOSPITAL_BASED_OUTPATIENT_CLINIC_OR_DEPARTMENT_OTHER): Payer: Medicare Other | Admitting: Hematology and Oncology

## 2015-01-30 ENCOUNTER — Telehealth: Payer: Self-pay | Admitting: Hematology and Oncology

## 2015-01-30 VITALS — BP 132/48 | HR 61 | Temp 98.5°F | Resp 16

## 2015-01-30 VITALS — BP 108/40 | HR 51 | Temp 97.3°F | Resp 16 | Ht 62.0 in | Wt 111.4 lb

## 2015-01-30 DIAGNOSIS — D6959 Other secondary thrombocytopenia: Secondary | ICD-10-CM | POA: Diagnosis not present

## 2015-01-30 DIAGNOSIS — D630 Anemia in neoplastic disease: Secondary | ICD-10-CM

## 2015-01-30 DIAGNOSIS — K088 Other specified disorders of teeth and supporting structures: Secondary | ICD-10-CM | POA: Diagnosis not present

## 2015-01-30 DIAGNOSIS — D46C Myelodysplastic syndrome with isolated del(5q) chromosomal abnormality: Secondary | ICD-10-CM

## 2015-01-30 DIAGNOSIS — H538 Other visual disturbances: Secondary | ICD-10-CM | POA: Insufficient documentation

## 2015-01-30 DIAGNOSIS — T50905A Adverse effect of unspecified drugs, medicaments and biological substances, initial encounter: Secondary | ICD-10-CM

## 2015-01-30 DIAGNOSIS — K089 Disorder of teeth and supporting structures, unspecified: Secondary | ICD-10-CM

## 2015-01-30 LAB — MANUAL DIFFERENTIAL
ALC: 2.6 10*3/uL (ref 0.9–3.3)
ANC (CHCC MAN DIFF): 2.3 10*3/uL (ref 1.5–6.5)
BAND NEUTROPHILS: 4 % (ref 0–10)
Basophil: 8 % — ABNORMAL HIGH (ref 0–2)
EOS%: 2 % (ref 0–7)
LYMPH: 40 % (ref 14–49)
MONO: 15 % — AB (ref 0–14)
Metamyelocytes: 2 % — ABNORMAL HIGH (ref 0–0)
Myelocytes: 1 % — ABNORMAL HIGH (ref 0–0)
PLT EST: DECREASED
SEG: 28 % — ABNORMAL LOW (ref 38–77)

## 2015-01-30 LAB — CBC WITH DIFFERENTIAL/PLATELET
HEMATOCRIT: 26.2 % — AB (ref 34.8–46.6)
HGB: 8.4 g/dL — ABNORMAL LOW (ref 11.6–15.9)
MCH: 32 pg (ref 25.1–34.0)
MCHC: 32.2 g/dL (ref 31.5–36.0)
MCV: 99.5 fL (ref 79.5–101.0)
PLATELETS: 38 10*3/uL — AB (ref 145–400)
RBC: 2.63 10*6/uL — ABNORMAL LOW (ref 3.70–5.45)
RDW: 27.2 % — ABNORMAL HIGH (ref 11.2–14.5)
WBC: 6.6 10*3/uL (ref 3.9–10.3)

## 2015-01-30 LAB — HOLD TUBE, BLOOD BANK

## 2015-01-30 MED ORDER — DIPHENHYDRAMINE HCL 25 MG PO CAPS
ORAL_CAPSULE | ORAL | Status: AC
  Filled 2015-01-30: qty 1

## 2015-01-30 MED ORDER — ACETAMINOPHEN 325 MG PO TABS
650.0000 mg | ORAL_TABLET | Freq: Once | ORAL | Status: AC
  Administered 2015-01-30: 650 mg via ORAL

## 2015-01-30 MED ORDER — DIPHENHYDRAMINE HCL 25 MG PO CAPS
25.0000 mg | ORAL_CAPSULE | Freq: Once | ORAL | Status: AC
  Administered 2015-01-30: 25 mg via ORAL

## 2015-01-30 MED ORDER — SODIUM CHLORIDE 0.9 % IV SOLN
250.0000 mL | Freq: Once | INTRAVENOUS | Status: AC
  Administered 2015-01-30: 250 mL via INTRAVENOUS

## 2015-01-30 MED ORDER — ACETAMINOPHEN 325 MG PO TABS
ORAL_TABLET | ORAL | Status: AC
  Filled 2015-01-30: qty 2

## 2015-01-30 NOTE — Telephone Encounter (Signed)
Per staff message and POF I have scheduled appts. Advised scheduler of appts and to move labs. JMW  

## 2015-01-30 NOTE — Assessment & Plan Note (Addendum)
She has poor dentition with abscess that may cause life-threatening  infection. She needs dental extraction. Though not ideal, we will give her preoperative platelet transfusion and let her proceed with extraction, knowing that her risk of bleeding would be high.  Without dental extraction, she could potentially die from sepsis. The patient is aware of the life-threatening situation and despite not ideal, she will proceed with dental extraction today.

## 2015-01-30 NOTE — Telephone Encounter (Signed)
s.w. pt dtr and advised on on June appts...Marland Kitchenok and aware

## 2015-01-30 NOTE — Assessment & Plan Note (Signed)
She has profound bone marrow suppression and pancytopenia due to active dental infection. At present time, she will continue to hold Revlimid and remain on prednisone. We will continue transfusion support and blood work monitoring weekly. I'll see her back in 2 weeks to reassess and possibly resume Revlimid if her blood counts improved.

## 2015-01-30 NOTE — Assessment & Plan Note (Signed)
She has bilateral blurriness of vision and was seen by ophthalmologist recently. Apparently, the prescription eyeglasses needs to be changed. Certainly, long-term prednisone therapy can increase the likelihood of cataracts. However, given significant multiple comorbidities, we will defer surgery possible to the future.

## 2015-01-30 NOTE — Progress Notes (Signed)
[facility] OFFICE PROGRESS NOTE  Patient Care Team: Carlos Levering, PA-C as PCP - General (Family Medicine) Lennon Alstrom, MD (Neurology) Carlos Levering, PA-C as Referring Physician (Family Medicine) Heath Lark, MD as Consulting Physician (Hematology and Oncology)  SUMMARY OF ONCOLOGIC HISTORY:  She was found to have abnormal CBC from routine blood work. Starting around 2012, she has progressive anemia with hemoglobin and the lowest at 9.1.  She denies recent chest pain on exertion but complains of significant shortness of breath on minimal exertion. She has had pre-syncopal episodes and palpitations. She has profound fatigue and takes frequent naps. Recently, she had a fall.  Of note, the patient has 60 pack plus year history and recently was placed on oxygen therapy.  She never has EGD or screening colonoscopy.  She has been taking vitamin B12 supplements for a long time.  She has remote history of left breast cancer detected from mammogram. According to the patient, she had lumpectomy followed by radiation followed by tamoxifen. She does not know the stage of disease but she does not think the lymph nodes were involved.  The patient was prescribed oral iron supplements and she takes them regularly.  On 12/27/13: Bone marrow biopsy did not show evidence of leukemia or metastatic cancer. Cytogenetics and FISH analysis confirm myelodysplastic syndrome with deletion 5Q. The patient was started on erythropoietin stimulating agent to keep hemoglobin greater than 10 g. On 04/15/14: CT head was negative On 04/24/2014, we discontinued darbepoetin as the patient complained of side effects. She was started on prednisone 10 mg daily. On 05/22/14: Prednisone is tapered to 5 mg. On 06/16/2014, she started on Revlimid 2.5 mg daily. From 09/15/2014 to 09/17/2014, she was hospitalized due to acute fall. She was subsequently discharged to rehabilitation facility and her treatment was placed on  hold. On 10/14/2014, she received 1 L IV fluids due to weakness and hypotension On 10/20/2014, she resume Revlimid 2.5 mg daily along with 2.5 mg of prednisone On 12/09/2014, she had dental extraction for dental abscess  INTERVAL HISTORY: Please see below for problem oriented charting. She is seen urgently today in preparation for further dental extraction. She bruises easily. She has stopped taking Revlimid for a week in preparation for this surgery. She complained of fatigue. She complained of bilateral blurry vision.  REVIEW OF SYSTEMS:   Constitutional: Denies fevers, chills or abnormal weight loss Ears, nose, mouth, throat, and face: Denies mucositis or sore throat Respiratory: Denies cough, dyspnea or wheezes Cardiovascular: Denies palpitation, chest discomfort or lower extremity swelling Gastrointestinal:  Denies nausea, heartburn or change in bowel habits Skin: Denies abnormal skin rashes Lymphatics: Denies new lymphadenopathy Neurological:Denies numbness, tingling  Behavioral/Psych: Mood is stable, no new changes  All other systems were reviewed with the patient and are negative.  I have reviewed the past medical history, past surgical history, social history and family history with the patient and they are unchanged from previous note.  ALLERGIES:  is allergic to donepezil; penicillins; amoxapine and related; doxycycline; keflex; bupropion; ketek; levaquin; sulfa antibiotics; tramadol; and zyrtec.  MEDICATIONS:  Current Outpatient Prescriptions  Medication Sig Dispense Refill  . acetaminophen (TYLENOL) 325 MG tablet Take 650 mg by mouth every 6 (six) hours as needed for moderate pain (pain).     Marland Kitchen albuterol (PROVENTIL HFA;VENTOLIN HFA) 108 (90 BASE) MCG/ACT inhaler Inhale 2 puffs into the lungs every 6 (six) hours as needed for wheezing or shortness of breath (wheezing).     . calcium carbonate (TUMS -  DOSED IN MG ELEMENTAL CALCIUM) 500 MG chewable tablet Chew 1 tablet by  mouth daily as needed for indigestion or heartburn.    . chlorhexidine (PERIDEX) 0.12 % solution 15 mLs by Mouth Rinse route 2 (two) times daily. 120 mL 0  . Cholecalciferol (VITAMIN D) 2000 UNITS tablet Take 2,000 Units by mouth daily.    . famotidine (PEPCID AC) 10 MG tablet Take 1 tablet (10 mg total) by mouth 2 (two) times daily. 30 tablet 0  . FLUoxetine (PROZAC) 10 MG capsule Take 3 capsules (30 mg total) by mouth every morning. 30 capsule 0  . fluticasone (FLONASE) 50 MCG/ACT nasal spray Place 1 spray into the nose daily as needed for allergies (allergies).     Marland Kitchen ipratropium-albuterol (DUONEB) 0.5-2.5 (3) MG/3ML SOLN Take 3 mLs by nebulization 2 (two) times daily as needed (wheezing). 360 mL 0  . Liniments (SALONPAS EX) Apply 1 patch topically daily.    Marland Kitchen LORazepam (ATIVAN) 0.5 MG tablet Take 1 tablet (0.5 mg total) by mouth every 8 (eight) hours as needed for anxiety (anxiety). 30 tablet 0  . menthol-cetylpyridinium (CEPACOL) 3 MG lozenge Take 1 lozenge (3 mg total) by mouth as needed for sore throat. 30 tablet 0  . metoprolol succinate (TOPROL-XL) 50 MG 24 hr tablet Take 50 mg by mouth every evening. Take with or immediately following a meal.    . ondansetron (ZOFRAN) 4 MG tablet Take 1 tablet (4 mg total) by mouth every 6 (six) hours as needed for nausea. 20 tablet 0  . polyethylene glycol (MIRALAX / GLYCOLAX) packet Take 17 g by mouth daily. 14 each 0  . predniSONE (DELTASONE) 2.5 MG tablet Take 1 tablet (2.5 mg total) by mouth daily with breakfast. 30 tablet 3  . tiotropium (SPIRIVA) 18 MCG inhalation capsule Place 18 mcg into inhaler and inhale daily.    . valsartan (DIOVAN) 160 MG tablet Take 1 tablet (160 mg total) by mouth daily. 30 tablet 11  . lenalidomide (REVLIMID) 2.5 MG capsule Take 1 capsule (2.5 mg total) by mouth daily. (Patient not taking: Reported on 01/30/2015) 28 capsule 0   No current facility-administered medications for this visit.    PHYSICAL EXAMINATION: ECOG  PERFORMANCE STATUS: 2 - Symptomatic, <50% confined to bed  Filed Vitals:   01/30/15 0920  BP: 108/40  Pulse: 51  Temp: 97.3 F (36.3 C)  Resp: 16   Filed Weights   01/30/15 0920  Weight: 111 lb 6.4 oz (50.531 kg)    GENERAL:alert, no distress and comfortable. She sits on the wheelchair SKIN: Extensive bruises are noted.  EYES: normal, Conjunctiva are pink and non-injected, sclera clear Musculoskeletal:no cyanosis of digits and no clubbing  NEURO: alert & oriented x 3 with fluent speech, no focal motor/sensory deficits  LABORATORY DATA:  I have reviewed the data as listed    Component Value Date/Time   NA 139 12/12/2014 0920   NA 136 09/16/2014 0500   K 3.8 12/12/2014 0920   K 3.7 09/16/2014 0500   CL 102 09/16/2014 0500   CO2 24 12/12/2014 0920   CO2 25 09/16/2014 0500   GLUCOSE 95 12/12/2014 0920   GLUCOSE 103* 09/16/2014 0500   BUN 16.9 12/12/2014 0920   BUN 11 09/16/2014 0500   CREATININE 0.9 12/12/2014 0920   CREATININE 0.62 09/16/2014 0500   CALCIUM 8.7 12/12/2014 0920   CALCIUM 7.8* 09/16/2014 0500   PROT 6.8 12/12/2014 0920   PROT 6.9 09/15/2014 1331   ALBUMIN 3.2* 12/12/2014 0920  ALBUMIN 3.4* 09/15/2014 1331   AST 11 12/12/2014 0920   AST 15 09/15/2014 1331   ALT 8 12/12/2014 0920   ALT 8 09/15/2014 1331   ALKPHOS 62 12/12/2014 0920   ALKPHOS 65 09/15/2014 1331   BILITOT 0.46 12/12/2014 0920   BILITOT 0.8 09/15/2014 1331   GFRNONAA 81* 09/16/2014 0500   GFRAA >90 09/16/2014 0500    No results found for: SPEP, UPEP  Lab Results  Component Value Date   WBC 6.6 01/30/2015   NEUTROABS 2.3 12/19/2014   HGB 8.4* 01/30/2015   HCT 26.2* 01/30/2015   MCV 99.5 01/30/2015   PLT 38* 01/30/2015      Chemistry      Component Value Date/Time   NA 139 12/12/2014 0920   NA 136 09/16/2014 0500   K 3.8 12/12/2014 0920   K 3.7 09/16/2014 0500   CL 102 09/16/2014 0500   CO2 24 12/12/2014 0920   CO2 25 09/16/2014 0500   BUN 16.9 12/12/2014 0920   BUN  11 09/16/2014 0500   CREATININE 0.9 12/12/2014 0920   CREATININE 0.62 09/16/2014 0500      Component Value Date/Time   CALCIUM 8.7 12/12/2014 0920   CALCIUM 7.8* 09/16/2014 0500   ALKPHOS 62 12/12/2014 0920   ALKPHOS 65 09/15/2014 1331   AST 11 12/12/2014 0920   AST 15 09/15/2014 1331   ALT 8 12/12/2014 0920   ALT 8 09/15/2014 1331   BILITOT 0.46 12/12/2014 0920   BILITOT 0.8 09/15/2014 1331      ASSESSMENT & PLAN:  MDS (myelodysplastic syndrome) with 5q deletion She has profound bone marrow suppression and pancytopenia due to active dental infection. At present time, she will continue to hold Revlimid and remain on prednisone. We will continue transfusion support and blood work monitoring weekly. I'll see her back in 2 weeks to reassess and possibly resume Revlimid if her blood counts improved.  Blurred vision, bilateral She has bilateral blurriness of vision and was seen by ophthalmologist recently. Apparently, the prescription eyeglasses needs to be changed. Certainly, long-term prednisone therapy can increase the likelihood of cataracts. However, given significant multiple comorbidities, we will defer surgery possible to the future.  Anemia in neoplastic disease This is likely anemia of chronic disease and related to her bone marrow disorder. The patient denies recent history of bleeding such as epistaxis, hematuria or hematochezia. She is asymptomatic from the anemia. We will observe for now.  She does not require transfusion now.  She will get 2 units of blood whenever hemoglobin dropped to less than 8 g. She will continue on same dose Revlimid for now and will return every other week for blood work monitoring.   Thrombocytopenia due to drugs We discussed some of the risks, benefits, and alternatives of platelets transfusions. The patient is symptomatic from low platelet counts with at high risk of life-threatening bleeding and the platelet count is critically low.   She  also needs dental extraction today. I will proceed with 1 unit of platelet transfusion. Some of the side-effects to be expected including risks of transfusion reactions, chills, infection, syndrome of volume overload and risk of hospitalization from various reasons and the patient is willing to proceed and went ahead to sign consent today.   Poor dentition She has poor dentition with abscess that may cause life-threatening  infection. She needs dental extraction. Though not ideal, we will give her preoperative platelet transfusion and let her proceed with extraction, knowing that her risk of bleeding would  be high.  Without dental extraction, she could potentially die from sepsis. The patient is aware of the life-threatening situation and despite not ideal, she will proceed with dental extraction today.   Orders Placed This Encounter  Procedures  . CBC with Differential/Platelet    Standing Status: Standing     Number of Occurrences: 22     Standing Expiration Date: 01/30/2016  . Comprehensive metabolic panel    Standing Status: Standing     Number of Occurrences: 9     Standing Expiration Date: 01/30/2016  . Hold Tube, Blood Bank    Standing Status: Standing     Number of Occurrences: 9     Standing Expiration Date: 01/30/2016   All questions were answered. The patient knows to call the clinic with any problems, questions or concerns. No barriers to learning was detected. I spent 20 minutes counseling the patient face to face. The total time spent in the appointment was 30 minutes and more than 50% was on counseling and review of test results     [facility], [place], MD 01/30/2015 11:17 AM

## 2015-01-30 NOTE — Patient Instructions (Signed)
Platelet Transfusion Information °This is information about transfusions of platelets. Platelets are tiny cells made by the bone marrow and found in the blood. When a blood vessel is damaged, platelets rush to the damaged area to help form a clot. This begins the healing process. When platelets get very low, your blood may have trouble clotting. This may be from: °· Illness. °· Blood disorder. °· Chemotherapy to treat cancer. °Often, lower platelet counts do not cause problems.  °Platelets usually last for 7 to 10 days. If they are not used in an injury, they are broken down by the liver or spleen. °Symptoms of low platelet count include: °· Nosebleeds. °· Bleeding gums. °· Heavy periods. °· Bruising and tiny blood spots in the skin. °¨ Pinpoint spots of bleeding (petechiae). °¨ Larger bruises (purpura). °· Bleeding can be more serious if it happens in the brain or bowel. °Platelet transfusions are often used to keep the platelet count at an acceptable level. Serious bleeding due to low platelets is uncommon. °RISKS AND COMPLICATIONS °Severe side effects from platelet transfusions are uncommon. Minor reactions may include: °· Itching. °· Rashes. °· High temperature and shivering. °Medications are available to stop transfusion reactions. Let your health care provider know if you develop any of the above problems.  °If you are having platelet transfusions frequently, they may get less effective. This is called becoming refractory to platelets. It is uncommon. This can happen from non-immune causes and immune causes. Non-immune causes include: °· High temperatures. °· Some medications. °· An enlarged spleen. °Immune causes happen when your body discovers the platelets are not your own and begins making antibodies against them. The antibodies kill the platelets quickly. Even with platelet transfusions, you may still notice problems with bleeding or bruising. Let your health care providers know about this. Other things  can be done to help if this happens.  °BEFORE THE PROCEDURE  °· Your health care provider will check your platelet count regularly. °· If the platelet count is too low, it may be necessary to have a platelet transfusion. °· This is more important before certain procedures with a risk of bleeding, such as a spinal tap. °· Platelet transfusion reduces the risk of bleeding during or after the procedure. °· Except in emergencies, giving a transfusion requires a written consent. °Before blood is taken from a donor, a complete history is taken to make sure the person has no history of previous diseases, nor engages in risky social behavior. Examples of this are intravenous drug use or sexual activity with multiple partners. This could lead to infected blood or blood products being used. This history is taken in spite of the extensive testing to make sure the blood is safe. All blood products transfused are tested to make sure it is a match for the person getting the blood. It is also checked for infections. Blood is the safest it has ever been. The risk of getting an infection is very low. °PROCEDURE °· The platelets are stored in small plastic bags that are kept at a low temperature. °· Each bag is called a unit and sometimes two units are given. They are given through an intravenous line by drip infusion over about one-half hour. °· Usually blood is collected from multiple people to get enough to transfuse. °· Sometimes, the platelets are collected from a single person. This is done using a special machine that separates the platelets from the blood. The machine is called an apheresis machine. Platelets collected in this   way are called apheresed platelets. Apheresed platelets reduce the risk of becoming sensitive to the platelets. This lowers the chances of having a transfusion reaction. °· As it only takes a short time to give the platelets, this treatment can be given in an outpatient department. Platelets can also be  given before or after other treatments. °SEEK IMMEDIATE MEDICAL CARE IF: °You have any of the following symptoms over the next 12 hours or several days: °· Shaking chills. °· Fever with a temperature greater than 102°F (38.9°C) develops. °· Back pain or muscle pain. °· People around you feel you are not acting correctly, or you are confused. °· Blood in the urine or bowel movements, or bleeding from any place in your body. °· Shortness of breath, or difficulty breathing. °· Dizziness. °· Fainting. °· You break out in a rash or develop hives. °· Decrease in the amount of urine you are putting out, or the urine turns a dark color or changes to pink, red, or brown. °· A severe headache or stiff neck. °· Bruising more easily. °Document Released: 05/29/2007 Document Revised: 12/16/2013 Document Reviewed: 05/29/2007 °[employer]® Patient Information ©2015 [employer]. This information is not intended to replace advice given to you by your health care provider. Make sure you discuss any questions you have with your health care provider. ° °

## 2015-01-30 NOTE — Assessment & Plan Note (Signed)
This is likely anemia of chronic disease and related to her bone marrow disorder. The patient denies recent history of bleeding such as epistaxis, hematuria or hematochezia. She is asymptomatic from the anemia. We will observe for now.  She does not require transfusion now.  She will get 2 units of blood whenever hemoglobin dropped to less than 8 g. She will continue on same dose Revlimid for now and will return every other week for blood work monitoring.

## 2015-01-30 NOTE — Assessment & Plan Note (Signed)
We discussed some of the risks, benefits, and alternatives of platelets transfusions. The patient is symptomatic from low platelet counts with at high risk of life-threatening bleeding and the platelet count is critically low.   She also needs dental extraction today. I will proceed with 1 unit of platelet transfusion. Some of the side-effects to be expected including risks of transfusion reactions, chills, infection, syndrome of volume overload and risk of hospitalization from various reasons and the patient is willing to proceed and went ahead to sign consent today.

## 2015-02-01 LAB — PREPARE PLATELET PHERESIS: Unit division: 0

## 2015-02-02 ENCOUNTER — Other Ambulatory Visit: Payer: Medicare Other

## 2015-02-02 ENCOUNTER — Other Ambulatory Visit: Payer: Self-pay | Admitting: *Deleted

## 2015-02-02 ENCOUNTER — Telehealth: Payer: Self-pay | Admitting: Hematology and Oncology

## 2015-02-02 NOTE — Telephone Encounter (Signed)
returned call and lvm for pt confirming that appt cx

## 2015-02-06 ENCOUNTER — Telehealth: Payer: Self-pay

## 2015-02-06 ENCOUNTER — Other Ambulatory Visit: Payer: Self-pay | Admitting: *Deleted

## 2015-02-06 MED ORDER — LENALIDOMIDE 2.5 MG PO CAPS: 2.5000 mg | ORAL_CAPSULE | Freq: Every day | ORAL | Status: DC

## 2015-02-06 NOTE — Telephone Encounter (Signed)
revlimid refill request to Dr Alvy Bimler RN

## 2015-02-09 ENCOUNTER — Telehealth: Payer: Self-pay | Admitting: *Deleted

## 2015-02-09 ENCOUNTER — Other Ambulatory Visit: Payer: Self-pay | Admitting: *Deleted

## 2015-02-09 MED ORDER — LENALIDOMIDE 2.5 MG PO CAPS: 2.5000 mg | ORAL_CAPSULE | Freq: Every day | ORAL | Status: DC

## 2015-02-09 NOTE — Telephone Encounter (Signed)
Dr Alvy Bimler will do new RX on 6/30 at patient's next office visit

## 2015-02-09 NOTE — Telephone Encounter (Signed)
THE REVLIMID PRESCRIPTION RECEIVED 02/06/15 WAS DATED BY [facility] FOR 02/05/13. [facility] IS REQUESTING A NEW PRESCRIPTION FOR REVLIMID DATED 02/06/15.

## 2015-02-11 NOTE — Telephone Encounter (Signed)
Tonya Mckee with [facility] called requesting "new script for Revlimid be faxed with date correction.  We cannot make changes on prescriptions."  Notified her this was done and faxed on 02-09-2015.  No record of this and requested re-fax.  Will notify collaborative nurse.

## 2015-02-12 ENCOUNTER — Ambulatory Visit (HOSPITAL_BASED_OUTPATIENT_CLINIC_OR_DEPARTMENT_OTHER): Payer: Medicare Other

## 2015-02-12 ENCOUNTER — Ambulatory Visit (HOSPITAL_BASED_OUTPATIENT_CLINIC_OR_DEPARTMENT_OTHER): Payer: Medicare Other | Admitting: Hematology and Oncology

## 2015-02-12 ENCOUNTER — Other Ambulatory Visit (HOSPITAL_BASED_OUTPATIENT_CLINIC_OR_DEPARTMENT_OTHER): Payer: Medicare Other

## 2015-02-12 ENCOUNTER — Other Ambulatory Visit: Payer: Self-pay | Admitting: *Deleted

## 2015-02-12 ENCOUNTER — Telehealth: Payer: Self-pay | Admitting: *Deleted

## 2015-02-12 ENCOUNTER — Telehealth: Payer: Self-pay | Admitting: Hematology and Oncology

## 2015-02-12 VITALS — BP 131/77 | HR 61 | Temp 98.2°F | Resp 19 | Ht 62.0 in | Wt 110.2 lb

## 2015-02-12 VITALS — BP 173/60 | HR 59 | Temp 97.8°F | Resp 18

## 2015-02-12 DIAGNOSIS — D630 Anemia in neoplastic disease: Secondary | ICD-10-CM

## 2015-02-12 DIAGNOSIS — D6959 Other secondary thrombocytopenia: Secondary | ICD-10-CM

## 2015-02-12 DIAGNOSIS — D46C Myelodysplastic syndrome with isolated del(5q) chromosomal abnormality: Secondary | ICD-10-CM

## 2015-02-12 DIAGNOSIS — T50905A Adverse effect of unspecified drugs, medicaments and biological substances, initial encounter: Secondary | ICD-10-CM

## 2015-02-12 LAB — COMPREHENSIVE METABOLIC PANEL (CC13)
ALT: 6 U/L (ref 0–55)
AST: 12 U/L (ref 5–34)
Albumin: 3.2 g/dL — ABNORMAL LOW (ref 3.5–5.0)
Alkaline Phosphatase: 61 U/L (ref 40–150)
Anion Gap: 9 mEq/L (ref 3–11)
BILIRUBIN TOTAL: 0.67 mg/dL (ref 0.20–1.20)
BUN: 18.6 mg/dL (ref 7.0–26.0)
CHLORIDE: 108 meq/L (ref 98–109)
CO2: 25 mEq/L (ref 22–29)
Calcium: 8.9 mg/dL (ref 8.4–10.4)
Creatinine: 0.9 mg/dL (ref 0.6–1.1)
EGFR: 59 mL/min/{1.73_m2} — AB (ref >90)
Glucose: 102 mg/dl (ref 70–140)
Potassium: 4.9 mEq/L (ref 3.5–5.1)
Sodium: 142 mEq/L (ref 136–145)
Total Protein: 6.5 g/dL (ref 6.4–8.3)

## 2015-02-12 LAB — CBC WITH DIFFERENTIAL/PLATELET
HEMATOCRIT: 21.1 % — AB (ref 34.8–46.6)
HGB: 6.8 g/dL — CL (ref 11.6–15.9)
MCH: 32.2 pg (ref 25.1–34.0)
MCHC: 32.4 g/dL (ref 31.5–36.0)
MCV: 99.6 fL (ref 79.5–101.0)
PLATELETS: 47 10*3/uL — AB (ref 145–400)
RBC: 2.12 10*6/uL — ABNORMAL LOW (ref 3.70–5.45)
RDW: 27.7 % — ABNORMAL HIGH (ref 11.2–14.5)
WBC: 5.4 10*3/uL (ref 3.9–10.3)

## 2015-02-12 LAB — MANUAL DIFFERENTIAL
ALC: 2.6 10*3/uL (ref 0.9–3.3)
ANC (CHCC MAN DIFF): 1.4 10*3/uL — AB (ref 1.5–6.5)
BASOPHIL: 7 % — AB (ref 0–2)
Band Neutrophils: 5 % (ref 0–10)
Blasts: 0 % (ref 0–0)
EOS%: 1 % (ref 0–7)
LYMPH: 48 % (ref 14–49)
MONO: 19 % — AB (ref 0–14)
MYELOCYTES: 1 % — AB (ref 0–0)
Metamyelocytes: 0 % (ref 0–0)
OTHER CELL: 0 % (ref 0–0)
PLT EST: DECREASED
PROMYELO: 0 % (ref 0–0)
SEG: 19 % — ABNORMAL LOW (ref 38–77)
VARIANT LYMPH: 0 % (ref 0–0)
nRBC: 0 % (ref 0–0)

## 2015-02-12 LAB — HOLD TUBE, BLOOD BANK

## 2015-02-12 MED ORDER — FUROSEMIDE 10 MG/ML IJ SOLN
INTRAMUSCULAR | Status: AC
  Filled 2015-02-12: qty 2

## 2015-02-12 MED ORDER — ACETAMINOPHEN 325 MG PO TABS
650.0000 mg | ORAL_TABLET | Freq: Once | ORAL | Status: AC
  Administered 2015-02-12: 650 mg via ORAL

## 2015-02-12 MED ORDER — ACETAMINOPHEN 325 MG PO TABS
ORAL_TABLET | ORAL | Status: AC
  Filled 2015-02-12: qty 2

## 2015-02-12 MED ORDER — DIPHENHYDRAMINE HCL 25 MG PO CAPS
25.0000 mg | ORAL_CAPSULE | Freq: Once | ORAL | Status: AC
  Administered 2015-02-12: 25 mg via ORAL

## 2015-02-12 MED ORDER — DIPHENHYDRAMINE HCL 25 MG PO CAPS
ORAL_CAPSULE | ORAL | Status: AC
  Filled 2015-02-12: qty 1

## 2015-02-12 MED ORDER — SODIUM CHLORIDE 0.9 % IV SOLN
250.0000 mL | Freq: Once | INTRAVENOUS | Status: AC
  Administered 2015-02-12: 250 mL via INTRAVENOUS

## 2015-02-12 MED ORDER — FUROSEMIDE 10 MG/ML IJ SOLN
20.0000 mg | Freq: Once | INTRAMUSCULAR | Status: AC
  Administered 2015-02-12: 20 mg via INTRAVENOUS

## 2015-02-12 NOTE — Telephone Encounter (Signed)
per pof to sch pt appt-sent MW email to sch blood-adv pt IR will call to sch port

## 2015-02-12 NOTE — Assessment & Plan Note (Addendum)
This is likely anemia of chronic disease and related to her bone marrow disorder. The patient denies recent history of bleeding such as epistaxis, hematuria or hematochezia. She will get 2 units of blood whenever hemoglobin dropped to less than 8 g We discussed some of the risks, benefits, and alternatives of blood transfusions. The patient is symptomatic from anemia and the hemoglobin level is critically low.  Some of the side-effects to be expected including risks of transfusion reactions, chills, infection, syndrome of volume overload and risk of hospitalization from various reasons and the patient is willing to proceed and went ahead to sign consent today.

## 2015-02-12 NOTE — Progress Notes (Signed)
[facility] OFFICE PROGRESS NOTE  Patient Care Team: Carlos Levering, PA-C as PCP - General (Family Medicine) Lennon Alstrom, MD (Neurology) Carlos Levering, PA-C as Referring Physician (Family Medicine) Heath Lark, MD as Consulting Physician (Hematology and Oncology)  SUMMARY OF ONCOLOGIC HISTORY:  She was found to have abnormal CBC from routine blood work. Starting around 2012, she has progressive anemia with hemoglobin and the lowest at 9.1.  She denies recent chest pain on exertion but complains of significant shortness of breath on minimal exertion. She has had pre-syncopal episodes and palpitations. She has profound fatigue and takes frequent naps. Recently, she had a fall.  Of note, the patient has 60 pack plus year history and recently was placed on oxygen therapy.  She never has EGD or screening colonoscopy.  She has been taking vitamin B12 supplements for a long time.  She has remote history of left breast cancer detected from mammogram. According to the patient, she had lumpectomy followed by radiation followed by tamoxifen. She does not know the stage of disease but she does not think the lymph nodes were involved.  The patient was prescribed oral iron supplements and she takes them regularly.  On 12/27/13: Bone marrow biopsy did not show evidence of leukemia or metastatic cancer. Cytogenetics and FISH analysis confirm myelodysplastic syndrome with deletion 5Q. The patient was started on erythropoietin stimulating agent to keep hemoglobin greater than 10 g. On 04/15/14: CT head was negative On 04/24/2014, we discontinued darbepoetin as the patient complained of side effects. She was started on prednisone 10 mg daily. On 05/22/14: Prednisone is tapered to 5 mg. On 06/16/2014, she started on Revlimid 2.5 mg daily. From 09/15/2014 to 09/17/2014, she was hospitalized due to acute fall. She was subsequently discharged to rehabilitation facility and her treatment was placed on  hold. On 10/14/2014, she received 1 L IV fluids due to weakness and hypotension On 10/20/2014, she resume Revlimid 2.5 mg daily along with 2.5 mg of prednisone On 12/09/2014, she had dental extraction for dental abscess On 01/30/15: she had complete dental extraction of all remaining teeth  INTERVAL HISTORY: Please see below for problem oriented charting. She complained of profound fatigue. The patient denies any recent signs or symptoms of bleeding such as spontaneous epistaxis, hematuria or hematochezia. Denies chest pain or dyspnea  REVIEW OF SYSTEMS:   Constitutional: Denies fevers, chills or abnormal weight loss Eyes: Denies blurriness of vision Ears, nose, mouth, throat, and face: Denies mucositis or sore throat Respiratory: Denies cough, dyspnea or wheezes Cardiovascular: Denies palpitation, chest discomfort or lower extremity swelling Gastrointestinal:  Denies nausea, heartburn or change in bowel habits Skin: Denies abnormal skin rashes Lymphatics: Denies new lymphadenopathy or easy bruising Neurological:Denies numbness, tingling or new weaknesses Behavioral/Psych: Mood is stable, no new changes  All other systems were reviewed with the patient and are negative.  I have reviewed the past medical history, past surgical history, social history and family history with the patient and they are unchanged from previous note.  ALLERGIES:  is allergic to donepezil; penicillins; amoxapine and related; doxycycline; keflex; bupropion; ketek; levaquin; sulfa antibiotics; tramadol; and zyrtec.  MEDICATIONS:  Current Outpatient Prescriptions  Medication Sig Dispense Refill  . acetaminophen (TYLENOL) 325 MG tablet Take 650 mg by mouth every 6 (six) hours as needed for moderate pain (pain).     Marland Kitchen albuterol (PROVENTIL HFA;VENTOLIN HFA) 108 (90 BASE) MCG/ACT inhaler Inhale 2 puffs into the lungs every 6 (six) hours as needed for wheezing or shortness  of breath (wheezing).     . calcium  carbonate (TUMS - DOSED IN MG ELEMENTAL CALCIUM) 500 MG chewable tablet Chew 1 tablet by mouth daily as needed for indigestion or heartburn.    . chlorhexidine (PERIDEX) 0.12 % solution 15 mLs by Mouth Rinse route 2 (two) times daily. 120 mL 0  . Cholecalciferol (VITAMIN D) 2000 UNITS tablet Take 2,000 Units by mouth daily.    . famotidine (PEPCID AC) 10 MG tablet Take 1 tablet (10 mg total) by mouth 2 (two) times daily. 30 tablet 0  . FLUoxetine (PROZAC) 10 MG capsule Take 3 capsules (30 mg total) by mouth every morning. 30 capsule 0  . fluticasone (FLONASE) 50 MCG/ACT nasal spray Place 1 spray into the nose daily as needed for allergies (allergies).     Marland Kitchen ipratropium-albuterol (DUONEB) 0.5-2.5 (3) MG/3ML SOLN Take 3 mLs by nebulization 2 (two) times daily as needed (wheezing). 360 mL 0  . lenalidomide (REVLIMID) 2.5 MG capsule Take 1 capsule (2.5 mg total) by mouth daily. 28 capsule 0  . Liniments (SALONPAS EX) Apply 1 patch topically daily.    Marland Kitchen LORazepam (ATIVAN) 0.5 MG tablet Take 1 tablet (0.5 mg total) by mouth every 8 (eight) hours as needed for anxiety (anxiety). 30 tablet 0  . menthol-cetylpyridinium (CEPACOL) 3 MG lozenge Take 1 lozenge (3 mg total) by mouth as needed for sore throat. 30 tablet 0  . metoprolol succinate (TOPROL-XL) 50 MG 24 hr tablet Take 50 mg by mouth every evening. Take with or immediately following a meal.    . ondansetron (ZOFRAN) 4 MG tablet Take 1 tablet (4 mg total) by mouth every 6 (six) hours as needed for nausea. 20 tablet 0  . polyethylene glycol (MIRALAX / GLYCOLAX) packet Take 17 g by mouth daily. 14 each 0  . predniSONE (DELTASONE) 2.5 MG tablet Take 1 tablet (2.5 mg total) by mouth daily with breakfast. 30 tablet 3  . tiotropium (SPIRIVA) 18 MCG inhalation capsule Place 18 mcg into inhaler and inhale daily.    . valsartan (DIOVAN) 160 MG tablet Take 1 tablet (160 mg total) by mouth daily. 30 tablet 11   No current facility-administered medications for  this visit.    PHYSICAL EXAMINATION: ECOG PERFORMANCE STATUS: 2 - Symptomatic, <50% confined to bed  Filed Vitals:   02/12/15 1214  BP: 131/77  Pulse: 61  Temp: 98.2 F (36.8 C)  Resp: 19   Filed Weights   02/12/15 1214  Weight: 110 lb 3.2 oz (49.986 kg)    GENERAL:alert, no distress and comfortable SKIN: skin color is pale, texture, turgor are normal, no rashes or significant lesions EYES: normal, Conjunctiva are pale and non-injected, sclera clear OROPHARYNX:no exudate, no erythema and lips, buccal mucosa, and tongue normal  NECK: supple, thyroid normal size, non-tender, without nodularity LYMPH:  no palpable lymphadenopathy in the cervical, axillary or inguinal LUNGS: clear to auscultation and percussion with normal breathing effort HEART: regular rate & rhythm and no murmurs and no lower extremity edema ABDOMEN:abdomen soft, non-tender and normal bowel sounds Musculoskeletal:no cyanosis of digits and no clubbing  NEURO: alert & oriented x 3 with fluent speech, no focal motor/sensory deficits  LABORATORY DATA:  I have reviewed the data as listed    Component Value Date/Time   NA 142 02/12/2015 1141   NA 136 09/16/2014 0500   K 4.9 02/12/2015 1141   K 3.7 09/16/2014 0500   CL 102 09/16/2014 0500   CO2 25 02/12/2015 1141  CO2 25 09/16/2014 0500   GLUCOSE 102 02/12/2015 1141   GLUCOSE 103* 09/16/2014 0500   BUN 18.6 02/12/2015 1141   BUN 11 09/16/2014 0500   CREATININE 0.9 02/12/2015 1141   CREATININE 0.62 09/16/2014 0500   CALCIUM 8.9 02/12/2015 1141   CALCIUM 7.8* 09/16/2014 0500   PROT 6.5 02/12/2015 1141   PROT 6.9 09/15/2014 1331   ALBUMIN 3.2* 02/12/2015 1141   ALBUMIN 3.4* 09/15/2014 1331   AST 12 02/12/2015 1141   AST 15 09/15/2014 1331   ALT 6 02/12/2015 1141   ALT 8 09/15/2014 1331   ALKPHOS 61 02/12/2015 1141   ALKPHOS 65 09/15/2014 1331   BILITOT 0.67 02/12/2015 1141   BILITOT 0.8 09/15/2014 1331   GFRNONAA 81* 09/16/2014 0500   GFRAA >90  09/16/2014 0500    No results found for: SPEP, UPEP  Lab Results  Component Value Date   WBC 5.4 02/12/2015   NEUTROABS 2.3 12/19/2014   HGB 6.8* 02/12/2015   HCT 21.1* 02/12/2015   MCV 99.6 02/12/2015   PLT 47* 02/12/2015      Chemistry      Component Value Date/Time   NA 142 02/12/2015 1141   NA 136 09/16/2014 0500   K 4.9 02/12/2015 1141   K 3.7 09/16/2014 0500   CL 102 09/16/2014 0500   CO2 25 02/12/2015 1141   CO2 25 09/16/2014 0500   BUN 18.6 02/12/2015 1141   BUN 11 09/16/2014 0500   CREATININE 0.9 02/12/2015 1141   CREATININE 0.62 09/16/2014 0500      Component Value Date/Time   CALCIUM 8.9 02/12/2015 1141   CALCIUM 7.8* 09/16/2014 0500   ALKPHOS 61 02/12/2015 1141   ALKPHOS 65 09/15/2014 1331   AST 12 02/12/2015 1141   AST 15 09/15/2014 1331   ALT 6 02/12/2015 1141   ALT 8 09/15/2014 1331   BILITOT 0.67 02/12/2015 1141   BILITOT 0.8 09/15/2014 1331       ASSESSMENT & PLAN:  MDS (myelodysplastic syndrome) with 5q deletion She has profound bone marrow suppression and pancytopenia due to recent dental infection. At present time, she will continue to hold Revlimid and remain on prednisone. We will continue transfusion support and blood work monitoring weekly. I'll see her back in 2 weeks to reassess and possibly resume Revlimid if her blood counts improved. I recommend port placement due to poor venous access I will give her platelet transfusion prior to port placement due to low platelet count  Anemia in neoplastic disease This is likely anemia of chronic disease and related to her bone marrow disorder. The patient denies recent history of bleeding such as epistaxis, hematuria or hematochezia. She will get 2 units of blood whenever hemoglobin dropped to less than 8 g We discussed some of the risks, benefits, and alternatives of blood transfusions. The patient is symptomatic from anemia and the hemoglobin level is critically low.  Some of the side-effects  to be expected including risks of transfusion reactions, chills, infection, syndrome of volume overload and risk of hospitalization from various reasons and the patient is willing to proceed and went ahead to sign consent today.   Thrombocytopenia due to drugs This is likely due to recent treatment. The patient denies recent history of bleeding such as epistaxis, hematuria or hematochezia. She is asymptomatic from the low platelet count. I will observe for now.  She will need pre-operative platelet transfusion before port placement      Orders Placed This Encounter  Procedures  .  IR Fluoro Guide CV Line Right    Low platelets, will transfuse platelets in the morning of 7/11 prior to port    Standing Status: Future     Number of Occurrences:      Standing Expiration Date: 04/13/2016    Order Specific Question:  Reason for exam:    Answer:  need port for transfusions    Order Specific Question:  Preferred Imaging Location?    Answer:  [place] [facility]    Standing Status: Standing     Number of Occurrences: 22     Standing Expiration Date: 02/12/2016   All questions were answered. The patient knows to call the clinic with any problems, questions or concerns. No barriers to learning was detected. I spent 30 minutes counseling the patient face to face. The total time spent in the appointment was 40 minutes and more than 50% was on counseling and review of test results     [facility], , MD 02/12/2015 11:00 PM

## 2015-02-12 NOTE — Assessment & Plan Note (Signed)
She has profound bone marrow suppression and pancytopenia due to recent dental infection. At present time, she will continue to hold Revlimid and remain on prednisone. We will continue transfusion support and blood work monitoring weekly. I'll see her back in 2 weeks to reassess and possibly resume Revlimid if her blood counts improved. I recommend port placement due to poor venous access I will give her platelet transfusion prior to port placement due to low platelet count

## 2015-02-12 NOTE — Patient Instructions (Signed)

## 2015-02-12 NOTE — Assessment & Plan Note (Signed)
This is likely due to recent treatment. The patient denies recent history of bleeding such as epistaxis, hematuria or hematochezia. She is asymptomatic from the low platelet count. I will observe for now.  She will need pre-operative platelet transfusion before port placement

## 2015-02-12 NOTE — Telephone Encounter (Signed)
Per staff message and POF I have scheduled appts. Advised scheduler of appts. JMW  

## 2015-02-13 ENCOUNTER — Other Ambulatory Visit: Payer: Self-pay | Admitting: *Deleted

## 2015-02-13 ENCOUNTER — Ambulatory Visit (HOSPITAL_COMMUNITY)
Admission: RE | Admit: 2015-02-13 | Discharge: 2015-02-13 | Disposition: A | Discharge status: Home / Self Care | Payer: Medicare Other | Source: Ambulatory Visit | Attending: Hematology and Oncology | Admitting: Hematology and Oncology

## 2015-02-13 LAB — TYPE AND SCREEN
ABO/RH(D): O POS
Antibody Screen: NEGATIVE
Unit division: 0
Unit division: 0

## 2015-02-17 ENCOUNTER — Other Ambulatory Visit: Payer: Self-pay

## 2015-02-17 ENCOUNTER — Encounter (HOSPITAL_COMMUNITY): Payer: Self-pay | Admitting: *Deleted

## 2015-02-17 ENCOUNTER — Inpatient Hospital Stay (HOSPITAL_COMMUNITY)
Admission: EM | Admit: 2015-02-17 | Discharge: 2015-02-24 | DRG: 309 | Disposition: A | Discharge status: Skilled Nursing Facility | Payer: Medicare Other | Attending: Family Medicine | Admitting: Family Medicine

## 2015-02-17 DIAGNOSIS — D6959 Other secondary thrombocytopenia: Secondary | ICD-10-CM | POA: Diagnosis present

## 2015-02-17 DIAGNOSIS — R531 Weakness: Secondary | ICD-10-CM

## 2015-02-17 DIAGNOSIS — Z880 Allergy status to penicillin: Secondary | ICD-10-CM

## 2015-02-17 DIAGNOSIS — I4892 Unspecified atrial flutter: Secondary | ICD-10-CM | POA: Diagnosis present

## 2015-02-17 DIAGNOSIS — I4891 Unspecified atrial fibrillation: Secondary | ICD-10-CM | POA: Diagnosis present

## 2015-02-17 DIAGNOSIS — J9610 Chronic respiratory failure, unspecified whether with hypoxia or hypercapnia: Secondary | ICD-10-CM | POA: Diagnosis present

## 2015-02-17 DIAGNOSIS — T50905A Adverse effect of unspecified drugs, medicaments and biological substances, initial encounter: Secondary | ICD-10-CM

## 2015-02-17 DIAGNOSIS — Z87891 Personal history of nicotine dependence: Secondary | ICD-10-CM

## 2015-02-17 DIAGNOSIS — R509 Fever, unspecified: Secondary | ICD-10-CM | POA: Diagnosis not present

## 2015-02-17 DIAGNOSIS — Z79899 Other long term (current) drug therapy: Secondary | ICD-10-CM

## 2015-02-17 DIAGNOSIS — Z90710 Acquired absence of both cervix and uterus: Secondary | ICD-10-CM

## 2015-02-17 DIAGNOSIS — K219 Gastro-esophageal reflux disease without esophagitis: Secondary | ICD-10-CM | POA: Diagnosis present

## 2015-02-17 DIAGNOSIS — F419 Anxiety disorder, unspecified: Secondary | ICD-10-CM | POA: Diagnosis present

## 2015-02-17 DIAGNOSIS — I480 Paroxysmal atrial fibrillation: Principal | ICD-10-CM | POA: Diagnosis present

## 2015-02-17 DIAGNOSIS — Z888 Allergy status to other drugs, medicaments and biological substances status: Secondary | ICD-10-CM

## 2015-02-17 DIAGNOSIS — J441 Chronic obstructive pulmonary disease with (acute) exacerbation: Secondary | ICD-10-CM | POA: Diagnosis present

## 2015-02-17 DIAGNOSIS — R51 Headache: Secondary | ICD-10-CM | POA: Diagnosis present

## 2015-02-17 DIAGNOSIS — Z720 Tobacco use: Secondary | ICD-10-CM | POA: Diagnosis present

## 2015-02-17 DIAGNOSIS — Z882 Allergy status to sulfonamides status: Secondary | ICD-10-CM

## 2015-02-17 DIAGNOSIS — J449 Chronic obstructive pulmonary disease, unspecified: Secondary | ICD-10-CM | POA: Diagnosis present

## 2015-02-17 DIAGNOSIS — R519 Headache, unspecified: Secondary | ICD-10-CM | POA: Diagnosis present

## 2015-02-17 DIAGNOSIS — S32009A Unspecified fracture of unspecified lumbar vertebra, initial encounter for closed fracture: Secondary | ICD-10-CM | POA: Diagnosis present

## 2015-02-17 DIAGNOSIS — M1990 Unspecified osteoarthritis, unspecified site: Secondary | ICD-10-CM | POA: Diagnosis present

## 2015-02-17 DIAGNOSIS — Z8673 Personal history of transient ischemic attack (TIA), and cerebral infarction without residual deficits: Secondary | ICD-10-CM

## 2015-02-17 DIAGNOSIS — I5033 Acute on chronic diastolic (congestive) heart failure: Secondary | ICD-10-CM

## 2015-02-17 DIAGNOSIS — D46C Myelodysplastic syndrome with isolated del(5q) chromosomal abnormality: Secondary | ICD-10-CM | POA: Diagnosis present

## 2015-02-17 DIAGNOSIS — Z9181 History of falling: Secondary | ICD-10-CM

## 2015-02-17 DIAGNOSIS — F329 Major depressive disorder, single episode, unspecified: Secondary | ICD-10-CM | POA: Diagnosis present

## 2015-02-17 DIAGNOSIS — K047 Periapical abscess without sinus: Secondary | ICD-10-CM | POA: Diagnosis present

## 2015-02-17 DIAGNOSIS — R42 Dizziness and giddiness: Secondary | ICD-10-CM

## 2015-02-17 DIAGNOSIS — I10 Essential (primary) hypertension: Secondary | ICD-10-CM | POA: Diagnosis present

## 2015-02-17 DIAGNOSIS — I495 Sick sinus syndrome: Secondary | ICD-10-CM | POA: Diagnosis present

## 2015-02-17 DIAGNOSIS — Z7952 Long term (current) use of systemic steroids: Secondary | ICD-10-CM

## 2015-02-17 NOTE — ED Notes (Signed)
Bed: DJ49 Expected date:  Expected time:  Means of arrival:  Comments: EMS female/weakness/O2 sat 88% RA

## 2015-02-17 NOTE — ED Notes (Signed)
Per EMS, [employer] was called to pt's home when the pt's daughter could not contact her. Pt was found alert laying on the couch. Pt states she has been unable to get up from the couch for an undetermined time, possibly longer than 24 hours. Pt was lying on couch long enough to urinate on self. Pt complains of headache and weakness. Pt has hx of anemia. Pt was 87% on RA upon EMS arrival.

## 2015-02-18 ENCOUNTER — Inpatient Hospital Stay (HOSPITAL_COMMUNITY): Payer: Medicare Other

## 2015-02-18 ENCOUNTER — Telehealth: Payer: Self-pay | Admitting: *Deleted

## 2015-02-18 ENCOUNTER — Emergency Department (HOSPITAL_COMMUNITY): Payer: Medicare Other

## 2015-02-18 DIAGNOSIS — R509 Fever, unspecified: Secondary | ICD-10-CM | POA: Diagnosis present

## 2015-02-18 DIAGNOSIS — K219 Gastro-esophageal reflux disease without esophagitis: Secondary | ICD-10-CM | POA: Diagnosis present

## 2015-02-18 DIAGNOSIS — K047 Periapical abscess without sinus: Secondary | ICD-10-CM | POA: Diagnosis present

## 2015-02-18 DIAGNOSIS — M1990 Unspecified osteoarthritis, unspecified site: Secondary | ICD-10-CM | POA: Diagnosis present

## 2015-02-18 DIAGNOSIS — J441 Chronic obstructive pulmonary disease with (acute) exacerbation: Secondary | ICD-10-CM | POA: Insufficient documentation

## 2015-02-18 DIAGNOSIS — I10 Essential (primary) hypertension: Secondary | ICD-10-CM | POA: Diagnosis present

## 2015-02-18 DIAGNOSIS — J9611 Chronic respiratory failure with hypoxia: Secondary | ICD-10-CM

## 2015-02-18 DIAGNOSIS — I495 Sick sinus syndrome: Secondary | ICD-10-CM | POA: Diagnosis present

## 2015-02-18 DIAGNOSIS — J9610 Chronic respiratory failure, unspecified whether with hypoxia or hypercapnia: Secondary | ICD-10-CM | POA: Diagnosis present

## 2015-02-18 DIAGNOSIS — Z882 Allergy status to sulfonamides status: Secondary | ICD-10-CM | POA: Diagnosis not present

## 2015-02-18 DIAGNOSIS — R51 Headache: Secondary | ICD-10-CM | POA: Diagnosis present

## 2015-02-18 DIAGNOSIS — I4892 Unspecified atrial flutter: Secondary | ICD-10-CM | POA: Diagnosis present

## 2015-02-18 DIAGNOSIS — R531 Weakness: Secondary | ICD-10-CM

## 2015-02-18 DIAGNOSIS — R42 Dizziness and giddiness: Secondary | ICD-10-CM

## 2015-02-18 DIAGNOSIS — Z87891 Personal history of nicotine dependence: Secondary | ICD-10-CM | POA: Diagnosis not present

## 2015-02-18 DIAGNOSIS — D630 Anemia in neoplastic disease: Secondary | ICD-10-CM | POA: Diagnosis not present

## 2015-02-18 DIAGNOSIS — Z888 Allergy status to other drugs, medicaments and biological substances status: Secondary | ICD-10-CM | POA: Diagnosis not present

## 2015-02-18 DIAGNOSIS — F419 Anxiety disorder, unspecified: Secondary | ICD-10-CM | POA: Diagnosis present

## 2015-02-18 DIAGNOSIS — J449 Chronic obstructive pulmonary disease, unspecified: Secondary | ICD-10-CM | POA: Diagnosis not present

## 2015-02-18 DIAGNOSIS — F329 Major depressive disorder, single episode, unspecified: Secondary | ICD-10-CM | POA: Diagnosis present

## 2015-02-18 DIAGNOSIS — D6959 Other secondary thrombocytopenia: Secondary | ICD-10-CM | POA: Diagnosis present

## 2015-02-18 DIAGNOSIS — Z8673 Personal history of transient ischemic attack (TIA), and cerebral infarction without residual deficits: Secondary | ICD-10-CM | POA: Diagnosis not present

## 2015-02-18 DIAGNOSIS — Z79899 Other long term (current) drug therapy: Secondary | ICD-10-CM | POA: Diagnosis not present

## 2015-02-18 DIAGNOSIS — Z90710 Acquired absence of both cervix and uterus: Secondary | ICD-10-CM | POA: Diagnosis not present

## 2015-02-18 DIAGNOSIS — D46C Myelodysplastic syndrome with isolated del(5q) chromosomal abnormality: Secondary | ICD-10-CM | POA: Diagnosis present

## 2015-02-18 DIAGNOSIS — I480 Paroxysmal atrial fibrillation: Secondary | ICD-10-CM | POA: Diagnosis present

## 2015-02-18 DIAGNOSIS — Z880 Allergy status to penicillin: Secondary | ICD-10-CM | POA: Diagnosis not present

## 2015-02-18 DIAGNOSIS — Z7952 Long term (current) use of systemic steroids: Secondary | ICD-10-CM | POA: Diagnosis not present

## 2015-02-18 DIAGNOSIS — Z9181 History of falling: Secondary | ICD-10-CM | POA: Diagnosis not present

## 2015-02-18 LAB — COMPREHENSIVE METABOLIC PANEL
ALT: 7 U/L — ABNORMAL LOW (ref 14–54)
ALT: 8 U/L — AB (ref 14–54)
ANION GAP: 7 (ref 5–15)
ANION GAP: 9 (ref 5–15)
AST: 13 U/L — ABNORMAL LOW (ref 15–41)
AST: 16 U/L (ref 15–41)
Albumin: 3 g/dL — ABNORMAL LOW (ref 3.5–5.0)
Albumin: 2.9 g/dL — ABNORMAL LOW (ref 3.5–5.0)
Alkaline Phosphatase: 49 U/L (ref 38–126)
Alkaline Phosphatase: 44 U/L (ref 38–126)
BILIRUBIN TOTAL: 1.8 mg/dL — AB (ref 0.3–1.2)
BUN: 17 mg/dL (ref 6–20)
BUN: 18 mg/dL (ref 6–20)
CALCIUM: 8.3 mg/dL — AB (ref 8.9–10.3)
CO2: 24 mmol/L (ref 22–32)
CO2: 23 mmol/L (ref 22–32)
CREATININE: 0.73 mg/dL (ref 0.44–1.00)
CREATININE: 0.8 mg/dL (ref 0.44–1.00)
Calcium: 7.9 mg/dL — ABNORMAL LOW (ref 8.9–10.3)
Chloride: 103 mmol/L (ref 101–111)
Chloride: 106 mmol/L (ref 101–111)
GFR calc Af Amer: >60 mL/min (ref >60)
GFR calc non Af Amer: >60 mL/min (ref >60)
GLUCOSE: 104 mg/dL — AB (ref 65–99)
Glucose, Bld: 111 mg/dL — ABNORMAL HIGH (ref 65–99)
Potassium: 3.6 mmol/L (ref 3.5–5.1)
Potassium: 3.3 mmol/L — ABNORMAL LOW (ref 3.5–5.1)
Sodium: 136 mmol/L (ref 135–145)
Sodium: 136 mmol/L (ref 135–145)
Total Bilirubin: 1.7 mg/dL — ABNORMAL HIGH (ref 0.3–1.2)
Total Protein: 7.1 g/dL (ref 6.5–8.1)
Total Protein: 6.1 g/dL — ABNORMAL LOW (ref 6.5–8.1)

## 2015-02-18 LAB — CBC
HCT: 27 % — ABNORMAL LOW (ref 36.0–46.0)
Hemoglobin: 8.9 g/dL — ABNORMAL LOW (ref 12.0–15.0)
MCH: 31.8 pg (ref 26.0–34.0)
MCHC: 33 g/dL (ref 30.0–36.0)
MCV: 96.4 fL (ref 78.0–100.0)
Platelets: 27 10*3/uL — CL (ref 150–400)
RBC: 2.8 MIL/uL — AB (ref 3.87–5.11)
RDW: 21.6 % — ABNORMAL HIGH (ref 11.5–15.5)
WBC: 9.5 10*3/uL (ref 4.0–10.5)

## 2015-02-18 LAB — CBC WITH DIFFERENTIAL/PLATELET
BASOS ABS: 0 10*3/uL (ref 0.0–0.1)
BASOS PCT: 0 % (ref 0–1)
Eosinophils Absolute: 0.1 10*3/uL (ref 0.0–0.7)
Eosinophils Relative: 1 % (ref 0–5)
HCT: 23.6 % — ABNORMAL LOW (ref 36.0–46.0)
HEMOGLOBIN: 7.6 g/dL — AB (ref 12.0–15.0)
LYMPHS PCT: 29 % (ref 12–46)
Lymphs Abs: 2.6 10*3/uL (ref 0.7–4.0)
MCH: 30.9 pg (ref 26.0–34.0)
MCHC: 32.2 g/dL (ref 30.0–36.0)
MCV: 95.9 fL (ref 78.0–100.0)
MONOS PCT: 31 % — AB (ref 3–12)
Monocytes Absolute: 2.7 10*3/uL — ABNORMAL HIGH (ref 0.1–1.0)
Neutro Abs: 3.4 10*3/uL (ref 1.7–7.7)
Neutrophils Relative %: 39 % — ABNORMAL LOW (ref 43–77)
Platelets: 19 10*3/uL — CL (ref 150–400)
RBC: 2.46 MIL/uL — ABNORMAL LOW (ref 3.87–5.11)
RDW: 21.6 % — AB (ref 11.5–15.5)
WBC: 8.8 10*3/uL (ref 4.0–10.5)

## 2015-02-18 LAB — PREPARE RBC (CROSSMATCH)

## 2015-02-18 LAB — URINE MICROSCOPIC-ADD ON

## 2015-02-18 LAB — URINALYSIS, ROUTINE W REFLEX MICROSCOPIC
Glucose, UA: NEGATIVE mg/dL
KETONES UR: 15 mg/dL — AB
Leukocytes, UA: NEGATIVE
Nitrite: NEGATIVE
Protein, ur: NEGATIVE mg/dL
Specific Gravity, Urine: 1.018 (ref 1.005–1.030)
UROBILINOGEN UA: 2 mg/dL — AB (ref 0.0–1.0)
pH: 5 (ref 5.0–8.0)

## 2015-02-18 LAB — I-STAT CHEM 8, ED
BUN: 17 mg/dL (ref 6–20)
CALCIUM ION: 1.1 mmol/L — AB (ref 1.13–1.30)
CHLORIDE: 101 mmol/L (ref 101–111)
Creatinine, Ser: 0.8 mg/dL (ref 0.44–1.00)
Glucose, Bld: 111 mg/dL — ABNORMAL HIGH (ref 65–99)
HEMATOCRIT: 28 % — AB (ref 36.0–46.0)
Hemoglobin: 9.5 g/dL — ABNORMAL LOW (ref 12.0–15.0)
Potassium: 3.7 mmol/L (ref 3.5–5.1)
SODIUM: 137 mmol/L (ref 135–145)
TCO2: 23 mmol/L (ref 0–100)

## 2015-02-18 LAB — PROCALCITONIN: Procalcitonin: 0.16 ng/mL

## 2015-02-18 LAB — APTT: aPTT: 38 seconds — ABNORMAL HIGH (ref 24–37)

## 2015-02-18 LAB — LACTIC ACID, PLASMA: Lactic Acid, Venous: 0.7 mmol/L (ref 0.5–2.0)

## 2015-02-18 LAB — PROTIME-INR
INR: 1.23 (ref 0.00–1.49)
Prothrombin Time: 15.6 seconds — ABNORMAL HIGH (ref 11.6–15.2)

## 2015-02-18 LAB — CBG MONITORING, ED: Glucose-Capillary: 96 mg/dL (ref 65–99)

## 2015-02-18 LAB — TROPONIN I: Troponin I: 0.03 ng/mL (ref <0.031)

## 2015-02-18 LAB — CORTISOL: CORTISOL PLASMA: 13.3 ug/dL

## 2015-02-18 LAB — STREP PNEUMONIAE URINARY ANTIGEN: Strep Pneumo Urinary Antigen: NEGATIVE

## 2015-02-18 LAB — CK: CK TOTAL: 19 U/L — AB (ref 38–234)

## 2015-02-18 MED ORDER — PREDNISONE 50 MG PO TABS
50.0000 mg | ORAL_TABLET | Freq: Every day | ORAL | Status: DC
  Administered 2015-02-18 – 2015-02-19 (×2): 50 mg via ORAL
  Filled 2015-02-18 (×2): qty 1

## 2015-02-18 MED ORDER — LORAZEPAM 0.5 MG PO TABS
0.5000 mg | ORAL_TABLET | Freq: Three times a day (TID) | ORAL | Status: DC | PRN
  Administered 2015-02-19 – 2015-02-23 (×4): 0.5 mg via ORAL
  Filled 2015-02-18 (×4): qty 1

## 2015-02-18 MED ORDER — ACETAMINOPHEN 650 MG RE SUPP: 650.0000 mg | Freq: Four times a day (QID) | RECTAL | Status: DC | PRN

## 2015-02-18 MED ORDER — SODIUM CHLORIDE 0.9 % IV SOLN
INTRAVENOUS | Status: DC
  Administered 2015-02-19 – 2015-02-20 (×3): via INTRAVENOUS

## 2015-02-18 MED ORDER — PREDNISONE 2.5 MG PO TABS
2.5000 mg | ORAL_TABLET | Freq: Every day | ORAL | Status: DC
  Filled 2015-02-18: qty 1

## 2015-02-18 MED ORDER — SODIUM CHLORIDE 0.9 % IV SOLN
INTRAVENOUS | Status: DC
  Administered 2015-02-18: 02:00:00 via INTRAVENOUS

## 2015-02-18 MED ORDER — ONDANSETRON HCL 4 MG/2ML IJ SOLN: 4.0000 mg | Freq: Four times a day (QID) | INTRAMUSCULAR | Status: DC | PRN

## 2015-02-18 MED ORDER — SODIUM CHLORIDE 0.9 % IJ SOLN
3.0000 mL | Freq: Two times a day (BID) | INTRAMUSCULAR | Status: DC
  Administered 2015-02-20 – 2015-02-23 (×6): 3 mL via INTRAVENOUS
  Administered 2015-02-23: 10 mL via INTRAVENOUS

## 2015-02-18 MED ORDER — BUTALBITAL-APAP-CAFFEINE 50-325-40 MG PO TABS
2.0000 | ORAL_TABLET | ORAL | Status: DC | PRN
  Administered 2015-02-19: 2 via ORAL
  Filled 2015-02-18 (×2): qty 2

## 2015-02-18 MED ORDER — SODIUM CHLORIDE 0.9 % IV SOLN
Freq: Once | INTRAVENOUS | Status: AC
  Administered 2015-02-18: 14:00:00 via INTRAVENOUS

## 2015-02-18 MED ORDER — ACETAMINOPHEN 500 MG PO TABS
1000.0000 mg | ORAL_TABLET | Freq: Once | ORAL | Status: AC
  Administered 2015-02-18: 1000 mg via ORAL
  Filled 2015-02-18: qty 2

## 2015-02-18 MED ORDER — ACETAMINOPHEN 500 MG PO TABS: 1000.0000 mg | ORAL_TABLET | Freq: Once | ORAL | Status: DC

## 2015-02-18 MED ORDER — FLUOXETINE HCL 20 MG PO CAPS
30.0000 mg | ORAL_CAPSULE | Freq: Every morning | ORAL | Status: DC
  Administered 2015-02-18 – 2015-02-24 (×7): 30 mg via ORAL
  Filled 2015-02-18 (×11): qty 1

## 2015-02-18 MED ORDER — CHLORHEXIDINE GLUCONATE 0.12 % MT SOLN
15.0000 mL | Freq: Two times a day (BID) | OROMUCOSAL | Status: DC
  Administered 2015-02-18 – 2015-02-24 (×11): 15 mL via OROMUCOSAL
  Filled 2015-02-18 (×14): qty 15

## 2015-02-18 MED ORDER — IPRATROPIUM-ALBUTEROL 0.5-2.5 (3) MG/3ML IN SOLN
3.0000 mL | Freq: Two times a day (BID) | RESPIRATORY_TRACT | Status: DC
  Administered 2015-02-18 – 2015-02-23 (×11): 3 mL via RESPIRATORY_TRACT
  Filled 2015-02-18 (×14): qty 3

## 2015-02-18 MED ORDER — POTASSIUM CHLORIDE CRYS ER 20 MEQ PO TBCR
40.0000 meq | EXTENDED_RELEASE_TABLET | Freq: Once | ORAL | Status: AC
  Administered 2015-02-18: 40 meq via ORAL
  Filled 2015-02-18: qty 2

## 2015-02-18 MED ORDER — ONDANSETRON HCL 4 MG PO TABS: 4.0000 mg | ORAL_TABLET | Freq: Four times a day (QID) | ORAL | Status: DC | PRN

## 2015-02-18 MED ORDER — CLINDAMYCIN PHOSPHATE 600 MG/50ML IV SOLN
600.0000 mg | Freq: Three times a day (TID) | INTRAVENOUS | Status: DC
  Administered 2015-02-18 – 2015-02-19 (×3): 600 mg via INTRAVENOUS
  Filled 2015-02-18 (×5): qty 50

## 2015-02-18 MED ORDER — IPRATROPIUM-ALBUTEROL 0.5-2.5 (3) MG/3ML IN SOLN
3.0000 mL | RESPIRATORY_TRACT | Status: DC
  Administered 2015-02-18: 3 mL via RESPIRATORY_TRACT
  Filled 2015-02-18: qty 3

## 2015-02-18 MED ORDER — ACETAMINOPHEN 325 MG PO TABS
650.0000 mg | ORAL_TABLET | Freq: Four times a day (QID) | ORAL | Status: DC | PRN
  Administered 2015-02-24: 650 mg via ORAL
  Filled 2015-02-18: qty 2

## 2015-02-18 MED ORDER — ACETAMINOPHEN-CODEINE #3 300-30 MG PO TABS
1.0000 | ORAL_TABLET | ORAL | Status: DC | PRN
  Administered 2015-02-18 – 2015-02-23 (×12): 2 via ORAL
  Administered 2015-02-24 (×2): 1 via ORAL
  Filled 2015-02-18: qty 2
  Filled 2015-02-18: qty 1
  Filled 2015-02-18: qty 2
  Filled 2015-02-18: qty 1
  Filled 2015-02-18 (×2): qty 2
  Filled 2015-02-18: qty 1
  Filled 2015-02-18: qty 2
  Filled 2015-02-18: qty 1
  Filled 2015-02-18 (×5): qty 2
  Filled 2015-02-18: qty 1
  Filled 2015-02-18: qty 2

## 2015-02-18 MED ORDER — SODIUM CHLORIDE 0.9 % IV SOLN: INTRAVENOUS | Status: DC

## 2015-02-18 MED ORDER — IBUPROFEN 200 MG PO TABS
600.0000 mg | ORAL_TABLET | Freq: Once | ORAL | Status: AC
  Administered 2015-02-18: 600 mg via ORAL
  Filled 2015-02-18: qty 3

## 2015-02-18 NOTE — ED Notes (Signed)
Provider at bedside

## 2015-02-18 NOTE — Clinical Social Work Note (Signed)
Clinical Social Work Assessment  Patient Details  Name: Tonya Mckee MRN: [id number] Date of Birth: 06-Sep-1928  Date of referral:  02/18/15               Reason for consult:  Facility Placement                Permission sought to share information with:  Chartered certified accountant granted to share information::  Yes, Verbal Permission Granted  Name::        Agency::     Relationship::     Contact Information:     Housing/Transportation Living arrangements for the past 2 months:  Single Family Home Source of Information:  Patient, Adult Children Patient Interpreter Needed:  None Criminal Activity/Legal Involvement Pertinent to Current Situation/Hospitalization:  No - Comment as needed Significant Relationships:  Adult Children Lives with:  Self Do you feel safe going back to the place where you live?  No Need for family participation in patient care:  Yes (Comment)  Care giving concerns:  CSW received consult from [facility], Juliann Pulse that patient's daughter was asking about Assisted Living Facilities.    Social Worker assessment / plan:  CSW met with patient & son, Shanon Brow at bedside to discuss discharge planning, awaiting PT evaluation - will likely need SNF at discharge, though per patient, she had a horrible experience at [facility] when she went there in February of this year.   Employment status:  Retired Nurse, adult PT Recommendations:  Not assessed at this time [place] / Referral to community resources:  [place]  Patient/Family's Response to care:  Patient was resistant at first with the idea of going back to SNF at discharge, but after talking with her about what brought her into the hospital - she states that she was "laid up on the couch for a whole day and was too confused and weak to even hit the Life Alert button on her bracelet."  Patient/Family's Understanding of and Emotional Response to Diagnosis,  Current Treatment, and Prognosis:  Patient informed CSW that she still feels pretty weak and will work with PT tomorrow.   Emotional Assessment Appearance:  Appears stated age Attitude/Demeanor/Rapport:    Affect (typically observed):  Pleasant, Accepting, Happy Orientation:  Oriented to Self, Oriented to Place, Oriented to  Time, Oriented to Situation Alcohol / Substance use:    Psych involvement (Current and /or in the community):     Discharge Needs  Concerns to be addressed:    Readmission within the last 30 days:    Current discharge risk:    Barriers to Discharge:      Standley Brooking, LCSW 02/18/2015, 3:14 PM

## 2015-02-18 NOTE — Clinical Social Work Placement (Signed)
   CLINICAL SOCIAL WORK PLACEMENT  NOTE  Date:  02/18/2015  Patient Details  Name: Tonya Mckee MRN: [id number] Date of Birth: 13-Jan-1929  Clinical Social Work is seeking post-discharge placement for this patient at the [place] level of care (*CSW will initial, date and re-position this form in  chart as items are completed):  Yes   Patient/family provided with [place] Work Department's list of facilities offering this level of care within the geographic area requested by the patient (or if unable, by the patient's family).  Yes   Patient/family informed of their freedom to choose among providers that offer the needed level of care, that participate in Medicare, Medicaid or managed care program needed by the patient, have an available bed and are willing to accept the patient.  Yes   Patient/family informed of 's ownership interest in [facility] and [facility], as well as of the fact that they are under no obligation to receive care at these facilities.  PASRR submitted to EDS on 02/18/15     PASRR number received on 02/18/15     Existing PASRR number confirmed on       FL2 transmitted to all facilities in geographic area requested by pt/family on 02/18/15     FL2 transmitted to all facilities within larger geographic area on       Patient informed that his/her managed care company has contracts with or will negotiate with certain facilities, including the following:        Yes   Patient/family informed of bed offers received.  Patient chooses bed at       Physician recommends and patient chooses bed at      Patient to be transferred to   on  .  Patient to be transferred to facility by       Patient family notified on   of transfer.  Name of family member notified:        PHYSICIAN       Additional Comment:    _______________________________________________ Standley Brooking, LCSW 02/18/2015, 3:23 PM

## 2015-02-18 NOTE — Progress Notes (Signed)
Patient seen and admitted earlier this AM by my associate.  Per oncology recommendations will transfuse 2 units of PRBC's  Gen: pt in nad, alert and awake CV: no cyanosis Pulm: equal chest rise, breathing comfortably with supplemental oxygen in place via [place]  Will reassess next am. Unless there is an acute medical condition requiring reassessment.  Nolia Tschantz, [employer]

## 2015-02-18 NOTE — Telephone Encounter (Signed)
VM message from [facility] regarding refill prescription for Revlimid. TC back to [facility] @ [facility]. Revlimid is currently on hold. Pt taking prednisone for now and also pt has been admitted to hospital with COPD exacerbation, pancytopenia. Helene Kelp states that pt will need new prescription if she is to resume her Revlimid.

## 2015-02-18 NOTE — Care Management Note (Signed)
Case Management Note  Patient Details  Name: HARUKA KOWALESKI MRN: [id number] Date of Birth: 1929-05-08  Subjective/Objective:Spoke to dtr in rm Page I#[phone], asking about ALF, says her mother has gone to [facility] but she doesn't think her mom will agree to go to SNF.CSW notified.Explained that once PT has provided recommendations, we will discuss options.Dtr voiced understanding.                    Action/Plan:   Expected Discharge Date:  02/19/15               Expected Discharge Plan:  [place]  In-House Referral:     Discharge planning Services  CM Consult  Post Acute Care Choice:    Choice offered to:     DME Arranged:    DME Agency:     HH Arranged:    HH Agency:     Status of Service:  In process, will continue to follow  Medicare Important Message Given:    Date Medicare IM Given:    Medicare IM give by:    Date Additional Medicare IM Given:    Additional Medicare Important Message give by:     If discussed at [place] of Stay Meetings, dates discussed:    Additional Comments:  Dessa Phi, RN 02/18/2015, 12:29 PM

## 2015-02-18 NOTE — ED Notes (Signed)
Patient transported to X-ray 

## 2015-02-18 NOTE — ED Notes (Signed)
Provide water for fluids.

## 2015-02-18 NOTE — ED Provider Notes (Addendum)
TIME SEEN: 1:40 AM  CHIEF COMPLAINT: Generalized weakness  HPI: Pt is a 81 y.o. female with history of hypertension, mild dementia, COPD who comes from home with complaints of generalized weakness that started today. Patient reports she has had a hard time getting off the couch. She is normally able to ambulate with a walker. She states that she does have a headache in her bilateral temples. States she does get headaches regularly that improve with Tylenol and ibuprofen. No recent head injury. Denies numbness, tingling or focal weakness. States she has had a dry cough. No vomiting or diarrhea. Denies dysuria or hematuria. Denies any recent fever. Denies chest pain or shortness of breath.  ROS: See HPI Constitutional: no fever  Eyes: no drainage  ENT: no runny nose   Cardiovascular:  no chest pain  Resp: no SOB  GI: no vomiting GU: no dysuria Integumentary: no rash  Allergy: no hives  Musculoskeletal: no leg swelling  Neurological: no slurred speech ROS otherwise negative  PAST MEDICAL HISTORY/PAST SURGICAL HISTORY:  Past Medical History  Diagnosis Date  . COPD (chronic obstructive pulmonary disease)   . Hypertension   . PONV (postoperative nausea and vomiting)   . Dysrhythmia     rapid heart rate  . Stroke 2010    mini stroke  . Pneumonia 2009  . Shortness of breath   . GERD (gastroesophageal reflux disease)     long time ago  . Depression   . DEMENTIA     borderline  . Depression 01/02/2012  . Anemia 01/02/2012  . Artery stenosis 10/30/12    Moderate to severe left extracranial vertebral  . Cancer 20years ago+ 1999    lt breast  left  . SCC (squamous cell carcinoma), leg     Right  LE Dr. [place] Crane  . Arthritis     Osteo-  Spine and Knees  . Vitamin B12 deficiency   . Vertigo, benign positional   . Memory loss   . Blepharospasm     Left eye  . Venous stasis ulcers 2012    Left LE due to trauma  . Cellulitis Jan 01, 2012    Left UE  . Carotid artery occlusion    Bruit- RIGHT  . Occlusion and stenosis of carotid artery without mention of cerebral infarction 11/06/2012  . Persistent headaches 02/26/2014  . Chronic respiratory failure     on NCO2     MEDICATIONS:  Prior to Admission medications   Medication Sig Start Date End Date Taking? Authorizing Provider  acetaminophen (TYLENOL) 325 MG tablet Take 650 mg by mouth every 6 (six) hours as needed for moderate pain (pain).    Yes Historical Provider, MD  acetaminophen-codeine (TYLENOL #3) 300-30 MG per tablet Take 1-2 tablets by mouth every 4 (four) hours as needed. pain 12/06/14  Yes Historical Provider, MD  albuterol (PROVENTIL HFA;VENTOLIN HFA) 108 (90 BASE) MCG/ACT inhaler Inhale 2 puffs into the lungs every 6 (six) hours as needed for wheezing or shortness of breath (wheezing).    Yes Historical Provider, MD  calcium carbonate (TUMS - DOSED IN MG ELEMENTAL CALCIUM) 500 MG chewable tablet Chew 1 tablet by mouth daily as needed for indigestion or heartburn.   Yes Historical Provider, MD  chlorhexidine (PERIDEX) 0.12 % solution 15 mLs by Mouth Rinse route 2 (two) times daily. 09/17/14  Yes Robbie Lis, MD  Cholecalciferol (VITAMIN D) 2000 UNITS tablet Take 2,000 Units by mouth daily.   Yes Historical Provider, MD  FLUoxetine (PROZAC)  10 MG capsule Take 3 capsules (30 mg total) by mouth every morning. 09/17/14  Yes Robbie Lis, MD  ipratropium-albuterol (DUONEB) 0.5-2.5 (3) MG/3ML SOLN Take 3 mLs by nebulization 2 (two) times daily as needed (wheezing). 09/17/14  Yes Robbie Lis, MD  LORazepam (ATIVAN) 0.5 MG tablet Take 1 tablet (0.5 mg total) by mouth every 8 (eight) hours as needed for anxiety (anxiety). 09/17/14  Yes Robbie Lis, MD  metoprolol succinate (TOPROL-XL) 50 MG 24 hr tablet Take 50 mg by mouth every evening. Take with or immediately following a meal.   Yes Historical Provider, MD  tiotropium (SPIRIVA) 18 MCG inhalation capsule Place 18 mcg into inhaler and inhale daily.   Yes Historical  Provider, MD  valsartan (DIOVAN) 40 MG tablet Take 1 tablet by mouth daily. 01/31/15  Yes Historical Provider, MD  famotidine (PEPCID AC) 10 MG tablet Take 1 tablet (10 mg total) by mouth 2 (two) times daily. Patient not taking: Reported on 02/18/2015 09/17/14   Robbie Lis, MD  lenalidomide (REVLIMID) 2.5 MG capsule Take 1 capsule (2.5 mg total) by mouth daily. Patient not taking: Reported on 02/18/2015 02/09/15   Heath Lark, MD  menthol-cetylpyridinium (CEPACOL) 3 MG lozenge Take 1 lozenge (3 mg total) by mouth as needed for sore throat. Patient not taking: Reported on 02/18/2015 08/01/14   Nishant Dhungel, MD  ondansetron (ZOFRAN) 4 MG tablet Take 1 tablet (4 mg total) by mouth every 6 (six) hours as needed for nausea. Patient not taking: Reported on 02/18/2015 09/17/14   Robbie Lis, MD  polyethylene glycol [facility] / Floria Raveling) packet Take 17 g by mouth daily. Patient not taking: Reported on 02/18/2015 09/17/14   Robbie Lis, MD  predniSONE (DELTASONE) 2.5 MG tablet Take 1 tablet (2.5 mg total) by mouth daily with breakfast. Patient not taking: Reported on 02/18/2015 12/12/14   Heath Lark, MD  valsartan (DIOVAN) 160 MG tablet Take 1 tablet (160 mg total) by mouth daily. Patient not taking: Reported on 02/18/2015 10/06/14   Tanda Rockers, MD    ALLERGIES:  Allergies  Allergen Reactions  . Donepezil Nausea And Vomiting  . Penicillins Itching and Nausea And Vomiting  . Amoxapine And Related Nausea Only  . Doxycycline Rash  . Keflex [Cephalexin] Itching  . Bupropion Other (See Comments)    "Headaches on the XL."   . Ketek [Telithromycin] Itching    Dry Mouth  . Levaquin [Levofloxacin In D5w] Other (See Comments)    Insomnia  . Sulfa Antibiotics Nausea And Vomiting  . Tramadol Other (See Comments)    "Made her feel like a zombie."  . Zyrtec [Cetirizine] Other (See Comments)    "made her like a zombie"    SOCIAL HISTORY:  History  Substance Use Topics  . Smoking status: Former Smoker -- 0.50  packs/day for 68 years    Types: Cigarettes    Quit date: 09/15/2014  . Smokeless tobacco: Never Used  . Alcohol Use: No    FAMILY HISTORY: Family History  Problem Relation Age of Onset  . Adopted: Yes    EXAM: BP 161/59 mmHg  Pulse 78  Temp(Src) 99.3 F (37.4 C) (Oral)  Resp 34  SpO2 94% CONSTITUTIONAL: Alert and oriented and responds appropriately to questions. Elderly, in no distress, pleasant, nontoxic HEAD: Normocephalic EYES: Conjunctivae clear, PERRL ENT: normal nose; no rhinorrhea; moist mucous membranes; pharynx without lesions noted NECK: Supple, no meningismus, no LAD  CARD: RRR; S1 and S2 appreciated; no murmurs,  no clicks, no rubs, no gallops RESP: Normal chest excursion without splinting or tachypnea; breath sounds clear and equal bilaterally; no wheezes, no rhonchi, no rales, no hypoxia or respiratory distress, speaking full sentences ABD/GI: Normal bowel sounds; non-distended; soft, non-tender, no rebound, no guarding, no peritoneal signs BACK:  The back appears normal and is non-tender to palpation, there is no CVA tenderness EXT: Normal ROM in all joints; non-tender to palpation; no edema; normal capillary refill; no cyanosis, no calf tenderness or swelling    SKIN: Normal color for age and race; warm NEURO: Moves all extremities equally, sensation to light touch intact diffusely, cranial nerves II through XII intact, able to ambulate with walker without assistance PSYCH: The patient's mood and manner are appropriate. Grooming and personal hygiene are appropriate.  MEDICAL DECISION MAKING: Patient here with generalized weakness. She was found to have a rectal temperature of 101.2. Her labs have been unremarkable other than hemoglobin of 8.9 which is improved compared to prior. No leukocytosis. EKG shows no ischemic changes and troponin is negative. Her urine shows trace hemoglobin but this is a catheterized specimen and there is no other sign of infection. Culture  is pending given her fever. We'll also obtain blood cultures and lactate which are pending as well as a chest x-ray. She is drinking fluids without difficulty and receiving IV fluids in the emergency department.  ED PROGRESS: Patient's chest x-ray shows no infiltrate. Lactate is 0.69. She has been able to ambulate in the emergency department using a walker but does have significant amount of difficulty sitting upright in bed and getting out of bed which is abnormal for her. She has fever of unknown origin. Doubt meningitis given headache his RD improved and she's had similar headaches in the past. She has no meningismus on exam. She states that she lives at home alone and her daughter lives in [place]. She is very worried about going home and being able to care for herself. Have discussed with her that I will discuss this with hospitalist on-call for possible obs admission.  4:00 AM  D/w Dr. Posey Pronto for admission to observation for generalized weakness and fever of unknown origin.    EKG Interpretation  Date/Time:  Tuesday February 17 2015 23:33:02 EDT Ventricular Rate:  77 PR Interval:  128 QRS Duration: 75 QT Interval:  402 QTC Calculation: 455 R Axis:   67 Text Interpretation:  Sinus rhythm No significant change since last tracing Confirmed by Luana Tatro,  DO, Nargis Abrams [phone]) on 02/18/2015 1:43:14 AM        Delice Bison Brittish Bolinger, DO 02/18/15 [place], DO 02/18/15 0410

## 2015-02-18 NOTE — Care Management Note (Signed)
Case Management Note  Patient Details  Name: Tonya Mckee MRN: [id number] Date of Birth: 1929/06/28  Subjective/Objective:  81 y/o f admitted w/COPD. RZ:NBVA. From home. Has home 02,rw.PT cons. Await recommendations.                 Action/Plan:Monitor progress for d/c needs.   Expected Discharge Date:  02/19/15               Expected Discharge Plan:  [place]  In-House Referral:     Discharge planning Services  CM Consult  Post Acute Care Choice:    Choice offered to:     DME Arranged:    DME Agency:     HH Arranged:    HH Agency:     Status of Service:  In process, will continue to follow  Medicare Important Message Given:    Date Medicare IM Given:    Medicare IM give by:    Date Additional Medicare IM Given:    Additional Medicare Important Message give by:     If discussed at [place] of Stay Meetings, dates discussed:    Additional Comments:  Dessa Phi, RN 02/18/2015, 10:58 AM

## 2015-02-18 NOTE — Progress Notes (Signed)
PT Cancellation Note  Patient Details Name: Tonya Mckee MRN: [id number] DOB: 19-Aug-1928   Cancelled Treatment:    Reason Eval/Treat Not Completed: Medical issues which prohibited therapy (to receive 2 units PRBCs)   Tonya Mckee,Tonya Mckee 02/18/2015, 12:21 PM Carmelia Bake, PT, DPT 02/18/2015 Pager: [phone]

## 2015-02-18 NOTE — ED Notes (Signed)
Patient states she has a frontal headache that she woke up with yesterday afternoon. Denies nausea, vomiting, blurry vision, or dizziness. Pt reports being generalized weak.

## 2015-02-18 NOTE — ED Notes (Signed)
Pt ambulated with walker to the doorway and back to stretcher. Pt needs assistance with getting in and out of bed.

## 2015-02-18 NOTE — H&P (Signed)
[facility] History and Physical  Patient: Tonya Mckee  MRN: [id number]  DOB: [birth year]  DOS: the patient was seen and examined on 02/18/2015 PCP: [name]  Referring physician: Dr. Leonides Schanz Chief Complaint: Fatigue  HPI: Tonya Mckee is a 81 y.o. female with Past medical history of COPD, myelodysplastic syndrome with 5 Qdeletion with pancytopenia, essential hypertension, chronic respiratory failure, chronic headache. The patient is presenting with complaints of generalized fatigue. She mentions that she has been having progressively worsening fatigue since last 3-4 days, she also has increased shortness of breath. She denies any cough denies any fever denies any chills. Denies any nausea or vomiting. Denies any new vision changes denies any speech difficulty denies any swallowing difficulty. She denies any diarrhea constipation. She denies any vomiting or nausea. She denies any chest pain or abdominal pain. No flank pain. She denies any focal deficit in her legs. Her weakness has been progressively worsening and today she was so weak that she could not stand out from her sofa on her own and laid there throughout the day and had urinated on herself as well. She recently had dental extraction and was on antibiotics which she completed a week ago. She thinks it was a Z-Pak. Her revilimid  has been on hold and she is taking prednisone on a regular basis as well as using nasal cannula oxygen daily at bedtime.  The patient is coming from home.  At her baseline ambulates with walker And is independent for most of her ADL manages her medication on her own.  Review of Systems: as mentioned in the history of present illness.  A comprehensive review of the other systems is negative.  Past Medical History  Diagnosis Date  . COPD (chronic obstructive pulmonary disease)   . Hypertension   . PONV (postoperative nausea and vomiting)   . Dysrhythmia     rapid heart rate    . Stroke 2010    mini stroke  . Pneumonia 2009  . Shortness of breath   . GERD (gastroesophageal reflux disease)     long time ago  . Depression   . DEMENTIA     borderline  . Depression 01/02/2012  . Anemia 01/02/2012  . Artery stenosis 10/30/12    Moderate to severe left extracranial vertebral  . Cancer 20years ago+ 1999    lt breast  left  . SCC (squamous cell carcinoma), leg     Right  LE Dr. [place] Crane  . Arthritis     Osteo-  Spine and Knees  . Vitamin B12 deficiency   . Vertigo, benign positional   . Memory loss   . Blepharospasm     Left eye  . Venous stasis ulcers 2012    Left LE due to trauma  . Cellulitis Jan 01, 2012    Left UE  . Carotid artery occlusion     Bruit- RIGHT  . Occlusion and stenosis of carotid artery without mention of cerebral infarction 11/06/2012  . Persistent headaches 02/26/2014  . Chronic respiratory failure     on NCO2    Past Surgical History  Procedure Laterality Date  . Abdominal hysterectomy    . Appendectomy    . Breast surgery    . Joint replacement      Lt arm/wrist plates and screws  . Eye surgery      L/R catarects   Social History:  reports that she quit smoking about 5 months ago. Her smoking use included Cigarettes. She has  a 34 pack-year smoking history. She has never used smokeless tobacco. She reports that she does not drink alcohol or use illicit drugs.  Allergies  Allergen Reactions  . Donepezil Nausea And Vomiting  . Penicillins Itching and Nausea And Vomiting  . Amoxapine And Related Nausea Only  . Doxycycline Rash  . Keflex [Cephalexin] Itching  . Bupropion Other (See Comments)    "Headaches on the XL."   . Ketek [Telithromycin] Itching    Dry Mouth  . Levaquin [Levofloxacin In D5w] Other (See Comments)    Insomnia  . Sulfa Antibiotics Nausea And Vomiting  . Tramadol Other (See Comments)    "Made her feel like a zombie."  . Zyrtec [Cetirizine] Other (See Comments)    "made her like a zombie"    Family  History  Problem Relation Age of Onset  . Adopted: Yes    Prior to Admission medications   Medication Sig Start Date End Date Taking? Authorizing Provider  acetaminophen (TYLENOL) 325 MG tablet Take 650 mg by mouth every 6 (six) hours as needed for moderate pain (pain).    Yes Historical Provider, MD  acetaminophen-codeine (TYLENOL #3) 300-30 MG per tablet Take 1-2 tablets by mouth every 4 (four) hours as needed. pain 12/06/14  Yes Historical Provider, MD  albuterol (PROVENTIL HFA;VENTOLIN HFA) 108 (90 BASE) MCG/ACT inhaler Inhale 2 puffs into the lungs every 6 (six) hours as needed for wheezing or shortness of breath (wheezing).    Yes Historical Provider, MD  calcium carbonate (TUMS - DOSED IN MG ELEMENTAL CALCIUM) 500 MG chewable tablet Chew 1 tablet by mouth daily as needed for indigestion or heartburn.   Yes Historical Provider, MD  chlorhexidine (PERIDEX) 0.12 % solution 15 mLs by Mouth Rinse route 2 (two) times daily. 09/17/14  Yes Robbie Lis, MD  Cholecalciferol (VITAMIN D) 2000 UNITS tablet Take 2,000 Units by mouth daily.   Yes Historical Provider, MD  FLUoxetine (PROZAC) 10 MG capsule Take 3 capsules (30 mg total) by mouth every morning. 09/17/14  Yes Robbie Lis, MD  ipratropium-albuterol (DUONEB) 0.5-2.5 (3) MG/3ML SOLN Take 3 mLs by nebulization 2 (two) times daily as needed (wheezing). 09/17/14  Yes Robbie Lis, MD  LORazepam (ATIVAN) 0.5 MG tablet Take 1 tablet (0.5 mg total) by mouth every 8 (eight) hours as needed for anxiety (anxiety). 09/17/14  Yes Robbie Lis, MD  metoprolol succinate (TOPROL-XL) 50 MG 24 hr tablet Take 50 mg by mouth every evening. Take with or immediately following a meal.   Yes Historical Provider, MD  predniSONE (DELTASONE) 2.5 MG tablet Take 1 tablet (2.5 mg total) by mouth daily with breakfast. 12/12/14  Yes Heath Lark, MD  tiotropium (SPIRIVA) 18 MCG inhalation capsule Place 18 mcg into inhaler and inhale daily.   Yes Historical Provider, MD  valsartan  (DIOVAN) 40 MG tablet Take 1 tablet by mouth daily. 01/31/15  Yes Historical Provider, MD  famotidine (PEPCID AC) 10 MG tablet Take 1 tablet (10 mg total) by mouth 2 (two) times daily. Patient not taking: Reported on 02/18/2015 09/17/14   Robbie Lis, MD  lenalidomide (REVLIMID) 2.5 MG capsule Take 1 capsule (2.5 mg total) by mouth daily. Patient not taking: Reported on 02/18/2015 02/09/15   Heath Lark, MD  menthol-cetylpyridinium (CEPACOL) 3 MG lozenge Take 1 lozenge (3 mg total) by mouth as needed for sore throat. Patient not taking: Reported on 02/18/2015 08/01/14   Nishant Dhungel, MD  ondansetron (ZOFRAN) 4 MG tablet Take 1 tablet (  4 mg total) by mouth every 6 (six) hours as needed for nausea. Patient not taking: Reported on 02/18/2015 09/17/14   Robbie Lis, MD  polyethylene glycol [facility] / Floria Raveling) packet Take 17 g by mouth daily. Patient not taking: Reported on 02/18/2015 09/17/14   Robbie Lis, MD  valsartan (DIOVAN) 160 MG tablet Take 1 tablet (160 mg total) by mouth daily. Patient not taking: Reported on 02/18/2015 10/06/14   Tanda Rockers, MD    Physical Exam: Filed Vitals:   02/18/15 0201 02/18/15 0300 02/18/15 0331 02/18/15 0547  BP: 167/64  140/65 148/61  Pulse: 79  72 71  Temp:  101 F (38.3 C)  98.1 F (36.7 C)  TempSrc:    Oral  Resp: 22  20 22   Height:    5\' 2"  (1.575 m)  Weight:    50.349 kg (111 lb)  SpO2: 97%  98% 99%    General: Alert, Awake and Oriented to Time, Place and Person. Appear in mild distress Eyes: PERRL ENT: Oral Mucosa clear moist. Neck: no JVD Cardiovascular: S1 and S2 Present, aortic systolic  Murmur, Peripheral Pulses Present Respiratory: Bilateral Air entry equal and Decreased,  Faint basal Crackles, occasional  wheezes Abdomen: Bowel Sound present, Soft and non tender Skin: no Rash Extremities: Trace Pedal edema, no calf tenderness Neurologic: Grossly no focal neuro deficit.  Labs on Admission:  CBC:  Recent Labs Lab 02/12/15 1141  02/18/15 0050 02/18/15 0058  WBC 5.4 9.5  --   HGB 6.8* 8.9* 9.5*  HCT 21.1* 27.0* 28.0*  MCV 99.6 96.4  --   PLT 47* 27*  --     CMP     Component Value Date/Time   NA 137 02/18/2015 0058   NA 142 02/12/2015 1141   K 3.7 02/18/2015 0058   K 4.9 02/12/2015 1141   CL 101 02/18/2015 0058   CO2 24 02/18/2015 0050   CO2 25 02/12/2015 1141   GLUCOSE 111* 02/18/2015 0058   GLUCOSE 102 02/12/2015 1141   BUN 17 02/18/2015 0058   BUN 18.6 02/12/2015 1141   CREATININE 0.80 02/18/2015 0058   CREATININE 0.9 02/12/2015 1141   CALCIUM 8.3* 02/18/2015 0050   CALCIUM 8.9 02/12/2015 1141   PROT 7.1 02/18/2015 0050   PROT 6.5 02/12/2015 1141   ALBUMIN 3.0* 02/18/2015 0050   ALBUMIN 3.2* 02/12/2015 1141   AST 16 02/18/2015 0050   AST 12 02/12/2015 1141   ALT 7* 02/18/2015 0050   ALT 6 02/12/2015 1141   ALKPHOS 49 02/18/2015 0050   ALKPHOS 61 02/12/2015 1141   BILITOT 1.7* 02/18/2015 0050   BILITOT 0.67 02/12/2015 1141   GFRNONAA >60 02/18/2015 0050   GFRAA >60 02/18/2015 0050    No results for input(s): LIPASE, AMYLASE in the last 168 hours.   Recent Labs Lab 02/18/15 0130 02/18/15 0145  CKTOTAL 19*  --   TROPONINI  --  0.03   BNP (last 3 results) No results for input(s): BNP in the last 8760 hours.  ProBNP (last 3 results) No results for input(s): PROBNP in the last 8760 hours.   Radiological Exams on Admission: Dg Chest 2 View  02/18/2015   CLINICAL DATA:  Fever  EXAM: CHEST  2 VIEW  COMPARISON:  09/15/2014  FINDINGS: Chronic cardiomegaly. Stable mild aortic tortuosity when accounting for rotation. Chronic interstitial coarsening and hyperinflation, bronchitic and emphysematous disease based on 12/19/2013 chest CT. No superimposed consolidation. Small bilateral pleural effusions are present. Status post left breast  and axillary surgery.  IMPRESSION: COPD and trace bilateral pleural effusion. No pneumonia to explain the history of fever.   Electronically Signed   By:  Monte Fantasia M.D.   On: 02/18/2015 03:23   EKG: Independently reviewed. normal sinus rhythm, nonspecific ST and T waves changes.  Assessment/Plan Principal Problem:   COPD exacerbation Active Problems:   Essential hypertension   COPD GOLD II   MDS (myelodysplastic syndrome) with 5q deletion   Dizziness and giddiness   Persistent headaches   Chronic respiratory failure   Thrombocytopenia due to drugs   Fever   General weakness   1. COPD exacerbation The patient is presenting with complaints of significant weakness and fatigue. Her chest x-ray is clear or but on examination she appears to be in significant respiratory distress and is breathing heavily. Her lung sounds shows basal crackles as well as occasional wheezing. With this I would presumably treat her with increasing the prednisone dose, duo nebs as well as antibiotics for possible COPD flareup. I will check cultures and urine antigens.  2. Generalized weakness. Neurological examination does not reveal any acute abnormality. Although the patient had significant weakness requiring her to stay in the bed throughout the day and cannot stand up on her own. I will get a CT scan of her head to rule out any acute abnormality.  3. MDS, pancytopenia. The patient has significant pancytopenia secondary to MDS. We will continue to closely monitor. As per documentation from her oncologist transfusion off hemoglobin is less than 8 and platelets as needed.  4. persistent headache. Etiology unclear. CT scan of the head. When necessary management.  5. fever. Etiology unclear. Possible mild bronchitis. Recent dental extraction possible dental abscess. Continue antibiotics.   Advance goals of care discussion: Full code   DVT Prophylaxis: mechanical compression device Nutrition: Regular diet   Disposition: Admitted as observation, telemetry unit.  Author: Berle Mull, MD [facility] Pager:  [phone] 02/18/2015  If 7PM-7AM, please contact night-coverage [email or web address] Password [email or web address]

## 2015-02-19 ENCOUNTER — Other Ambulatory Visit: Payer: Self-pay

## 2015-02-19 ENCOUNTER — Other Ambulatory Visit: Payer: Self-pay | Admitting: *Deleted

## 2015-02-19 ENCOUNTER — Telehealth: Payer: Self-pay | Admitting: *Deleted

## 2015-02-19 ENCOUNTER — Other Ambulatory Visit: Payer: Self-pay | Admitting: Radiology

## 2015-02-19 DIAGNOSIS — D46C Myelodysplastic syndrome with isolated del(5q) chromosomal abnormality: Secondary | ICD-10-CM

## 2015-02-19 DIAGNOSIS — D630 Anemia in neoplastic disease: Secondary | ICD-10-CM

## 2015-02-19 DIAGNOSIS — D6959 Other secondary thrombocytopenia: Secondary | ICD-10-CM

## 2015-02-19 DIAGNOSIS — R531 Weakness: Secondary | ICD-10-CM

## 2015-02-19 LAB — TROPONIN I: Troponin I: <0.03 ng/mL (ref <0.031)

## 2015-02-19 LAB — TYPE AND SCREEN
ABO/RH(D): O POS
Antibody Screen: NEGATIVE
Unit division: 0
Unit division: 0

## 2015-02-19 LAB — URINE CULTURE: CULTURE: NO GROWTH

## 2015-02-19 LAB — MRSA PCR SCREENING: MRSA by PCR: NEGATIVE

## 2015-02-19 LAB — CBC
HCT: 29.8 % — ABNORMAL LOW (ref 36.0–46.0)
Hemoglobin: 9.9 g/dL — ABNORMAL LOW (ref 12.0–15.0)
MCH: 30.6 pg (ref 26.0–34.0)
MCHC: 33.2 g/dL (ref 30.0–36.0)
MCV: 92 fL (ref 78.0–100.0)
PLATELETS: 29 10*3/uL — AB (ref 150–400)
RBC: 3.24 MIL/uL — ABNORMAL LOW (ref 3.87–5.11)
RDW: 20.8 % — AB (ref 11.5–15.5)
WBC: 13.4 10*3/uL — AB (ref 4.0–10.5)

## 2015-02-19 LAB — LEGIONELLA ANTIGEN, URINE

## 2015-02-19 LAB — POTASSIUM: Potassium: 4 mmol/L (ref 3.5–5.1)

## 2015-02-19 MED ORDER — METOPROLOL TARTRATE 1 MG/ML IV SOLN
INTRAVENOUS | Status: AC
  Administered 2015-02-19: 19:00:00
  Filled 2015-02-19: qty 5

## 2015-02-19 MED ORDER — DILTIAZEM LOAD VIA INFUSION
10.0000 mg | Freq: Once | INTRAVENOUS | Status: DC
  Administered 2015-02-19: 10 mg via INTRAVENOUS
  Filled 2015-02-19: qty 10

## 2015-02-19 MED ORDER — METOPROLOL TARTRATE 1 MG/ML IV SOLN
5.0000 mg | INTRAVENOUS | Status: DC | PRN
  Administered 2015-02-19 – 2015-02-22 (×2): 5 mg via INTRAVENOUS
  Filled 2015-02-19 (×2): qty 5

## 2015-02-19 MED ORDER — DIGOXIN 0.25 MG/ML IJ SOLN
0.1250 mg | Freq: Every day | INTRAMUSCULAR | Status: DC
  Administered 2015-02-21 – 2015-02-23 (×3): 0.125 mg via INTRAVENOUS
  Filled 2015-02-19 (×6): qty 0.5

## 2015-02-19 MED ORDER — HYDRALAZINE HCL 20 MG/ML IJ SOLN
10.0000 mg | Freq: Four times a day (QID) | INTRAMUSCULAR | Status: DC | PRN
  Administered 2015-02-19: 10 mg via INTRAVENOUS
  Filled 2015-02-19: qty 1

## 2015-02-19 MED ORDER — DIGOXIN 0.25 MG/ML IJ SOLN
0.2500 mg | Freq: Once | INTRAMUSCULAR | Status: AC
  Administered 2015-02-19: 0.25 mg via INTRAVENOUS
  Filled 2015-02-19: qty 1

## 2015-02-19 MED ORDER — METOPROLOL TARTRATE 1 MG/ML IV SOLN
5.0000 mg | Freq: Once | INTRAVENOUS | Status: AC
  Administered 2015-02-22: 5 mg via INTRAVENOUS
  Filled 2015-02-19: qty 5

## 2015-02-19 MED ORDER — DILTIAZEM HCL 100 MG IV SOLR
5.0000 mg/h | INTRAVENOUS | Status: DC
  Filled 2015-02-19: qty 100

## 2015-02-19 MED ORDER — METHYLPREDNISOLONE SODIUM SUCC 40 MG IJ SOLR
40.0000 mg | Freq: Two times a day (BID) | INTRAMUSCULAR | Status: DC
  Administered 2015-02-19 – 2015-02-21 (×5): 40 mg via INTRAVENOUS
  Filled 2015-02-19 (×7): qty 1

## 2015-02-19 MED ORDER — LEVOFLOXACIN IN D5W 500 MG/100ML IV SOLN
500.0000 mg | INTRAVENOUS | Status: DC
  Administered 2015-02-19 – 2015-02-20 (×2): 500 mg via INTRAVENOUS
  Filled 2015-02-19 (×2): qty 100

## 2015-02-19 MED ORDER — DIGOXIN 0.25 MG/ML IJ SOLN
0.2500 mg | Freq: Once | INTRAMUSCULAR | Status: AC
  Administered 2015-02-20: 0.25 mg via INTRAVENOUS
  Filled 2015-02-19: qty 1

## 2015-02-19 NOTE — Progress Notes (Signed)
PT Cancellation Note  Patient Details Name: Tonya Mckee MRN: [id number] DOB: 05-11-1929   Cancelled Treatment:    Reason Eval/Treat Not Completed: Attempted PT eval. PT/family report pt has been having some trouble breathing on today. Both requested PT check back another time. Will check back as schedule permits. Thanks.    Weston Anna, MPT Pager: [phone]

## 2015-02-19 NOTE — Progress Notes (Signed)
[facility] PROGRESS NOTE  Tonya Mckee LTJ:[id number] DOB: [birth year] DOA: 02/17/2015 PCP: [name]  Assessment/Plan:  Principal Problem:   COPD exacerbation - Given multiple allergies patient is currently on clindamycin (will favor shorter course in lieu of age) - will transition to solumedrol and d/c prednisone - supplemental oxygen, wean as tolerated. - continue duonebs   Active Problems:   Essential hypertension - blood pressures relatively well controlled on current regimen    MDS (myelodysplastic syndrome) with 5q deletion - transfusing if hgb less than 8.0. Repeat cbc pending.    Persistent headaches - May be secondary to patient not resting well. - no focal neurological symptoms reported and CT scan of head reported no acute intracranial abnormality    Thrombocytopenia due to drugs - will avoid heparin, scd's for dvt prophylaxis    General weakness - Consulted PT  Code Status: full Family Communication: discussed with son at bedside.  Disposition Plan: Pending improvement in condition   Consultants:  none  Procedures:  none  Antibiotics:  Clindamycin>>>levaquin on 7/7   HPI/Subjective: Pt complaining of headaches but denies any photophobia or neurological focal findings.  Objective: Filed Vitals:   02/19/15 0459  BP: 146/63  Pulse: 69  Temp: 98.5 F (36.9 C)  Resp: 20    Intake/Output Summary (Last 24 hours) at 02/19/15 1326 Last data filed at 02/19/15 0900  Gross per 24 hour  Intake 2482.5 ml  Output    425 ml  Net 2057.5 ml   Filed Weights   02/18/15 0547 02/19/15 0505  Weight: 50.349 kg (111 lb) 52.617 kg (116 lb)    Exam:   General:  Pt in nad, alert and awake  Cardiovascular: rrr, no mrg  Respiratory: Bryan in place, speaking in full sentences, equal chest rise, wheezes BL on expiration  Abdomen: soft, nd, nt  Musculoskeletal: no cyanosis or clubbing   Data Reviewed: Basic Metabolic Panel:  Recent  Labs Lab 02/18/15 0050 02/18/15 0058 02/18/15 0643  NA 136 137 136  K 3.6 3.7 3.3*  CL 103 101 106  CO2 24  --  23  GLUCOSE 111* 111* 104*  BUN 17 17 18   CREATININE 0.80 0.80 0.73  CALCIUM 8.3*  --  7.9*   Liver Function Tests:  Recent Labs Lab 02/18/15 0050 02/18/15 0643  AST 16 13*  ALT 7* 8*  ALKPHOS 49 44  BILITOT 1.7* 1.8*  PROT 7.1 6.1*  ALBUMIN 3.0* 2.9*   No results for input(s): LIPASE, AMYLASE in the last 168 hours. No results for input(s): AMMONIA in the last 168 hours. CBC:  Recent Labs Lab 02/18/15 0050 02/18/15 0058 02/18/15 0643  WBC 9.5  --  8.8  NEUTROABS  --   --  3.4  HGB 8.9* 9.5* 7.6*  HCT 27.0* 28.0* 23.6*  MCV 96.4  --  95.9  PLT 27*  --  19*   Cardiac Enzymes:  Recent Labs Lab 02/18/15 0130 02/18/15 0145  CKTOTAL 19*  --   TROPONINI  --  0.03   BNP (last 3 results) No results for input(s): BNP in the last 8760 hours.  ProBNP (last 3 results) No results for input(s): PROBNP in the last 8760 hours.  CBG:  Recent Labs Lab 02/18/15 0021  GLUCAP 96    Recent Results (from the past 240 hour(s))  Blood culture (routine x 2)     Status: None (Preliminary result)   Collection Time: 02/18/15  2:41 AM  Result Value Ref Range Status  Specimen Description   Final    BLOOD BLOOD RIGHT FOREARM Performed at [facility]    Special Requests BOTTLES DRAWN AEROBIC AND ANAEROBIC 5CC  Final   Culture PENDING  Incomplete   Report Status PENDING  Incomplete  Urine culture     Status: None   Collection Time: 02/18/15  2:43 AM  Result Value Ref Range Status   Specimen Description URINE, CLEAN CATCH  Final   Special Requests NONE  Final   Culture   Final    NO GROWTH 1 DAY Performed at [facility]    Report Status 02/19/2015 FINAL  Final  Blood culture (routine x 2)     Status: None (Preliminary result)   Collection Time: 02/18/15  2:46 AM  Result Value Ref Range Status   Specimen Description   Final    BLOOD  RIGHT ANTECUBITAL Performed at [facility]    Special Requests BOTTLES DRAWN AEROBIC AND ANAEROBIC 5CC  Final   Culture PENDING  Incomplete   Report Status PENDING  Incomplete     Studies: Dg Chest 2 View  02/18/2015   CLINICAL DATA:  Fever  EXAM: CHEST  2 VIEW  COMPARISON:  09/15/2014  FINDINGS: Chronic cardiomegaly. Stable mild aortic tortuosity when accounting for rotation. Chronic interstitial coarsening and hyperinflation, bronchitic and emphysematous disease based on 12/19/2013 chest CT. No superimposed consolidation. Small bilateral pleural effusions are present. Status post left breast and axillary surgery.  IMPRESSION: COPD and trace bilateral pleural effusion. No pneumonia to explain the history of fever.   Electronically Signed   By: Monte Fantasia M.D.   On: 02/18/2015 03:23   Ct Head Wo Contrast  02/18/2015   CLINICAL DATA:  Blurred vision, generalized weakness.  EXAM: CT HEAD WITHOUT CONTRAST  TECHNIQUE: Contiguous axial images were obtained from the base of the skull through the vertex without intravenous contrast.  COMPARISON:  CT scan of September 15, 2014.  FINDINGS: Bony calvarium appears intact. Mild diffuse cortical atrophy is noted. Mild chronic ischemic white matter disease is noted. Old lacunar infarctions are noted in the right basal ganglia which are unchanged compared to prior exam. No mass effect or midline shift is noted. Ventricular size is within normal limits. There is no evidence of mass lesion, hemorrhage or acute infarction.  IMPRESSION: Mild diffuse cortical atrophy. Mild chronic ischemic white matter disease. No acute intracranial abnormality seen.   Electronically Signed   By: Marijo Conception, M.D.   On: 02/18/2015 08:45    Scheduled Meds: . chlorhexidine  15 mL Mouth Rinse BID  . clindamycin (CLEOCIN) IV  600 mg Intravenous 3 times per day  . FLUoxetine  30 mg Oral q morning - 10a  . ipratropium-albuterol  3 mL Nebulization BID  . predniSONE  50 mg  Oral Q breakfast  . sodium chloride  3 mL Intravenous Q12H   Continuous Infusions: . sodium chloride 50 mL/hr at 02/19/15 0500    Time spent: > 35 minutes   Velvet Bathe  [facility] Pager [phone]. If 7PM-7AM, please contact night-coverage at [email or web address], password [facility] 02/19/2015, 1:26 PM  LOS: 1 day

## 2015-02-19 NOTE — Telephone Encounter (Signed)
OK 

## 2015-02-19 NOTE — Progress Notes (Signed)
[facility] progress note. Chief complaint. Tachycardia, chest pressure. History of present illness. This 81 year old female in hospital with COPD exacerbation. She has no listed history of cardiac arrhythmia that had been taking beta blocker prior to admission. Staff noted patient to have tachycardia with a heart rate in the 160-170 range. 12-lead EKG was obtained and this confirmed atrial flutter with RVR. Patient was given a dose of metoprolol by rapid response but this did not significantly improve the patient's heart rate. I gave the patient a 10 mg bolus of Cardizem with her blood pressure in the 168-106 range. This did return the patient's normal sinus rhythm but unfortunately she became bradycardic briefly with a heart rate in the 30s and complained of some dizziness. Prior to administering Cardizem the patient was complaining of some chest pressure without radiation, diaphoresis, or nausea. 12-lead EKG again did indicate atrial fib with RVR and possible inferolateral ischemia. With the rates now controlled a repeat EKG was obtained and this indicated sinus bradycardia with a rate in the upper 50s. Patient denied any further chest pressure. Physical exam. Vital signs. Temperature 90.8, pulse 169, blood pressure 168/106, respiration 24 with O2 sats 92%. General appearance. Pleasant elderly female who is alert and in no distress. Cardiac. Tachycardic and irregular. Lungs. Breath sounds clear. Abdomen. Soft with positive bowel sounds. Impression/plan. Problem #1. Atrial fibrillation with RVR. Patient did return to sinus rhythm status post a Cardizem but unfortunately also became briefly bradycardic and presyncopal. I've listed diltiazem as a patient intolerance/allergy due to bradycardia and presyncope. Given these events I have elected to move the patient step down for closer monitoring overnight. Left an order for 5 mg Metroprolol when necessary for heart rate greater than 120. Problem #2.  Chest pressure during A. fib with RVR. Suspect this likely represents a degree of demand ischemia. We'll check troponin now and then every 6 hours for a total of 3 sets. Current EKG does not suggest acute ischemia and does indicate resolution of the A. fib with RVR.

## 2015-02-19 NOTE — Telephone Encounter (Deleted)
Daughter, [name], left VM wants to make sure Dr. Alvy Bimler is aware pt is in hospital

## 2015-02-19 NOTE — Evaluation (Signed)
Physical Therapy Evaluation Patient Details Name: Tonya Mckee MRN: [id number] DOB: 04-02-1929 Today's Date: 02/19/2015   History of Present Illness  81 yo female admitted with COPD exac. hs of htn, dementia, COPD, CVA, vertigo, cancer. Pt is from home alone.   Clinical Impression  On eval, pt required Mod assist for bed mobility, Min assist to stand pivot/take a few steps with RW. Pt fatigues easily. Remained on [place] O2. Do not feel pt could safely function/mobilize at home alone Recommend SNF for continued rehab.     Follow Up Recommendations SNF;Supervision/Assistance - 24 hour    Equipment Recommendations  None recommended by PT    Recommendations for Other Services OT consult     Precautions / Restrictions Precautions Precautions: Fall Restrictions Weight Bearing Restrictions: No      Mobility  Bed Mobility Overal bed mobility: Needs Assistance Bed Mobility: Supine to Sit     Supine to sit: Mod assist     General bed mobility comments: Assist for trunk and bil LEs. Utililzed bedpad for scooting, positioning. Increased time.   Transfers Overall transfer level: Needs assistance Equipment used: Rolling walker (2 wheeled) Transfers: Sit to/from [facility] Sit to Stand: Min assist Stand pivot transfers: Min assist       General transfer comment: Assist to rise, stabilize, control descent. VCs safety, hand placement. Increased time and attempts to rise.   Ambulation/Gait Ambulation/Gait assistance: Min assist Ambulation Distance (Feet): 2 Feet Assistive device: Rolling walker (2 wheeled) Gait Pattern/deviations: Decreased stride length;Decreased step length - right;Decreased step length - left;Trunk flexed     General Gait Details: Assist to stabilize and maneuver with RW.   Stairs            Wheelchair Mobility    Modified Rankin (Stroke Patients Only)       Balance Overall balance assessment: Needs assistance;History of Falls          Standing balance support: Bilateral upper extremity supported;During functional activity Standing balance-Leahy Scale: Poor                               Pertinent Vitals/Pain Pain Assessment: No/denies pain    Home Living Family/patient expects to be discharged to:: Skilled nursing facility Living Arrangements: Alone                    Prior Function Level of Independence: Independent with assistive device(s)         Comments: using RW     Hand Dominance        Extremity/Trunk Assessment   Upper Extremity Assessment: Generalized weakness           Lower Extremity Assessment: Generalized weakness      Cervical / Trunk Assessment: Kyphotic  Communication   Communication: HOH  Cognition Arousal/Alertness: Awake/alert Behavior During Therapy: WFL for tasks assessed/performed Overall Cognitive Status: Within Functional Limits for tasks assessed                      General Comments      Exercises        Assessment/Plan    PT Assessment Patient needs continued PT services  PT Diagnosis Difficulty walking;Generalized weakness   PT Problem List Decreased strength;Decreased mobility;Decreased balance;Decreased activity tolerance;Decreased knowledge of use of DME  PT Treatment Interventions DME instruction;Gait training;Functional mobility training;Therapeutic activities;Therapeutic exercise;Patient/family education;Balance training   PT Goals (Current goals can be found  in the Care Plan section) Acute Rehab PT Goals Patient Stated Goal: to sleep PT Goal Formulation: With patient/family Time For Goal Achievement: 03/05/15 Potential to Achieve Goals: Good    Frequency Min 3X/week   Barriers to discharge        Co-evaluation               End of Session Equipment Utilized During Treatment: Oxygen Activity Tolerance: Patient limited by fatigue Patient left: in chair;with call bell/phone within reach;with  family/visitor present           Time: 1510-1539 PT Time Calculation (min) (ACUTE ONLY): 29 min   Charges:   PT Evaluation $Initial PT Evaluation Tier I: 1 Procedure PT Treatments $Therapeutic Activity: 8-22 mins   PT G Codes:        Weston Anna, MPT Pager: [phone]

## 2015-02-19 NOTE — Telephone Encounter (Signed)
VM from daughter, [name], wants to make sure Dr. Alvy Bimler is aware pt is in hospital.  She also asks if Dr. Alvy Bimler can see pt in hospital and if pt is supposed to keep appts on Monday for Platelets and PAC placement?

## 2015-02-19 NOTE — Progress Notes (Signed)
Assumed care of pt at this time. Pt stable and has no complaints. No changes from this am's assessment. Will continue to monitor pt.   Othella Boyer [facility] 02/19/2015

## 2015-02-19 NOTE — Progress Notes (Signed)
Tonya Mckee   DOB:1929/04/20   TD#:[id number]    Subjective: I saw the patient at the request of her daughter. The patient was admitted to the hospital for 2 days because of pain in her jaw, profound weakness, and headaches. The patient had recent dental extraction. She has background history of myelodysplastic syndrome and severe COPD. She was transfused blood. She continues to feel weak. Her dental pain has improved. She denies further headaches The patient denies any recent signs or symptoms of bleeding such as spontaneous epistaxis, hematuria or hematochezia.   Objective:  Filed Vitals:   02/19/15 1345  BP: 125/55  Pulse: 85  Temp: 98 F (36.7 C)  Resp: 24     Intake/Output Summary (Last 24 hours) at 02/19/15 1637 Last data filed at 02/19/15 1300  Gross per 24 hour  Intake 1942.5 ml  Output    200 ml  Net 1742.5 ml    GENERAL:alert, no distress and comfortable. She looked debilitated SKIN: skin color is pale, texture, turgor are normal, with significant bruises EYES: normal, Conjunctiva are paleand non-injected, sclera clear OROPHARYNX:no exudate, no erythema and lips, buccal mucosa, and tongue normal  NECK: supple, thyroid normal size, non-tender, without nodularity Musculoskeletal:no cyanosis of digits and no clubbing  NEURO: alert & oriented x 3 with fluent speech, no focal motor/sensory deficits   Labs:  Lab Results  Component Value Date   WBC 13.4* 02/19/2015   HGB 9.9* 02/19/2015   HCT 29.8* 02/19/2015   MCV 92.0 02/19/2015   PLT 29* 02/19/2015   NEUTROABS 3.4 02/18/2015    Lab Results  Component Value Date   NA 136 02/18/2015   K 4.0 02/19/2015   CL 106 02/18/2015   CO2 23 02/18/2015    Studies:  Dg Chest 2 View  02/18/2015   CLINICAL DATA:  Fever  EXAM: CHEST  2 VIEW  COMPARISON:  09/15/2014  FINDINGS: Chronic cardiomegaly. Stable mild aortic tortuosity when accounting for rotation. Chronic interstitial coarsening and hyperinflation, bronchitic and  emphysematous disease based on 12/19/2013 chest CT. No superimposed consolidation. Small bilateral pleural effusions are present. Status post left breast and axillary surgery.  IMPRESSION: COPD and trace bilateral pleural effusion. No pneumonia to explain the history of fever.   Electronically Signed   By: Monte Fantasia M.D.   On: 02/18/2015 03:23   Ct Head Wo Contrast  02/18/2015   CLINICAL DATA:  Blurred vision, generalized weakness.  EXAM: CT HEAD WITHOUT CONTRAST  TECHNIQUE: Contiguous axial images were obtained from the base of the skull through the vertex without intravenous contrast.  COMPARISON:  CT scan of September 15, 2014.  FINDINGS: Bony calvarium appears intact. Mild diffuse cortical atrophy is noted. Mild chronic ischemic white matter disease is noted. Old lacunar infarctions are noted in the right basal ganglia which are unchanged compared to prior exam. No mass effect or midline shift is noted. Ventricular size is within normal limits. There is no evidence of mass lesion, hemorrhage or acute infarction.  IMPRESSION: Mild diffuse cortical atrophy. Mild chronic ischemic white matter disease. No acute intracranial abnormality seen.   Electronically Signed   By: Marijo Conception, M.D.   On: 02/18/2015 08:45    Assessment & Plan:  MDS (myelodysplastic syndrome) with 5q deletion She has profound bone marrow suppression and pancytopenia due to recent dental infection. At present time, she will continue to hold Revlimid and remain on prednisone. We will continue transfusion support and blood work monitoring weekly.  Anemia in neoplastic  disease This is likely anemia of chronic disease and related to her bone marrow disorder. The patient denies recent history of bleeding such as epistaxis, hematuria or hematochezia. She will get 2 units of blood whenever hemoglobin dropped to less than 8 g  Thrombocytopenia due to drugs and recent infection This is likely due to recent infection and underlying  disease. The patient denies recent history of bleeding such as epistaxis, hematuria or hematochezia. She is asymptomatic from the low platelet count. I will observe for now.  She will get platelet transfusion if it dropped to less than 10,000 or if she has signs of bleeding  Dental infection She is currently receiving broad-spectrum IV anti-biotics and is feeling better  Poor venous access She was originally scheduled for port placement on Monday. However, in view of recent infection, I recommend deferring port placement and delayed by at least 1 week.  Profound weakness She is undergoing physical therapy evaluation and will likely be discharged to skilled nursing facility  Intermittent headaches CT scan was negative.  I will follow.  [facility], [place], MD 02/19/2015  4:37 PM

## 2015-02-19 NOTE — Progress Notes (Addendum)
[place] by charge nurse that HR in 160's.  Nurses to room.  138/106;163HR 44RR; O2 sat 92% on 1 liter.  Patient complains of chest tightness/ mid-sternal chest pain, teeth pain and SOB.  12 lead EKG.  Pain rated at 8 on scale of 0-10.  1855- 168/106; 169; 40; O2 sat 90%,  O2 increased to 2 liters.  Dr. Justus Memory paged with assessment.  Metoprolol 5 mg IV given. EKG shows AFIB with RVR HR 170.  Rapid response team paged. And Night floor coverage paged per Dr. Wendee Beavers request.  1910- Rapid response RN to room to assess patient and 12 Lead EKG results.  1920- cardizem bolus of 10mg  given, then started 5mg /hr. Immediately after bolus pt states she feels she is going to pass out. HR dropped to 30's then up to 50's. During episode pt had a 3 sec pause. Night coverage at bedside and made aware. Cardizem gtt stopped.  Another EKG obtained and showed SB HR 59. Pt states she feels better. Pt being transferred to stepdown for closer monitoring of HR.   Othella Boyer [facility] 02/19/2015 7:35 PM

## 2015-02-20 ENCOUNTER — Other Ambulatory Visit: Payer: Self-pay | Admitting: *Deleted

## 2015-02-20 ENCOUNTER — Telehealth: Payer: Self-pay | Admitting: *Deleted

## 2015-02-20 ENCOUNTER — Telehealth: Payer: Self-pay | Admitting: Hematology and Oncology

## 2015-02-20 DIAGNOSIS — R509 Fever, unspecified: Secondary | ICD-10-CM

## 2015-02-20 DIAGNOSIS — J449 Chronic obstructive pulmonary disease, unspecified: Secondary | ICD-10-CM

## 2015-02-20 DIAGNOSIS — R6884 Jaw pain: Secondary | ICD-10-CM

## 2015-02-20 DIAGNOSIS — I480 Paroxysmal atrial fibrillation: Principal | ICD-10-CM

## 2015-02-20 DIAGNOSIS — I495 Sick sinus syndrome: Secondary | ICD-10-CM | POA: Insufficient documentation

## 2015-02-20 DIAGNOSIS — I10 Essential (primary) hypertension: Secondary | ICD-10-CM

## 2015-02-20 DIAGNOSIS — I4891 Unspecified atrial fibrillation: Secondary | ICD-10-CM | POA: Diagnosis present

## 2015-02-20 LAB — CBC
HEMATOCRIT: 30.8 % — AB (ref 36.0–46.0)
HEMOGLOBIN: 10.2 g/dL — AB (ref 12.0–15.0)
MCH: 30.6 pg (ref 26.0–34.0)
MCHC: 33.1 g/dL (ref 30.0–36.0)
MCV: 92.5 fL (ref 78.0–100.0)
PLATELETS: 30 10*3/uL — AB (ref 150–400)
RBC: 3.33 MIL/uL — ABNORMAL LOW (ref 3.87–5.11)
RDW: 20.8 % — ABNORMAL HIGH (ref 11.5–15.5)
WBC: 9.8 10*3/uL (ref 4.0–10.5)

## 2015-02-20 LAB — BASIC METABOLIC PANEL
Anion gap: 7 (ref 5–15)
BUN: 15 mg/dL (ref 6–20)
CALCIUM: 8.5 mg/dL — AB (ref 8.9–10.3)
CHLORIDE: 107 mmol/L (ref 101–111)
CO2: 22 mmol/L (ref 22–32)
Creatinine, Ser: 0.55 mg/dL (ref 0.44–1.00)
GFR calc Af Amer: >60 mL/min (ref >60)
GFR calc non Af Amer: >60 mL/min (ref >60)
Glucose, Bld: 146 mg/dL — ABNORMAL HIGH (ref 65–99)
POTASSIUM: 4.4 mmol/L (ref 3.5–5.1)
SODIUM: 136 mmol/L (ref 135–145)

## 2015-02-20 LAB — TROPONIN I
Troponin I: <0.03 ng/mL (ref <0.031)
Troponin I: 0.04 ng/mL — ABNORMAL HIGH (ref <0.031)

## 2015-02-20 MED ORDER — OXYCODONE HCL 5 MG PO TABS
5.0000 mg | ORAL_TABLET | Freq: Once | ORAL | Status: AC
  Administered 2015-02-20: 5 mg via ORAL
  Filled 2015-02-20: qty 1

## 2015-02-20 MED ORDER — HYDRALAZINE HCL 20 MG/ML IJ SOLN
10.0000 mg | Freq: Once | INTRAMUSCULAR | Status: AC
  Administered 2015-02-20: 10 mg via INTRAVENOUS
  Filled 2015-02-20: qty 1

## 2015-02-20 MED ORDER — OXYCODONE HCL 5 MG PO TABS: 5.0000 mg | ORAL_TABLET | Freq: Every morning | ORAL | Status: DC

## 2015-02-20 MED ORDER — METOPROLOL SUCCINATE ER 25 MG PO TB24: 50.0000 mg | ORAL_TABLET | Freq: Every day | ORAL | Status: DC

## 2015-02-20 MED ORDER — OXYCODONE HCL 5 MG PO TABS
5.0000 mg | ORAL_TABLET | ORAL | Status: DC | PRN
  Administered 2015-02-20: 5 mg via ORAL
  Filled 2015-02-20: qty 1

## 2015-02-20 MED ORDER — METOPROLOL SUCCINATE ER 50 MG PO TB24
50.0000 mg | ORAL_TABLET | Freq: Every day | ORAL | Status: DC
  Administered 2015-02-20 – 2015-02-24 (×5): 50 mg via ORAL
  Filled 2015-02-20 (×3): qty 1
  Filled 2015-02-20: qty 2
  Filled 2015-02-20: qty 1

## 2015-02-20 MED ORDER — OXYCODONE HCL 5 MG PO TABS
10.0000 mg | ORAL_TABLET | ORAL | Status: DC | PRN
  Administered 2015-02-22: 10 mg via ORAL
  Filled 2015-02-20: qty 2

## 2015-02-20 MED ORDER — HYDRALAZINE HCL 20 MG/ML IJ SOLN
20.0000 mg | Freq: Four times a day (QID) | INTRAMUSCULAR | Status: DC | PRN
  Administered 2015-02-20 – 2015-02-21 (×2): 20 mg via INTRAVENOUS
  Filled 2015-02-20 (×3): qty 1

## 2015-02-20 NOTE — Telephone Encounter (Signed)
Left message with pt's daughter, [name] regarding upcoming apts. Pt to be seen for labs and MD visit on 7/14.   Port placement rescheduled for 7/19 with possible platelet transfusion prior to [facility] placement.

## 2015-02-20 NOTE — Progress Notes (Signed)
[facility] PROGRESS NOTE  Tonya Mckee OMV:[id number] DOB: 05-23-1929 DOA: 02/17/2015 PCP: Tonya Mckee  Brief Narrative: Patient is a 81 year old with history of COPD, myelodysplastic syndrome with 5 every deletion with pancytopenia, essential hypertension, chronic respiratory failure, chronic headache. Who presented complaining of fatigue. During hospital stay patient developed atrial fibrillation and beta blocker was continued patient developed pauses and cardiology subsequently consulted. Plan is to transition to [facility] cone for possible pacemaker placement  Assessment/Plan:  Principal Problem: Atrial fibrillation with RVR - Consulted cardiology and currently they are managing - Initially I continued metoprolol XL at her home dose when patient's heart rate was 130s to 140s. Patient subsequently developed some positives and currently is suspect that she has sick sinus syndrome. Will transfer to [place] at cardiology's request for possible pacemaker placement and EP evaluation.    COPD exacerbation - on levaquin -on solumedrol  - supplemental oxygen, wean as tolerated. - continue duonebs   Active Problems:   Essential hypertension - blood pressures relatively well controlled on current regimen    MDS (myelodysplastic syndrome) with 5q deletion - transfusing if hgb less than 8.0. Repeat cbc pending.    Persistent headaches - May be secondary to patient not resting well. - no focal neurological symptoms reported and CT scan of head reported no acute intracranial abnormality    Thrombocytopenia due to drugs - will avoid heparin, scd's for dvt prophylaxis    General weakness - Consulted PT  Code Status: full Family Communication: discussed with son at bedside.  Disposition Plan: Pending improvement in condition   Consultants:  none  Procedures:  none  Antibiotics:  Clindamycin>>>levaquin on 7/7   HPI/Subjective: Pt has complaints about jaw  discomfort today  Objective: Filed Vitals:   02/20/15 1100  BP: 164/93  Pulse: 106  Temp:   Resp: 25    Intake/Output Summary (Last 24 hours) at 02/20/15 1326 Last data filed at 02/20/15 0600  Gross per 24 hour  Intake    510 ml  Output   1775 ml  Net  -1265 ml   Filed Weights   02/18/15 0547 02/19/15 0505 02/19/15 2000  Weight: 50.349 kg (111 lb) 52.617 kg (116 lb) 55.4 kg (122 lb 2.2 oz)    Exam:   General:  Pt in nad, alert and awake  Cardiovascular: irregularly irregular , no mrg  Respiratory: Mentor in place, speaking in full sentences, equal chest rise, no wheezes  Abdomen: soft, nd, nt  Musculoskeletal: no cyanosis or clubbing   Data Reviewed: Basic Metabolic Panel:  Recent Labs Lab 02/18/15 0050 02/18/15 0058 02/18/15 0643 02/19/15 1330 02/20/15 0225  NA 136 137 136  --  136  K 3.6 3.7 3.3* 4.0 4.4  CL 103 101 106  --  107  CO2 24  --  23  --  22  GLUCOSE 111* 111* 104*  --  146*  BUN 17 17 18   --  15  CREATININE 0.80 0.80 0.73  --  0.55  CALCIUM 8.3*  --  7.9*  --  8.5*   Liver Function Tests:  Recent Labs Lab 02/18/15 0050 02/18/15 0643  AST 16 13*  ALT 7* 8*  ALKPHOS 49 44  BILITOT 1.7* 1.8*  PROT 7.1 6.1*  ALBUMIN 3.0* 2.9*   No results for input(s): LIPASE, AMYLASE in the last 168 hours. No results for input(s): AMMONIA in the last 168 hours. CBC:  Recent Labs Lab 02/18/15 0050 02/18/15 0058 02/18/15 6283 02/19/15 1330 02/20/15 0225  WBC 9.5  --  8.8 13.4* 9.8  NEUTROABS  --   --  3.4  --   --   HGB 8.9* 9.5* 7.6* 9.9* 10.2*  HCT 27.0* 28.0* 23.6* 29.8* 30.8*  MCV 96.4  --  95.9 92.0 92.5  PLT 27*  --  19* 29* 30*   Cardiac Enzymes:  Recent Labs Lab 02/18/15 0130 02/18/15 0145 02/19/15 1935 02/20/15 0225 02/20/15 0645  CKTOTAL 19*  --   --   --   --   TROPONINI  --  0.03 <0.03 <0.03 0.04*   BNP (last 3 results) No results for input(s): BNP in the last 8760 hours.  ProBNP (last 3 results) No results for  input(s): PROBNP in the last 8760 hours.  CBG:  Recent Labs Lab 02/18/15 0021  GLUCAP 96    Recent Results (from the past 240 hour(s))  Blood culture (routine x 2)     Status: None (Preliminary result)   Collection Time: 02/18/15  2:41 AM  Result Value Ref Range Status   Specimen Description BLOOD BLOOD RIGHT FOREARM  Final   Special Requests BOTTLES DRAWN AEROBIC AND ANAEROBIC 5CC  Final   Culture   Final    NO GROWTH 1 DAY Performed at [facility]    Report Status PENDING  Incomplete  Urine culture     Status: None   Collection Time: 02/18/15  2:43 AM  Result Value Ref Range Status   Specimen Description URINE, CLEAN CATCH  Final   Special Requests NONE  Final   Culture   Final    NO GROWTH 1 DAY Performed at [facility]    Report Status 02/19/2015 FINAL  Final  Blood culture (routine x 2)     Status: None (Preliminary result)   Collection Time: 02/18/15  2:46 AM  Result Value Ref Range Status   Specimen Description BLOOD RIGHT ANTECUBITAL  Final   Special Requests BOTTLES DRAWN AEROBIC AND ANAEROBIC 5CC  Final   Culture   Final    NO GROWTH 1 DAY Performed at [facility]    Report Status PENDING  Incomplete  MRSA PCR Screening     Status: None   Collection Time: 02/19/15  8:16 PM  Result Value Ref Range Status   MRSA by PCR NEGATIVE NEGATIVE Final    Comment:        The GeneXpert MRSA Assay (FDA approved for NASAL specimens only), is one component of a comprehensive MRSA colonization surveillance program. It is not intended to diagnose MRSA infection nor to guide or monitor treatment for MRSA infections.      Studies: No results found.  Scheduled Meds: . chlorhexidine  15 mL Mouth Rinse BID  . [START ON 02/21/2015] digoxin  0.125 mg Intravenous Daily  . FLUoxetine  30 mg Oral q morning - 10a  . ipratropium-albuterol  3 mL Nebulization BID  . levofloxacin (LEVAQUIN) IV  500 mg Intravenous Q24H  . methylPREDNISolone  (SOLU-MEDROL) injection  40 mg Intravenous Q12H  . metoprolol  5 mg Intravenous Once  . metoprolol succinate  50 mg Oral Daily  . sodium chloride  3 mL Intravenous Q12H   Continuous Infusions: . sodium chloride 50 mL/hr at 02/20/15 0300    Time spent: > 35 minutes   Velvet Bathe  [facility] Pager [phone]. If 7PM-7AM, please contact night-coverage at [email or web address], password [facility] 02/20/2015, 1:26 PM  LOS: 2 days

## 2015-02-20 NOTE — Consult Note (Signed)
Reason for Consult:   Weakness, AF, SSS  Requesting Physician: [facility] Primary Cardiologist New  HPI: This is a 81 y.o. female with a past medical history significant for MDS, thrombocytopenia, COPD, and HTN. She reportedly had some tachycardia in the past when her Toprol was stopped and was seen in the ED. This resolved with resumption of Toprol. The family denies any history of atrial fibrillation or prior cardiac evaluation. She was admitted 02/17/15 with weakness. She lives alone and her daughter couldn't get in touch with her and called the police. They found her on the couch c/o weakness. In the ED it was suspected she may have a UTI but cultures are negative. She was bradycardic and her beta blocker was held. She was not dehydrated by her BUN/SCr. Last PM she went into rapid AF. Her beta blocker was resumed and she then had pauses up to 5.3 seconds on telemetry. She briefly converted to sinus brady but is now back in AF with VR around 110. The pt denies any true syncope. She fell (tripped) in Feb and suffered a fx to her L-spine. She has recently had dental surgery (April). She has chronic anemia, and thrombocytopenia, followed by Dr Alvy Bimler. She is on chronic steroids and Revlimid.   PMHx:  Past Medical History  Diagnosis Date  . COPD (chronic obstructive pulmonary disease)   . Hypertension   . PONV (postoperative nausea and vomiting)   . Dysrhythmia     rapid heart rate  . Stroke 2010    mini stroke  . Pneumonia 2009  . Shortness of breath   . GERD (gastroesophageal reflux disease)     long time ago  . Depression   . DEMENTIA     borderline  . Depression 01/02/2012  . Anemia 01/02/2012  . Artery stenosis 10/30/12    Moderate to severe left extracranial vertebral  . Cancer 20years ago+ 1999    lt breast  left  . SCC (squamous cell carcinoma), leg     Right  LE Dr. [place] Crane  . Arthritis     Osteo-  Spine and Knees  . Vitamin B12 deficiency   . Vertigo, benign  positional   . Memory loss   . Blepharospasm     Left eye  . Venous stasis ulcers 2012    Left LE due to trauma  . Cellulitis Jan 01, 2012    Left UE  . Carotid artery occlusion     Bruit- RIGHT  . Occlusion and stenosis of carotid artery without mention of cerebral infarction 11/06/2012  . Persistent headaches 02/26/2014  . Chronic respiratory failure     on NCO2     Past Surgical History  Procedure Laterality Date  . Abdominal hysterectomy    . Appendectomy    . Breast surgery    . Joint replacement      Lt arm/wrist plates and screws  . Eye surgery      L/R catarects    SOCHx:  reports that she quit smoking about 5 months ago. Her smoking use included Cigarettes. She has a 34 pack-year smoking history. She has never used smokeless tobacco. She reports that she does not drink alcohol or use illicit drugs.  FAMHx: Family History  Problem Relation Age of Onset  . Adopted: Yes    ALLERGIES: Allergies  Allergen Reactions  . Donepezil Nausea And Vomiting  . Penicillins Itching and Nausea And Vomiting  . Amoxapine And Related  Nausea Only  . Doxycycline Rash  . Keflex [Cephalexin] Itching  . Bupropion Other (See Comments)    "Headaches on the XL."   . Cardizem [Diltiazem Hcl] Other (See Comments)    Bradycardia/presyncope  . Ketek [Telithromycin] Itching    Dry Mouth  . Levaquin [Levofloxacin In D5w] Other (See Comments)    Insomnia  . Sulfa Antibiotics Nausea And Vomiting  . Tramadol Other (See Comments)    "Made her feel like a zombie."  . Zyrtec [Cetirizine] Other (See Comments)    "made her like a zombie"    ROS: Pertinent items are noted in HPI. see H&P for complete ROS  She is HOH and on chronic O2  HOME MEDICATIONS: Prior to Admission medications   Medication Sig Start Date End Date Taking? Authorizing Provider  acetaminophen (TYLENOL) 325 MG tablet Take 650 mg by mouth every 6 (six) hours as needed for moderate pain (pain).    Yes Historical  Provider, MD  acetaminophen-codeine (TYLENOL #3) 300-30 MG per tablet Take 1-2 tablets by mouth every 4 (four) hours as needed. pain 12/06/14  Yes Historical Provider, MD  albuterol (PROVENTIL HFA;VENTOLIN HFA) 108 (90 BASE) MCG/ACT inhaler Inhale 2 puffs into the lungs every 6 (six) hours as needed for wheezing or shortness of breath (wheezing).    Yes Historical Provider, MD  calcium carbonate (TUMS - DOSED IN MG ELEMENTAL CALCIUM) 500 MG chewable tablet Chew 1 tablet by mouth daily as needed for indigestion or heartburn.   Yes Historical Provider, MD  chlorhexidine (PERIDEX) 0.12 % solution 15 mLs by Mouth Rinse route 2 (two) times daily. 09/17/14  Yes Robbie Lis, MD  Cholecalciferol (VITAMIN D) 2000 UNITS tablet Take 2,000 Units by mouth daily.   Yes Historical Provider, MD  FLUoxetine (PROZAC) 10 MG capsule Take 3 capsules (30 mg total) by mouth every morning. 09/17/14  Yes Robbie Lis, MD  ipratropium-albuterol (DUONEB) 0.5-2.5 (3) MG/3ML SOLN Take 3 mLs by nebulization 2 (two) times daily as needed (wheezing). 09/17/14  Yes Robbie Lis, MD  LORazepam (ATIVAN) 0.5 MG tablet Take 1 tablet (0.5 mg total) by mouth every 8 (eight) hours as needed for anxiety (anxiety). 09/17/14  Yes Robbie Lis, MD  metoprolol succinate (TOPROL-XL) 50 MG 24 hr tablet Take 50 mg by mouth every evening. Take with or immediately following a meal.   Yes Historical Provider, MD  predniSONE (DELTASONE) 2.5 MG tablet Take 1 tablet (2.5 mg total) by mouth daily with breakfast. 12/12/14  Yes Heath Lark, MD  tiotropium (SPIRIVA) 18 MCG inhalation capsule Place 18 mcg into inhaler and inhale daily.   Yes Historical Provider, MD  valsartan (DIOVAN) 40 MG tablet Take 1 tablet by mouth daily. 01/31/15  Yes Historical Provider, MD  famotidine (PEPCID AC) 10 MG tablet Take 1 tablet (10 mg total) by mouth 2 (two) times daily. Patient not taking: Reported on 02/18/2015 09/17/14   Robbie Lis, MD  lenalidomide (REVLIMID) 2.5 MG capsule  Take 1 capsule (2.5 mg total) by mouth daily. Patient not taking: Reported on 02/18/2015 02/09/15   Heath Lark, MD  menthol-cetylpyridinium (CEPACOL) 3 MG lozenge Take 1 lozenge (3 mg total) by mouth as needed for sore throat. Patient not taking: Reported on 02/18/2015 08/01/14   Nishant Dhungel, MD  ondansetron (ZOFRAN) 4 MG tablet Take 1 tablet (4 mg total) by mouth every 6 (six) hours as needed for nausea. Patient not taking: Reported on 02/18/2015 09/17/14   Robbie Lis, MD  polyethylene glycol (MIRALAX / GLYCOLAX) packet Take 17 g by mouth daily. Patient not taking: Reported on 02/18/2015 09/17/14   Robbie Lis, MD  valsartan (DIOVAN) 160 MG tablet Take 1 tablet (160 mg total) by mouth daily. Patient not taking: Reported on 02/18/2015 10/06/14   Tanda Rockers, MD    HOSPITAL MEDICATIONS: I have reviewed the patient's current medications.  VITALS: Blood pressure 164/93, pulse 106, temperature 97.4 F (36.3 C), temperature source Oral, resp. rate 25, height 5\' 1"  (1.549 m), weight 122 lb 2.2 oz (55.4 kg), SpO2 96 %.  PHYSICAL EXAM: General appearance: alert, cooperative, no distress and pale Neck: no carotid bruit and no JVD Lungs: clear to auscultation bilaterally Heart: irregularly irregular rhythm Abdomen: soft, non-tender; bowel sounds normal; no masses,  no organomegaly Extremities: extremities normal, atraumatic, no cyanosis or edema Pulses: 2+ and symmetric Skin: pale, cool, dry Neurologic: Grossly normal  LABS: Results for orders placed or performed during the hospital encounter of 02/17/15 (from the past 24 hour(s))  CBC     Status: Abnormal   Collection Time: 02/19/15  1:30 PM  Result Value Ref Range   WBC 13.4 (H) 4.0 - 10.5 K/uL   RBC 3.24 (L) 3.87 - 5.11 MIL/uL   Hemoglobin 9.9 (L) 12.0 - 15.0 g/dL   HCT 29.8 (L) 36.0 - 46.0 %   MCV 92.0 78.0 - 100.0 fL   MCH 30.6 26.0 - 34.0 pg   MCHC 33.2 30.0 - 36.0 g/dL   RDW 20.8 (H) 11.5 - 15.5 %   Platelets 29 (LL) 150 - 400  K/uL  Potassium     Status: None   Collection Time: 02/19/15  1:30 PM  Result Value Ref Range   Potassium 4.0 3.5 - 5.1 mmol/L  Troponin I (q 6hr x 3)     Status: None   Collection Time: 02/19/15  7:35 PM  Result Value Ref Range   Troponin I <0.03 <0.031 ng/mL  MRSA PCR Screening     Status: None   Collection Time: 02/19/15  8:16 PM  Result Value Ref Range   MRSA by PCR NEGATIVE NEGATIVE  CBC     Status: Abnormal   Collection Time: 02/20/15  2:25 AM  Result Value Ref Range   WBC 9.8 4.0 - 10.5 K/uL   RBC 3.33 (L) 3.87 - 5.11 MIL/uL   Hemoglobin 10.2 (L) 12.0 - 15.0 g/dL   HCT 30.8 (L) 36.0 - 46.0 %   MCV 92.5 78.0 - 100.0 fL   MCH 30.6 26.0 - 34.0 pg   MCHC 33.1 30.0 - 36.0 g/dL   RDW 20.8 (H) 11.5 - 15.5 %   Platelets 30 (L) 150 - 400 K/uL  Basic metabolic panel     Status: Abnormal   Collection Time: 02/20/15  2:25 AM  Result Value Ref Range   Sodium 136 135 - 145 mmol/L   Potassium 4.4 3.5 - 5.1 mmol/L   Chloride 107 101 - 111 mmol/L   CO2 22 22 - 32 mmol/L   Glucose, Bld 146 (H) 65 - 99 mg/dL   BUN 15 6 - 20 mg/dL   Creatinine, Ser 0.55 0.44 - 1.00 mg/dL   Calcium 8.5 (L) 8.9 - 10.3 mg/dL   GFR calc non Af Amer >60 >60 mL/min   GFR calc Af Amer >60 >60 mL/min   Anion gap 7 5 - 15  Troponin I (q 6hr x 3)     Status: None   Collection Time: 02/20/15  2:25 AM  Result Value Ref Range   Troponin I <0.03 <0.031 ng/mL  Troponin I (q 6hr x 3)     Status: Abnormal   Collection Time: 02/20/15  6:45 AM  Result Value Ref Range   Troponin I 0.04 (H) <0.031 ng/mL    EKG: NSR, PAC 02/17/15  IMAGING: Reviewed- CT negative for CVA  IMPRESSION: Principal Problem:   General weakness- suspect this was secondary to bradycardia at home  Active Problems:   Atrial fibrillation   SSS (sick sinus syndrome)   Essential hypertension   COPD GOLD II   MDS (myelodysplastic syndrome) with 5q deletion   Dizziness and giddiness   Chronic respiratory failure- home O2    Thrombocytopenia due to drugs-plts 30k   Tobacco abuse- quit Feb 2016   Persistent headaches   Fracture of lumbar spine after a fall- Feb 2016   RECOMMENDATION: Will review with Dr Tamala Julian. She is at some risk for a pacemaker with recent dental surgery, (her oncologist put off port-o-cath placement becasue of infection risk). Hold beta blocker, transfer to [facility] (discussed with Dr Wendee Beavers, we will consult), EP consult, check TSH and Mg++, high risk for anticoagulation secondary to anemia, thrombocytopenia, and history of falls.   Time Spent Directly with Patient: 50 minutes  Erlene Quan [phone] beeper 02/20/2015, 12:25 PM

## 2015-02-20 NOTE — Progress Notes (Signed)
IR PA aware of patient's admission, scheduled for outpatient port a catheter on 02/23/15, per Oncology will defer port placement until atleast 1 week when infection has fully resolved. IR scheduling to setup port week of 7/18  [name] [facility] Interventional Radiology  02/20/15  8:27 AM

## 2015-02-20 NOTE — Consult Note (Signed)
ELECTROPHYSIOLOGY CONSULT NOTE    Patient ID: Tonya Mckee MRN: [id number], DOB/AGE: 79-Jul-1930 79 y.o.  Admit date: 02/17/2015 Date of Consult: 02/20/2015  Primary Physician: [name] Primary Cardiologist: new to [facility]  Reason for Consultation: atrial fibrillation with post termination pauses  HPI:  Tonya Mckee is a 81 y.o. female with a past medical history significant for MDS, thrombocytopenia, dementia, COPD, and hypertension. She was admitted 02/17/15 with weakness and fever.  On the evening of 02/19/15, she went into AF with RVR and was placed on Cardizem drip.  She then had a post conversion pause of 5.3 seconds on IV diltiazem.  She has had recurrent AF since.  There is concern about need for possible pacemaker placement and EP has been asked to evaluate for treatment options.  She currently denies chest pain or SOB. She did not have symptoms with her pause.  She has not had frank syncope.  TMax in last 48 hours 101.2.  She is pending port a cath placement wi th IR; however, they would like to defer for at least 1 week when her dental infection is completely resolved (she underwent recent dental extraction).  There is no echo in the chart.   Past Medical History  Diagnosis Date  . COPD (chronic obstructive pulmonary disease)   . Hypertension   . PONV (postoperative nausea and vomiting)   . Dysrhythmia     rapid heart rate  . Stroke 2010    mini stroke  . Pneumonia 2009  . Shortness of breath   . GERD (gastroesophageal reflux disease)     long time ago  . Depression   . DEMENTIA     borderline  . Depression 01/02/2012  . Anemia 01/02/2012  . Artery stenosis 10/30/12    Moderate to severe left extracranial vertebral  . Cancer 20years ago+ 1999    lt breast  left  . SCC (squamous cell carcinoma), leg     Right  LE Dr. Nevada Crane  . Arthritis     Osteo-  Spine and Knees  . Vitamin B12 deficiency   . Vertigo, benign positional   . Memory loss   .  Blepharospasm     Left eye  . Venous stasis ulcers 2012    Left LE due to trauma  . Cellulitis Jan 01, 2012    Left UE  . Carotid artery occlusion     Bruit- RIGHT  . Occlusion and stenosis of carotid artery without mention of cerebral infarction 11/06/2012  . Persistent headaches 02/26/2014  . Chronic respiratory failure     on NCO2      Surgical History:  Past Surgical History  Procedure Laterality Date  . Abdominal hysterectomy    . Appendectomy    . Breast surgery    . Joint replacement      Lt arm/wrist plates and screws  . Eye surgery      L/R catarects     Prescriptions prior to admission  Medication Sig Dispense Refill Last Dose  . acetaminophen (TYLENOL) 325 MG tablet Take 650 mg by mouth every 6 (six) hours as needed for moderate pain (pain).    Past Week at Unknown time  . acetaminophen-codeine (TYLENOL #3) 300-30 MG per tablet Take 1-2 tablets by mouth every 4 (four) hours as needed. pain  0 Past Week at Unknown time  . albuterol (PROVENTIL HFA;VENTOLIN HFA) 108 (90 BASE) MCG/ACT inhaler Inhale 2 puffs into the lungs every 6 (six) hours as  needed for wheezing or shortness of breath (wheezing).    Past Week at Unknown time  . calcium carbonate (TUMS - DOSED IN MG ELEMENTAL CALCIUM) 500 MG chewable tablet Chew 1 tablet by mouth daily as needed for indigestion or heartburn.   Past Week at Unknown time  . chlorhexidine (PERIDEX) 0.12 % solution 15 mLs by Mouth Rinse route 2 (two) times daily. 120 mL 0 Past Week at Unknown time  . Cholecalciferol (VITAMIN D) 2000 UNITS tablet Take 2,000 Units by mouth daily.   Past Week at Unknown time  . FLUoxetine (PROZAC) 10 MG capsule Take 3 capsules (30 mg total) by mouth every morning. 30 capsule 0 Past Week at Unknown time  . ipratropium-albuterol (DUONEB) 0.5-2.5 (3) MG/3ML SOLN Take 3 mLs by nebulization 2 (two) times daily as needed (wheezing). 360 mL 0 Past Week at Unknown time  . LORazepam (ATIVAN) 0.5 MG tablet Take 1 tablet (0.5  mg total) by mouth every 8 (eight) hours as needed for anxiety (anxiety). 30 tablet 0 Past Month at Unknown time  . metoprolol succinate (TOPROL-XL) 50 MG 24 hr tablet Take 50 mg by mouth every evening. Take with or immediately following a meal.   Past Week at 2200  . predniSONE (DELTASONE) 2.5 MG tablet Take 1 tablet (2.5 mg total) by mouth daily with breakfast. 30 tablet 3   . tiotropium (SPIRIVA) 18 MCG inhalation capsule Place 18 mcg into inhaler and inhale daily.   Past Week at Unknown time  . valsartan (DIOVAN) 40 MG tablet Take 1 tablet by mouth daily.  2 Past Week at Unknown time  . famotidine (PEPCID AC) 10 MG tablet Take 1 tablet (10 mg total) by mouth 2 (two) times daily. (Patient not taking: Reported on 02/18/2015) 30 tablet 0 Not Taking at Unknown time  . lenalidomide (REVLIMID) 2.5 MG capsule Take 1 capsule (2.5 mg total) by mouth daily. (Patient not taking: Reported on 02/18/2015) 28 capsule 0 Not Taking at Unknown time  . menthol-cetylpyridinium (CEPACOL) 3 MG lozenge Take 1 lozenge (3 mg total) by mouth as needed for sore throat. (Patient not taking: Reported on 02/18/2015) 30 tablet 0 Not Taking at Unknown time  . ondansetron (ZOFRAN) 4 MG tablet Take 1 tablet (4 mg total) by mouth every 6 (six) hours as needed for nausea. (Patient not taking: Reported on 02/18/2015) 20 tablet 0 Not Taking at Unknown time  . polyethylene glycol (MIRALAX / GLYCOLAX) packet Take 17 g by mouth daily. (Patient not taking: Reported on 02/18/2015) 14 each 0 Not Taking at Unknown time  . valsartan (DIOVAN) 160 MG tablet Take 1 tablet (160 mg total) by mouth daily. (Patient not taking: Reported on 02/18/2015) 30 tablet 11 Not Taking at Unknown time    Inpatient Medications:  . chlorhexidine  15 mL Mouth Rinse BID  . [START ON 02/21/2015] digoxin  0.125 mg Intravenous Daily  . FLUoxetine  30 mg Oral q morning - 10a  . ipratropium-albuterol  3 mL Nebulization BID  . levofloxacin (LEVAQUIN) IV  500 mg Intravenous Q24H  .  methylPREDNISolone (SOLU-MEDROL) injection  40 mg Intravenous Q12H  . metoprolol  5 mg Intravenous Once  . metoprolol succinate  50 mg Oral Daily  . sodium chloride  3 mL Intravenous Q12H    Allergies:  Allergies  Allergen Reactions  . Donepezil Nausea And Vomiting  . Penicillins Itching and Nausea And Vomiting  . Amoxapine And Related Nausea Only  . Doxycycline Rash  . Keflex [Cephalexin] Itching  .  Bupropion Other (See Comments)    "Headaches on the XL."   . Cardizem [Diltiazem Hcl] Other (See Comments)    Bradycardia/presyncope  . Ketek [Telithromycin] Itching    Dry Mouth  . Levaquin [Levofloxacin In D5w] Other (See Comments)    Insomnia  . Sulfa Antibiotics Nausea And Vomiting  . Tramadol Other (See Comments)    "Made her feel like a zombie."  . Zyrtec [Cetirizine] Other (See Comments)    "made her like a zombie"    History   Social History  . Marital Status: Widowed    Spouse Name: N/A  . Number of Children: N/A  . Years of Education: N/A   Occupational History  . Not on file.   Social History Main Topics  . Smoking status: Former Smoker -- 0.50 packs/day for 68 years    Types: Cigarettes    Quit date: 09/15/2014  . Smokeless tobacco: Never Used  . Alcohol Use: No  . Drug Use: No  . Sexual Activity: Not Currently   Other Topics Concern  . Not on file   Social History Narrative     Family History  Problem Relation Age of Onset  . Adopted: Yes     Review of Systems: All other systems reviewed and are otherwise negative except as noted above.  Physical Exam: Filed Vitals:   02/20/15 0900 02/20/15 1000 02/20/15 1100 02/20/15 1240  BP: 160/88 157/79 164/93   Pulse: 110 125 106   Temp:      TempSrc:      Resp: 31 30 25    Height:      Weight:      SpO2: 95% 94% 96% 96%  GEN- The patient is frail appearing, alert and oriented x 3 today.   HEENT: normocephalic, atraumatic; sclera clear, conjunctiva pink; hearing intact; oropharynx clear; neck  supple, no JVP Lungs- Clear to ausculation bilaterally, normal work of breathing.  No wheezes, rales, rhonchi Heart- irregular rate and rhythm  GI- soft, non-tender, non-distended, bowel sounds present, no hepatosplenomegaly Extremities- no clubbing, cyanosis, or edema; DP/PT/radial pulses 2+ bilaterally MS- diffuse muscle atrophy Skin- diffuse ecchymosis Psych- euthymic mood, full affect Neuro- strength and sensation are intact  Labs:   Lab Results  Component Value Date   WBC 9.8 02/20/2015   HGB 10.2* 02/20/2015   HCT 30.8* 02/20/2015   MCV 92.5 02/20/2015   PLT 30* 02/20/2015    Recent Labs Lab 02/18/15 0643  02/20/15 0225  NA 136  --  136  K 3.3*  < > 4.4  CL 106  --  107  CO2 23  --  22  BUN 18  --  15  CREATININE 0.73  --  0.55  CALCIUM 7.9*  --  8.5*  PROT 6.1*  --   --   BILITOT 1.8*  --   --   ALKPHOS 44  --   --   ALT 8*  --   --   AST 13*  --   --   GLUCOSE 104*  --  146*  < > = values in this interval not displayed.    Radiology/Studies: Dg Chest 2 View 02/18/2015   CLINICAL DATA:  Fever  EXAM: CHEST  2 VIEW  COMPARISON:  09/15/2014  FINDINGS: Chronic cardiomegaly. Stable mild aortic tortuosity when accounting for rotation. Chronic interstitial coarsening and hyperinflation, bronchitic and emphysematous disease based on 12/19/2013 chest CT. No superimposed consolidation. Small bilateral pleural effusions are present. Status post left breast and axillary surgery.  IMPRESSION: COPD and trace bilateral pleural effusion. No pneumonia to explain the history of fever.   Electronically Signed   By: Monte Fantasia M.D.   On: 02/18/2015 03:23   Ct Head Wo Contrast 02/18/2015   CLINICAL DATA:  Blurred vision, generalized weakness.  EXAM: CT HEAD WITHOUT CONTRAST  TECHNIQUE: Contiguous axial images were obtained from the base of the skull through the vertex without intravenous contrast.  COMPARISON:  CT scan of September 15, 2014.  FINDINGS: Bony calvarium appears intact. Mild  diffuse cortical atrophy is noted. Mild chronic ischemic white matter disease is noted. Old lacunar infarctions are noted in the right basal ganglia which are unchanged compared to prior exam. No mass effect or midline shift is noted. Ventricular size is within normal limits. There is no evidence of mass lesion, hemorrhage or acute infarction.  IMPRESSION: Mild diffuse cortical atrophy. Mild chronic ischemic white matter disease. No acute intracranial abnormality seen.   Electronically Signed   By: Marijo Conception, M.D.   On: 02/18/2015 08:45    EKG:02/19/15 7:25PM sinus brady, rate 59, normal intervals 02/19/15 6:53PM atypical atrial flutter, ventricular rate 169  TELEMETRY: atrial fibrillation with up to 5 second post termination pauses and sinus rhythm  Assessment/Plan: 1.  Atrial fibrillation/flutter The patient has newly identified atrial fibrillation and atrial flutter in the context of recent dental infection and extraction. She has had atrial fibrillation with RVR on telemetry with 5 second post termination pauses while on IV diltiazem.  There is concern about infection risks with MDS and recent dental infection.  Given platelet count, she is a poor candidate for pacing. For now, would hold rate controlling medications.  In the setting of underlying medical conditions, some degree of tachycardia is expected.  Follow fever curve and blood cultures over the weekend.  If she has recurrent pauses and blood cultures are negative with no recurrent fever, may need to consider pacemaker.  Her procedural risks are increased CHADS2VASC is at least 6 - not felt to be a candidate for anticoagulation due to thrombocytopenia  2.  MDS Per oncology  3.  Fever Continue antibiotics as per primary team Await final blood culture results  Dr Rayann Heman to see today.   Signed, Chanetta Marshall, NP 02/20/2015 1:21 PM  I have seen, examined the patient, and reviewed the above assessment and plan.  On exam, she is  pleasant but frail.  Changes to above are made where necessary.  She has multiple medical comorbidities and a very poor prognosis.  In the setting of a platelet count of 30k, she is not a candidate for any interventional procedures.  She has also had recent infections and fever.  Plans to transfer to [facility] are noted.  EP to follow there.  I think that a conservative approach is probably best.  As her pauses occurred on IV dilt and were not accompanied by symptoms, perhaps we could treat medically and avoid pacing.  Co Sign: Thompson Grayer, MD 02/20/2015 4:31 PM

## 2015-02-20 NOTE — Telephone Encounter (Signed)
Left message to confirm blood moved from 07/11 to 07/19 per pof.

## 2015-02-20 NOTE — Telephone Encounter (Signed)
Sent msg to add blood transfusion will contact pt with D/T once added... KJ

## 2015-02-20 NOTE — Progress Notes (Signed)
Tonya Mckee   DOB:1928-09-05   ZO#:[id number]   JWJ#:[id number]  Patient Care Team: Carlos Levering, PA-C as PCP - General (Family Medicine) Lennon Alstrom, MD (Neurology) Carlos Levering, PA-C as Referring Physician (Family Medicine) Heath Lark, MD as Consulting Physician (Hematology and Oncology)  I have seen the patient, examined her and edited the notes as follows  Subjective: Patient seen and examined. She is uncomfortable, agitated at the time of exam. She has persistent jaw pain. She was moved to ICU due to arrhythmia. Denies fevers, chills, night sweats, vision changes, or mucositis. She denies shortness of breath at rest, cough or sputum production. She has reported chest pain late evening, complicated with Afib and RVR; A fib is now controlled, but she is now tachycardic. Denies lower extremity swelling. Denies nausea, heartburn or change in bowel habits. Has not been able to eat due to above symptoms. Denies any dysuria. Denies abnormal skin rashes, or neuropathy. She reports intermittent headaches. Denies any bleeding issues such as epistaxis, hematemesis, hematuria or hematochezia.   Scheduled Meds: . chlorhexidine  15 mL Mouth Rinse BID  . [START ON 02/21/2015] digoxin  0.125 mg Intravenous Daily  . FLUoxetine  30 mg Oral q morning - 10a  . ipratropium-albuterol  3 mL Nebulization BID  . levofloxacin (LEVAQUIN) IV  500 mg Intravenous Q24H  . methylPREDNISolone (SOLU-MEDROL) injection  40 mg Intravenous Q12H  . metoprolol  5 mg Intravenous Once  . sodium chloride  3 mL Intravenous Q12H   Continuous Infusions: . sodium chloride 50 mL/hr at 02/20/15 0300   PRN Meds:.acetaminophen **OR** acetaminophen, acetaminophen-codeine, butalbital-acetaminophen-caffeine, hydrALAZINE, LORazepam, metoprolol, ondansetron **OR** ondansetron (ZOFRAN) IV, oxyCODONE  Objective:  Filed Vitals:   02/20/15 0500  BP: 179/99  Pulse: 114  Temp:   Resp: 30      Intake/Output Summary (Last 24  hours) at 02/20/15 6213 Last data filed at 02/20/15 0600  Gross per 24 hour  Intake    990 ml  Output   1875 ml  Net   -885 ml    GENERAL:alert,agitated this morning, and uncomfortable, weak appearing SKIN: skin color is pale, texture, turgor are normal, significant bruising noted EYES: normal, conjunctiva are pale and non-injected, sclera clear OROPHARYNX:no exudate, no erythema and lips, buccal mucosa, and tongue normal  NECK: supple, thyroid normal size, non-tender, without nodularity LUNGS: clear to auscultation and percussion with normal breathing effort HEART: tachy,regular rate & rhythm and no murmurs and no lower extremity edema ABDOMEN: soft, non-tender and normal bowel sounds Musculoskeletal:no cyanosis of digits and no clubbing  PSYCH: alert & oriented x 3 with fluent speech NEURO: no focal motor/sensory deficits    CBG (last 3)   Recent Labs  02/18/15 0021  GLUCAP 96     Labs:   Recent Labs Lab 02/18/15 0050 02/18/15 0058 02/18/15 0643 02/19/15 1330 02/20/15 0225  WBC 9.5  --  8.8 13.4* 9.8  HGB 8.9* 9.5* 7.6* 9.9* 10.2*  HCT 27.0* 28.0* 23.6* 29.8* 30.8*  PLT 27*  --  19* 29* 30*  MCV 96.4  --  95.9 92.0 92.5  MCH 31.8  --  30.9 30.6 30.6  MCHC 33.0  --  32.2 33.2 33.1  RDW 21.6*  --  21.6* 20.8* 20.8*  LYMPHSABS  --   --  2.6  --   --   MONOABS  --   --  2.7*  --   --   EOSABS  --   --  0.1  --   --  BASOSABS  --   --  0.0  --   --      Chemistries:    Recent Labs Lab 02/18/15 0050 02/18/15 0058 02/18/15 0643 02/19/15 1330 02/20/15 0225  NA 136 137 136  --  136  K 3.6 3.7 3.3* 4.0 4.4  CL 103 101 106  --  107  CO2 24  --  23  --  22  GLUCOSE 111* 111* 104*  --  146*  BUN 17 17 18   --  15  CREATININE 0.80 0.80 0.73  --  0.55  CALCIUM 8.3*  --  7.9*  --  8.5*  AST 16  --  13*  --   --   ALT 7*  --  8*  --   --   ALKPHOS 49  --  44  --   --   BILITOT 1.7*  --  1.8*  --   --     GFR Estimated Creatinine Clearance: 39.5 mL/min  (by C-G formula based on Cr of 0.55).  Liver Function Tests:  Recent Labs Lab 02/18/15 0050 02/18/15 0643  AST 16 13*  ALT 7* 8*  ALKPHOS 49 44  BILITOT 1.7* 1.8*  PROT 7.1 6.1*  ALBUMIN 3.0* 2.9*    Coagulation profile  Recent Labs Lab 02/18/15 0643  INR 1.23    Cardiac Enzymes:  Recent Labs Lab 02/18/15 0130 02/18/15 0145 02/19/15 1935 02/20/15 0225  CKTOTAL 19*  --   --   --   TROPONINI  --  0.03 <0.03 <0.03     Recent Labs Lab 02/18/15 0021  GLUCAP 41   Microbiology Cultures negative to date   Imaging Studies:  Ct Head Wo Contrast  02/18/2015   CLINICAL DATA:  Blurred vision, generalized weakness.  EXAM: CT HEAD WITHOUT CONTRAST  TECHNIQUE: Contiguous axial images were obtained from the base of the skull through the vertex without intravenous contrast.  COMPARISON:  CT scan of September 15, 2014.  FINDINGS: Bony calvarium appears intact. Mild diffuse cortical atrophy is noted. Mild chronic ischemic white matter disease is noted. Old lacunar infarctions are noted in the right basal ganglia which are unchanged compared to prior exam. No mass effect or midline shift is noted. Ventricular size is within normal limits. There is no evidence of mass lesion, hemorrhage or acute infarction.  IMPRESSION: Mild diffuse cortical atrophy. Mild chronic ischemic white matter disease. No acute intracranial abnormality seen.   Electronically Signed   By: Marijo Conception, M.D.   On: 02/18/2015 08:45    Assessment/Plan: 81 y.o.  Assessment & Plan:   MDS (myelodysplastic syndrome) with 5q deletion She has profound bone marrow suppression and pancytopenia due to recent dental infection. At present time, she will continue to hold Revlimid and remain on prednisone. We will continue transfusion support and blood work monitoring weekly.  Anemia in neoplastic disease This is likely anemia of chronic disease and related to her bone marrow disorder. The patient denies recent history  of bleeding such as epistaxis, hematuria or hematochezia. She will get 2 units of blood whenever hemoglobin dropped to less than 8 g  Thrombocytopenia due to drugs and recent infection This is likely due to recent infection and underlying disease.  The patient denies recent history of bleeding such as epistaxis, hematuria or hematochezia.  She is asymptomatic from the low platelet count. Will observe for now.  She will get platelet transfusion if it dropped to less than 10,000 or if she has signs  of bleeding  Atrial Fibrillation with RVR This was complicated with bradycardia and presyncope after Diltiazem IV was given, for which she was moved to stepdown. Currently, A fib has resolved and she is back to sinus rhythm, although remains tachycardic, perhaps exacerbated by pain. Appreciate primary team involvement.  Appreciate Cardiology evaluation  COPD exacerbation This is being managed with solumedrol, oxygen and nebs.  Dental infection Jaw pain She is currently receiving broad-spectrum IV anti-biotics Cultures are negative to date She is on oral oxyIR and tylenol Consider IV pain meds as needed for relief until symptoms improve.   Poor venous access She was originally scheduled for port placement on Monday. However, in view of recent infection, recommend deferring port placement and delayed by at least 1 week.  Profound weakness She is undergoing physical therapy evaluation and will likely be discharged to skilled nursing facility  Intermittent headaches CT scan was negative.  Will follow.Other medical issues as per admitting team   [facility], PA-C 02/20/2015  7:02 AM Nataliyah Packham, MD 02/20/2015

## 2015-02-20 NOTE — Progress Notes (Signed)
Pt has a small round bruise not quite the size of a penny to her sacral area from a fall at home along with other bruises to her back and multiple generalized bruises covering her extremities. No skin breakdown noted. Pt daughter stated that these bruises were all present before admission.

## 2015-02-20 NOTE — Consult Note (Addendum)
The patient was seen in conjunction with Kerin Ransom, PAC. She was interviewed, examined, and all data reviewed.  I discussed her long-term outlook with Dr. Alvy Bimler (Hematology).  Her MDS is treatable but has been interrupted by multiple different complications including bleeding and infection. She had multiple episodes of unexplained weakness and falls. No definite syncope but she has mild dementia.  She has Tachycardia Bradycardia syndrome characterized by AF with RVR and post conversion pauses up to 5 seconds. She would be best treated with pacer and medication for rythym control. She is not a candidate for anticoagulation due to thrombocytopenia. This would also complicate pacemaker placement. Additionally Dr. Alvy Bimler is concerned about predisposition to infection.   We recommend stopping meds that cause bradycardia, ie beta blocker. Transfer to [facility]. EP opinion.   This note will serve as the Attestation to the note provided by [place], [facility].

## 2015-02-21 ENCOUNTER — Other Ambulatory Visit: Payer: Self-pay

## 2015-02-21 LAB — CBC WITH DIFFERENTIAL/PLATELET
Basophils Absolute: 1 10*3/uL — ABNORMAL HIGH (ref 0.0–0.1)
Basophils Relative: 8 % — ABNORMAL HIGH (ref 0–1)
Eosinophils Absolute: 0 10*3/uL (ref 0.0–0.7)
Eosinophils Relative: 0 % (ref 0–5)
HEMATOCRIT: 31.2 % — AB (ref 36.0–46.0)
Hemoglobin: 10.6 g/dL — ABNORMAL LOW (ref 12.0–15.0)
Lymphocytes Relative: 18 % (ref 12–46)
Lymphs Abs: 2.4 10*3/uL (ref 0.7–4.0)
MCH: 31.3 pg (ref 26.0–34.0)
MCHC: 34 g/dL (ref 30.0–36.0)
MCV: 92 fL (ref 78.0–100.0)
METAMYELOCYTES PCT: 2 %
Monocytes Absolute: 0.5 10*3/uL (ref 0.1–1.0)
Monocytes Relative: 4 % (ref 3–12)
NEUTROS ABS: 9.2 10*3/uL — AB (ref 1.7–7.7)
Neutrophils Relative %: 68 % (ref 43–77)
Platelets: 32 10*3/uL — ABNORMAL LOW (ref 150–400)
RBC: 3.39 MIL/uL — ABNORMAL LOW (ref 3.87–5.11)
RDW: 20.6 % — AB (ref 11.5–15.5)
WBC: 13.1 10*3/uL — ABNORMAL HIGH (ref 4.0–10.5)

## 2015-02-21 LAB — COMPREHENSIVE METABOLIC PANEL
ALBUMIN: 2.6 g/dL — AB (ref 3.5–5.0)
ALT: 59 U/L — ABNORMAL HIGH (ref 14–54)
ANION GAP: 10 (ref 5–15)
AST: 53 U/L — ABNORMAL HIGH (ref 15–41)
Alkaline Phosphatase: 74 U/L (ref 38–126)
BUN: 25 mg/dL — AB (ref 6–20)
CO2: 21 mmol/L — ABNORMAL LOW (ref 22–32)
CREATININE: 0.74 mg/dL (ref 0.44–1.00)
Calcium: 8.6 mg/dL — ABNORMAL LOW (ref 8.9–10.3)
Chloride: 106 mmol/L (ref 101–111)
GFR calc Af Amer: >60 mL/min (ref >60)
Glucose, Bld: 144 mg/dL — ABNORMAL HIGH (ref 65–99)
POTASSIUM: 4.1 mmol/L (ref 3.5–5.1)
SODIUM: 137 mmol/L (ref 135–145)
Total Bilirubin: 2 mg/dL — ABNORMAL HIGH (ref 0.3–1.2)
Total Protein: 6.4 g/dL — ABNORMAL LOW (ref 6.5–8.1)

## 2015-02-21 LAB — MAGNESIUM: Magnesium: 1.9 mg/dL (ref 1.7–2.4)

## 2015-02-21 LAB — DIGOXIN LEVEL: DIGOXIN LVL: 0.6 ng/mL — AB (ref 0.8–2.0)

## 2015-02-21 MED ORDER — ALBUTEROL SULFATE (2.5 MG/3ML) 0.083% IN NEBU
2.5000 mg | INHALATION_SOLUTION | Freq: Once | RESPIRATORY_TRACT | Status: AC
  Administered 2015-02-21: 2.5 mg via RESPIRATORY_TRACT
  Filled 2015-02-21: qty 3

## 2015-02-21 MED ORDER — PREDNISONE 20 MG PO TABS
60.0000 mg | ORAL_TABLET | Freq: Every day | ORAL | Status: DC
  Administered 2015-02-22 – 2015-02-24 (×3): 60 mg via ORAL
  Filled 2015-02-21 (×3): qty 3

## 2015-02-21 MED ORDER — POLYETHYLENE GLYCOL 3350 17 G PO PACK
17.0000 g | PACK | Freq: Every day | ORAL | Status: DC
  Administered 2015-02-21 – 2015-02-24 (×3): 17 g via ORAL
  Filled 2015-02-21 (×3): qty 1

## 2015-02-21 NOTE — Evaluation (Signed)
Occupational Therapy Evaluation Patient Details Name: Tonya Mckee MRN: [id number] DOB: 28-May-1929 Today's Date: 02/21/2015    History of Present Illness 81 yo female admitted to [facility] with COPD exac. Transferred to [facility] with afib with RVR. hx of htn,COPD, CVA, vertigo, cancer. Pt is from home alone. On 2L 02 at home.    Clinical Impression   Pt was independent with assistive devices and living alone prior to admission.  She presents with generalized weakness, poor activity tolerance and impaired balance interfering with ability to perform at her baseline.  Pt with HR of 81 and 02 sats of 91% on 3L O2 with bed mobility and transfers. Will follow acutely.    Follow Up Recommendations  SNF;Supervision/Assistance - 24 hour    Equipment Recommendations       Recommendations for Other Services       Precautions / Restrictions Precautions Precautions: Fall Restrictions Weight Bearing Restrictions: No      Mobility Bed Mobility Overal bed mobility: Needs Assistance Bed Mobility: Supine to Sit     Supine to sit: Mod assist     General bed mobility comments: assist with pad to scoot to EOB and assist to raise trunk  Transfers Overall transfer level: Needs assistance Equipment used: 1 person hand held assist Transfers: Stand Pivot Transfers;Sit to/from Stand Sit to Stand: Min assist Stand pivot transfers: Min assist            Balance             Standing balance-Leahy Scale: Poor                              ADL Overall ADL's : Needs assistance/impaired Eating/Feeding: Independent;Sitting   Grooming: Wash/dry hands;Wash/dry face;Sitting;Set up   Upper Body Bathing: Minimal assitance;Sitting   Lower Body Bathing: Sit to/from stand;Maximal assistance   Upper Body Dressing : Minimal assistance;Sitting   Lower Body Dressing: Maximal assistance;Sit to/from stand   Toilet Transfer: Minimal assistance;Stand-pivot;BSC   Toileting- Clothing  Manipulation and Hygiene: Minimal assistance;Sit to/from stand         General ADL Comments: Pt significantly limited by poor activity tolerance.     Vision     Perception     Praxis      Pertinent Vitals/Pain Pain Assessment: 0-10 Pain Score: 8  Pain Location: head, chest Pain Descriptors / Indicators: Aching Pain Intervention(s): Limited activity within patient's tolerance;Monitored during session;Patient requesting pain meds-RN notified;Repositioned     Hand Dominance Right   Extremity/Trunk Assessment Upper Extremity Assessment Upper Extremity Assessment: Overall WFL for tasks assessed   Lower Extremity Assessment Lower Extremity Assessment: Defer to PT evaluation   Cervical / Trunk Assessment Cervical / Trunk Assessment: Kyphotic   Communication Communication Communication: HOH   Cognition Arousal/Alertness: Awake/alert Behavior During Therapy: WFL for tasks assessed/performed Overall Cognitive Status: Within Functional Limits for tasks assessed                     General Comments       Exercises       Shoulder Instructions      Home Living Family/patient expects to be discharged to:: Skilled nursing facility Living Arrangements: Alone                                      Prior Functioning/Environment Level of Independence: Independent with  assistive device(s)        Comments: using RW    OT Diagnosis: Generalized weakness;Acute pain   OT Problem List: Decreased strength;Decreased activity tolerance;Impaired balance (sitting and/or standing);Decreased knowledge of use of DME or AE;Cardiopulmonary status limiting activity;Pain   OT Treatment/Interventions: Self-care/ADL training;Energy conservation;DME and/or AE instruction;Therapeutic activities;Patient/family education;Balance training    OT Goals(Current goals can be found in the care plan section) Acute Rehab OT Goals Patient Stated Goal: breathe OT Goal  Formulation: With patient Time For Goal Achievement: 03/07/15 Potential to Achieve Goals: Good ADL Goals Pt Will Perform Grooming: with min guard assist;standing (1 activity) Pt Will Perform Upper Body Dressing: with set-up;sitting Pt Will Perform Lower Body Dressing: with set-up;sit to/from stand Pt Will Transfer to Toilet: with min guard assist;ambulating;regular height toilet Pt Will Perform Toileting - Clothing Manipulation and hygiene: with min guard assist;sit to/from stand Additional ADL Goal #1: Pt will utilize pursed lip breathing and pacing during ADL and mobility with minimal verbal cues.  OT Frequency: Min 2X/week   Barriers to D/C:            Co-evaluation              End of Session Equipment Utilized During Treatment: Oxygen (3L) Nurse Communication: Patient requests pain meds;Mobility status  Activity Tolerance: Patient limited by fatigue Patient left: in chair;with call bell/phone within reach;with chair alarm set   Time: 1426-1500 OT Time Calculation (min): 34 min Charges:  OT General Charges $OT Visit: 1 Procedure OT Evaluation $Initial OT Evaluation Tier I: 1 Procedure OT Treatments $Self Care/Home Management : 8-22 mins G-Codes:    Tonya Mckee 02/21/2015, 3:11 PM [phone]

## 2015-02-21 NOTE — Progress Notes (Signed)
[facility] PROGRESS NOTE  AARINI SLEE BDZ:[id number] DOB: 11-25-1928 DOA: 02/17/2015 TMH:[id number], PA-C   Patient ,? COPD, myelodysplastic syndrome  5 q deletion +pancytopenia, essential hypertension, chronic respiratory failure, chronic headache in setting recent dental surgery~ 2 wks PTA admit 02/18/15 c Fatigue and clinically a COPD exacerbation on admit She developed atrial fibrillation and beta blocker was continued patient developed pauses and cardiology subsequently consulted. Plan is to transition to [facility] for possible pacemaker placement  Assessment/Plan:  Atrial fibrillation with RVR +  sick sinus syndrome - Consulted cardiology and currently they are managing - not a good candidate for PPM per cardiology [recent infeciton/TCP] -medical management c Torpol 25 and Dig 0.125 -DIg level 7/9 given narrow tx index med    COPD exacerbation - on levaquin-d/c as Quinolones can prolong Qtc-Qtc today ~ 0.37 -on solumedrol -->Prednisone 60 daily 7/9 - supplemental oxygen, wean as tolerated. - continue duonebs   Active Problems:   Essential hypertension - blood pressures slightly high -Continue Toprol low dose only gvien significant COPD h/o -consider low dose CCD/HCTZ in am if persisting    MDS (myelodysplastic syndrome) with 5q deletion - transfusing if hgb less than 8.0. Repeat cbc pending.    Persistent headaches, ? analegesi rebound HA - May be secondary to patient not resting well. -has some occiptal tenderness and pain in neck - no focal neurological symptoms reported and CT scan of head reported no acute intracranial abnormality -will reassess again in am    Thrombocytopenia 2/2 MDS/Meds - will avoid heparin, scd's for dvt prophylaxis    General weakness - Consulted PT  Code Status: full Family Communication: discussed with son at bedside.  Disposition Plan: Pending improvement in  condition   Consultants:  none  Procedures:  none  Antibiotics:  Clindamycin>>>levaquin on 7/7   HPI/Subjective:  Fair HOH No distress tol some diet Still HA Some pain in neck No photophobia  Objective: Filed Vitals:   02/21/15 1133  BP: 146/67  Pulse: 84  Temp: 98.8 F (37.1 C)  Resp: 27    Intake/Output Summary (Last 24 hours) at 02/21/15 1636 Last data filed at 02/21/15 1347  Gross per 24 hour  Intake 1011.67 ml  Output    550 ml  Net 461.67 ml   Filed Weights   02/19/15 2000 02/20/15 1713 02/21/15 0400  Weight: 55.4 kg (122 lb 2.2 oz) 55.7 kg (122 lb 12.7 oz) 56.881 kg (125 lb 6.4 oz)    Exam:   General:  Pt in nad, alert and awake  Cardiovascular: irregularly irregular , no mrg  Respiratory: [place] in place, speaking in full sentences, equal chest rise, no wheezes  Abdomen: soft, nd, nt  Musculoskeletal: no cyanosis or clubbing   Data Reviewed: Basic Metabolic Panel:  Recent Labs Lab 02/18/15 0050 02/18/15 0058 02/18/15 0643 02/19/15 1330 02/20/15 0225 02/21/15 0813  NA 136 137 136  --  136 137  K 3.6 3.7 3.3* 4.0 4.4 4.1  CL 103 101 106  --  107 106  CO2 24  --  23  --  22 21*  GLUCOSE 111* 111* 104*  --  146* 144*  BUN 17 17 18   --  15 25*  CREATININE 0.80 0.80 0.73  --  0.55 0.74  CALCIUM 8.3*  --  7.9*  --  8.5* 8.6*  MG  --   --   --   --   --  1.9   Liver Function Tests:  Recent Labs Lab 02/18/15  0050 02/18/15 0643 02/21/15 0813  AST 16 13* 53*  ALT 7* 8* 59*  ALKPHOS 49 44 74  BILITOT 1.7* 1.8* 2.0*  PROT 7.1 6.1* 6.4*  ALBUMIN 3.0* 2.9* 2.6*   No results for input(s): LIPASE, AMYLASE in the last 168 hours. No results for input(s): AMMONIA in the last 168 hours. CBC:  Recent Labs Lab 02/18/15 0050 02/18/15 0058 02/18/15 0643 02/19/15 1330 02/20/15 0225 02/21/15 0813  WBC 9.5  --  8.8 13.4* 9.8 13.1*  NEUTROABS  --   --  3.4  --   --  9.2*  HGB 8.9* 9.5* 7.6* 9.9* 10.2* 10.6*  HCT 27.0* 28.0* 23.6*  29.8* 30.8* 31.2*  MCV 96.4  --  95.9 92.0 92.5 92.0  PLT 27*  --  19* 29* 30* 32*   Cardiac Enzymes:  Recent Labs Lab 02/18/15 0130 02/18/15 0145 02/19/15 1935 02/20/15 0225 02/20/15 0645  CKTOTAL 19*  --   --   --   --   TROPONINI  --  0.03 <0.03 <0.03 0.04*   BNP (last 3 results) No results for input(s): BNP in the last 8760 hours.  ProBNP (last 3 results) No results for input(s): PROBNP in the last 8760 hours.  CBG:  Recent Labs Lab 02/18/15 0021  GLUCAP 96    Recent Results (from the past 240 hour(s))  Blood culture (routine x 2)     Status: None (Preliminary result)   Collection Time: 02/18/15  2:41 AM  Result Value Ref Range Status   Specimen Description BLOOD BLOOD RIGHT FOREARM  Final   Special Requests BOTTLES DRAWN AEROBIC AND ANAEROBIC 5CC  Final   Culture   Final    NO GROWTH 3 DAYS Performed at [facility]    Report Status PENDING  Incomplete  Urine culture     Status: None   Collection Time: 02/18/15  2:43 AM  Result Value Ref Range Status   Specimen Description URINE, CLEAN CATCH  Final   Special Requests NONE  Final   Culture   Final    NO GROWTH 1 DAY Performed at [facility]    Report Status 02/19/2015 FINAL  Final  Blood culture (routine x 2)     Status: None (Preliminary result)   Collection Time: 02/18/15  2:46 AM  Result Value Ref Range Status   Specimen Description BLOOD RIGHT ANTECUBITAL  Final   Special Requests BOTTLES DRAWN AEROBIC AND ANAEROBIC 5CC  Final   Culture   Final    NO GROWTH 3 DAYS Performed at [facility]    Report Status PENDING  Incomplete  MRSA PCR Screening     Status: None   Collection Time: 02/19/15  8:16 PM  Result Value Ref Range Status   MRSA by PCR NEGATIVE NEGATIVE Final    Comment:        The GeneXpert MRSA Assay (FDA approved for NASAL specimens only), is one component of a comprehensive MRSA colonization surveillance program. It is not intended to diagnose  MRSA infection nor to guide or monitor treatment for MRSA infections.      Studies: No results found.  Scheduled Meds: . chlorhexidine  15 mL Mouth Rinse BID  . digoxin  0.125 mg Intravenous Daily  . FLUoxetine  30 mg Oral q morning - 10a  . ipratropium-albuterol  3 mL Nebulization BID  . metoprolol  5 mg Intravenous Once  . metoprolol succinate  50 mg Oral Daily  . polyethylene glycol  17  g Oral Daily  . predniSONE  60 mg Oral QAC breakfast  . sodium chloride  3 mL Intravenous Q12H   Continuous Infusions: . sodium chloride 50 mL/hr at 02/20/15 2210    Time spent: > 35 minutes  Verneita Griffes, MD [facility] (P) [phone]

## 2015-02-21 NOTE — Progress Notes (Signed)
Subjective:  Complained of shortness of breath earlier vague pain.  Temperature is down.  Objective:  Vital Signs in the last 24 hours: BP 146/67 mmHg  Pulse 84  Temp(Src) 98.8 F (37.1 C) (Oral)  Resp 27  Ht 5\' 1"  (1.549 m)  Wt 56.881 kg (125 lb 6.4 oz)  BMI 23.71 kg/m2  SpO2 91%  Physical Exam: Elderly female in no acute distress  Lungs:  Clear  Cardiac:  Regular rhythm, normal S1 and S2, no S3 Abdomen:  Soft, nontender, no masses Extremities:  No edema present  Intake/Output from previous day: 07/08 0701 - 07/09 0700 In: 651.7 [P.O.:240; I.V.:411.7] Out: 550 [Urine:550] Weight Filed Weights   02/19/15 2000 02/20/15 1713 02/21/15 0400  Weight: 55.4 kg (122 lb 2.2 oz) 55.7 kg (122 lb 12.7 oz) 56.881 kg (125 lb 6.4 oz)    Lab Results: Basic Metabolic Panel:  Recent Labs  02/20/15 0225 02/21/15 0813  NA 136 137  K 4.4 4.1  CL 107 106  CO2 22 21*  GLUCOSE 146* 144*  BUN 15 25*  CREATININE 0.55 0.74    CBC:  Recent Labs  02/20/15 0225 02/21/15 0813  WBC 9.8 13.1*  NEUTROABS  --  9.2*  HGB 10.2* 10.6*  HCT 30.8* 31.2*  MCV 92.5 92.0  PLT 30* 32*   Telemetry: Currently in sinus rhythm.  Did not see any pauses since last night.  Assessment/Plan:  1.  Paroxysmal atrial fibrillation currently maintaining sinus rhythm.  Mali of 5.4 termination pulse and I would try to stay away from diltiazem at this time.  Have not seen any significant bradycardia and at the present time would continue medical treatment and avoid pacemaking.  According to  EP note if she continues to have significant pauses and is infection free could consider pacemaking but at the present time with her thrombocytopenia and her recurrent infections is not a good candidate for pacemaker implantation.  Kerry Hough  MD [facility]  02/21/2015, 11:55 AM

## 2015-02-22 ENCOUNTER — Inpatient Hospital Stay (HOSPITAL_COMMUNITY): Payer: Medicare Other

## 2015-02-22 LAB — COMPREHENSIVE METABOLIC PANEL
ALT: 50 U/L (ref 14–54)
AST: 43 U/L — ABNORMAL HIGH (ref 15–41)
Albumin: 2.5 g/dL — ABNORMAL LOW (ref 3.5–5.0)
Alkaline Phosphatase: 69 U/L (ref 38–126)
Anion gap: 8 (ref 5–15)
BUN: 25 mg/dL — ABNORMAL HIGH (ref 6–20)
CO2: 23 mmol/L (ref 22–32)
Calcium: 8.5 mg/dL — ABNORMAL LOW (ref 8.9–10.3)
Chloride: 107 mmol/L (ref 101–111)
Creatinine, Ser: 0.7 mg/dL (ref 0.44–1.00)
GFR calc Af Amer: >60 mL/min (ref >60)
GFR calc non Af Amer: >60 mL/min (ref >60)
Glucose, Bld: 98 mg/dL (ref 65–99)
Potassium: 4.8 mmol/L (ref 3.5–5.1)
Sodium: 138 mmol/L (ref 135–145)
Total Bilirubin: 2.1 mg/dL — ABNORMAL HIGH (ref 0.3–1.2)
Total Protein: 6.1 g/dL — ABNORMAL LOW (ref 6.5–8.1)

## 2015-02-22 LAB — BASIC METABOLIC PANEL
Anion gap: 9 (ref 5–15)
BUN: 21 mg/dL — ABNORMAL HIGH (ref 6–20)
CO2: 24 mmol/L (ref 22–32)
Calcium: 8.2 mg/dL — ABNORMAL LOW (ref 8.9–10.3)
Chloride: 104 mmol/L (ref 101–111)
Creatinine, Ser: 0.62 mg/dL (ref 0.44–1.00)
GFR calc Af Amer: >60 mL/min (ref >60)
Glucose, Bld: 111 mg/dL — ABNORMAL HIGH (ref 65–99)
Potassium: 3.5 mmol/L (ref 3.5–5.1)
SODIUM: 137 mmol/L (ref 135–145)

## 2015-02-22 LAB — CBC
HCT: 27.5 % — ABNORMAL LOW (ref 36.0–46.0)
Hemoglobin: 9.1 g/dL — ABNORMAL LOW (ref 12.0–15.0)
MCH: 31.1 pg (ref 26.0–34.0)
MCHC: 33.1 g/dL (ref 30.0–36.0)
MCV: 93.9 fL (ref 78.0–100.0)
PLATELETS: 23 10*3/uL — AB (ref 150–400)
RBC: 2.93 MIL/uL — AB (ref 3.87–5.11)
RDW: 20.5 % — AB (ref 11.5–15.5)
WBC: 13 10*3/uL — ABNORMAL HIGH (ref 4.0–10.5)

## 2015-02-22 MED ORDER — AMLODIPINE BESYLATE 5 MG PO TABS
5.0000 mg | ORAL_TABLET | Freq: Every day | ORAL | Status: DC
  Administered 2015-02-22 – 2015-02-24 (×3): 5 mg via ORAL
  Filled 2015-02-22 (×3): qty 1

## 2015-02-22 MED ORDER — DOXYCYCLINE HYCLATE 100 MG PO TABS
100.0000 mg | ORAL_TABLET | Freq: Two times a day (BID) | ORAL | Status: DC
  Administered 2015-02-22 – 2015-02-23 (×3): 100 mg via ORAL
  Filled 2015-02-22 (×3): qty 1

## 2015-02-22 MED ORDER — LIDOCAINE 5 % EX PTCH
1.0000 | MEDICATED_PATCH | CUTANEOUS | Status: DC
  Administered 2015-02-22 – 2015-02-24 (×3): 1 via TRANSDERMAL
  Filled 2015-02-22 (×3): qty 1

## 2015-02-22 MED ORDER — ALBUTEROL SULFATE (2.5 MG/3ML) 0.083% IN NEBU
2.5000 mg | INHALATION_SOLUTION | RESPIRATORY_TRACT | Status: DC | PRN
  Administered 2015-02-22 – 2015-02-24 (×2): 2.5 mg via RESPIRATORY_TRACT
  Filled 2015-02-22: qty 3

## 2015-02-22 MED ORDER — SORBITOL 70 % SOLN: 30.0000 mL | Freq: Every day | Status: DC | PRN

## 2015-02-22 MED ORDER — ALBUTEROL SULFATE (2.5 MG/3ML) 0.083% IN NEBU
INHALATION_SOLUTION | RESPIRATORY_TRACT | Status: AC
  Filled 2015-02-22: qty 3

## 2015-02-22 NOTE — Progress Notes (Signed)
After returning from [facility] pt tachypneic 35-40 . Sats 88-90%. Bilateral breath sounds with bibasilar rales and wheezing.Pt medicated for c/o headache

## 2015-02-22 NOTE — Progress Notes (Signed)
Pt resting more comfortably. B/P & RR decreased. Sats remain 90-92% on 4L.

## 2015-02-22 NOTE — Progress Notes (Signed)
Respiratory therapist in to administer treatment as ordered. Ativan 0.5mg  administered PO for anxiety.

## 2015-02-22 NOTE — Progress Notes (Signed)
Pt assisted OOB to utilize [facility]. + DOE noted. RR 34-38 O2 sats down to 87%. Pt assisted back to bed. Nasal O2 increased to 3L. O2 sats slowly returning to 93%.

## 2015-02-22 NOTE — Progress Notes (Signed)
CRITICAL VALUE ALERT  Critical value received:  Platelets  Date of notification:  02/22/15  Time of notification:  2703  Critical value read back:Yes.    Nurse who received alert:  Donnella Bi  MD notified (1st page):  Samtani  Time of first page:  1052  Responding MD:  Verlon Au  Time MD responded:  [phone]

## 2015-02-22 NOTE — Progress Notes (Signed)
Subjective:  Multiple complaints this morning.  Complains of headache, abdominal pain, shortness of breath.  No recurrent pauses or bradycardia overnight.  Objective:  Vital Signs in the last 24 hours: BP 152/65 mmHg  Pulse 87  Temp(Src) 97.8 F (36.6 C) (Oral)  Resp 35  Ht 5\' 1"  (1.549 m)  Wt 56.881 kg (125 lb 6.4 oz)  BMI 23.71 kg/m2  SpO2 91%  Physical Exam: Elderly female currently complaining of headache.  Mildly tachypnea At rest.   Lungs:  Clear  Cardiac:  Regular rhythm, normal S1 and S2, no S3 Extremities:  No edema present  Intake/Output from previous day: 07/09 0701 - 07/10 0700 In: 363 [P.O.:360; I.V.:3] Out: 725 [Urine:725] Weight Filed Weights   02/19/15 2000 02/20/15 1713 02/21/15 0400  Weight: 55.4 kg (122 lb 2.2 oz) 55.7 kg (122 lb 12.7 oz) 56.881 kg (125 lb 6.4 oz)    Lab Results: Basic Metabolic Panel:  Recent Labs  02/21/15 0813 02/22/15 0339  NA 137 138  K 4.1 4.8  CL 106 107  CO2 21* 23  GLUCOSE 144* 98  BUN 25* 25*  CREATININE 0.74 0.70    CBC:  Recent Labs  02/20/15 0225 02/21/15 0813  WBC 9.8 13.1*  NEUTROABS  --  9.2*  HGB 10.2* 10.6*  HCT 30.8* 31.2*  MCV 92.5 92.0  PLT 30* 32*   Telemetry: Currently in sinus rhythm.  Did not see any pauses since last night.  Assessment/Plan:  1.  Paroxysmal atrial fibrillation currently maintaining sinus rhythm.  Mali of 5.4 termination pulse and I would try to stay away from diltiazem at this time.  Have not seen any significant bradycardia and at the present time would continue medical treatment and avoid pacemaking.  According to  EP note if she continues to have significant pauses and is infection free could consider pacemaking but at the present time with her thrombocytopenia and her recurrent infections is not a good candidate for pacemaker implantation.  I spoke with the hospitalist.  She has a host of other noncardiac problems.  With no recurrent bradycardia I would recommend that  she continue her medical treatment.  It's okay for her to be on dig as well as her metoprolol at this time.  Call if you need [place] to see her.  Kerry Hough  MD [facility]  02/22/2015, 8:59 AM

## 2015-02-22 NOTE — Progress Notes (Signed)
[facility] PROGRESS NOTE  Tonya Mckee WCH:[id number] DOB: 1929-05-23 DOA: 02/17/2015 PNT:[id number], PA-C   Patient ,? COPD, myelodysplastic syndrome  5 q deletion +pancytopenia, essential hypertension, chronic respiratory failure since 2015, chronic headache in setting recent dental surgery~ 2 wks PTA admit 02/18/15 c Fatigue and clinically a COPD exacerbation on admit She developed atrial fibrillation and beta blocker was continued patient developed pauses and cardiology subsequently consulted. Plan is to transition to [facility] for possible pacemaker placement  Assessment/Plan:  Atrial fibrillation with RVR +  sick sinus syndrome - not a good candidate for PPM per cardiology [recent infection/TCP] -medical management c Torpol 25 and Dig 0.125 -no other options at present and will have to tolerate some element of tachycardia as pauses are more life threatenting -likely will need goals of care    Stage 3-4 severe COPD with acute exacerbation - on levaquin-d/c as Quinolones can prolong Qtc-Qtc today ~ 0.37 - on solumedrol -->Prednisone 60 daily 7/9 - supplemental oxygen-desats with anxiety - continue duonebs  -Repeat chest x-ray 7/10    Essential hypertension - blood pressures slightly high -Continue Toprol low dose only gvien significant COPD h/o -consider low dose CCD/HCTZ in am if persisting    MDS (myelodysplastic syndrome) with 5q deletion - transfusing if hgb less than 8.0. Repeat cbcstable. - poor candidate for indwelling line and discussed with family     Persistent headaches, ? analgesic rebound HA - has had HA's "all her life" -has some occiptal tenderness and pain in neck -Will add Lidoderm patch - no focal neurological symptoms reported and CT scan of head reported no acute intracranial abnormality -will reassess again in am    Thrombocytopenia 2/2 MDS/Meds - will avoid heparin, scd's for dvt prophylaxis    General weakness - Consulted PT  Code  Status: full Family Communication: discussed with daughter in detail on telephone I recommend goals of care and decision as per Patient and family Disposition Plan: Pending improvement in condition   Consultants:  none  Procedures:  none  Antibiotics:  Clindamycin>>>levaquin on 7/7 --EC 7/9  HPI/Subjective:  Tired of being stuck in her arm Anxious a little bit Still has had no stool No chest pain Still very short of breath  Objective: Filed Vitals:   02/22/15 0421  BP: 152/65  Pulse: 87  Temp:   Resp: 35    Intake/Output Summary (Last 24 hours) at 02/22/15 1033 Last data filed at 02/22/15 0830  Gross per 24 hour  Intake    243 ml  Output   1125 ml  Net   -882 ml   Filed Weights   02/19/15 2000 02/20/15 1713 02/21/15 0400  Weight: 55.4 kg (122 lb 2.2 oz) 55.7 kg (122 lb 12.7 oz) 56.881 kg (125 lb 6.4 oz)    Exam:   General:  Pt in nad, alert and awake  Cardiovascular: irregularly irregular , no mrg  Respiratory: [place] in place, speaking in full sentences, equal chest rise, no wheezes  Abdomen: soft, nd, nt  Musculoskeletal: no cyanosis or clubbing   Data Reviewed: Basic Metabolic Panel:  Recent Labs Lab 02/18/15 0050 02/18/15 0058 02/18/15 0643 02/19/15 1330 02/20/15 0225 02/21/15 0813 02/22/15 0339  NA 136 137 136  --  136 137 138  K 3.6 3.7 3.3* 4.0 4.4 4.1 4.8  CL 103 101 106  --  107 106 107  CO2 24  --  23  --  22 21* 23  GLUCOSE 111* 111* 104*  --  146*  144* 98  BUN 17 17 18   --  15 25* 25*  CREATININE 0.80 0.80 0.73  --  0.55 0.74 0.70  CALCIUM 8.3*  --  7.9*  --  8.5* 8.6* 8.5*  MG  --   --   --   --   --  1.9  --    Liver Function Tests:  Recent Labs Lab 02/18/15 0050 02/18/15 0643 02/21/15 0813 02/22/15 0339  AST 16 13* 53* 43*  ALT 7* 8* 59* 50  ALKPHOS 49 44 74 69  BILITOT 1.7* 1.8* 2.0* 2.1*  PROT 7.1 6.1* 6.4* 6.1*  ALBUMIN 3.0* 2.9* 2.6* 2.5*   No results for input(s): LIPASE, AMYLASE in the last 168  hours. No results for input(s): AMMONIA in the last 168 hours. CBC:  Recent Labs Lab 02/18/15 0050 02/18/15 0058 02/18/15 0643 02/19/15 1330 02/20/15 0225 02/21/15 0813  WBC 9.5  --  8.8 13.4* 9.8 13.1*  NEUTROABS  --   --  3.4  --   --  9.2*  HGB 8.9* 9.5* 7.6* 9.9* 10.2* 10.6*  HCT 27.0* 28.0* 23.6* 29.8* 30.8* 31.2*  MCV 96.4  --  95.9 92.0 92.5 92.0  PLT 27*  --  19* 29* 30* 32*   Cardiac Enzymes:  Recent Labs Lab 02/18/15 0130 02/18/15 0145 02/19/15 1935 02/20/15 0225 02/20/15 0645  CKTOTAL 19*  --   --   --   --   TROPONINI  --  0.03 <0.03 <0.03 0.04*   BNP (last 3 results) No results for input(s): BNP in the last 8760 hours.  ProBNP (last 3 results) No results for input(s): PROBNP in the last 8760 hours.  CBG:  Recent Labs Lab 02/18/15 0021  GLUCAP 96    Recent Results (from the past 240 hour(s))  Blood culture (routine x 2)     Status: None (Preliminary result)   Collection Time: 02/18/15  2:41 AM  Result Value Ref Range Status   Specimen Description BLOOD BLOOD RIGHT FOREARM  Final   Special Requests BOTTLES DRAWN AEROBIC AND ANAEROBIC 5CC  Final   Culture   Final    NO GROWTH 3 DAYS Performed at [facility]    Report Status PENDING  Incomplete  Urine culture     Status: None   Collection Time: 02/18/15  2:43 AM  Result Value Ref Range Status   Specimen Description URINE, CLEAN CATCH  Final   Special Requests NONE  Final   Culture   Final    NO GROWTH 1 DAY Performed at [facility]    Report Status 02/19/2015 FINAL  Final  Blood culture (routine x 2)     Status: None (Preliminary result)   Collection Time: 02/18/15  2:46 AM  Result Value Ref Range Status   Specimen Description BLOOD RIGHT ANTECUBITAL  Final   Special Requests BOTTLES DRAWN AEROBIC AND ANAEROBIC 5CC  Final   Culture   Final    NO GROWTH 3 DAYS Performed at [facility]    Report Status PENDING  Incomplete  MRSA PCR Screening     Status: None    Collection Time: 02/19/15  8:16 PM  Result Value Ref Range Status   MRSA by PCR NEGATIVE NEGATIVE Final    Comment:        The GeneXpert MRSA Assay (FDA approved for NASAL specimens only), is one component of a comprehensive MRSA colonization surveillance program. It is not intended to diagnose MRSA infection nor to guide  or monitor treatment for MRSA infections.      Studies: No results found.  Scheduled Meds: . chlorhexidine  15 mL Mouth Rinse BID  . digoxin  0.125 mg Intravenous Daily  . FLUoxetine  30 mg Oral q morning - 10a  . ipratropium-albuterol  3 mL Nebulization BID  . metoprolol  5 mg Intravenous Once  . metoprolol succinate  50 mg Oral Daily  . polyethylene glycol  17 g Oral Daily  . predniSONE  60 mg Oral QAC breakfast  . sodium chloride  3 mL Intravenous Q12H   Continuous Infusions: . sodium chloride 50 mL/hr at 02/20/15 2210    Time spent: > 35 minutes  Verneita Griffes, MD [facility] (P) [phone]

## 2015-02-22 NOTE — Progress Notes (Signed)
Patient converted back to a fib with a heart rate in 140's. 5 of metoprolol was given and MD paged. He ordered an additional 5mg . Patient is resting and will continue to monitor.

## 2015-02-23 ENCOUNTER — Ambulatory Visit (HOSPITAL_COMMUNITY): Admission: RE | Admit: 2015-02-23 | Payer: Medicare Other | Source: Ambulatory Visit

## 2015-02-23 ENCOUNTER — Inpatient Hospital Stay (HOSPITAL_COMMUNITY): Admission: RE | Admit: 2015-02-23 | Payer: Medicare Other | Source: Ambulatory Visit

## 2015-02-23 LAB — COMPREHENSIVE METABOLIC PANEL
ALBUMIN: 2.3 g/dL — AB (ref 3.5–5.0)
ALK PHOS: 62 U/L (ref 38–126)
ALT: 34 U/L (ref 14–54)
AST: 24 U/L (ref 15–41)
Anion gap: 8 (ref 5–15)
BUN: 24 mg/dL — AB (ref 6–20)
CHLORIDE: 103 mmol/L (ref 101–111)
CO2: 25 mmol/L (ref 22–32)
Calcium: 8 mg/dL — ABNORMAL LOW (ref 8.9–10.3)
Creatinine, Ser: 0.65 mg/dL (ref 0.44–1.00)
GFR calc non Af Amer: >60 mL/min (ref >60)
Glucose, Bld: 113 mg/dL — ABNORMAL HIGH (ref 65–99)
Potassium: 3.8 mmol/L (ref 3.5–5.1)
Sodium: 136 mmol/L (ref 135–145)
Total Bilirubin: 2.1 mg/dL — ABNORMAL HIGH (ref 0.3–1.2)
Total Protein: 6.4 g/dL — ABNORMAL LOW (ref 6.5–8.1)

## 2015-02-23 LAB — CBC WITH DIFFERENTIAL/PLATELET
BASOS PCT: 0 % (ref 0–1)
Basophils Absolute: 0 10*3/uL (ref 0.0–0.1)
Eosinophils Absolute: 0.1 10*3/uL (ref 0.0–0.7)
Eosinophils Relative: 1 % (ref 0–5)
HEMATOCRIT: 28.9 % — AB (ref 36.0–46.0)
Hemoglobin: 9.4 g/dL — ABNORMAL LOW (ref 12.0–15.0)
Lymphocytes Relative: 20 % (ref 12–46)
Lymphs Abs: 2.5 10*3/uL (ref 0.7–4.0)
MCH: 30.5 pg (ref 26.0–34.0)
MCHC: 32.5 g/dL (ref 30.0–36.0)
MCV: 93.8 fL (ref 78.0–100.0)
MONOS PCT: 34 % — AB (ref 3–12)
Monocytes Absolute: 4.3 10*3/uL — ABNORMAL HIGH (ref 0.1–1.0)
NEUTROS ABS: 5.6 10*3/uL (ref 1.7–7.7)
NEUTROS PCT: 45 % (ref 43–77)
Platelets: 24 10*3/uL — CL (ref 150–400)
RBC: 3.08 MIL/uL — AB (ref 3.87–5.11)
RDW: 20.4 % — ABNORMAL HIGH (ref 11.5–15.5)
WBC: 12.5 10*3/uL — AB (ref 4.0–10.5)

## 2015-02-23 LAB — CULTURE, BLOOD (ROUTINE X 2)
Culture: NO GROWTH
Culture: NO GROWTH

## 2015-02-23 LAB — PATHOLOGIST SMEAR REVIEW

## 2015-02-23 MED ORDER — LORAZEPAM 0.5 MG PO TABS
0.2500 mg | ORAL_TABLET | Freq: Two times a day (BID) | ORAL | Status: DC | PRN
  Administered 2015-02-24: 0.25 mg via ORAL
  Filled 2015-02-23: qty 1

## 2015-02-23 MED ORDER — CLINDAMYCIN HCL 300 MG PO CAPS
300.0000 mg | ORAL_CAPSULE | Freq: Four times a day (QID) | ORAL | Status: DC
  Administered 2015-02-23 – 2015-02-24 (×3): 300 mg via ORAL
  Filled 2015-02-23 (×7): qty 1

## 2015-02-23 MED ORDER — LORAZEPAM 2 MG/ML IJ SOLN: 0.2500 mg | Freq: Once | INTRAMUSCULAR | Status: DC

## 2015-02-23 NOTE — Progress Notes (Signed)
[facility] PROGRESS NOTE  Tonya Mckee MIW:[id number] DOB: 26-Apr-1929 DOA: 02/17/2015 GNO:[id number], PA-C   Patient ,? COPD, myelodysplastic syndrome  5 q deletion +pancytopenia, essential hypertension, chronic respiratory failure since 2015, chronic headache in setting recent dental surgery~ 2 wks PTA admit 02/18/15 c Fatigue and clinically a COPD exacerbation on admit She developed atrial fibrillation and beta blocker was continued patient developed pauses and cardiology subsequently consulted. Plan is to transition to [facility] for possible pacemaker placement  Assessment/Plan:  Atrial fibrillation with RVR +  sick sinus syndrome - not a good candidate for PPM per cardiology [recent infection/TCP] -medical management c Torpol 25 and Dig 0.125 -no other options at present and will have to tolerate some element of tachycardia as pauses are more life threatenting -likely will need goals of care as an outpatient    Stage 3-4 severe COPD with acute exacerbation - on levaquin-d/c as Quinolones can prolong Qtc-Qtc today ~ 0.37 -started doxy which was transitioned to [place] on 7/11 - on solumedrol -->Prednisone 60 daily 7/9 - supplemental oxygen-desats with anxiety and we will continue either Ativan 0.25 twice a day or potentially add Roxanol as an outpatient - continue duonebs cautiously given Beta agonist effect    Essential hypertension - blood pressures slightly high -Continue Toprol low dose only gvien significant COPD h/o -added amlodipine 5 mg 02/22/15    MDS (myelodysplastic syndrome) with 5q deletion - transfusing if hgb less than 8.0. Repeat cbc stable. -bili slightly elevated probably from MDS -Dr. Alvy Bimler to determine needs for PORT placement and ongoing chemo c Revlimid -would not taper steroids for this indication nor for COPD rapidly--would address as OP at Oncology follow up -appreciate input -poor candidate for indwelling lines given risk for sepsis and  discussed with family     Persistent headaches, ? analgesic rebound HA - has had HA's "all her life" -has some occiptal tenderness and pain in neck - no focal neurological symptoms reported and CT scan of head reported no acute intracranial abnormality    Thrombocytopenia 2/2 MDS/Meds and superimposed dental infection - will avoid heparin, scd's for dvt prophylaxis -Smear shows normocytic anemia -cover dental infection with PO clinda.  D/c doxycycline 7/11    General weakness - Consulted PT  Code Status: full Family Communication: discussed with daughter in detail on telephone I recommend goals of care as OP Disposition Plan:likely SNf ~ 24 hrs   Consultants:  none  Procedures:  none  Antibiotics:  Clindamycin>>>levaquin on 7/7 --EC 7/9  HPI/Subjective:  large BM today No blood Wants ot leave No cp anxious with tachyarrythmia Daughter with multiple q's at bedside  Objective: Filed Vitals:   02/23/15 1124  BP: 141/78  Pulse: 104  Temp: 98.8 F (37.1 C)  Resp:     Intake/Output Summary (Last 24 hours) at 02/23/15 1511 Last data filed at 02/23/15 1329  Gross per 24 hour  Intake    553 ml  Output    850 ml  Net   -297 ml   Filed Weights   02/20/15 1713 02/21/15 0400 02/23/15 0500  Weight: 55.7 kg (122 lb 12.7 oz) 56.881 kg (125 lb 6.4 oz) 56.1 kg (123 lb 10.9 oz)    Exam:   General:  Pt in nad, alert and awake-anxious  Cardiovascular: irregularly irregular , no mrg  Respiratory: [place] in place, speaking in full sentences, equal chest rise, no wheezes  Abdomen: soft, nd, nt  Musculoskeletal: no cyanosis or clubbing   Data Reviewed: Basic Metabolic  Panel:  Recent Labs Lab 02/20/15 0225 02/21/15 0813 02/22/15 0339 02/22/15 1015 02/23/15 0249  NA 136 137 138 137 136  K 4.4 4.1 4.8 3.5 3.8  CL 107 106 107 104 103  CO2 22 21* 23 24 25   GLUCOSE 146* 144* 98 111* 113*  BUN 15 25* 25* 21* 24*  CREATININE 0.55 0.74 0.70 0.62 0.65  CALCIUM  8.5* 8.6* 8.5* 8.2* 8.0*  MG  --  1.9  --   --   --    Liver Function Tests:  Recent Labs Lab 02/18/15 0050 02/18/15 0643 02/21/15 0813 02/22/15 0339 02/23/15 0249  AST 16 13* 53* 43* 24  ALT 7* 8* 59* 50 34  ALKPHOS 49 44 74 69 62  BILITOT 1.7* 1.8* 2.0* 2.1* 2.1*  PROT 7.1 6.1* 6.4* 6.1* 6.4*  ALBUMIN 3.0* 2.9* 2.6* 2.5* 2.3*   No results for input(s): LIPASE, AMYLASE in the last 168 hours. No results for input(s): AMMONIA in the last 168 hours. CBC:  Recent Labs Lab 02/18/15 0643 02/19/15 1330 02/20/15 0225 02/21/15 0813 02/22/15 1015 02/23/15 0249  WBC 8.8 13.4* 9.8 13.1* 13.0* 12.5*  NEUTROABS 3.4  --   --  9.2*  --  5.6  HGB 7.6* 9.9* 10.2* 10.6* 9.1* 9.4*  HCT 23.6* 29.8* 30.8* 31.2* 27.5* 28.9*  MCV 95.9 92.0 92.5 92.0 93.9 93.8  PLT 19* 29* 30* 32* 23* 24*   Cardiac Enzymes:  Recent Labs Lab 02/18/15 0130 02/18/15 0145 02/19/15 1935 02/20/15 0225 02/20/15 0645  CKTOTAL 19*  --   --   --   --   TROPONINI  --  0.03 <0.03 <0.03 0.04*   BNP (last 3 results) No results for input(s): BNP in the last 8760 hours.  ProBNP (last 3 results) No results for input(s): PROBNP in the last 8760 hours.  CBG:  Recent Labs Lab 02/18/15 0021  GLUCAP 96    Recent Results (from the past 240 hour(s))  Blood culture (routine x 2)     Status: None   Collection Time: 02/18/15  2:41 AM  Result Value Ref Range Status   Specimen Description BLOOD BLOOD RIGHT FOREARM  Final   Special Requests BOTTLES DRAWN AEROBIC AND ANAEROBIC 5CC  Final   Culture   Final    NO GROWTH 5 DAYS Performed at [facility]    Report Status 02/23/2015 FINAL  Final  Urine culture     Status: None   Collection Time: 02/18/15  2:43 AM  Result Value Ref Range Status   Specimen Description URINE, CLEAN CATCH  Final   Special Requests NONE  Final   Culture   Final    NO GROWTH 1 DAY Performed at [facility]    Report Status 02/19/2015 FINAL  Final  Blood culture  (routine x 2)     Status: None   Collection Time: 02/18/15  2:46 AM  Result Value Ref Range Status   Specimen Description BLOOD RIGHT ANTECUBITAL  Final   Special Requests BOTTLES DRAWN AEROBIC AND ANAEROBIC 5CC  Final   Culture   Final    NO GROWTH 5 DAYS Performed at [facility]    Report Status 02/23/2015 FINAL  Final  MRSA PCR Screening     Status: None   Collection Time: 02/19/15  8:16 PM  Result Value Ref Range Status   MRSA by PCR NEGATIVE NEGATIVE Final    Comment:        The GeneXpert MRSA Assay (FDA  approved for NASAL specimens only), is one component of a comprehensive MRSA colonization surveillance program. It is not intended to diagnose MRSA infection nor to guide or monitor treatment for MRSA infections.      Studies: Dg Chest 2 View  02/22/2015   CLINICAL DATA:  Acute on chronic congestive heart failure. COPD. Myelodysplastic syndrome.  EXAM: CHEST  2 VIEW  COMPARISON:  02/18/2015  FINDINGS: Moderate cardiomegaly stable. Increased diffuse interstitial prominence is suspicious for mild interstitial edema. New small bilateral pleural effusions also seen. Pulmonary hyperinflation again demonstrated, consistent with COPD. Surgical clips again seen in the left axilla.  IMPRESSION: Increased diffuse interstitial infiltrates/edema and small bilateral pleural effusions, suspicious for congestive heart failure.  Stable cardiomegaly and COPD.   Electronically Signed   By: Earle Gell M.D.   On: 02/22/2015 14:47    Scheduled Meds: . amLODipine  5 mg Oral Daily  . chlorhexidine  15 mL Mouth Rinse BID  . digoxin  0.125 mg Intravenous Daily  . doxycycline  100 mg Oral Q12H  . FLUoxetine  30 mg Oral q morning - 10a  . ipratropium-albuterol  3 mL Nebulization BID  . lidocaine  1 patch Transdermal Q24H  . metoprolol succinate  50 mg Oral Daily  . polyethylene glycol  17 g Oral Daily  . predniSONE  60 mg Oral QAC breakfast  . sodium chloride  3 mL Intravenous Q12H    Continuous Infusions:    Time spent:25 minutes  Verneita Griffes, MD [facility] [phone]

## 2015-02-23 NOTE — Progress Notes (Signed)
Pt transferred from [facility] ( [phone] ) to [facility] CSW assisting with SNF placement.   Werner Lean LCSW [phone]

## 2015-02-23 NOTE — Progress Notes (Signed)
CSW Armed forces technical officer) spoke with pt daughter and provided with bed offers. Pt daughter to review options and notify CSW of decision.   [place], [place]

## 2015-02-23 NOTE — Clinical Social Work Placement (Signed)
   CLINICAL SOCIAL WORK PLACEMENT  NOTE  Date:  02/23/2015  Patient Details  Name: Tonya Mckee MRN: [id number] Date of Birth: 10/12/1928  Clinical Social Work is seeking post-discharge placement for this patient at the [place] level of care (*CSW will initial, date and re-position this form in  chart as items are completed):  Yes   Patient/family provided with [place] Work Department's list of facilities offering this level of care within the geographic area requested by the patient (or if unable, by the patient's family).  Yes   Patient/family informed of their freedom to choose among providers that offer the needed level of care, that participate in Medicare, Medicaid or managed care program needed by the patient, have an available bed and are willing to accept the patient.  Yes   Patient/family informed of [facility]'s ownership interest in [facility] and [facility], as well as of the fact that they are under no obligation to receive care at these facilities.  PASRR submitted to EDS on 02/18/15     PASRR number received on 02/18/15     Existing PASRR number confirmed on       FL2 transmitted to all facilities in geographic area requested by pt/family on 02/18/15     FL2 transmitted to all facilities within larger geographic area on       Patient informed that his/her managed care company has contracts with or will negotiate with certain facilities, including the following:        Yes   Patient/family informed of bed offers received.  Patient chooses bed at [facility]     Physician recommends and patient chooses bed at      Patient to be transferred to   on  .  Patient to be transferred to facility by       Patient family notified on   of transfer.  Name of family member notified:        PHYSICIAN Please prepare priority discharge summary, including medications, Please prepare prescriptions, Please sign FL2      Additional Comment:    _______________________________________________ Berton Mount, [place]

## 2015-02-23 NOTE — Progress Notes (Signed)
Patient Profile: 81 y.o. female with a past medical history significant for MDS, thrombocytopenia, dementia, COPD, and hypertension. She was admitted 02/17/15 with weakness and fever. On the evening of 02/19/15, she went into AF with RVR and was placed on Cardizem drip. She then had a post conversion pause of 5.3 seconds on IV diltiazem. She has had recurrent AF since.  Subjective: awareness of palpitations but denies chest pain and dyspnea.   Objective: Vital signs in last 24 hours: Temp:  [97.5 F (36.4 C)-98.7 F (37.1 C)] 97.9 F (36.6 C) (07/11 0404) Pulse Rate:  [86-120] 110 (07/11 0404) Resp:  [15-33] 28 (07/10 2154) BP: (133-191)/(70-90) 169/81 mmHg (07/11 0404) SpO2:  [85 %-95 %] 91 % (07/11 0404) Weight:  [123 lb 10.9 oz (56.1 kg)] 123 lb 10.9 oz (56.1 kg) (07/11 0500) Last BM Date: 02/13/15  Intake/Output from previous day: 07/10 0701 - 07/11 0700 In: 253 [P.O.:250; I.V.:3] Out: 1125 [Urine:1125] Intake/Output this shift:    Medications Current Facility-Administered Medications  Medication Dose Route Frequency Provider Last Rate Last Dose  . 0.9 %  sodium chloride infusion   Intravenous Continuous Lavina Hamman, MD 50 mL/hr at 02/20/15 2210    . acetaminophen (TYLENOL) tablet 650 mg  650 mg Oral Q6H PRN Lavina Hamman, MD       Or  . acetaminophen (TYLENOL) suppository 650 mg  650 mg Rectal Q6H PRN Lavina Hamman, MD      . acetaminophen-codeine (TYLENOL #3) 300-30 MG per tablet 1-2 tablet  1-2 tablet Oral Q4H PRN Lavina Hamman, MD   2 tablet at 02/23/15 0503  . albuterol (PROVENTIL) (2.5 MG/3ML) 0.083% nebulizer solution 2.5 mg  2.5 mg Nebulization Q2H PRN Dianne Dun, NP   2.5 mg at 02/22/15 0341  . amLODipine (NORVASC) tablet 5 mg  5 mg Oral Daily Nita Sells, MD   5 mg at 02/22/15 1218  . chlorhexidine (PERIDEX) 0.12 % solution 15 mL  15 mL Mouth Rinse BID Lavina Hamman, MD   15 mL at 02/22/15 2156  . digoxin (LANOXIN) 0.25 MG/ML  injection 0.125 mg  0.125 mg Intravenous Daily Dianne Dun, NP   0.125 mg at 02/22/15 1035  . doxycycline (VIBRA-TABS) tablet 100 mg  100 mg Oral Q12H Nita Sells, MD   100 mg at 02/22/15 2157  . FLUoxetine (PROZAC) capsule 30 mg  30 mg Oral q morning - 10a Lavina Hamman, MD   30 mg at 02/22/15 0827  . hydrALAZINE (APRESOLINE) injection 20 mg  20 mg Intravenous Q6H PRN Dianne Dun, NP   20 mg at 02/21/15 0551  . ipratropium-albuterol (DUONEB) 0.5-2.5 (3) MG/3ML nebulizer solution 3 mL  3 mL Nebulization BID Berneda Rose, MD   3 mL at 02/22/15 2046  . lidocaine (LIDODERM) 5 % 1 patch  1 patch Transdermal Q24H Nita Sells, MD   1 patch at 02/22/15 1219  . LORazepam (ATIVAN) injection 0.25 mg  0.25 mg Intravenous Once Jeryl Columbia, NP      . LORazepam (ATIVAN) tablet 0.5 mg  0.5 mg Oral Q8H PRN Lavina Hamman, MD   0.5 mg at 02/22/15 2241  . metoprolol (LOPRESSOR) injection 5 mg  5 mg Intravenous Q4H PRN Dianne Dun, NP   5 mg at 02/22/15 1202  . metoprolol succinate (TOPROL-XL) 24 hr tablet 50 mg  50 mg Oral Daily Velvet Bathe, MD   50 mg at 02/22/15 0827  . ondansetron (ZOFRAN)  tablet 4 mg  4 mg Oral Q6H PRN Lavina Hamman, MD       Or  . ondansetron [facility]) injection 4 mg  4 mg Intravenous Q6H PRN Lavina Hamman, MD      . oxyCODONE (Oxy IR/ROXICODONE) immediate release tablet 10 mg  10 mg Oral Q4H PRN Velvet Bathe, MD   10 mg at 02/22/15 4008  . polyethylene glycol (MIRALAX / GLYCOLAX) packet 17 g  17 g Oral Daily Nita Sells, MD   17 g at 02/22/15 0827  . predniSONE (DELTASONE) tablet 60 mg  60 mg Oral QAC breakfast Nita Sells, MD   60 mg at 02/23/15 0753  . sodium chloride 0.9 % injection 3 mL  3 mL Intravenous Q12H Lavina Hamman, MD   3 mL at 02/22/15 2158  . sorbitol 70 % solution 30 mL  30 mL Oral Daily PRN Nita Sells, MD        PE: General appearance: alert, cooperative and no distress Neck: no  carotid bruit and no JVD Lungs: clear to auscultation bilaterally Heart: irregularly irregular rhythm Extremities: no LEE Pulses: 2+ and symmetric Skin: warm and dry Neurologic: Grossly normal  Lab Results:   Recent Labs  02/21/15 0813 02/22/15 1015 02/23/15 0249  WBC 13.1* 13.0* 12.5*  HGB 10.6* 9.1* 9.4*  HCT 31.2* 27.5* 28.9*  PLT 32* 23* 24*   BMET  Recent Labs  02/22/15 0339 02/22/15 1015 02/23/15 0249  NA 138 137 136  K 4.8 3.5 3.8  CL 107 104 103  CO2 23 24 25   GLUCOSE 98 111* 113*  BUN 25* 21* 24*  CREATININE 0.70 0.62 0.65  CALCIUM 8.5* 8.2* 8.0*      Assessment/Plan    Principal Problem:   General weakness Active Problems:   Essential hypertension   COPD GOLD II   MDS (myelodysplastic syndrome) with 5q deletion   Dizziness and giddiness   Tobacco abuse   Persistent headaches   Chronic respiratory failure   Thrombocytopenia due to drugs   Fracture of lumbar spine L 1-3 transverse process   Atrial fibrillation   SSS (sick sinus syndrome)   Tachycardia-bradycardia  1. PAF: back in atrial fibrillation. Ventricular rate in the 100s-110s. Continue metoprolol and dilt. CHADS2VASC is at least 6 - not felt to be a candidate for anticoagulation due to thrombocytopenia.  2. Pauses: no recurrent pauses observed overnight. Favor conservative approach at this time given thrombocytopenia and recent infections and fever. No plans for PPM at this time.    LOS: 5 days    Brittainy M. Ladoris Gene 02/23/2015 7:58 AM  EP Attending  Patient seen and examined. I have reviewed the findings on exam, data and plan as noted by [facility] M. Simmons PA-C above and agree with her. Continue conservative approach. She is currently not a candidate for PPM. Rate control as tolerated is indicated.   Mikle Bosworth.D.

## 2015-02-23 NOTE — Care Management (Signed)
Important Message  Patient Details  Name: Tonya Mckee MRN: [id number] Date of Birth: [birth year]   Medicare Important Message Given:  Yes-second notification given    Nathen May 02/23/2015, 2:55 PM

## 2015-02-23 NOTE — Progress Notes (Signed)
Pt awoke with c/o headache 6/10 and was given vicodin 2 tabs as ordered. Pt was also anxious. K.Schorr NP made aware that she has been receiving Ativan 0.5mg  PO q8hrs and that she had received a dose at 22:30 with relief. Additional dose order requested.New orders received.

## 2015-02-23 NOTE — Progress Notes (Signed)
Ativan dose not given because pt was asleep. Pt appears more comfortable. Less tachypneic.

## 2015-02-23 NOTE — Care Management Note (Addendum)
Case Management Note  Patient Details  Name: Tonya Mckee MRN: [id number] Date of Birth: 09/19/1928   Subjective/Objective: Pt admitted initially for COPD exacerbation. Pt a transfer from [facility] due to Afib RVR initiated on Cardizem gtt now transitioned to po.                  Action/Plan: Recommendations for SNF at this time. CSW to assist with disposition needs. No further needs from CM at this time.    Expected Discharge Date:  02/19/15               Expected Discharge Plan:  [place]  In-House Referral:     Discharge planning Services  CM Consult  Post Acute Care Choice:    Choice offered to:     DME Arranged:    DME Agency:     HH Arranged:    HH Agency:     Status of Service:  In process, will continue to follow  Medicare Important Message Given:    Date Medicare IM Given:    Medicare IM give by:    Date Additional Medicare IM Given:    Additional Medicare Important Message give by:     If discussed at [place] of Stay Meetings, dates discussed:    Additional Comments:  Bethena Roys, RN 02/23/2015, 12:14 PM

## 2015-02-23 NOTE — Progress Notes (Signed)
Tonya Mckee   DOB:31-Jul-1929   PJ#:[id number]    Subjective: She was transferred to Tonya Mckee for further care regarding arrhythmia. She complained of significant bruising. This morning, she woke up feeling dizzy and according to her, was noted to have another bout of arrhythmia. She denies current chest pain or shortness of breath. She has significant bruising all over.The patient denies any recent signs or symptoms of bleeding such as spontaneous epistaxis, hematuria or hematochezia. She denies further jaw pain.   Objective:  Filed Vitals:   02/23/15 0404  BP: 169/81  Pulse: 110  Temp: 97.9 F (36.6 C)  Resp:      Intake/Output Summary (Last 24 hours) at 02/23/15 0810 Last data filed at 02/23/15 0500  Gross per 24 hour  Intake    253 ml  Output    925 ml  Net   -672 ml    GENERAL:alert, no distress and comfortable SKIN: Significant bruising is noted. EYES: normal, Conjunctiva are pale and non-injected, sclera clear OROPHARYNX:no exudate, no erythema and lips, buccal mucosa, and tongue normal  HEART: Irregular rate and rhythm and no lower extremity edema ABDOMEN:abdomen soft, non-tender and normal bowel sounds Musculoskeletal:no cyanosis of digits and no clubbing  NEURO: alert & oriented x 3 with fluent speech, no focal motor/sensory deficits   Labs:  Lab Results  Component Value Date   WBC 12.5* 02/23/2015   HGB 9.4* 02/23/2015   HCT 28.9* 02/23/2015   MCV 93.8 02/23/2015   PLT 24* 02/23/2015   NEUTROABS 5.6 02/23/2015    Lab Results  Component Value Date   NA 136 02/23/2015   K 3.8 02/23/2015   CL 103 02/23/2015   CO2 25 02/23/2015    Studies:  Dg Chest 2 View  02/22/2015   CLINICAL DATA:  Acute on chronic congestive heart failure. COPD. Myelodysplastic syndrome.  EXAM: CHEST  2 VIEW  COMPARISON:  02/18/2015  FINDINGS: Moderate cardiomegaly stable. Increased diffuse interstitial prominence is suspicious for mild interstitial edema. New small bilateral  pleural effusions also seen. Pulmonary hyperinflation again demonstrated, consistent with COPD. Surgical clips again seen in the left axilla.  IMPRESSION: Increased diffuse interstitial infiltrates/edema and small bilateral pleural effusions, suspicious for congestive heart failure.  Stable cardiomegaly and COPD.   Electronically Signed   By: Earle Gell M.D.   On: 02/22/2015 14:47    Assessment & Plan:    MDS (myelodysplastic syndrome) with 5q deletion She has profound bone marrow suppression and pancytopenia due to recent dental infection. At present time, she will continue to hold Revlimid and remain on prednisone. We will continue transfusion support and blood work monitoring weekly.  Anemia in neoplastic disease This is likely anemia of chronic disease and related to her bone marrow disorder. The patient denies recent history of bleeding such as epistaxis, hematuria or hematochezia. She will get 2 units of blood whenever hemoglobin dropped to less than 8 g  Thrombocytopenia due to drugs and recent infection This is likely due to recent infection and underlying disease. The patient denies recent history of bleeding such as epistaxis, hematuria or hematochezia.  She is asymptomatic from the low platelet count. Will observe for now.  She will get platelet transfusion if it dropped to less than 10,000 or if she has signs of bleeding  Atrial Fibrillation with RVR This was complicated with bradycardia and presyncope after Diltiazem IV was given, for which she was moved to stepdown and now transferred to Tonya Mckee. Continue medical management Appreciate Cardiology  evaluation  COPD exacerbation This is being managed with solumedrol, oxygen and nebs.  Dental infection Jaw pain She is currently receiving broad-spectrum IV anti-biotics Cultures are negative to date She is on oral oxyIR and tylenol Consider IV pain meds as needed for relief until symptoms improve.   Poor venous  access She was originally scheduled for port placement on Monday. However, in view of recent infection, recommend deferring port placement and delayed by at least 1 week.  Profound weakness She is undergoing physical therapy evaluation and will likely be discharged to skilled nursing facility  Intermittent headaches CT scan was negative.  Will follow.Other medical issues as per admitting team  [facility], [place], MD 02/23/2015  8:10 AM

## 2015-02-24 MED ORDER — DIGOXIN 125 MCG PO TABS
0.1250 mg | ORAL_TABLET | Freq: Every day | ORAL | Status: DC
  Administered 2015-02-24: 0.125 mg via ORAL
  Filled 2015-02-24: qty 1

## 2015-02-24 MED ORDER — CLINDAMYCIN HCL 300 MG PO CAPS: 300.0000 mg | ORAL_CAPSULE | Freq: Four times a day (QID) | ORAL | Status: DC

## 2015-02-24 MED ORDER — AMLODIPINE BESYLATE 5 MG PO TABS: 5.0000 mg | ORAL_TABLET | Freq: Every day | ORAL | Status: DC

## 2015-02-24 MED ORDER — POLYETHYLENE GLYCOL 3350 17 G PO PACK: 17.0000 g | PACK | Freq: Every day | ORAL | Status: AC

## 2015-02-24 MED ORDER — SORBITOL 70 % SOLN: 30.0000 mL | Freq: Every day | Status: AC | PRN

## 2015-02-24 MED ORDER — PREDNISONE 20 MG PO TABS: 60.0000 mg | ORAL_TABLET | Freq: Every day | ORAL | Status: DC

## 2015-02-24 MED ORDER — DIGOXIN 125 MCG PO TABS: 0.1250 mg | ORAL_TABLET | Freq: Every day | ORAL | Status: AC

## 2015-02-24 NOTE — Progress Notes (Signed)
Attempted to call report to [facility], left on hold after speaking to operator

## 2015-02-24 NOTE — Progress Notes (Deleted)
Physician Discharge Summary  Tonya Mckee BWL:[id number] DOB: 05-12-1929 DOA: 02/17/2015  PCP: [name]  Admit date: 02/17/2015 Discharge date: 02/24/2015  Time spent: 45 minutes  Recommendations for Outpatient Follow-up:  1. Tonya Mckee will need outpatient goals of care as directed by the primary care physician or oncologist-she is to follow up with oncologist soon 2. No further antibiotics for recurrent infections-is currently edentulous  Order on Discharge   - amLODipine (NORVASC) 5 MG tablet added this admission    - digoxin (LANOXIN) 0.125 MG tablet  - polyethylene glycol (MIRALAX / GLYCOLAX) packet    - predniSONE (DELTASONE) 20 MG tablet   Prescribed 60 mg for 5 days on discharge Would recommend a slow taper of 40 mg after 5 days over and then 20 mg for 5 days until seen by oncology and primary physician   -sorbitol 70 % SOLN  Stop Taking on Discharge   - lenalidomide (REVLIMID) 2.5 MG capsule this medication should be delineated by his primary care physician/oncologist as she may may not be a candidate for continuation of use  - valsartan (DIOVAN) 160 MG tablet    3.  Discharge to skilled facility 4. Will need oxygen on discharge 2 L at all times  Discharge Diagnoses:  Principal Problem:   General weakness Active Problems:   Essential hypertension   COPD GOLD II   MDS (myelodysplastic syndrome) with 5q deletion   Dizziness and giddiness   Tobacco abuse   Persistent headaches   Chronic respiratory failure   Thrombocytopenia due to drugs   Fracture of lumbar spine L 1-3 transverse process   Atrial fibrillation   SSS (sick sinus syndrome)   Tachycardia-bradycardia   Discharge Condition: Guarded-recommend discussion with family as well as HC. Daughter in the next 24-48 hours  Diet recommendation: Dys 3  Filed Weights   02/21/15 0400 02/23/15 0500 02/24/15 0424  Weight: 56.881 kg (125 lb 6.4 oz) 56.1 kg (123 lb 10.9 oz) 56.246 kg (124 lb)    History  of present illness:  Tonya Mckee ,? COPD, myelodysplastic syndrome 5 q deletion +pancytopenia, essential hypertension, chronic respiratory failure since 2015, chronic headache in setting recent dental surgery~ 2 wks PTA admit 02/18/15 c Fatigue and clinically a COPD exacerbation on admit She developed atrial fibrillation and beta blocker was continued Tonya Mckee developed pauses and cardiology subsequently consulted. Plan is to transition to [place] for possible pacemaker placement  Hospital Course:    Atrial fibrillation with RVR + sick sinus syndrome--we would tolerate heart rates in the 120s to 130s as long as they are nonsustained - not a good candidate for PPM per cardiology [recent infection/TCP] -medical management c Torpol 25 and Dig 0.125--her digoxin level was 0.6 this admission however it was felt this would be reasonable to continue for some element of rate control -no other options at present and will have to tolerate some element of tachycardia as pauses are more life threatenting -likely will need goals of care as an outpatient   Stage 3-4 severe COPD with acute exacerbation - on levaquin-d/c as Quinolones can prolong Qtc-Qtc today ~ 0.37 -started doxy which was transitioned to [place] on 7/11 and ultimately discontinued on 7/12 - on solumedrol -->Prednisone 60 daily 7/9 for 5 days-with then taper slowly to 40 mg for another 5 and then 20 mg - supplemental oxygen-desats with anxiety and we will continue either Ativan 0.25 twice a day or potentially add Roxanol as an outpatient - continue duonebs cautiously given Beta agonist effect  Essential hypertension - blood pressures slightly high -Continue Toprol low dose only gvien significant COPD h/o -added amlodipine 5 mg 02/22/15   MDS (myelodysplastic syndrome) with 5q deletion - transfusing if hgb less than 8.0. Repeat cbc stable. -bili slightly elevated probably from MDS -Dr. Alvy Bimler to determine needs for PORT placement and  ongoing chemo c Revlimid -would not taper steroids for this indication nor for COPD rapidly--would address as OP at Oncology follow up -appreciate input -poor candidate for indwelling lines given risk for sepsis and discussed with family    Persistent headaches, ? analgesic rebound HA - has had HA's "all her life" -has some occiptal tenderness and pain in neck - no focal neurological symptoms reported and CT scan of head reported no acute intracranial abnormality -Would avoid narcotic pain medications as well as other medications in the setting Would workup no further   Thrombocytopenia 2/2 MDS/Meds and superimposed dental infection - will avoid heparin, scd's for dvt prophylaxis -Smear shows normocytic anemia -cover dental infection with PO clinda. D/c doxycycline 7/11   General weakness - Consulted PT  Consultants:  none  Procedures:  none  Antibiotics:  Clindamycin>>>levaquin>>> doxycycline>>> clindamycin and ultimately discontinued 7/12   Discharge Exam: Filed Vitals:   02/24/15 1037  BP:   Pulse:   Temp: 98.9 F (37.2 C)  Resp:     General: Alert anxious oriented pleasant multiple somatic complaints including headache and abdominal pain. Cardiovascular: S1-S2 no murmur rub or gallop Respiratory: Mildly wheezy Abdomen soft nontender nondistended no rebound   Discharge Instructions   Discharge Instructions    Diet - low sodium heart healthy    Complete by:  As directed      Increase activity slowly    Complete by:  As directed           Current Discharge Medication List    START taking these medications   Details  amLODipine (NORVASC) 5 MG tablet Take 1 tablet (5 mg total) by mouth daily. Qty: 30 tablet, Refills: 0    clindamycin (CLEOCIN) 300 MG capsule Take 1 capsule (300 mg total) by mouth every 6 (six) hours. Qty: 21 capsule, Refills: 0    digoxin (LANOXIN) 0.125 MG tablet Take 1 tablet (0.125 mg total) by mouth daily. Qty: 30 tablet,  Refills: 0    !! polyethylene glycol (MIRALAX / GLYCOLAX) packet Take 17 g by mouth daily. Qty: 14 each, Refills: 0    sorbitol 70 % SOLN Take 30 mLs by mouth daily as needed for moderate constipation. Qty: 473 mL, Refills: 0     !! - Potential duplicate medications found. Please discuss with provider.    CONTINUE these medications which have CHANGED   Details  predniSONE (DELTASONE) 20 MG tablet Take 3 tablets (60 mg total) by mouth daily before breakfast. Qty: 15 tablet, Refills: 0      CONTINUE these medications which have NOT CHANGED   Details  acetaminophen (TYLENOL) 325 MG tablet Take 650 mg by mouth every 6 (six) hours as needed for moderate pain (pain).     acetaminophen-codeine (TYLENOL #3) 300-30 MG per tablet Take 1-2 tablets by mouth every 4 (four) hours as needed. pain Refills: 0    albuterol (PROVENTIL HFA;VENTOLIN HFA) 108 (90 BASE) MCG/ACT inhaler Inhale 2 puffs into the lungs every 6 (six) hours as needed for wheezing or shortness of breath (wheezing).     calcium carbonate (TUMS - DOSED IN MG ELEMENTAL CALCIUM) 500 MG chewable tablet Chew 1 tablet by mouth  daily as needed for indigestion or heartburn.    chlorhexidine (PERIDEX) 0.12 % solution 15 mLs by Mouth Rinse route 2 (two) times daily. Qty: 120 mL, Refills: 0    Cholecalciferol (VITAMIN D) 2000 UNITS tablet Take 2,000 Units by mouth daily.   Associated Diagnoses: MDS (myelodysplastic syndrome) with 5q deletion    FLUoxetine (PROZAC) 10 MG capsule Take 3 capsules (30 mg total) by mouth every morning. Qty: 30 capsule, Refills: 0    ipratropium-albuterol (DUONEB) 0.5-2.5 (3) MG/3ML SOLN Take 3 mLs by nebulization 2 (two) times daily as needed (wheezing). Qty: 360 mL, Refills: 0    LORazepam (ATIVAN) 0.5 MG tablet Take 1 tablet (0.5 mg total) by mouth every 8 (eight) hours as needed for anxiety (anxiety). Qty: 30 tablet, Refills: 0    metoprolol succinate (TOPROL-XL) 50 MG 24 hr tablet Take 50 mg by mouth  every evening. Take with or immediately following a meal.    tiotropium (SPIRIVA) 18 MCG inhalation capsule Place 18 mcg into inhaler and inhale daily.    valsartan (DIOVAN) 40 MG tablet Take 1 tablet by mouth daily. Refills: 2    famotidine (PEPCID AC) 10 MG tablet Take 1 tablet (10 mg total) by mouth 2 (two) times daily. Qty: 30 tablet, Refills: 0    menthol-cetylpyridinium (CEPACOL) 3 MG lozenge Take 1 lozenge (3 mg total) by mouth as needed for sore throat. Qty: 30 tablet, Refills: 0    ondansetron (ZOFRAN) 4 MG tablet Take 1 tablet (4 mg total) by mouth every 6 (six) hours as needed for nausea. Qty: 20 tablet, Refills: 0    !! polyethylene glycol (MIRALAX / GLYCOLAX) packet Take 17 g by mouth daily. Qty: 14 each, Refills: 0   Associated Diagnoses: Other constipation     !! - Potential duplicate medications found. Please discuss with provider.    STOP taking these medications     lenalidomide (REVLIMID) 2.5 MG capsule        Allergies  Allergen Reactions  . Donepezil Nausea And Vomiting  . Penicillins Itching and Nausea And Vomiting  . Amoxapine And Related Nausea Only  . Doxycycline Rash  . Keflex [Cephalexin] Itching  . Bupropion Other (See Comments)    "Headaches on the XL."   . Cardizem [Diltiazem Hcl] Other (See Comments)    Bradycardia/presyncope  . Ketek [Telithromycin] Itching    Dry Mouth  . Levaquin [Levofloxacin In D5w] Other (See Comments)    Insomnia  . Sulfa Antibiotics Nausea And Vomiting  . Tramadol Other (See Comments)    "Made her feel like a zombie."  . Zyrtec [Cetirizine] Other (See Comments)    "made her like a zombie"      The results of significant diagnostics from this hospitalization (including imaging, microbiology, ancillary and laboratory) are listed below for reference.    Significant Diagnostic Studies: Dg Chest 2 View  02/22/2015   CLINICAL DATA:  Acute on chronic congestive heart failure. COPD. Myelodysplastic syndrome.   EXAM: CHEST  2 VIEW  COMPARISON:  02/18/2015  FINDINGS: Moderate cardiomegaly stable. Increased diffuse interstitial prominence is suspicious for mild interstitial edema. New small bilateral pleural effusions also seen. Pulmonary hyperinflation again demonstrated, consistent with COPD. Surgical clips again seen in the left axilla.  IMPRESSION: Increased diffuse interstitial infiltrates/edema and small bilateral pleural effusions, suspicious for congestive heart failure.  Stable cardiomegaly and COPD.   Electronically Signed   By: Earle Gell M.D.   On: 02/22/2015 14:47   Dg Chest 2 View  02/18/2015   CLINICAL DATA:  Fever  EXAM: CHEST  2 VIEW  COMPARISON:  09/15/2014  FINDINGS: Chronic cardiomegaly. Stable mild aortic tortuosity when accounting for rotation. Chronic interstitial coarsening and hyperinflation, bronchitic and emphysematous disease based on 12/19/2013 chest CT. No superimposed consolidation. Small bilateral pleural effusions are present. Status post left breast and axillary surgery.  IMPRESSION: COPD and trace bilateral pleural effusion. No pneumonia to explain the history of fever.   Electronically Signed   By: Monte Fantasia M.D.   On: 02/18/2015 03:23   Ct Head Wo Contrast  02/18/2015   CLINICAL DATA:  Blurred vision, generalized weakness.  EXAM: CT HEAD WITHOUT CONTRAST  TECHNIQUE: Contiguous axial images were obtained from the base of the skull through the vertex without intravenous contrast.  COMPARISON:  CT scan of September 15, 2014.  FINDINGS: Bony calvarium appears intact. Mild diffuse cortical atrophy is noted. Mild chronic ischemic white matter disease is noted. Old lacunar infarctions are noted in the right basal ganglia which are unchanged compared to prior exam. No mass effect or midline shift is noted. Ventricular size is within normal limits. There is no evidence of mass lesion, hemorrhage or acute infarction.  IMPRESSION: Mild diffuse cortical atrophy. Mild chronic ischemic white  matter disease. No acute intracranial abnormality seen.   Electronically Signed   By: Marijo Conception, M.D.   On: 02/18/2015 08:45    Microbiology: Recent Results (from the past 240 hour(s))  Blood culture (routine x 2)     Status: None   Collection Time: 02/18/15  2:41 AM  Result Value Ref Range Status   Specimen Description BLOOD BLOOD RIGHT FOREARM  Final   Special Requests BOTTLES DRAWN AEROBIC AND ANAEROBIC 5CC  Final   Culture   Final    NO GROWTH 5 DAYS Performed at [facility]    Report Status 02/23/2015 FINAL  Final  Urine culture     Status: None   Collection Time: 02/18/15  2:43 AM  Result Value Ref Range Status   Specimen Description URINE, CLEAN CATCH  Final   Special Requests NONE  Final   Culture   Final    NO GROWTH 1 DAY Performed at [facility]    Report Status 02/19/2015 FINAL  Final  Blood culture (routine x 2)     Status: None   Collection Time: 02/18/15  2:46 AM  Result Value Ref Range Status   Specimen Description BLOOD RIGHT ANTECUBITAL  Final   Special Requests BOTTLES DRAWN AEROBIC AND ANAEROBIC 5CC  Final   Culture   Final    NO GROWTH 5 DAYS Performed at [facility]    Report Status 02/23/2015 FINAL  Final  MRSA PCR Screening     Status: None   Collection Time: 02/19/15  8:16 PM  Result Value Ref Range Status   MRSA by PCR NEGATIVE NEGATIVE Final    Comment:        The GeneXpert MRSA Assay (FDA approved for NASAL specimens only), is one component of a comprehensive MRSA colonization surveillance program. It is not intended to diagnose MRSA infection nor to guide or monitor treatment for MRSA infections.      Labs: Basic Metabolic Panel:  Recent Labs Lab 02/20/15 0225 02/21/15 0813 02/22/15 0339 02/22/15 1015 02/23/15 0249  NA 136 137 138 137 136  K 4.4 4.1 4.8 3.5 3.8  CL 107 106 107 104 103  CO2 22 21* 23 24 25   GLUCOSE 146* 144*  98 111* 113*  BUN 15 25* 25* 21* 24*  CREATININE 0.55 0.74 0.70 0.62  0.65  CALCIUM 8.5* 8.6* 8.5* 8.2* 8.0*  MG  --  1.9  --   --   --    Liver Function Tests:  Recent Labs Lab 02/18/15 0050 02/18/15 0643 02/21/15 0813 02/22/15 0339 02/23/15 0249  AST 16 13* 53* 43* 24  ALT 7* 8* 59* 50 34  ALKPHOS 49 44 74 69 62  BILITOT 1.7* 1.8* 2.0* 2.1* 2.1*  PROT 7.1 6.1* 6.4* 6.1* 6.4*  ALBUMIN 3.0* 2.9* 2.6* 2.5* 2.3*   No results for input(s): LIPASE, AMYLASE in the last 168 hours. No results for input(s): AMMONIA in the last 168 hours. CBC:  Recent Labs Lab 02/18/15 0643 02/19/15 1330 02/20/15 0225 02/21/15 0813 02/22/15 1015 02/23/15 0249  WBC 8.8 13.4* 9.8 13.1* 13.0* 12.5*  NEUTROABS 3.4  --   --  9.2*  --  5.6  HGB 7.6* 9.9* 10.2* 10.6* 9.1* 9.4*  HCT 23.6* 29.8* 30.8* 31.2* 27.5* 28.9*  MCV 95.9 92.0 92.5 92.0 93.9 93.8  PLT 19* 29* 30* 32* 23* 24*   Cardiac Enzymes:  Recent Labs Lab 02/18/15 0130 02/18/15 0145 02/19/15 1935 02/20/15 0225 02/20/15 0645  CKTOTAL 19*  --   --   --   --   TROPONINI  --  0.03 <0.03 <0.03 0.04*   BNP: BNP (last 3 results) No results for input(s): BNP in the last 8760 hours.  ProBNP (last 3 results) No results for input(s): PROBNP in the last 8760 hours.  CBG:  Recent Labs Lab 02/18/15 0021  GLUCAP 96       Signed:  Nita Sells  [facility] 02/24/2015, 10:47 AM
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
## 2015-02-24 NOTE — Progress Notes (Addendum)
Occupational Therapy Treatment Patient Details Name: Tonya Mckee MRN: [id number] DOB: 12-31-1928 Today's Date: 02/24/2015    History of present illness 81 y.o. female admitted to [facility] with COPD exac. Transferred to [facility] with afib with RVR. hx of htn,COPD, CVA, vertigo, cancer. Pt is from home alone. On 2L 02 at home.    OT comments  Pt limited by headache and increased HR (up to 134). Continue to recommend SNF for rehab.  Follow Up Recommendations  SNF;Supervision/Assistance - 24 hour    Equipment Recommendations  Other (comment) (defer to next venue)    Recommendations for Other Services      Precautions / Restrictions Precautions Precautions: Fall Precaution Comments: watch HR and O2 sats Restrictions Weight Bearing Restrictions: No       Mobility Bed Mobility Overal bed mobility: Needs Assistance Bed Mobility: Supine to Sit;Sit to Supine     Supine to sit: Max assist Sit to supine: Max assist   General bed mobility comments: assist with trunk and LEs to get to EOB as well as to return to supine. Trendelenburg position used to scoot pt HOB as well as assist from OT.  Cues for technique for bed mobility.  Transfers                 General transfer comment: not assessed    Balance Overall balance assessment: Needs assistance    Assist to help maintain midline position sitting EOB.                             ADL Overall ADL's : Needs assistance/impaired Eating/Feeding: Bed level;Modified independent Eating/Feeding Details (indicate cue type and reason): drank water Grooming: Wash/dry face;Sitting;Supervision/safety;Set up   Upper Body Bathing: Minimal assitance;Sitting Upper Body Bathing Details (indicate cue type and reason): washed armpits     Upper Body Dressing : Sitting;Total assistance;Bed level Upper Body Dressing Details (indicate cue type and reason): Vomit on gown-OT assisted                    General ADL Comments: Pt  sat EOB and washed face and armpits. Assist given to scoot to EOB and to help with midline positioning.       Vision                     Perception     Praxis      Cognition  Awake/Alert Behavior During Therapy: WFL for tasks assessed/performed Overall Cognitive Status: No family/caregiver present to determine baseline cognitive functioning                       Extremity/Trunk Assessment               Exercises     Shoulder Instructions       General Comments      Pertinent Vitals/ Pain       Pain Assessment: 0-10 Pain Score: 10-Worst pain ever Pain Location: head and right side Pain Descriptors / Indicators: Headache Pain Intervention(s): Monitored during session;Limited activity within patient's tolerance;Repositioned;Other (comment) (notified nurse of headache)   HR up to 134 in session. Pt on 3L of O2. Cues for deep breathing.  Home Living  Prior Functioning/Environment              Frequency Min 2X/week     Progress Toward Goals  OT Goals(current goals can now be found in the care plan section)  Progress towards OT goals: Progressing toward goals  Acute Rehab OT Goals Patient Stated Goal: not stated OT Goal Formulation: With patient Time For Goal Achievement: 03/07/15 Potential to Achieve Goals: Good ADL Goals Pt Will Perform Grooming: with min guard assist;standing (1 activity) Pt Will Perform Upper Body Dressing: with set-up;sitting Pt Will Perform Lower Body Dressing: with set-up;sit to/from stand Pt Will Transfer to Toilet: with min guard assist;ambulating;regular height toilet Pt Will Perform Toileting - Clothing Manipulation and hygiene: with min guard assist;sit to/from stand Additional ADL Goal #1: Pt will utilize pursed lip breathing and pacing during ADL and mobility with minimal verbal cues.  Plan Discharge plan remains appropriate    Co-evaluation                  End of Session Equipment Utilized During Treatment: Oxygen   Activity Tolerance Other (comment);Patient limited by pain (increased HR)   Patient Left in bed;with call bell/phone within reach;with bed alarm set; SCDs applied   Nurse Communication Other (comment) (headache and pt vomited)        Time: 8299-3716 OT Time Calculation (min): 16 min  Charges: OT General Charges $OT Visit: 1 Procedure OT Treatments $Self Care/Home Management : 8-22 mins   Benito Mccreedy OTR/L [phone] 02/24/2015, 10:00 AM

## 2015-02-25 NOTE — Discharge Summary (Signed)
Physician Discharge Summary  Tonya Mckee EHU:[id number] DOB: Mar 13, 1929 DOA: 02/17/2015  PCP: [name]  Admit date: 02/17/2015 Discharge date: 02/25/2015  Time spent: 45 minutes  Recommendations for Outpatient Follow-up:  1. Patient will need outpatient goals of care as directed by the primary care physician or oncologist-she is to follow up with oncologist soon 2. No further antibiotics for recurrent infections-is currently edentulous  Order on Discharge   - amLODipine (NORVASC) 5 MG tablet added this admission    - digoxin (LANOXIN) 0.125 MG tablet  - polyethylene glycol (MIRALAX / GLYCOLAX) packet    - predniSONE (DELTASONE) 20 MG tablet   Prescribed 60 mg for 5 days on discharge Would recommend a slow taper of 40 mg after 5 days over and then 20 mg for 5 days until seen by oncology and primary physician   -sorbitol 70 % SOLN  Stop Taking on Discharge   - lenalidomide (REVLIMID) 2.5 MG capsule this medication should be delineated by his primary care physician/oncologist as she may may not be a candidate for continuation of use  - valsartan (DIOVAN) 160 MG tablet    3.  Discharge to skilled facility 4. Will need oxygen on discharge 2 L at all times  Discharge Diagnoses:  Principal Problem:   General weakness Active Problems:   Essential hypertension   COPD GOLD II   MDS (myelodysplastic syndrome) with 5q deletion   Dizziness and giddiness   Tobacco abuse   Persistent headaches   Chronic respiratory failure   Thrombocytopenia due to drugs   Fracture of lumbar spine L 1-3 transverse process   Atrial fibrillation   SSS (sick sinus syndrome)   Tachycardia-bradycardia   Discharge Condition: Guarded-recommend discussion with family as well as HC. Daughter in the next 24-48 hours  Diet recommendation: Dys 3  Filed Weights   02/21/15 0400 02/23/15 0500 02/24/15 0424  Weight: 56.881 kg (125 lb 6.4 oz) 56.1 kg (123 lb 10.9 oz) 56.246 kg (124 lb)    History  of present illness:  Patient ,? COPD, myelodysplastic syndrome 5 q deletion +pancytopenia, essential hypertension, chronic respiratory failure since 2015, chronic headache in setting recent dental surgery~ 2 wks PTA admit 02/18/15 c Fatigue and clinically a COPD exacerbation on admit She developed atrial fibrillation and beta blocker was continued patient developed pauses and cardiology subsequently consulted. Plan is to transition to [place] for possible pacemaker placement  Hospital Course:    Atrial fibrillation with RVR + sick sinus syndrome--we would tolerate heart rates in the 120s to 130s as long as they are nonsustained - not a good candidate for PPM per cardiology [recent infection/TCP] -medical management c Torpol 25 and Dig 0.125--her digoxin level was 0.6 this admission however it was felt this would be reasonable to continue for some element of rate control -no other options at present and will have to tolerate some element of tachycardia as pauses are more life threatenting -likely will need goals of care as an outpatient   Stage 3-4 severe COPD with acute exacerbation - on levaquin-d/c as Quinolones can prolong Qtc-Qtc today ~ 0.37 -started doxy which was transitioned to [place] on 7/11 and ultimately discontinued on 7/12 - on solumedrol -->Prednisone 60 daily 7/9 for 5 days-with then taper slowly to 40 mg for another 5 and then 20 mg - supplemental oxygen-desats with anxiety and we will continue either Ativan 0.25 twice a day or potentially add Roxanol as an outpatient - continue duonebs cautiously given Beta agonist effect  Essential hypertension - blood pressures slightly high -Continue Toprol low dose only gvien significant COPD h/o -added amlodipine 5 mg 02/22/15   MDS (myelodysplastic syndrome) with 5q deletion - transfusing if hgb less than 8.0. Repeat cbc stable. -bili slightly elevated probably from MDS -Dr. Alvy Bimler to determine needs for PORT placement and  ongoing chemo c Revlimid -would not taper steroids for this indication nor for COPD rapidly--would address as OP at Oncology follow up -appreciate input -poor candidate for indwelling lines given risk for sepsis and discussed with family    Persistent headaches, ? analgesic rebound HA - has had HA's "all her life" -has some occiptal tenderness and pain in neck - no focal neurological symptoms reported and CT scan of head reported no acute intracranial abnormality -Would avoid narcotic pain medications as well as other medications in the setting Would workup no further   Thrombocytopenia 2/2 MDS/Meds and superimposed dental infection - will avoid heparin, scd's for dvt prophylaxis -Smear shows normocytic anemia -cover dental infection with PO clinda. D/c doxycycline 7/11   General weakness - Consulted PT  Consultants:  none  Procedures:  none  Antibiotics:  Clindamycin>>>levaquin>>> doxycycline>>> clindamycin and ultimately discontinued 7/12   Discharge Exam: Filed Vitals:   02/24/15 1037  BP:   Pulse:   Temp: 98.9 F (37.2 C)  Resp:     General: Alert anxious oriented pleasant multiple somatic complaints including headache and abdominal pain. Cardiovascular: S1-S2 no murmur rub or gallop Respiratory: Mildly wheezy Abdomen soft nontender nondistended no rebound   Discharge Instructions   Discharge Instructions    Diet - low sodium heart healthy    Complete by:  As directed      Increase activity slowly    Complete by:  As directed           Discharge Medication List as of 02/24/2015 12:54 PM    START taking these medications   Details  amLODipine (NORVASC) 5 MG tablet Take 1 tablet (5 mg total) by mouth daily., Starting 02/24/2015, Until Discontinued, Normal    digoxin (LANOXIN) 0.125 MG tablet Take 1 tablet (0.125 mg total) by mouth daily., Starting 02/24/2015, Until Discontinued, No Print    !! polyethylene glycol (MIRALAX / GLYCOLAX) packet Take  17 g by mouth daily., Starting 02/24/2015, Until Discontinued, Normal    sorbitol 70 % SOLN Take 30 mLs by mouth daily as needed for moderate constipation., Starting 02/24/2015, Until Discontinued, Normal     !! - Potential duplicate medications found. Please discuss with provider.    CONTINUE these medications which have CHANGED   Details  predniSONE (DELTASONE) 20 MG tablet Take 3 tablets (60 mg total) by mouth daily before breakfast., Starting 02/24/2015, Until Discontinued, No Print      CONTINUE these medications which have NOT CHANGED   Details  acetaminophen (TYLENOL) 325 MG tablet Take 650 mg by mouth every 6 (six) hours as needed for moderate pain (pain). , Until Discontinued, Historical Med    acetaminophen-codeine (TYLENOL #3) 300-30 MG per tablet Take 1-2 tablets by mouth every 4 (four) hours as needed. pain, Starting 12/06/2014, Until Discontinued, Historical Med    albuterol (PROVENTIL HFA;VENTOLIN HFA) 108 (90 BASE) MCG/ACT inhaler Inhale 2 puffs into the lungs every 6 (six) hours as needed for wheezing or shortness of breath (wheezing). , Until Discontinued, Historical Med    calcium carbonate (TUMS - DOSED IN MG ELEMENTAL CALCIUM) 500 MG chewable tablet Chew 1 tablet by mouth daily as needed for indigestion or heartburn.,  Until Discontinued, Historical Med    chlorhexidine (PERIDEX) 0.12 % solution 15 mLs by Mouth Rinse route 2 (two) times daily., Starting 09/17/2014, Until Discontinued, Normal    Cholecalciferol (VITAMIN D) 2000 UNITS tablet Take 2,000 Units by mouth daily., Until Discontinued, Historical Med    FLUoxetine (PROZAC) 10 MG capsule Take 3 capsules (30 mg total) by mouth every morning., Starting 09/17/2014, Until Discontinued, Normal    ipratropium-albuterol (DUONEB) 0.5-2.5 (3) MG/3ML SOLN Take 3 mLs by nebulization 2 (two) times daily as needed (wheezing)., Starting 09/17/2014, Until Discontinued, Print    LORazepam (ATIVAN) 0.5 MG tablet Take 1 tablet (0.5 mg  total) by mouth every 8 (eight) hours as needed for anxiety (anxiety)., Starting 09/17/2014, Until Discontinued, Print    metoprolol succinate (TOPROL-XL) 50 MG 24 hr tablet Take 50 mg by mouth every evening. Take with or immediately following a meal., Until Discontinued, Historical Med    tiotropium (SPIRIVA) 18 MCG inhalation capsule Place 18 mcg into inhaler and inhale daily., Until Discontinued, Historical Med    valsartan (DIOVAN) 40 MG tablet Take 1 tablet by mouth daily., Starting 01/31/2015, Until Discontinued, Historical Med    famotidine (PEPCID AC) 10 MG tablet Take 1 tablet (10 mg total) by mouth 2 (two) times daily., Starting 09/17/2014, Until Discontinued, Normal    menthol-cetylpyridinium (CEPACOL) 3 MG lozenge Take 1 lozenge (3 mg total) by mouth as needed for sore throat., Starting 08/01/2014, Until Discontinued, Print    ondansetron (ZOFRAN) 4 MG tablet Take 1 tablet (4 mg total) by mouth every 6 (six) hours as needed for nausea., Starting 09/17/2014, Until Discontinued, Normal    !! polyethylene glycol (MIRALAX / GLYCOLAX) packet Take 17 g by mouth daily., Starting 09/17/2014, Until Discontinued, Normal     !! - Potential duplicate medications found. Please discuss with provider.    STOP taking these medications     lenalidomide (REVLIMID) 2.5 MG capsule        Allergies  Allergen Reactions  . Donepezil Nausea And Vomiting  . Penicillins Itching and Nausea And Vomiting  . Amoxapine And Related Nausea Only  . Doxycycline Rash  . Keflex [Cephalexin] Itching  . Bupropion Other (See Comments)    "Headaches on the XL."   . Cardizem [Diltiazem Hcl] Other (See Comments)    Bradycardia/presyncope  . Ketek [Telithromycin] Itching    Dry Mouth  . Levaquin [Levofloxacin In D5w] Other (See Comments)    Insomnia  . Sulfa Antibiotics Nausea And Vomiting  . Tramadol Other (See Comments)    "Made her feel like a zombie."  . Zyrtec [Cetirizine] Other (See Comments)    "made her  like a zombie"      The results of significant diagnostics from this hospitalization (including imaging, microbiology, ancillary and laboratory) are listed below for reference.    Significant Diagnostic Studies: Dg Chest 2 View  02/22/2015   CLINICAL DATA:  Acute on chronic congestive heart failure. COPD. Myelodysplastic syndrome.  EXAM: CHEST  2 VIEW  COMPARISON:  02/18/2015  FINDINGS: Moderate cardiomegaly stable. Increased diffuse interstitial prominence is suspicious for mild interstitial edema. New small bilateral pleural effusions also seen. Pulmonary hyperinflation again demonstrated, consistent with COPD. Surgical clips again seen in the left axilla.  IMPRESSION: Increased diffuse interstitial infiltrates/edema and small bilateral pleural effusions, suspicious for congestive heart failure.  Stable cardiomegaly and COPD.   Electronically Signed   By: Earle Gell M.D.   On: 02/22/2015 14:47   Dg Chest 2 View  02/18/2015  CLINICAL DATA:  Fever  EXAM: CHEST  2 VIEW  COMPARISON:  09/15/2014  FINDINGS: Chronic cardiomegaly. Stable mild aortic tortuosity when accounting for rotation. Chronic interstitial coarsening and hyperinflation, bronchitic and emphysematous disease based on 12/19/2013 chest CT. No superimposed consolidation. Small bilateral pleural effusions are present. Status post left breast and axillary surgery.  IMPRESSION: COPD and trace bilateral pleural effusion. No pneumonia to explain the history of fever.   Electronically Signed   By: Monte Fantasia M.D.   On: 02/18/2015 03:23   Ct Head Wo Contrast  02/18/2015   CLINICAL DATA:  Blurred vision, generalized weakness.  EXAM: CT HEAD WITHOUT CONTRAST  TECHNIQUE: Contiguous axial images were obtained from the base of the skull through the vertex without intravenous contrast.  COMPARISON:  CT scan of September 15, 2014.  FINDINGS: Bony calvarium appears intact. Mild diffuse cortical atrophy is noted. Mild chronic ischemic white matter  disease is noted. Old lacunar infarctions are noted in the right basal ganglia which are unchanged compared to prior exam. No mass effect or midline shift is noted. Ventricular size is within normal limits. There is no evidence of mass lesion, hemorrhage or acute infarction.  IMPRESSION: Mild diffuse cortical atrophy. Mild chronic ischemic white matter disease. No acute intracranial abnormality seen.   Electronically Signed   By: Marijo Conception, M.D.   On: 02/18/2015 08:45    Microbiology: Recent Results (from the past 240 hour(s))  Blood culture (routine x 2)     Status: None   Collection Time: 02/18/15  2:41 AM  Result Value Ref Range Status   Specimen Description BLOOD BLOOD RIGHT FOREARM  Final   Special Requests BOTTLES DRAWN AEROBIC AND ANAEROBIC 5CC  Final   Culture   Final    NO GROWTH 5 DAYS Performed at [facility]    Report Status 02/23/2015 FINAL  Final  Urine culture     Status: None   Collection Time: 02/18/15  2:43 AM  Result Value Ref Range Status   Specimen Description URINE, CLEAN CATCH  Final   Special Requests NONE  Final   Culture   Final    NO GROWTH 1 DAY Performed at [facility]    Report Status 02/19/2015 FINAL  Final  Blood culture (routine x 2)     Status: None   Collection Time: 02/18/15  2:46 AM  Result Value Ref Range Status   Specimen Description BLOOD RIGHT ANTECUBITAL  Final   Special Requests BOTTLES DRAWN AEROBIC AND ANAEROBIC 5CC  Final   Culture   Final    NO GROWTH 5 DAYS Performed at [facility]    Report Status 02/23/2015 FINAL  Final  MRSA PCR Screening     Status: None   Collection Time: 02/19/15  8:16 PM  Result Value Ref Range Status   MRSA by PCR NEGATIVE NEGATIVE Final    Comment:        The GeneXpert MRSA Assay (FDA approved for NASAL specimens only), is one component of a comprehensive MRSA colonization surveillance program. It is not intended to diagnose MRSA infection nor to guide or monitor  treatment for MRSA infections.      Labs: Basic Metabolic Panel:  Recent Labs Lab 02/20/15 0225 02/21/15 0813 02/22/15 0339 02/22/15 1015 02/23/15 0249  NA 136 137 138 137 136  K 4.4 4.1 4.8 3.5 3.8  CL 107 106 107 104 103  CO2 22 21* 23 24 25   GLUCOSE 146* 144* 98 111*  113*  BUN 15 25* 25* 21* 24*  CREATININE 0.55 0.74 0.70 0.62 0.65  CALCIUM 8.5* 8.6* 8.5* 8.2* 8.0*  MG  --  1.9  --   --   --    Liver Function Tests:  Recent Labs Lab 02/21/15 0813 02/22/15 0339 02/23/15 0249  AST 53* 43* 24  ALT 59* 50 34  ALKPHOS 74 69 62  BILITOT 2.0* 2.1* 2.1*  PROT 6.4* 6.1* 6.4*  ALBUMIN 2.6* 2.5* 2.3*   No results for input(s): LIPASE, AMYLASE in the last 168 hours. No results for input(s): AMMONIA in the last 168 hours. CBC:  Recent Labs Lab 02/19/15 1330 02/20/15 0225 02/21/15 0813 02/22/15 1015 02/23/15 0249  WBC 13.4* 9.8 13.1* 13.0* 12.5*  NEUTROABS  --   --  9.2*  --  5.6  HGB 9.9* 10.2* 10.6* 9.1* 9.4*  HCT 29.8* 30.8* 31.2* 27.5* 28.9*  MCV 92.0 92.5 92.0 93.9 93.8  PLT 29* 30* 32* 23* 24*   Cardiac Enzymes:  Recent Labs Lab 02/19/15 1935 02/20/15 0225 02/20/15 0645  TROPONINI <0.03 <0.03 0.04*   BNP: BNP (last 3 results) No results for input(s): BNP in the last 8760 hours.  ProBNP (last 3 results) No results for input(s): PROBNP in the last 8760 hours.  CBG: No results for input(s): GLUCAP in the last 168 hours.     SignedNita Sells  [facility] 02/25/2015, 9:05 AM

## 2015-02-26 ENCOUNTER — Ambulatory Visit: Payer: Medicare Other | Admitting: Hematology and Oncology

## 2015-02-26 ENCOUNTER — Telehealth: Payer: Self-pay | Admitting: Hematology and Oncology

## 2015-02-26 ENCOUNTER — Telehealth: Payer: Self-pay | Admitting: *Deleted

## 2015-02-26 ENCOUNTER — Other Ambulatory Visit: Payer: Medicare Other

## 2015-02-26 NOTE — Telephone Encounter (Signed)
Pt d/c'd from hospital yesterday to SNF at [facility] yesterday.  Daughter, Tonya Mckee,  Say she was unaware of pt having any appts here today or tomorrow.  She is on the way to see pt now at [facility] and will see if they can arrange for pt to come here tomorrow morning or Monday.  She will call us back to let us know.

## 2015-02-26 NOTE — Telephone Encounter (Signed)
Dawn states they will come on Monday 7/18 for lab/MD/transfusion.    I will send urgent POF to scheduler.  Lab at 10:45 am,  MD at 11:15 am and transfusion afterwards.    Dawn verbalized understanding.

## 2015-02-26 NOTE — Telephone Encounter (Signed)
Added appt per pof...per orders pof pt aware °

## 2015-02-28 ENCOUNTER — Other Ambulatory Visit: Payer: Self-pay | Admitting: Radiology

## 2015-03-02 ENCOUNTER — Ambulatory Visit (HOSPITAL_BASED_OUTPATIENT_CLINIC_OR_DEPARTMENT_OTHER): Payer: Medicare Other | Admitting: Hematology and Oncology

## 2015-03-02 ENCOUNTER — Telehealth: Payer: Self-pay | Admitting: Hematology and Oncology

## 2015-03-02 ENCOUNTER — Telehealth: Payer: Self-pay | Admitting: *Deleted

## 2015-03-02 ENCOUNTER — Other Ambulatory Visit (HOSPITAL_BASED_OUTPATIENT_CLINIC_OR_DEPARTMENT_OTHER): Payer: Medicare Other

## 2015-03-02 ENCOUNTER — Encounter: Payer: Self-pay | Admitting: Hematology and Oncology

## 2015-03-02 VITALS — BP 125/53 | HR 53 | Temp 97.6°F | Resp 18 | Ht 61.0 in | Wt 114.4 lb

## 2015-03-02 DIAGNOSIS — D6959 Other secondary thrombocytopenia: Secondary | ICD-10-CM | POA: Diagnosis not present

## 2015-03-02 DIAGNOSIS — D630 Anemia in neoplastic disease: Secondary | ICD-10-CM

## 2015-03-02 DIAGNOSIS — D46C Myelodysplastic syndrome with isolated del(5q) chromosomal abnormality: Secondary | ICD-10-CM | POA: Diagnosis not present

## 2015-03-02 DIAGNOSIS — T50905A Adverse effect of unspecified drugs, medicaments and biological substances, initial encounter: Secondary | ICD-10-CM

## 2015-03-02 DIAGNOSIS — I495 Sick sinus syndrome: Secondary | ICD-10-CM

## 2015-03-02 LAB — CBC WITH DIFFERENTIAL/PLATELET
HCT: 28 % — ABNORMAL LOW (ref 34.8–46.6)
HGB: 8.9 g/dL — ABNORMAL LOW (ref 11.6–15.9)
MCH: 29.6 pg (ref 25.1–34.0)
MCHC: 31.9 g/dL (ref 31.5–36.0)
MCV: 92.8 fL (ref 79.5–101.0)
Platelets: 50 10*3/uL — ABNORMAL LOW (ref 145–400)
RBC: 3.02 10*6/uL — ABNORMAL LOW (ref 3.70–5.45)
RDW: 20.8 % — ABNORMAL HIGH (ref 11.2–14.5)
WBC: 11.5 10*3/uL — ABNORMAL HIGH (ref 3.9–10.3)

## 2015-03-02 LAB — MANUAL DIFFERENTIAL
ALC: 2.6 10*3/uL (ref 0.9–3.3)
ANC (CHCC manual diff): 5.5 10*3/uL (ref 1.5–6.5)
BAND NEUTROPHILS: 13 % — AB (ref 0–10)
Basophil: 2 % (ref 0–2)
Blasts: 2 % — ABNORMAL HIGH (ref 0–0)
EOS: 0 % (ref 0–7)
LYMPH: 23 % (ref 14–49)
MONO: 25 % — ABNORMAL HIGH (ref 0–14)
Metamyelocytes: 5 % — ABNORMAL HIGH (ref 0–0)
Myelocytes: 3 % — ABNORMAL HIGH (ref 0–0)
OTHER CELL: 0 % (ref 0–0)
PLT EST: DECREASED
PROMYELO: 0 % (ref 0–0)
SEG: 27 % — ABNORMAL LOW (ref 38–77)
Variant Lymph: 0 % (ref 0–0)
nRBC: 2 % — ABNORMAL HIGH (ref 0–0)

## 2015-03-02 LAB — HOLD TUBE, BLOOD BANK

## 2015-03-02 NOTE — Telephone Encounter (Signed)
Per staff message and POF I have scheduled appts. Advised scheduler of appts. JMW  

## 2015-03-02 NOTE — Assessment & Plan Note (Signed)
She has extensive evaluation while hospitalized. She is not a candidate for anticoagulation therapy due to chronic thrombocytopenia. Today, clinical examination revealed normal sinus rhythm and she is not symptomatic  Continue medical management.

## 2015-03-02 NOTE — Assessment & Plan Note (Signed)
This is likely anemia of chronic disease and related to her bone marrow disorder. The patient denies recent history of bleeding such as epistaxis, hematuria or hematochezia. She will get 2 units of blood whenever hemoglobin dropped to less than 8 g

## 2015-03-02 NOTE — Assessment & Plan Note (Addendum)
She has profound bone marrow suppression and pancytopenia due to recent dental infection. At present time, she will continue to hold Revlimid and remain on prednisone. We will continue transfusion support and blood work monitoring weekly. I'll see her back in 2 weeks to reassess and possibly resume Revlimid if her blood counts improved. I recommend port placement due to poor venous access I will give her platelet transfusion prior to port placement due to low platelet count The port placement was deferred recently due to recent infection.  in the meantime, she will continue prednisone. I gave the daughter paperwork to direct the nursing home to reduce prednisone from 60 to10 mg daily

## 2015-03-02 NOTE — Assessment & Plan Note (Signed)
This is likely due to recent treatment. The patient denies recent history of bleeding such as epistaxis, hematuria or hematochezia. She is asymptomatic from the low platelet count. I will observe for now.  She will need pre-operative platelet transfusion before port placement

## 2015-03-02 NOTE — Telephone Encounter (Signed)
per pof to sch pt appt-sent MW email to sch pt blood-pt aware-gave pt copy of avs

## 2015-03-02 NOTE — Progress Notes (Signed)
[place] OFFICE PROGRESS NOTE  Patient Care Team: Carlos Levering, PA-C as PCP - General (Family Medicine) Lennon Alstrom, MD (Neurology) Carlos Levering, PA-C as Referring Physician (Family Medicine) Heath Lark, MD as Consulting Physician (Hematology and Oncology)  SUMMARY OF ONCOLOGIC HISTORY:  She was found to have abnormal CBC from routine blood work. Starting around 2012, she has progressive anemia with hemoglobin and the lowest at 9.1.  She denies recent chest pain on exertion but complains of significant shortness of breath on minimal exertion. She has had pre-syncopal episodes and palpitations. She has profound fatigue and takes frequent naps. Recently, she had a fall.  Of note, the patient has 60 pack plus year history and recently was placed on oxygen therapy.  She never has EGD or screening colonoscopy.  She has been taking vitamin B12 supplements for a long time.  She has remote history of left breast cancer detected from mammogram. According to the patient, she had lumpectomy followed by radiation followed by tamoxifen. She does not know the stage of disease but she does not think the lymph nodes were involved.  The patient was prescribed oral iron supplements and she takes them regularly.  On 12/27/13: Bone marrow biopsy did not show evidence of leukemia or metastatic cancer. Cytogenetics and FISH analysis confirm myelodysplastic syndrome with deletion 5Q. The patient was started on erythropoietin stimulating agent to keep hemoglobin greater than 10 g. On 04/15/14: CT head was negative On 04/24/2014, we discontinued darbepoetin as the patient complained of side effects. She was started on prednisone 10 mg daily. On 05/22/14: Prednisone is tapered to 5 mg. On 06/16/2014, she started on Revlimid 2.5 mg daily. From 09/15/2014 to 09/17/2014, she was hospitalized due to acute fall. She was subsequently discharged to rehabilitation facility and her treatment was placed on  hold. On 10/14/2014, she received 1 L IV fluids due to weakness and hypotension On 10/20/2014, she resume Revlimid 2.5 mg daily along with 2.5 mg of prednisone On 12/09/2014, she had dental extraction for dental abscess On 01/30/15: she had complete dental extraction of all remaining teeth From 02/17/2015 to 02/25/2015, she was admitted to the hospital for severe weakness, tachycardia/bradycardia/sick sinus syndrome, severe anemia and thrombocytopenia and dental infection.  INTERVAL HISTORY: Please see below for problem oriented charting.  she complained of feeling weak. Denies fevers or chills. Her jaw pain has improved. She denies any shortness of breath or dizziness. She complained of extensive bruises. The patient denies any recent signs or symptoms of bleeding such as spontaneous epistaxis, hematuria or hematochezia.  REVIEW OF SYSTEMS:   Constitutional: Denies fevers, chills or abnormal weight loss Eyes: Denies blurriness of vision Ears, nose, mouth, throat, and face: Denies mucositis or sore throat Respiratory: Denies cough, dyspnea or wheezes Cardiovascular: Denies palpitation, chest discomfort or lower extremity swelling Gastrointestinal:  Denies nausea, heartburn or change in bowel habits Skin: Denies abnormal skin rashes Lymphatics: Denies new lymphadenopathy  Neurological:Denies numbness, tingling or new weaknesses Behavioral/Psych: Mood is stable, no new changes  All other systems were reviewed with the patient and are negative.  I have reviewed the past medical history, past surgical history, social history and family history with the patient and they are unchanged from previous note.  ALLERGIES:  is allergic to donepezil; penicillins; amoxapine and related; doxycycline; keflex; bupropion; cardizem; ketek; levaquin; sulfa antibiotics; tramadol; and zyrtec.  MEDICATIONS:  Current Outpatient Prescriptions  Medication Sig Dispense Refill  . acetaminophen-codeine (TYLENOL  #3) 300-30 MG per tablet Take 1-2  tablets by mouth every 4 (four) hours as needed. pain  0  . albuterol (PROVENTIL HFA;VENTOLIN HFA) 108 (90 BASE) MCG/ACT inhaler Inhale 2 puffs into the lungs every 6 (six) hours as needed for wheezing or shortness of breath (wheezing).     Marland Kitchen amLODipine (NORVASC) 5 MG tablet Take 1 tablet (5 mg total) by mouth daily. 30 tablet 0  . calcium carbonate (TUMS - DOSED IN MG ELEMENTAL CALCIUM) 500 MG chewable tablet Chew 1 tablet by mouth daily as needed for indigestion or heartburn.    . chlorhexidine (PERIDEX) 0.12 % solution 15 mLs by Mouth Rinse route 2 (two) times daily. 120 mL 0  . Cholecalciferol (VITAMIN D) 2000 UNITS tablet Take 2,000 Units by mouth daily.    . digoxin (LANOXIN) 0.125 MG tablet Take 1 tablet (0.125 mg total) by mouth daily. 30 tablet 0  . famotidine (PEPCID AC) 10 MG tablet Take 1 tablet (10 mg total) by mouth 2 (two) times daily. 30 tablet 0  . FLUoxetine (PROZAC) 10 MG capsule Take 3 capsules (30 mg total) by mouth every morning. 30 capsule 0  . ipratropium-albuterol (DUONEB) 0.5-2.5 (3) MG/3ML SOLN Take 3 mLs by nebulization 2 (two) times daily as needed (wheezing). 360 mL 0  . metoprolol succinate (TOPROL-XL) 50 MG 24 hr tablet Take 50 mg by mouth every evening. Take with or immediately following a meal.    . predniSONE (DELTASONE) 20 MG tablet Take 3 tablets (60 mg total) by mouth daily before breakfast. 15 tablet 0  . tiotropium (SPIRIVA) 18 MCG inhalation capsule Place 18 mcg into inhaler and inhale daily.    . valsartan (DIOVAN) 40 MG tablet Take 1 tablet by mouth daily.  2  . acetaminophen (TYLENOL) 325 MG tablet Take 650 mg by mouth every 6 (six) hours as needed for moderate pain (pain).     . LORazepam (ATIVAN) 0.5 MG tablet Take 1 tablet (0.5 mg total) by mouth every 8 (eight) hours as needed for anxiety (anxiety). (Patient not taking: Reported on 03/02/2015) 30 tablet 0  . menthol-cetylpyridinium (CEPACOL) 3 MG lozenge Take 1 lozenge  (3 mg total) by mouth as needed for sore throat. (Patient not taking: Reported on 02/18/2015) 30 tablet 0  . ondansetron (ZOFRAN) 4 MG tablet Take 1 tablet (4 mg total) by mouth every 6 (six) hours as needed for nausea. (Patient not taking: Reported on 02/18/2015) 20 tablet 0  . polyethylene glycol (MIRALAX / GLYCOLAX) packet Take 17 g by mouth daily. (Patient not taking: Reported on 03/02/2015) 14 each 0  . sorbitol 70 % SOLN Take 30 mLs by mouth daily as needed for moderate constipation. (Patient not taking: Reported on 03/02/2015) 473 mL 0   No current facility-administered medications for this visit.    PHYSICAL EXAMINATION: ECOG PERFORMANCE STATUS: 2 - Symptomatic, <50% confined to bed  Filed Vitals:   03/02/15 1119  BP: 125/53  Pulse: 53  Temp: 97.6 F (36.4 C)  Resp: 18   Filed Weights   03/02/15 1119  Weight: 114 lb 6.4 oz (51.891 kg)    GENERAL:alert, no distress and comfortable. She looks thin and debilitated SKIN: skin color is pale, texture, turgor are normal, no rashes or significant lesions. Extensive bruises are noted. EYES: normal, Conjunctiva are pale and non-injected, sclera clear OROPHARYNX:no exudate, no erythema and lips, buccal mucosa, and tongue normal  NECK: supple, thyroid normal size, non-tender, without nodularity LYMPH:  no palpable lymphadenopathy in the cervical, axillary or inguinal LUNGS: clear to auscultation  and percussion with normal breathing effort HEART: regular rate & rhythm and no murmurs and no lower extremity edema ABDOMEN:abdomen soft, non-tender and normal bowel sounds Musculoskeletal:no cyanosis of digits and no clubbing  NEURO: alert & oriented x 3 with fluent speech, no focal motor/sensory deficits  LABORATORY DATA:  I have reviewed the data as listed    Component Value Date/Time   NA 136 02/23/2015 0249   NA 142 02/12/2015 1141   K 3.8 02/23/2015 0249   K 4.9 02/12/2015 1141   CL 103 02/23/2015 0249   CO2 25 02/23/2015 0249   CO2  25 02/12/2015 1141   GLUCOSE 113* 02/23/2015 0249   GLUCOSE 102 02/12/2015 1141   BUN 24* 02/23/2015 0249   BUN 18.6 02/12/2015 1141   CREATININE 0.65 02/23/2015 0249   CREATININE 0.9 02/12/2015 1141   CALCIUM 8.0* 02/23/2015 0249   CALCIUM 8.9 02/12/2015 1141   PROT 6.4* 02/23/2015 0249   PROT 6.5 02/12/2015 1141   ALBUMIN 2.3* 02/23/2015 0249   ALBUMIN 3.2* 02/12/2015 1141   AST 24 02/23/2015 0249   AST 12 02/12/2015 1141   ALT 34 02/23/2015 0249   ALT 6 02/12/2015 1141   ALKPHOS 62 02/23/2015 0249   ALKPHOS 61 02/12/2015 1141   BILITOT 2.1* 02/23/2015 0249   BILITOT 0.67 02/12/2015 1141   GFRNONAA >60 02/23/2015 0249   GFRAA >60 02/23/2015 0249    No results found for: SPEP, UPEP  Lab Results  Component Value Date   WBC 11.5* 03/02/2015   NEUTROABS 5.6 02/23/2015   HGB 8.9* 03/02/2015   HCT 28.0* 03/02/2015   MCV 92.8 03/02/2015   PLT 50* 03/02/2015      Chemistry      Component Value Date/Time   NA 136 02/23/2015 0249   NA 142 02/12/2015 1141   K 3.8 02/23/2015 0249   K 4.9 02/12/2015 1141   CL 103 02/23/2015 0249   CO2 25 02/23/2015 0249   CO2 25 02/12/2015 1141   BUN 24* 02/23/2015 0249   BUN 18.6 02/12/2015 1141   CREATININE 0.65 02/23/2015 0249   CREATININE 0.9 02/12/2015 1141      Component Value Date/Time   CALCIUM 8.0* 02/23/2015 0249   CALCIUM 8.9 02/12/2015 1141   ALKPHOS 62 02/23/2015 0249   ALKPHOS 61 02/12/2015 1141   AST 24 02/23/2015 0249   AST 12 02/12/2015 1141   ALT 34 02/23/2015 0249   ALT 6 02/12/2015 1141   BILITOT 2.1* 02/23/2015 0249   BILITOT 0.67 02/12/2015 1141      ASSESSMENT & PLAN:  MDS (myelodysplastic syndrome) with 5q deletion She has profound bone marrow suppression and pancytopenia due to recent dental infection. At present time, she will continue to hold Revlimid and remain on prednisone. We will continue transfusion support and blood work monitoring weekly. I'll see her back in 2 weeks to reassess and  possibly resume Revlimid if her blood counts improved. I recommend port placement due to poor venous access I will give her platelet transfusion prior to port placement due to low platelet count The port placement was deferred recently due to recent infection.  in the meantime, she will continue prednisone. I gave the daughter paperwork to direct the nursing home to reduce prednisone from 60 to10 mg daily  Anemia in neoplastic disease This is likely anemia of chronic disease and related to her bone marrow disorder. The patient denies recent history of bleeding such as epistaxis, hematuria or hematochezia. She will get 2 units  of blood whenever hemoglobin dropped to less than 8 g  Thrombocytopenia due to drugs This is likely due to recent treatment. The patient denies recent history of bleeding such as epistaxis, hematuria or hematochezia. She is asymptomatic from the low platelet count. I will observe for now.  She will need pre-operative platelet transfusion before port placement     Tachycardia-bradycardia  She has extensive evaluation while hospitalized. She is not a candidate for anticoagulation therapy due to chronic thrombocytopenia. Today, clinical examination revealed normal sinus rhythm and she is not symptomatic  Continue medical management.   No orders of the defined types were placed in this encounter.   All questions were answered. The patient knows to call the clinic with any problems, questions or concerns. No barriers to learning was detected. I spent 25 minutes counseling the patient face to face. The total time spent in the appointment was 30 minutes and more than 50% was on counseling and review of test results     [facility], Devorah Givhan, MD 03/02/2015 1:10 PM

## 2015-03-03 ENCOUNTER — Inpatient Hospital Stay (HOSPITAL_COMMUNITY): Admit: 2015-03-03 | Payer: Medicare Other

## 2015-03-03 ENCOUNTER — Ambulatory Visit (HOSPITAL_COMMUNITY): Admit: 2015-03-03 | Payer: Medicare Other

## 2015-03-04 IMAGING — CR DG LUMBAR SPINE COMPLETE 4+V
5 series · 5 of 5 positions shown · non-contrast
Comparison: CT abdomen and pelvis 12/19/2013.

CLINICAL DATA: Status post fall out of bed today.  Back pain.

EXAM:
LUMBAR SPINE - COMPLETE 4+ VIEW

[t lumbar spine ap]
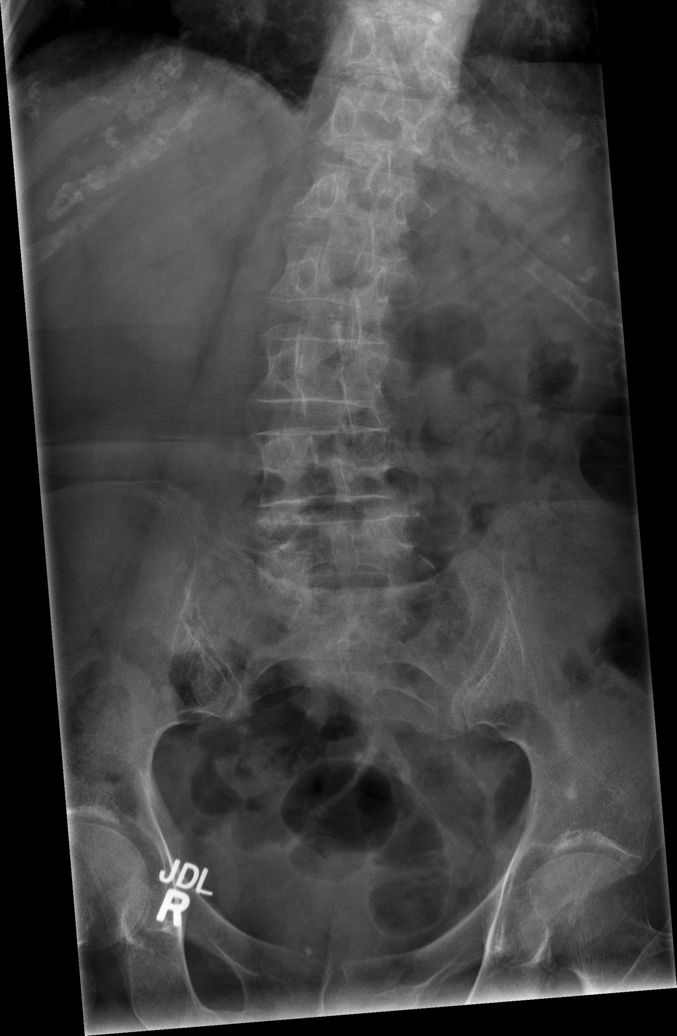

[t lumbar spine obl (1 of 2)]
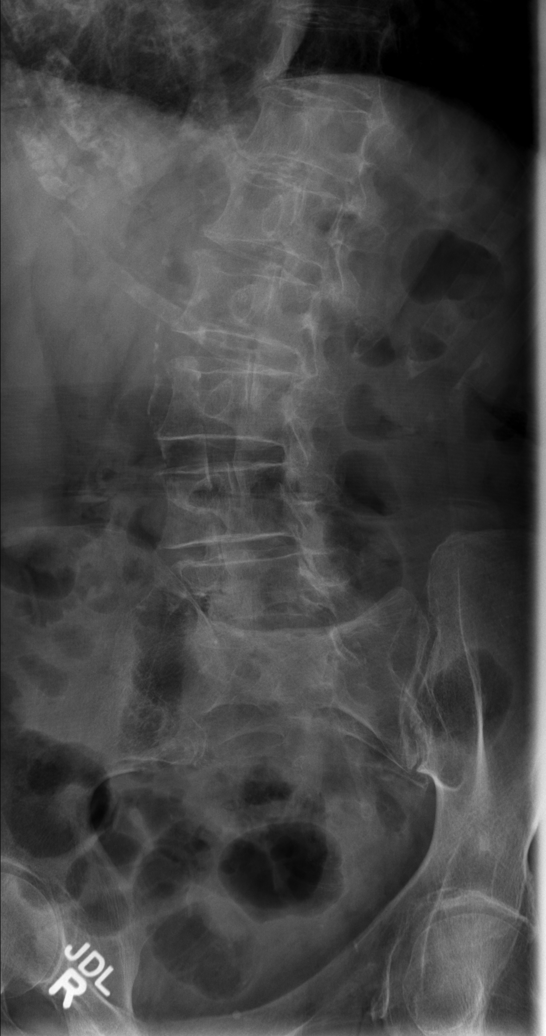

[t lumbar spine obl (2 of 2)]
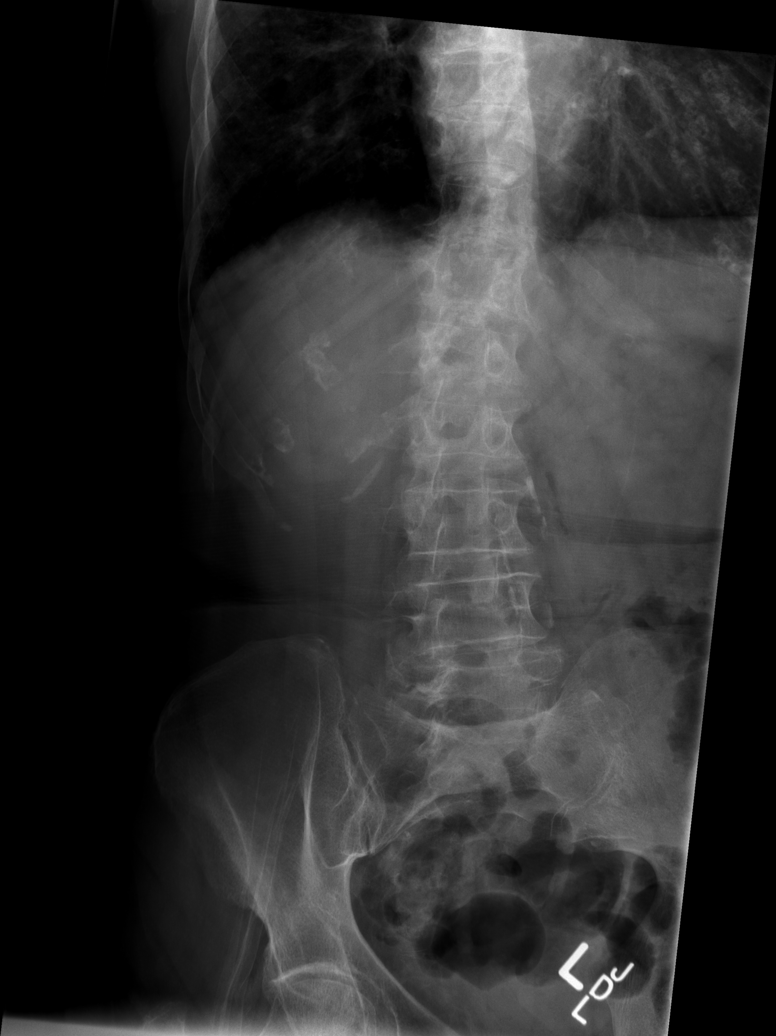

[t lumbar spine lat]
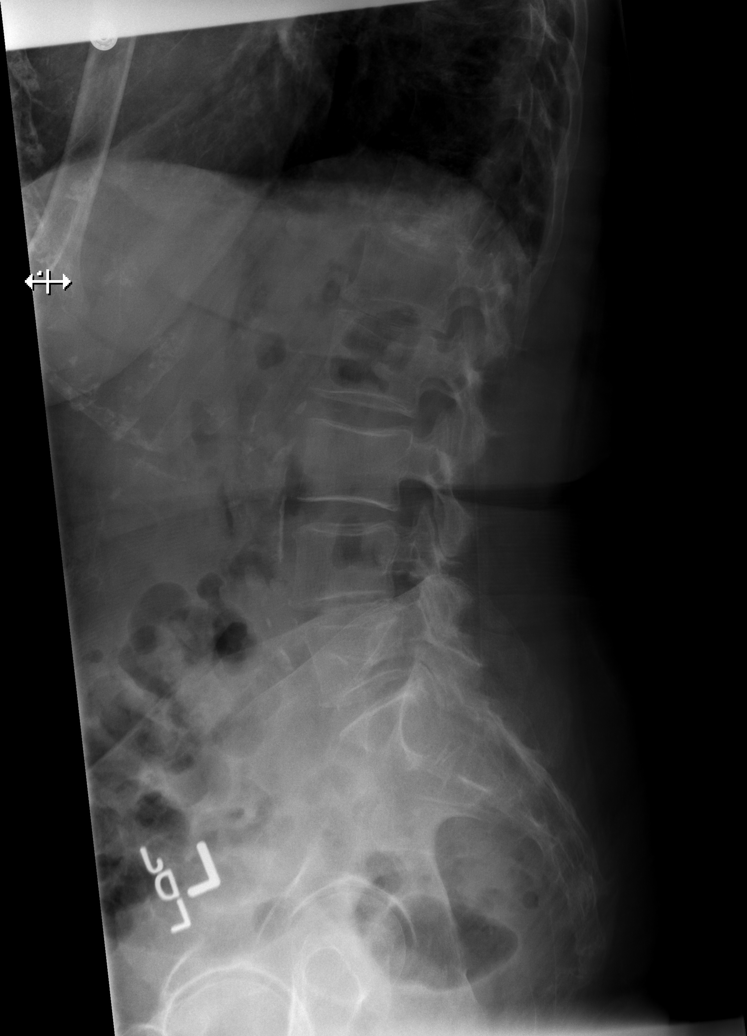

[t lumbar l-5 s-1 spot]
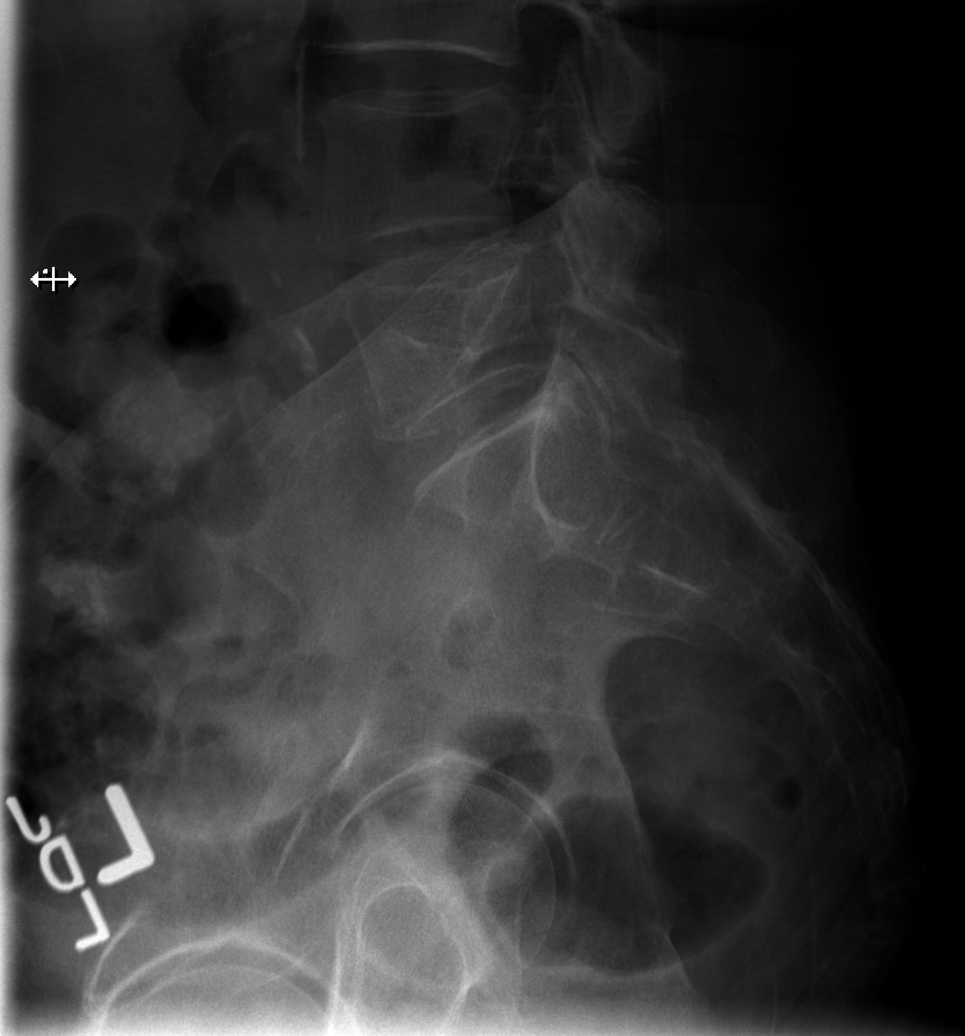

[5 of 5 positions shown; findings below may reference images not displayed]

FINDINGS: Convex right scoliosis is again seen. There is no fracture.
Alignment is normal. Facet degenerative disease is most notable at
L4-5 and L5-S1.
IMPRESSION: No acute abnormality.

## 2015-03-05 ENCOUNTER — Other Ambulatory Visit: Payer: Self-pay

## 2015-03-05 ENCOUNTER — Inpatient Hospital Stay (HOSPITAL_COMMUNITY)
Admission: EM | Admit: 2015-03-05 | Discharge: 2015-03-13 | DRG: 871 | Disposition: A | Discharge status: Hospice | Payer: Medicare Other | Attending: Internal Medicine | Admitting: Internal Medicine

## 2015-03-05 ENCOUNTER — Other Ambulatory Visit (HOSPITAL_COMMUNITY): Payer: Self-pay

## 2015-03-05 ENCOUNTER — Emergency Department (HOSPITAL_COMMUNITY): Payer: Medicare Other

## 2015-03-05 ENCOUNTER — Encounter (HOSPITAL_COMMUNITY): Payer: Self-pay | Admitting: Emergency Medicine

## 2015-03-05 DIAGNOSIS — R519 Headache, unspecified: Secondary | ICD-10-CM | POA: Diagnosis present

## 2015-03-05 DIAGNOSIS — R0600 Dyspnea, unspecified: Secondary | ICD-10-CM | POA: Diagnosis not present

## 2015-03-05 DIAGNOSIS — R0781 Pleurodynia: Secondary | ICD-10-CM

## 2015-03-05 DIAGNOSIS — D61818 Other pancytopenia: Secondary | ICD-10-CM | POA: Diagnosis present

## 2015-03-05 DIAGNOSIS — E871 Hypo-osmolality and hyponatremia: Secondary | ICD-10-CM | POA: Diagnosis present

## 2015-03-05 DIAGNOSIS — Z9889 Other specified postprocedural states: Secondary | ICD-10-CM | POA: Insufficient documentation

## 2015-03-05 DIAGNOSIS — A419 Sepsis, unspecified organism: Principal | ICD-10-CM | POA: Diagnosis present

## 2015-03-05 DIAGNOSIS — D630 Anemia in neoplastic disease: Secondary | ICD-10-CM | POA: Diagnosis present

## 2015-03-05 DIAGNOSIS — M1990 Unspecified osteoarthritis, unspecified site: Secondary | ICD-10-CM | POA: Diagnosis present

## 2015-03-05 DIAGNOSIS — Z87891 Personal history of nicotine dependence: Secondary | ICD-10-CM | POA: Diagnosis not present

## 2015-03-05 DIAGNOSIS — E876 Hypokalemia: Secondary | ICD-10-CM | POA: Diagnosis not present

## 2015-03-05 DIAGNOSIS — Z7952 Long term (current) use of systemic steroids: Secondary | ICD-10-CM | POA: Diagnosis not present

## 2015-03-05 DIAGNOSIS — Y95 Nosocomial condition: Secondary | ICD-10-CM | POA: Diagnosis present

## 2015-03-05 DIAGNOSIS — Z515 Encounter for palliative care: Secondary | ICD-10-CM | POA: Diagnosis not present

## 2015-03-05 DIAGNOSIS — F0390 Unspecified dementia without behavioral disturbance: Secondary | ICD-10-CM | POA: Diagnosis present

## 2015-03-05 DIAGNOSIS — Z7189 Other specified counseling: Secondary | ICD-10-CM

## 2015-03-05 DIAGNOSIS — R51 Headache: Secondary | ICD-10-CM

## 2015-03-05 DIAGNOSIS — I771 Stricture of artery: Secondary | ICD-10-CM | POA: Diagnosis present

## 2015-03-05 DIAGNOSIS — J9620 Acute and chronic respiratory failure, unspecified whether with hypoxia or hypercapnia: Secondary | ICD-10-CM | POA: Diagnosis present

## 2015-03-05 DIAGNOSIS — Z9981 Dependence on supplemental oxygen: Secondary | ICD-10-CM

## 2015-03-05 DIAGNOSIS — I209 Angina pectoris, unspecified: Secondary | ICD-10-CM | POA: Diagnosis not present

## 2015-03-05 DIAGNOSIS — J449 Chronic obstructive pulmonary disease, unspecified: Secondary | ICD-10-CM | POA: Diagnosis not present

## 2015-03-05 DIAGNOSIS — I10 Essential (primary) hypertension: Secondary | ICD-10-CM | POA: Diagnosis present

## 2015-03-05 DIAGNOSIS — K219 Gastro-esophageal reflux disease without esophagitis: Secondary | ICD-10-CM | POA: Diagnosis present

## 2015-03-05 DIAGNOSIS — I248 Other forms of acute ischemic heart disease: Secondary | ICD-10-CM | POA: Diagnosis present

## 2015-03-05 DIAGNOSIS — R079 Chest pain, unspecified: Secondary | ICD-10-CM | POA: Diagnosis present

## 2015-03-05 DIAGNOSIS — I5033 Acute on chronic diastolic (congestive) heart failure: Secondary | ICD-10-CM | POA: Diagnosis present

## 2015-03-05 DIAGNOSIS — D696 Thrombocytopenia, unspecified: Secondary | ICD-10-CM | POA: Diagnosis present

## 2015-03-05 DIAGNOSIS — D6959 Other secondary thrombocytopenia: Secondary | ICD-10-CM | POA: Diagnosis present

## 2015-03-05 DIAGNOSIS — M549 Dorsalgia, unspecified: Secondary | ICD-10-CM | POA: Diagnosis present

## 2015-03-05 DIAGNOSIS — Z66 Do not resuscitate: Secondary | ICD-10-CM | POA: Diagnosis present

## 2015-03-05 DIAGNOSIS — E46 Unspecified protein-calorie malnutrition: Secondary | ICD-10-CM | POA: Diagnosis present

## 2015-03-05 DIAGNOSIS — J918 Pleural effusion in other conditions classified elsewhere: Secondary | ICD-10-CM | POA: Diagnosis not present

## 2015-03-05 DIAGNOSIS — J441 Chronic obstructive pulmonary disease with (acute) exacerbation: Secondary | ICD-10-CM | POA: Diagnosis present

## 2015-03-05 DIAGNOSIS — Z6821 Body mass index (BMI) 21.0-21.9, adult: Secondary | ICD-10-CM

## 2015-03-05 DIAGNOSIS — Z8673 Personal history of transient ischemic attack (TIA), and cerebral infarction without residual deficits: Secondary | ICD-10-CM | POA: Diagnosis not present

## 2015-03-05 DIAGNOSIS — D46C Myelodysplastic syndrome with isolated del(5q) chromosomal abnormality: Secondary | ICD-10-CM | POA: Diagnosis present

## 2015-03-05 DIAGNOSIS — I480 Paroxysmal atrial fibrillation: Secondary | ICD-10-CM | POA: Diagnosis not present

## 2015-03-05 DIAGNOSIS — I4891 Unspecified atrial fibrillation: Secondary | ICD-10-CM | POA: Diagnosis present

## 2015-03-05 DIAGNOSIS — E43 Unspecified severe protein-calorie malnutrition: Secondary | ICD-10-CM | POA: Diagnosis present

## 2015-03-05 DIAGNOSIS — I495 Sick sinus syndrome: Secondary | ICD-10-CM | POA: Diagnosis present

## 2015-03-05 DIAGNOSIS — J189 Pneumonia, unspecified organism: Secondary | ICD-10-CM | POA: Diagnosis present

## 2015-03-05 LAB — BASIC METABOLIC PANEL
ANION GAP: 9 (ref 5–15)
BUN: 22 mg/dL — ABNORMAL HIGH (ref 6–20)
CALCIUM: 8 mg/dL — AB (ref 8.9–10.3)
CO2: 28 mmol/L (ref 22–32)
CREATININE: 0.85 mg/dL (ref 0.44–1.00)
Chloride: 96 mmol/L — ABNORMAL LOW (ref 101–111)
GFR calc Af Amer: >60 mL/min (ref >60)
GFR calc non Af Amer: >60 mL/min (ref >60)
GLUCOSE: 123 mg/dL — AB (ref 65–99)
Potassium: 3.8 mmol/L (ref 3.5–5.1)
Sodium: 133 mmol/L — ABNORMAL LOW (ref 135–145)

## 2015-03-05 LAB — CBC
HCT: 23.1 % — ABNORMAL LOW (ref 36.0–46.0)
Hemoglobin: 7.6 g/dL — ABNORMAL LOW (ref 12.0–15.0)
MCH: 30.2 pg (ref 26.0–34.0)
MCHC: 32.9 g/dL (ref 30.0–36.0)
MCV: 91.7 fL (ref 78.0–100.0)
Platelets: 33 10*3/uL — ABNORMAL LOW (ref 150–400)
RBC: 2.52 MIL/uL — ABNORMAL LOW (ref 3.87–5.11)
RDW: 20.4 % — ABNORMAL HIGH (ref 11.5–15.5)
WBC: 21.4 10*3/uL — AB (ref 4.0–10.5)

## 2015-03-05 LAB — TROPONIN I: TROPONIN I: 0.05 ng/mL — AB (ref <0.031)

## 2015-03-05 LAB — APTT: aPTT: 32 seconds (ref 24–37)

## 2015-03-05 LAB — BRAIN NATRIURETIC PEPTIDE: B NATRIURETIC PEPTIDE 5: 413.2 pg/mL — AB (ref 0.0–100.0)

## 2015-03-05 LAB — PREPARE RBC (CROSSMATCH)

## 2015-03-05 LAB — PROTIME-INR
INR: 1.25 (ref 0.00–1.49)
PROTHROMBIN TIME: 15.8 s — AB (ref 11.6–15.2)

## 2015-03-05 LAB — MRSA PCR SCREENING: MRSA BY PCR: NEGATIVE

## 2015-03-05 LAB — I-STAT TROPONIN, ED: Troponin i, poc: 0 ng/mL (ref 0.00–0.08)

## 2015-03-05 MED ORDER — POLYETHYLENE GLYCOL 3350 17 G PO PACK
17.0000 g | PACK | Freq: Every day | ORAL | Status: DC
  Administered 2015-03-06 – 2015-03-12 (×6): 17 g via ORAL
  Filled 2015-03-05 (×8): qty 1

## 2015-03-05 MED ORDER — SODIUM CHLORIDE 0.9 % IV SOLN: Freq: Once | INTRAVENOUS | Status: DC

## 2015-03-05 MED ORDER — FAMOTIDINE 10 MG PO TABS
10.0000 mg | ORAL_TABLET | Freq: Two times a day (BID) | ORAL | Status: DC
  Administered 2015-03-05 – 2015-03-13 (×16): 10 mg via ORAL
  Filled 2015-03-05 (×19): qty 1

## 2015-03-05 MED ORDER — VANCOMYCIN HCL IN DEXTROSE 1-5 GM/200ML-% IV SOLN
1000.0000 mg | Freq: Once | INTRAVENOUS | Status: AC
  Administered 2015-03-05: 1000 mg via INTRAVENOUS
  Filled 2015-03-05: qty 200

## 2015-03-05 MED ORDER — METOPROLOL SUCCINATE ER 50 MG PO TB24
50.0000 mg | ORAL_TABLET | Freq: Every evening | ORAL | Status: DC
  Administered 2015-03-05 – 2015-03-13 (×9): 50 mg via ORAL
  Filled 2015-03-05 (×10): qty 1

## 2015-03-05 MED ORDER — IPRATROPIUM-ALBUTEROL 0.5-2.5 (3) MG/3ML IN SOLN: 3.0000 mL | Freq: Two times a day (BID) | RESPIRATORY_TRACT | Status: DC | PRN

## 2015-03-05 MED ORDER — PREDNISONE 10 MG PO TABS
10.0000 mg | ORAL_TABLET | Freq: Every day | ORAL | Status: DC
  Administered 2015-03-06 – 2015-03-13 (×8): 10 mg via ORAL
  Filled 2015-03-05 (×11): qty 1

## 2015-03-05 MED ORDER — MENTHOL 3 MG MT LOZG
1.0000 | LOZENGE | OROMUCOSAL | Status: DC | PRN
  Filled 2015-03-05: qty 9

## 2015-03-05 MED ORDER — ACETAMINOPHEN 325 MG PO TABS
650.0000 mg | ORAL_TABLET | Freq: Four times a day (QID) | ORAL | Status: DC | PRN
  Administered 2015-03-06 – 2015-03-08 (×6): 650 mg via ORAL
  Filled 2015-03-05 (×6): qty 2

## 2015-03-05 MED ORDER — DIGOXIN 125 MCG PO TABS
0.1250 mg | ORAL_TABLET | Freq: Every day | ORAL | Status: DC
  Administered 2015-03-06 – 2015-03-13 (×8): 0.125 mg via ORAL
  Filled 2015-03-05 (×9): qty 1

## 2015-03-05 MED ORDER — VANCOMYCIN HCL IN DEXTROSE 750-5 MG/150ML-% IV SOLN
750.0000 mg | INTRAVENOUS | Status: DC
  Administered 2015-03-06 – 2015-03-08 (×3): 750 mg via INTRAVENOUS
  Filled 2015-03-05 (×3): qty 150

## 2015-03-05 MED ORDER — TIOTROPIUM BROMIDE MONOHYDRATE 18 MCG IN CAPS
18.0000 ug | ORAL_CAPSULE | Freq: Every day | RESPIRATORY_TRACT | Status: DC
  Administered 2015-03-06 – 2015-03-12 (×6): 18 ug via RESPIRATORY_TRACT
  Filled 2015-03-05 (×2): qty 5

## 2015-03-05 MED ORDER — DEXTROSE 5 % IV SOLN
1.0000 g | Freq: Three times a day (TID) | INTRAVENOUS | Status: DC
  Administered 2015-03-06 – 2015-03-10 (×15): 1 g via INTRAVENOUS
  Filled 2015-03-05 (×18): qty 1

## 2015-03-05 MED ORDER — SORBITOL 70 % SOLN
30.0000 mL | Freq: Every day | Status: DC | PRN
  Administered 2015-03-07: 30 mL via ORAL
  Filled 2015-03-05 (×2): qty 30

## 2015-03-05 MED ORDER — FLUOXETINE HCL 20 MG PO CAPS
30.0000 mg | ORAL_CAPSULE | Freq: Every morning | ORAL | Status: DC
  Administered 2015-03-06 – 2015-03-13 (×8): 30 mg via ORAL
  Filled 2015-03-05 (×8): qty 1

## 2015-03-05 MED ORDER — ENSURE ENLIVE PO LIQD
237.0000 mL | Freq: Every day | ORAL | Status: DC
  Administered 2015-03-06: 237 mL via ORAL

## 2015-03-05 MED ORDER — ENSURE PLUS PO LIQD: 237.0000 mL | Freq: Every day | ORAL | Status: DC

## 2015-03-05 MED ORDER — IPRATROPIUM-ALBUTEROL 0.5-2.5 (3) MG/3ML IN SOLN
3.0000 mL | Freq: Four times a day (QID) | RESPIRATORY_TRACT | Status: DC | PRN
  Administered 2015-03-06: 3 mL via RESPIRATORY_TRACT
  Filled 2015-03-05 (×2): qty 3

## 2015-03-05 NOTE — ED Notes (Signed)
Pt. Is going to x-ray. 

## 2015-03-05 NOTE — ED Notes (Signed)
Report attempted 

## 2015-03-05 NOTE — ED Provider Notes (Signed)
CSN: [id number]     Arrival date & time 03/05/15  1615 History   First MD Initiated Contact with Patient 03/05/15 1617     Chief Complaint  Patient presents with  . Chest Pain     (Consider location/radiation/quality/duration/timing/severity/associated sxs/prior Treatment) HPI Comments: Patient is an 81 year old female with extensive past medical history including COPD, myelodysplasia, anemia. She was recently hospitalized for weakness which was multi-factorial. She was ultimately discharged to an extended care facility. She states she has been experiencing sharp pain in the front of her chest for the past 2 days in the absence of any injury or trauma. She feels somewhat short of breath but denies nausea or diaphoresis. Her pain is worse with palpation and breathing. She is not having any fever or productive cough.  Patient is a 81 y.o. female presenting with chest pain. The history is provided by the patient.  Chest Pain Pain location:  Substernal area Pain quality: sharp   Pain radiates to:  L arm and R arm Pain radiates to the back: no   Pain severity:  Moderate Onset quality:  Sudden Duration:  2 days Timing:  Constant Progression:  Worsening Chronicity:  New Context: breathing   Worsened by:  Nothing tried Ineffective treatments:  None tried   Past Medical History  Diagnosis Date  . COPD (chronic obstructive pulmonary disease)   . Hypertension   . PONV (postoperative nausea and vomiting)   . Dysrhythmia     rapid heart rate  . Stroke 2010    mini stroke  . Pneumonia 2009  . Shortness of breath   . GERD (gastroesophageal reflux disease)     long time ago  . Depression   . DEMENTIA     borderline  . Depression 01/02/2012  . Anemia 01/02/2012  . Artery stenosis 10/30/12    Moderate to severe left extracranial vertebral  . Cancer 20years ago+ 1999    lt breast  left  . SCC (squamous cell carcinoma), leg     Right  LE Dr. [place] Crane  . Arthritis     Osteo-  Spine and  Knees  . Vitamin B12 deficiency   . Vertigo, benign positional   . Memory loss   . Blepharospasm     Left eye  . Venous stasis ulcers 2012    Left LE due to trauma  . Cellulitis Jan 01, 2012    Left UE  . Carotid artery occlusion     Bruit- RIGHT  . Occlusion and stenosis of carotid artery without mention of cerebral infarction 11/06/2012  . Persistent headaches 02/26/2014  . Chronic respiratory failure     on NCO2    Past Surgical History  Procedure Laterality Date  . Abdominal hysterectomy    . Appendectomy    . Breast surgery    . Joint replacement      Lt arm/wrist plates and screws  . Eye surgery      L/R catarects   Family History  Problem Relation Age of Onset  . Adopted: Yes   History  Substance Use Topics  . Smoking status: Former Smoker -- 0.50 packs/day for 68 years    Types: Cigarettes    Quit date: 09/15/2014  . Smokeless tobacco: Never Used  . Alcohol Use: No   OB History    No data available     Review of Systems  Cardiovascular: Positive for chest pain.  All other systems reviewed and are negative.     Allergies  Donepezil; Penicillins; Amoxapine and related; Doxycycline; Keflex; Bupropion; Cardizem; Ketek; Levaquin; Sulfa antibiotics; Tramadol; and Zyrtec  Home Medications   Prior to Admission medications   Medication Sig Start Date End Date Taking? Authorizing Provider  acetaminophen (TYLENOL) 325 MG tablet Take 650 mg by mouth every 6 (six) hours as needed for moderate pain (pain).     Historical Provider, MD  acetaminophen-codeine (TYLENOL #3) 300-30 MG per tablet Take 1-2 tablets by mouth every 4 (four) hours as needed. pain 12/06/14   Historical Provider, MD  albuterol (PROVENTIL HFA;VENTOLIN HFA) 108 (90 BASE) MCG/ACT inhaler Inhale 2 puffs into the lungs every 6 (six) hours as needed for wheezing or shortness of breath (wheezing).     Historical Provider, MD  amLODipine (NORVASC) 5 MG tablet Take 1 tablet (5 mg total) by mouth daily.  02/24/15   Nita Sells, MD  calcium carbonate (TUMS - DOSED IN MG ELEMENTAL CALCIUM) 500 MG chewable tablet Chew 1 tablet by mouth daily as needed for indigestion or heartburn.    Historical Provider, MD  chlorhexidine (PERIDEX) 0.12 % solution 15 mLs by Mouth Rinse route 2 (two) times daily. 09/17/14   Robbie Lis, MD  Cholecalciferol (VITAMIN D) 2000 UNITS tablet Take 2,000 Units by mouth daily.    Historical Provider, MD  digoxin (LANOXIN) 0.125 MG tablet Take 1 tablet (0.125 mg total) by mouth daily. 02/24/15   Nita Sells, MD  famotidine (PEPCID AC) 10 MG tablet Take 1 tablet (10 mg total) by mouth 2 (two) times daily. 09/17/14   Robbie Lis, MD  FLUoxetine (PROZAC) 10 MG capsule Take 3 capsules (30 mg total) by mouth every morning. 09/17/14   Robbie Lis, MD  ipratropium-albuterol (DUONEB) 0.5-2.5 (3) MG/3ML SOLN Take 3 mLs by nebulization 2 (two) times daily as needed (wheezing). 09/17/14   Robbie Lis, MD  LORazepam (ATIVAN) 0.5 MG tablet Take 1 tablet (0.5 mg total) by mouth every 8 (eight) hours as needed for anxiety (anxiety). Patient not taking: Reported on 03/02/2015 09/17/14   Robbie Lis, MD  menthol-cetylpyridinium (CEPACOL) 3 MG lozenge Take 1 lozenge (3 mg total) by mouth as needed for sore throat. Patient not taking: Reported on 02/18/2015 08/01/14   Nishant Dhungel, MD  metoprolol succinate (TOPROL-XL) 50 MG 24 hr tablet Take 50 mg by mouth every evening. Take with or immediately following a meal.    Historical Provider, MD  ondansetron (ZOFRAN) 4 MG tablet Take 1 tablet (4 mg total) by mouth every 6 (six) hours as needed for nausea. Patient not taking: Reported on 02/18/2015 09/17/14   Robbie Lis, MD  polyethylene glycol [facility] / Floria Raveling) packet Take 17 g by mouth daily. Patient not taking: Reported on 03/02/2015 02/24/15   Nita Sells, MD  predniSONE (DELTASONE) 20 MG tablet Take 3 tablets (60 mg total) by mouth daily before breakfast. 02/24/15   Nita Sells, MD  sorbitol 70 % SOLN Take 30 mLs by mouth daily as needed for moderate constipation. Patient not taking: Reported on 03/02/2015 02/24/15   Nita Sells, MD  tiotropium (SPIRIVA) 18 MCG inhalation capsule Place 18 mcg into inhaler and inhale daily.    Historical Provider, MD  valsartan (DIOVAN) 40 MG tablet Take 1 tablet by mouth daily. 01/31/15   Historical Provider, MD   BP 113/50 mmHg  Pulse 62  Temp(Src) 98.3 F (36.8 C) (Oral)  Resp 19  SpO2 99% Physical Exam  Constitutional: She is oriented to person, place, and time. She appears  well-developed and well-nourished. No distress.  Patient is an elderly female in no acute distress. She is awake, alert, and appropriate.  HENT:  Head: Normocephalic and atraumatic.  Neck: Normal range of motion. Neck supple.  Cardiovascular: Normal rate and regular rhythm.  Exam reveals no gallop and no friction rub.   No murmur heard. Pulmonary/Chest: Effort normal and breath sounds normal. No respiratory distress. She has no wheezes. She has no rales.  Abdominal: Soft. Bowel sounds are normal. She exhibits no distension. There is no tenderness.  Musculoskeletal: Normal range of motion. She exhibits no edema.  Neurological: She is alert and oriented to person, place, and time.  Skin: Skin is warm and dry. She is not diaphoretic.  Nursing note and vitals reviewed.   ED Course  Procedures (including critical care time) Labs Review Labs Reviewed  BASIC METABOLIC PANEL  CBC    Imaging Review No results found.   EKG Interpretation   Date/Time:  Thursday March 05 2015 16:23:36 EDT Ventricular Rate:  62 PR Interval:  128 QRS Duration: 77 QT Interval:  412 QTC Calculation: 418 R Axis:   41 Text Interpretation:  Sinus rhythm Borderline repolarization abnormality  No change from 03/05/2015 Confirmed by Anneka Studer  MD, Jian Hodgman ([id number]) on  03/05/2015 4:26:50 PM      MDM   Final diagnoses:  None    Workup reveals WBC of 21k with  chest xray suggestive of lower lobe pneumonia.  She will be treated for healthcare associated pneumonia with vancomycin and cefepime and admitted to the hospitalist service.    Veryl Speak, MD 03/05/15 [phone]

## 2015-03-05 NOTE — H&P (Signed)
[facility] History and Physical  Tonya Mckee BHA:[id number] DOB: Dec 01, 1928 DOA: 03/05/2015  Referring physician: ED physician PCP: [name]   Chief Complaint: chest pain, SOB  HPI:  Tonya Mckee is an 81yo woman with PMH of MDS, HTN, COPD on home O2 (2-4L), Afib with SSS, malnutrition, GERD, Dementia, chronic headaches, arthritis, nursing home resident who presents for chest pain and SOB for a few days.  Tonya Mckee reports that for the last few days she has noticed frontal chest pain that is worse with deep inspiration.  She further notes back pain.  The chest pain is difficult for her to describe but is anterior, substernal and on both sides of her sternum.  She also notes SOB and reports a need to use more oxygen (however, it is not clear how much oxygen she was using at the nursing home, she is on 4LNC here in the ED).  She denies fever, chills, hemoptysis or any other symptoms at this time.  She was recently in the hospital this month for a COPD exacerbation.  On CXR in the ED, she was found to have bilateral pleural effusions and a left sided pneumonia.  Her WBC is chronically elevated, but is doubled today to 24.  Differential not done in the ED and is pending.  She was given vancomycin and zosyn in the ED.    Of note, Tonya Mckee has MDS which has previously been treated with revlimid, however, this was held in the setting of a dental infection apparently.  She is currently on a taper of steroids and is supposed to be on 10mg  per day currently.  She has chronically low Hgb/anemia due to her MDS and is to receive 2 units of PRBCs if Hgb drops below 8 (today it is 7.6).  She also has had persistently low platelets, thought to be due to receiving Abx for a dental infection.  Today they are 56.  She has significant bruising and petechiae on chest.  She reports these are chronic due to her MDS.    NH documentation lists her as Full code and I confirmed this with daughter on  the phone.    Assessment and Plan:  HCAP (healthcare-associated pneumonia) - Pneumonia apparent on CXR, she was recently in the hospital and lives in a NH, will treat as HCAP - Vanc/Zosyn dosed in the ED and will be continued - Urinary antigens for legionella and strep - Sputum culture if able to provide - Blood culture X 2 - Trend CBC - Given age, pleural effusions, size (underweight) and multiple other fluids she will be getting tonight, along with her normal blood pressure, will hold off on further fluids and reassess in the AM.  Start fluids for low BP.  She also has an elevated BNP, but no reported history of CHF, will need to monitor breathing status closely - Given pleural effusion could be parapneumonic, consider thoracentesis in the AM - O2 as needed, on home dose (4L) now.  - Holding amlodipine, losartan.  Continued beta blocker given history of Afib and normotension currently.    Chest pain - Pleuritic in nature and very hard for her to localize - First TnI is negative - Telemetry - Trend TnI overnight - AM EKG  MDS (myelodysplastic syndrome) with 5q deletion, anemia with Hgb < 8 - Differential is pending of elevated WBC - She is on chronic steroids at home, presumably she received this today.  Consider stress dose steroids in the AM given acute  illness - Oncology consult in the AM to follow along with [place].  - As H/H below threshold, have ordered 2 Units of PRBC to be given.  Will need to monitor closely for respiratory issues  Thrombocytopenia - I have reviewed Oncology's last note, low platelets thought to be due to recent Abx for dental infection.  Concerning that she will be receiving more Abx - Consider platelet transfusion after blood transfusion if Plt's drop or remain below 50 - Consider Oncology consult for assistance - Trend CBC  Essential hypertension - Given HCAP, holding BP medications, restart as needed - Continue metoprolol for Afib    COPD GOLD II -  Minimal wheezing on exam, so pneumonia likely cause of SOB - Continue spiriva - Duonebs as needed    Persistent headaches - Chronic, monitor - Per patient's daughter, narcotics should be avoided b/c of over sedation (not surprising given cachexia)    Hyponatremia - Possibly related to acute lung disorder ongoing - Trend - Very mild and currently asymptomatic    Protein calorie malnutrition - Ensure TID    Atrial fibrillation with SSS - Continue metoprolol as noted - Telemetry - Continue digoxin   Radiological Exams on Admission: Dg Chest 2 View  03/05/2015   CLINICAL DATA:  81 year old female with intermittent chest pain  EXAM: CHEST  2 VIEW  COMPARISON:  Radiograph dated 02/22/2015  FINDINGS: Two views of the chest demonstrate emphysematous changes of the lungs. There is opacification of the left lower lobe likely combination of pleural effusion with associated atelectasis of the adjacent lung or pneumonia. A small right pleural effusion may be present. Stable cardiomegaly. No pneumothorax. Left chest wall surgical clips.  IMPRESSION: Bilateral pleural effusions, left greater right right with left lower lobe atelectasis/pneumonia. Clinical correlation and follow-up recommended.   Electronically Signed   By: Anner Crete M.D.   On: 03/05/2015 17:24   Code Status: Full Family Communication: Pt at bedside Disposition Plan: Admit for further evaluation    Gilles Chiquito, MD [phone]   Review of Systems:  Constitutional: + fatigue Negative for fever, chills, diaphoresis.  HENT: + for hard of hearing Negative for  ear pain, nosebleeds Eyes: Negative for blurred vision, double vision Respiratory: + for SOB Negative for cough, hemoptysis, sputum production,  wheezing Cardiovascular: + for pleuritic chest pain Negative palpitations, orthopnea, claudication and leg swelling.  Gastrointestinal: Negative for nausea, vomiting and abdominal pain.  Genitourinary: Negative for dysuria,  urgency, frequency Musculoskeletal: Negative for myalgias, back pain, joint pain Skin: Negative for itching and rash.  Neurological: Negative for dizziness and weakness.   Endo/Heme/Allergies: + easy bruising, low platelets.  Negative for environmental allergies and polydipsia. Does not bruise/bleed easily.      Past Medical History  Diagnosis Date  . COPD (chronic obstructive pulmonary disease)   . Hypertension   . PONV (postoperative nausea and vomiting)   . Dysrhythmia     rapid heart rate  . Stroke 2010    mini stroke  . Pneumonia 2009  . Shortness of breath   . GERD (gastroesophageal reflux disease)     long time ago  . Depression   . DEMENTIA     borderline  . Depression 01/02/2012  . Anemia 01/02/2012  . Artery stenosis 10/30/12    Moderate to severe left extracranial vertebral  . Cancer 20years ago+ 1999    lt breast  left  . SCC (squamous cell carcinoma), leg     Right  LE Dr. [place] Crane  .  Arthritis     Osteo-  Spine and Knees  . Vitamin B12 deficiency   . Vertigo, benign positional   . Memory loss   . Blepharospasm     Left eye  . Venous stasis ulcers 2012    Left LE due to trauma  . Cellulitis Jan 01, 2012    Left UE  . Carotid artery occlusion     Bruit- RIGHT  . Occlusion and stenosis of carotid artery without mention of cerebral infarction 11/06/2012  . Persistent headaches 02/26/2014  . Chronic respiratory failure     on NCO2     Past Surgical History  Procedure Laterality Date  . Abdominal hysterectomy    . Appendectomy    . Breast surgery    . Joint replacement      Lt arm/wrist plates and screws  . Eye surgery      L/R catarects    Social History:  reports that she quit smoking about 5 months ago. Her smoking use included Cigarettes. She has a 34 pack-year smoking history. She has never used smokeless tobacco. She reports that she does not drink alcohol or use illicit drugs.  Allergies  Allergen Reactions  . Donepezil Nausea And Vomiting  .  Penicillins Itching and Nausea And Vomiting  . Amoxapine And Related Nausea Only  . Doxycycline Rash  . Keflex [Cephalexin] Itching  . Bupropion Other (See Comments)    "Headaches on the XL."   . Cardizem [Diltiazem Hcl] Other (See Comments)    Bradycardia/presyncope  . Ketek [Telithromycin] Itching    Dry Mouth  . Levaquin [Levofloxacin In D5w] Other (See Comments)    Insomnia  . Sulfa Antibiotics Nausea And Vomiting  . Tramadol Other (See Comments)    "Made her feel like a zombie."  . Zyrtec [Cetirizine] Other (See Comments)    "made her like a zombie"    Family History  Problem Relation Age of Onset  . Adopted: Yes    Prior to Admission medications   Medication Sig Start Date End Date Taking? Authorizing Provider  acetaminophen (TYLENOL) 325 MG tablet Take 650 mg by mouth every 6 (six) hours as needed for moderate pain (pain).     Historical Provider, MD  acetaminophen-codeine (TYLENOL #3) 300-30 MG per tablet Take 1-2 tablets by mouth every 4 (four) hours as needed. pain 12/06/14   Historical Provider, MD  albuterol (PROVENTIL HFA;VENTOLIN HFA) 108 (90 BASE) MCG/ACT inhaler Inhale 2 puffs into the lungs every 6 (six) hours as needed for wheezing or shortness of breath (wheezing).     Historical Provider, MD  amLODipine (NORVASC) 5 MG tablet Take 1 tablet (5 mg total) by mouth daily. 02/24/15   Nita Sells, MD  calcium carbonate (TUMS - DOSED IN MG ELEMENTAL CALCIUM) 500 MG chewable tablet Chew 1 tablet by mouth daily as needed for indigestion or heartburn.    Historical Provider, MD  chlorhexidine (PERIDEX) 0.12 % solution 15 mLs by Mouth Rinse route 2 (two) times daily. 09/17/14   Robbie Lis, MD  Cholecalciferol (VITAMIN D) 2000 UNITS tablet Take 2,000 Units by mouth daily.    Historical Provider, MD  digoxin (LANOXIN) 0.125 MG tablet Take 1 tablet (0.125 mg total) by mouth daily. 02/24/15   Nita Sells, MD  famotidine (PEPCID AC) 10 MG tablet Take 1 tablet (10 mg  total) by mouth 2 (two) times daily. 09/17/14   Robbie Lis, MD  FLUoxetine (PROZAC) 10 MG capsule Take 3 capsules (30 mg total)  by mouth every morning. 09/17/14   Robbie Lis, MD  ipratropium-albuterol (DUONEB) 0.5-2.5 (3) MG/3ML SOLN Take 3 mLs by nebulization 2 (two) times daily as needed (wheezing). 09/17/14   Robbie Lis, MD         menthol-cetylpyridinium (CEPACOL) 3 MG lozenge Take 1 lozenge (3 mg total) by mouth as needed for sore throat. Patient not taking: Reported on 02/18/2015 08/01/14   Nishant Dhungel, MD  metoprolol succinate (TOPROL-XL) 50 MG 24 hr tablet Take 50 mg by mouth every evening. Take with or immediately following a meal.    Historical Provider, MD         polyethylene glycol (MIRALAX / GLYCOLAX) packet Take 17 g by mouth daily. Patient not taking: Reported on 03/02/2015 02/24/15   Nita Sells, MD  predniSONE (DELTASONE) 20 MG tablet 10mg  daily      sorbitol 70 % SOLN Take 30 mLs by mouth daily as needed for moderate constipation. Patient not taking: Reported on 03/02/2015 02/24/15   Nita Sells, MD  tiotropium (SPIRIVA) 18 MCG inhalation capsule Place 18 mcg into inhaler and inhale daily.    Historical Provider, MD           Physical Exam: Filed Vitals:   03/05/15 1626 03/05/15 1632 03/05/15 1802  BP: 103/43 113/50 111/57  Pulse: 62 62 66  Temp: 98.3 F (36.8 C)    TempSrc: Oral    Resp: 26 19 16   SpO2: 98% 99% 99%    Physical Exam  Constitutional: Very thin woman, appears chronically ill, glasses in place.  [place] in place  HENT: Normocephalic. Atraumatic Eyes: Conjunctivaeare normal.  no scleral icterus.  CVS: RR, NR, S1/S2 +, no murmurs noted Pulmonary: Significantly decreased breath sounds bilaterally, crackles at right base, occasional expiratory wheeze Abdominal: Soft. BS +,  no distension, tenderness, rebound or guarding.  Musculoskeletal: No edema and no tenderness.  Neuro: Alert. Thin, normal muscle tone, No cranial nerve deficit. Skin:  Skin is warm and dry. She has significant ecchymoses on shins, arms, hands.  Petechiae on chest Psychiatric: Behavior, judgment, thought content normal.   Labs on Admission:  Basic Metabolic Panel:  Recent Labs Lab 03/05/15 1630  NA 133*  K 3.8  CL 96*  CO2 28  GLUCOSE 123*  BUN 22*  CREATININE 0.85  CALCIUM 8.0*     Recent Labs Lab 03/02/15 1045 03/05/15 1630  WBC 11.5* 21.4*  HGB 8.9* 7.6*  HCT 28.0* 23.1*  MCV 92.8 91.7  PLT 50* 33*   Cardiac Enzymes:  Recent Labs Lab 03/05/15 1630  TROPONINI <0.03    EKG: Normal sinus rhythm  If 7PM-7AM, please contact night-coverage [email or web address] Password [email or web address] 03/05/2015, 6:18 PM

## 2015-03-05 NOTE — ED Notes (Signed)
Dr. Delo at bedside. 

## 2015-03-05 NOTE — ED Notes (Signed)
Per Dr. Stark Jock, no blood cultures prior to abx.

## 2015-03-05 NOTE — ED Notes (Signed)
Per EMS- pt from [place] home, complains of intermittent central chest pain x3 days, normally wears 3 L O2 nasal cannula. Was given 324 aspirin in route as well as 1 nitro. Pt reported an episode of nausea. 1 nitro relieved chest pain 9/10 to no pain. VSS. A/O x4.

## 2015-03-05 NOTE — ED Notes (Signed)
Pt. Is back in room. 

## 2015-03-05 NOTE — Progress Notes (Signed)
ANTIBIOTIC CONSULT NOTE - INITIAL  Pharmacy Consult for Vancomycin/Aztreonam Indication: rule out pneumonia  Allergies  Allergen Reactions  . Donepezil Nausea And Vomiting  . Penicillins Itching and Nausea And Vomiting  . Amoxapine And Related Nausea Only  . Doxycycline Rash  . Keflex [Cephalexin] Itching  . Bupropion Other (See Comments)    "Headaches on the XL."   . Cardizem [Diltiazem Hcl] Other (See Comments)    Bradycardia/presyncope  . Ketek [Telithromycin] Itching    Dry Mouth  . Levaquin [Levofloxacin In D5w] Other (See Comments)    Insomnia  . Sulfa Antibiotics Nausea And Vomiting  . Tramadol Other (See Comments)    "Made her feel like a zombie."  . Zyrtec [Cetirizine] Other (See Comments)    "made her like a zombie"    Patient Measurements: Height: 5\' 1"  (154.9 cm) Weight: 112 lb 3.4 oz (50.9 kg) IBW/kg (Calculated) : 47.8  Vital Signs: Temp: 97.5 F (36.4 C) (07/21 2004) Temp Source: Oral (07/21 2004) BP: 142/57 mmHg (07/21 2004) Pulse Rate: 65 (07/21 2004) Intake/Output from previous day:   Intake/Output from this shift: Total I/O In: 240 [P.O.:240] Out: 0   Labs:  Recent Labs  03/05/15 1630  WBC 21.4*  HGB 7.6*  PLT 33*  CREATININE 0.85   Estimated Creatinine Clearance: 37.2 mL/min (by C-G formula based on Cr of 0.85). No results for input(s): VANCOTROUGH, VANCOPEAK, VANCORANDOM, GENTTROUGH, GENTPEAK, GENTRANDOM, TOBRATROUGH, TOBRAPEAK, TOBRARND, AMIKACINPEAK, AMIKACINTROU, AMIKACIN in the last 72 hours.   Microbiology: Recent Results (from the past 720 hour(s))  Blood culture (routine x 2)     Status: None   Collection Time: 02/18/15  2:41 AM  Result Value Ref Range Status   Specimen Description BLOOD BLOOD RIGHT FOREARM  Final   Special Requests BOTTLES DRAWN AEROBIC AND ANAEROBIC 5CC  Final   Culture   Final    NO GROWTH 5 DAYS Performed at  [facility]    Report Status 02/23/2015 FINAL  Final  Urine culture     Status: None    Collection Time: 02/18/15  2:43 AM  Result Value Ref Range Status   Specimen Description URINE, CLEAN CATCH  Final   Special Requests NONE  Final   Culture   Final    NO GROWTH 1 DAY Performed at [facility]    Report Status 02/19/2015 FINAL  Final  Blood culture (routine x 2)     Status: None   Collection Time: 02/18/15  2:46 AM  Result Value Ref Range Status   Specimen Description BLOOD RIGHT ANTECUBITAL  Final   Special Requests BOTTLES DRAWN AEROBIC AND ANAEROBIC 5CC  Final   Culture   Final    NO GROWTH 5 DAYS Performed at [facility]    Report Status 02/23/2015 FINAL  Final  MRSA PCR Screening     Status: None   Collection Time: 02/19/15  8:16 PM  Result Value Ref Range Status   MRSA by PCR NEGATIVE NEGATIVE Final    Comment:        The GeneXpert MRSA Assay (FDA approved for NASAL specimens only), is one component of a comprehensive MRSA colonization surveillance program. It is not intended to diagnose MRSA infection nor to guide or monitor treatment for MRSA infections.   MRSA PCR Screening     Status: None   Collection Time: 03/05/15  8:42 PM  Result Value Ref Range Status   MRSA by PCR NEGATIVE NEGATIVE Final    Comment:  The GeneXpert MRSA Assay (FDA approved for NASAL specimens only), is one component of a comprehensive MRSA colonization surveillance program. It is not intended to diagnose MRSA infection nor to guide or monitor treatment for MRSA infections.     Medical History: Past Medical History  Diagnosis Date  . COPD (chronic obstructive pulmonary disease)   . Hypertension   . PONV (postoperative nausea and vomiting)   . Dysrhythmia     rapid heart rate  . Stroke 2010    mini stroke  . Pneumonia 2009  . Shortness of breath   . GERD (gastroesophageal reflux disease)     long time ago  . Depression   . DEMENTIA     borderline  . Depression 01/02/2012  . Anemia 01/02/2012  . Artery stenosis 10/30/12    Moderate  to severe left extracranial vertebral  . Cancer 20years ago+ 1999    lt breast  left  . SCC (squamous cell carcinoma), leg     Right  LE Dr. [place] Crane  . Arthritis     Osteo-  Spine and Knees  . Vitamin B12 deficiency   . Vertigo, benign positional   . Memory loss   . Blepharospasm     Left eye  . Venous stasis ulcers 2012    Left LE due to trauma  . Cellulitis Jan 01, 2012    Left UE  . Carotid artery occlusion     Bruit- RIGHT  . Occlusion and stenosis of carotid artery without mention of cerebral infarction 11/06/2012  . Persistent headaches 02/26/2014  . Chronic respiratory failure     on NCO2    Assessment: 81 y/o F from nursing home with chest pain/shortness of breath, WBC elevated, renal function age appropriate, CXR with LLL PNA/atelectasis. Noted patient with multiple drug allergies.   Goal of Therapy:  Vancomycin trough level 15-20 mcg/ml  Plan:  -Vancomycin 750 mg IV q24h -Aztreonam 1g IV q8h -Trend WBC, temp, renal function  -Drug levels as indicated  Narda Bonds 03/05/2015,11:17 PM

## 2015-03-06 ENCOUNTER — Inpatient Hospital Stay (HOSPITAL_COMMUNITY): Payer: Medicare Other

## 2015-03-06 ENCOUNTER — Encounter (HOSPITAL_COMMUNITY): Payer: Self-pay | Admitting: Radiology

## 2015-03-06 LAB — DIFFERENTIAL
BASOS PCT: 0 % (ref 0–1)
Basophils Absolute: 0 10*3/uL (ref 0.0–0.1)
EOS PCT: 0 % (ref 0–5)
Eosinophils Absolute: 0 10*3/uL (ref 0.0–0.7)
LYMPHS ABS: 3.1 10*3/uL (ref 0.7–4.0)
LYMPHS PCT: 17 % (ref 12–46)
Monocytes Absolute: 9.1 10*3/uL — ABNORMAL HIGH (ref 0.1–1.0)
Monocytes Relative: 50 % — ABNORMAL HIGH (ref 3–12)
Neutro Abs: 6.1 10*3/uL (ref 1.7–7.7)
Neutrophils Relative %: 33 % — ABNORMAL LOW (ref 43–77)

## 2015-03-06 LAB — ABO/RH: ABO/RH(D): O POS

## 2015-03-06 LAB — CBC WITH DIFFERENTIAL/PLATELET
BASOS ABS: 0 10*3/uL (ref 0.0–0.1)
Band Neutrophils: 0 % (ref 0–10)
Basophils Relative: 0 % (ref 0–1)
Blasts: 0 %
EOS PCT: 0 % (ref 0–5)
Eosinophils Absolute: 0 10*3/uL (ref 0.0–0.7)
HCT: 31.6 % — ABNORMAL LOW (ref 36.0–46.0)
Hemoglobin: 10.8 g/dL — ABNORMAL LOW (ref 12.0–15.0)
LYMPHS PCT: 9 % — AB (ref 12–46)
Lymphs Abs: 1.7 10*3/uL (ref 0.7–4.0)
MCH: 30.3 pg (ref 26.0–34.0)
MCHC: 34.2 g/dL (ref 30.0–36.0)
MCV: 88.5 fL (ref 78.0–100.0)
METAMYELOCYTES PCT: 0 %
MONO ABS: 4.8 10*3/uL — AB (ref 0.1–1.0)
Monocytes Relative: 26 % — ABNORMAL HIGH (ref 3–12)
Myelocytes: 0 %
NRBC: 0 /100{WBCs}
Neutro Abs: 12 10*3/uL — ABNORMAL HIGH (ref 1.7–7.7)
Neutrophils Relative %: 65 % (ref 43–77)
Platelets: 26 10*3/uL — CL (ref 150–400)
Promyelocytes Absolute: 0 %
RBC: 3.57 MIL/uL — AB (ref 3.87–5.11)
RDW: 17.3 % — ABNORMAL HIGH (ref 11.5–15.5)
WBC: 18.5 10*3/uL — AB (ref 4.0–10.5)

## 2015-03-06 LAB — TROPONIN I: Troponin I: 0.13 ng/mL — ABNORMAL HIGH (ref <0.031)

## 2015-03-06 LAB — COMPREHENSIVE METABOLIC PANEL
ALT: 17 U/L (ref 14–54)
ANION GAP: 6 (ref 5–15)
AST: 24 U/L (ref 15–41)
Albumin: 2.1 g/dL — ABNORMAL LOW (ref 3.5–5.0)
Alkaline Phosphatase: 61 U/L (ref 38–126)
BUN: 16 mg/dL (ref 6–20)
CO2: 29 mmol/L (ref 22–32)
Calcium: 7.8 mg/dL — ABNORMAL LOW (ref 8.9–10.3)
Chloride: 98 mmol/L — ABNORMAL LOW (ref 101–111)
Creatinine, Ser: 0.64 mg/dL (ref 0.44–1.00)
GFR calc Af Amer: >60 mL/min (ref >60)
GFR calc non Af Amer: >60 mL/min (ref >60)
Glucose, Bld: 128 mg/dL — ABNORMAL HIGH (ref 65–99)
Potassium: 3.6 mmol/L (ref 3.5–5.1)
SODIUM: 133 mmol/L — AB (ref 135–145)
Total Bilirubin: 2.4 mg/dL — ABNORMAL HIGH (ref 0.3–1.2)
Total Protein: 6 g/dL — ABNORMAL LOW (ref 6.5–8.1)

## 2015-03-06 LAB — PATHOLOGIST SMEAR REVIEW

## 2015-03-06 LAB — HIV ANTIBODY (ROUTINE TESTING W REFLEX): HIV Screen 4th Generation wRfx: NONREACTIVE

## 2015-03-06 LAB — STREP PNEUMONIAE URINARY ANTIGEN: Strep Pneumo Urinary Antigen: NEGATIVE

## 2015-03-06 MED ORDER — MORPHINE SULFATE 2 MG/ML IJ SOLN
1.0000 mg | INTRAMUSCULAR | Status: AC
Start: 2015-03-06 — End: 2015-03-06
  Administered 2015-03-06: 1 mg via INTRAVENOUS

## 2015-03-06 MED ORDER — MORPHINE SULFATE 2 MG/ML IJ SOLN
1.0000 mg | INTRAMUSCULAR | Status: DC | PRN
  Administered 2015-03-06 – 2015-03-11 (×16): 1 mg via INTRAVENOUS
  Filled 2015-03-06 (×18): qty 1

## 2015-03-06 MED ORDER — IOHEXOL 350 MG/ML SOLN
100.0000 mL | Freq: Once | INTRAVENOUS | Status: AC | PRN
  Administered 2015-03-06: 100 mL via INTRAVENOUS

## 2015-03-06 MED ORDER — IPRATROPIUM-ALBUTEROL 0.5-2.5 (3) MG/3ML IN SOLN
3.0000 mL | Freq: Four times a day (QID) | RESPIRATORY_TRACT | Status: DC
  Administered 2015-03-06 – 2015-03-07 (×4): 3 mL via RESPIRATORY_TRACT
  Filled 2015-03-06 (×4): qty 3

## 2015-03-06 MED ORDER — IPRATROPIUM-ALBUTEROL 0.5-2.5 (3) MG/3ML IN SOLN: 3.0000 mL | RESPIRATORY_TRACT | Status: DC | PRN

## 2015-03-06 MED ORDER — HYDROCODONE-ACETAMINOPHEN 5-325 MG PO TABS
0.5000 | ORAL_TABLET | Freq: Four times a day (QID) | ORAL | Status: DC | PRN
Start: 2015-03-06 — End: 2015-03-06

## 2015-03-06 NOTE — Progress Notes (Signed)
Utilization review completed. Kemaria Dedic, RN, BSN. 

## 2015-03-06 NOTE — Progress Notes (Signed)
   03/06/15 1400  Clinical Encounter Type  Visited With Patient  Visit Type Initial;Spiritual support;Social support  Spiritual Encounters  Spiritual Needs Prayer;Emotional  Stress Factors  Patient Stress Factors Exhausted;Other (Comment) (Pain)   Chaplain was referred to patient via spiritual care consult. Chaplain visited with patient for roughly 15 minutes today. Patient was in poor spirits and in a lot of pain while chaplain was present. Patient stated that she did not know this amount of pain was possible. Patient feels she is way to old to endure this type of pain and suffering and said that she would not be upset if her relief came through passing away. Patient  asked for prayer. Chaplain prayed with patient and for the relief of the patient's pain. Chaplain made patient aware that he is available for further support. Chaplain will continue to provide emotional and spiritual support for patient as needed.  Alisson Rozell, Claudius Sis, Chaplain  3:01 PM

## 2015-03-06 NOTE — Progress Notes (Signed)
CRITICAL VALUE ALERT  Critical value received: platelets 26  Date of notification: 03/06/2015   Time of notification:  9702  Critical value read back:Yes.    Nurse who received alert: Penni Bombard, RN   MD notified (1st page): Doyle Askew MD  Time of first page:  3:09 PM   MD notified (2nd page):  Time of second page:  Responding MD:  Doyle Askew   Time MD responded:

## 2015-03-06 NOTE — Progress Notes (Addendum)
Patient ID: Tonya Mckee, female   DOB: 1929/04/24, 81 y.o.   MRN: [id number]  [facility] PROGRESS NOTE  [name] NWG:[id number] DOB: 1928-10-16 DOA: 03/05/2015 PCP: [name]   Brief narrative:    81 yo female with HTN, MDS, COPD on 2-4 L O2 via [place] at home, a-fib with SSS, dementia, SNF resident who presented to [facility] for evaluation of several days duration of progressive dyspnea that was noted by nursing staff, with exertion and even at rest. This has been associated with productive cough of clear and yellow sputum, worse with deep inspiration and with no specific alleviating factors. Pt also reports mid area chest discomfort with coughing spells, that occasionally but not consistently radiating to the back area, denies fevers, chills, no recent similar events.   In ED, VS notable for T 99.3 with HR stable and at target range, RR up to 26 bpm, BP stable. CXR notable for bilateral pleural effusion and left sided PNA. WBC 21K. Pt started on vancomycin and zosyn in ED and [facility] asked to admit for further evaluation.   Of note, [name] has MDS which has previously been treated with revlimid, however, this was held in the setting of a dental infection apparently.She is currently on a taper of steroids and is supposed to be on 81m per day currently. She has chronically low Hgb/anemia due to her MDS and is to receive 2 units of PRBCs if Hgb drops below 8 (was 7.6 n admission ).She also has had persistently low platelets, thought to be due to receiving Abx for a recent dental infection.  FULL CODE status confirmed with daughter over the phone.    Assessment/Plan:    Sepsis secondary to HCAP (healthcare-associated pneumonia) - pt met criteria for sepsis with WBC 21K, RR up to 26 bpm, source PNA  - Pneumonia apparent on CXR, she was recently in the hospital and lives in a NH, ABX broadened to cover for HCAP  - continue vanc and aztreonam day #2 - Urinary antigens for  legionella and strep pending  - Sputum culture also requested  - Blood culture X 2 obtained, follow up - Trend CBC and continue to keep on oxygen via [place] to keep O2 sat > 92% - agree with holding IVF for now unless BP drops   Acute on chronic respiratory failure, oxygen dependent at home 2-4 L via [place] secondary to COPD - now with HCAP - ABX as noted above and keep on oxygen via  - BD as needed   Chest pain - Pleuritic in nature and very hard for her to localize - First Troponin negative but trending up - will continue to trend - CT chest angio requested to rule out PE - can not start AC due to bleeding risk and low Plt  - will consult with cardiology   MDS (myelodysplastic syndrome) with 5q deletion, anemia with Hgb < 8 - She is on chronic steroids at home, presumably she received this the day of the admission. - pt has been transfused two U PRBC on admission with Hg up from 7.6 --> 10.8 this aM - repeat CBC in AM  Thrombocytopenia - per oncology's last note, low platelets thought to be due to recent Abx for dental infection vs MDS itself  - Plt drop from 33 --> 26 K this AM - repeat CBC in AM and transfuse if Plt < 10 K or bleeding  - Consider Oncology consult for assistance - Trend CBC  Essential hypertension - Given HCAP, holding BP medications, restart as needed - Continue metoprolol for Afib   COPD GOLD II - Minimal wheezing on exam, so pneumonia likely cause of SOB - Continue spiriva - Duonebs as needed   Persistent headaches - Chronic, monitor - Per patient's daughter, narcotics should be avoided b/c of over sedation (not surprising given cachexia)   Hyponatremia - Possibly related to acute lung disorder ongoing - Trend - Very mild and currently asymptomatic   Protein calorie malnutrition, severe - Ensure TID - nutritionist consulted    Atrial fibrillation with SSS, CHADS 2 = 3-4  - Continue metoprolol as noted - Telemetry - Continue  digoxin  DVT prophylaxis - SCD's  Code Status: Full.  Family Communication:  plan of [facility] discussed with the patient Disposition Plan: not ready for d/c   IV access:  Peripheral IV  Procedures and diagnostic studies:    Dg Chest 2 View 03/05/2015  Bilateral pleural effusions, left greater right right with left lower lobe atelectasis/pneumonia.   Medical Consultants:  None  Other Consultants:  Nutritionist   IAnti-Infectives:   Vancomycin 7/21 --> Aztreonam 7/21 -->  Faye Ramsay, MD  [facility] Pager [phone]  If 7PM-7AM, please contact night-coverage [email or web address] Password [email or web address] 03/06/2015, 5:01 PM   LOS: 1 day   HPI/Subjective: No events overnight.   Objective: Filed Vitals:   03/06/15 0640 03/06/15 0713 03/06/15 0945 03/06/15 1636  BP: 144/57 142/60 149/60 140/61  Pulse: 77 76 88 78  Temp: 98.9 F (37.2 C) 99.1 F (37.3 C) 98.9 F (37.2 C) 98.8 F (37.1 C)  TempSrc: Axillary Oral Oral Oral  Resp: '21 22 22 19  ' Height:      Weight:      SpO2: 96% 96% 96% 98%    Intake/Output Summary (Last 24 hours) at 03/06/15 1701 Last data filed at 03/06/15 1430  Gross per 24 hour  Intake   1500 ml  Output    200 ml  Net   1300 ml    Exam:   General:  Pt is alert, follows commands appropriately, in no distress   Cardiovascular: Regular rate and rhythm, no rubs, no gallops  Respiratory: scattered rhonchi   Abdomen: Soft, non tender, non distended, bowel sounds present, no guarding  Extremities:pulses DP and PT palpable bilaterally  Data Reviewed: Basic Metabolic Panel:  Recent Labs Lab 03/05/15 1630 03/06/15 1336  NA 133* 133*  K 3.8 3.6  CL 96* 98*  CO2 28 29  GLUCOSE 123* 128*  BUN 22* 16  CREATININE 0.85 0.64  CALCIUM 8.0* 7.8*   Liver Function Tests:  Recent Labs Lab 03/06/15 1336  AST 24  ALT 17  ALKPHOS 61  BILITOT 2.4*  PROT 6.0*  ALBUMIN 2.1*   CBC:  Recent Labs Lab 03/02/15 1045 03/05/15 1630 03/05/15 2240  03/06/15 1336  WBC 11.5* 21.4*  --  18.5*  NEUTROABS  --   --  6.1 12.0*  HGB 8.9* 7.6*  --  10.8*  HCT 28.0* 23.1*  --  31.6*  MCV 92.8 91.7  --  88.5  PLT 50* 33*  --  26*   Cardiac Enzymes:  Recent Labs Lab 03/05/15 1630 03/05/15 2240 03/06/15 1336  TROPONINI <0.03 0.05* 0.13*     Recent Results (from the past 240 hour(s))  MRSA PCR Screening     Status: None   Collection Time: 03/05/15  8:42 PM  Result Value Ref Range Status   MRSA by PCR NEGATIVE NEGATIVE Final  Culture, blood (routine x 2) Call MD if unable to obtain prior to antibiotics being given     Status: None (Preliminary result)   Collection Time: 03/05/15 10:30 PM  Result Value Ref Range Status   Specimen Description BLOOD RIGHT ARM  Final   Special Requests   Final    BOTTLES DRAWN AEROBIC AND ANAEROBIC 10CC BLUE 8CC RED   Culture NO GROWTH < 24 HOURS  Final   Report Status PENDING  Incomplete  Culture, blood (routine x 2) Call MD if unable to obtain prior to antibiotics being given     Status: None (Preliminary result)   Collection Time: 03/05/15 10:40 PM  Result Value Ref Range Status   Specimen Description BLOOD RIGHT ARM  Final   Special Requests   Final    BOTTLES DRAWN AEROBIC AND ANAEROBIC 10CC BLUE [place] RED   Culture NO GROWTH < 24 HOURS  Final   Report Status PENDING  Incomplete     Scheduled Meds: . sodium chloride   Intravenous Once  . aztreonam  1 g Intravenous 3 times per day  . digoxin  0.125 mg Oral Daily  . famotidine  10 mg Oral BID  . feeding supplement (ENSURE ENLIVE)  237 mL Oral Daily  . FLUoxetine  30 mg Oral q morning - 10a  . ipratropium-albuterol  3 mL Nebulization QID  . metoprolol succinate  50 mg Oral QPM  . polyethylene glycol  17 g Oral Daily  . predniSONE  10 mg Oral Q breakfast  . tiotropium  18 mcg Inhalation Daily  . vancomycin  750 mg Intravenous Q24H   Continuous Infusions:

## 2015-03-07 ENCOUNTER — Inpatient Hospital Stay (HOSPITAL_COMMUNITY): Payer: Medicare Other

## 2015-03-07 LAB — BASIC METABOLIC PANEL
ANION GAP: 10 (ref 5–15)
BUN: 15 mg/dL (ref 6–20)
CALCIUM: 8.1 mg/dL — AB (ref 8.9–10.3)
CO2: 27 mmol/L (ref 22–32)
Chloride: 96 mmol/L — ABNORMAL LOW (ref 101–111)
Creatinine, Ser: 0.52 mg/dL (ref 0.44–1.00)
GFR calc non Af Amer: >60 mL/min (ref >60)
Glucose, Bld: 104 mg/dL — ABNORMAL HIGH (ref 65–99)
Potassium: 3.8 mmol/L (ref 3.5–5.1)
Sodium: 133 mmol/L — ABNORMAL LOW (ref 135–145)

## 2015-03-07 LAB — TYPE AND SCREEN
ABO/RH(D): O POS
Antibody Screen: NEGATIVE
UNIT DIVISION: 0
Unit division: 0

## 2015-03-07 LAB — CBC
HEMATOCRIT: 30.8 % — AB (ref 36.0–46.0)
Hemoglobin: 10.3 g/dL — ABNORMAL LOW (ref 12.0–15.0)
MCH: 29.7 pg (ref 26.0–34.0)
MCHC: 33.4 g/dL (ref 30.0–36.0)
MCV: 88.8 fL (ref 78.0–100.0)
PLATELETS: 28 10*3/uL — AB (ref 150–400)
RBC: 3.47 MIL/uL — AB (ref 3.87–5.11)
RDW: 17.8 % — AB (ref 11.5–15.5)
WBC: 22.9 10*3/uL — ABNORMAL HIGH (ref 4.0–10.5)

## 2015-03-07 LAB — BODY FLUID CELL COUNT WITH DIFFERENTIAL
Eos, Fluid: 0 %
Lymphs, Fluid: 17 %
MONOCYTE-MACROPHAGE-SEROUS FLUID: 25 % — AB (ref 50–90)
Neutrophil Count, Fluid: 58 % — ABNORMAL HIGH (ref 0–25)
Total Nucleated Cell Count, Fluid: 663 cu mm (ref 0–1000)

## 2015-03-07 LAB — PROTEIN, BODY FLUID: TOTAL PROTEIN, FLUID: 3.6 g/dL

## 2015-03-07 LAB — TROPONIN I: Troponin I: 0.03 ng/mL (ref <0.031)

## 2015-03-07 LAB — LACTATE DEHYDROGENASE, PLEURAL OR PERITONEAL FLUID: LD, Fluid: 668 U/L — ABNORMAL HIGH (ref 3–23)

## 2015-03-07 LAB — GLUCOSE, SEROUS FLUID: GLUCOSE FL: 50 mg/dL

## 2015-03-07 MED ORDER — METOPROLOL TARTRATE 1 MG/ML IV SOLN
5.0000 mg | INTRAVENOUS | Status: DC | PRN
  Administered 2015-03-07: 5 mg via INTRAVENOUS
  Filled 2015-03-07: qty 5

## 2015-03-07 MED ORDER — LEVALBUTEROL HCL 1.25 MG/0.5ML IN NEBU: 1.2500 mg | INHALATION_SOLUTION | Freq: Four times a day (QID) | RESPIRATORY_TRACT | Status: DC | PRN

## 2015-03-07 MED ORDER — IPRATROPIUM-ALBUTEROL 0.5-2.5 (3) MG/3ML IN SOLN: 3.0000 mL | RESPIRATORY_TRACT | Status: DC | PRN

## 2015-03-07 MED ORDER — FUROSEMIDE 10 MG/ML IJ SOLN
20.0000 mg | Freq: Two times a day (BID) | INTRAMUSCULAR | Status: DC
  Administered 2015-03-07 – 2015-03-13 (×13): 20 mg via INTRAVENOUS
  Filled 2015-03-07 (×10): qty 2

## 2015-03-07 MED ORDER — LEVALBUTEROL HCL 1.25 MG/0.5ML IN NEBU
1.2500 mg | INHALATION_SOLUTION | Freq: Three times a day (TID) | RESPIRATORY_TRACT | Status: DC
  Administered 2015-03-07 – 2015-03-09 (×5): 1.25 mg via RESPIRATORY_TRACT
  Filled 2015-03-07 (×13): qty 0.5

## 2015-03-07 MED ORDER — IPRATROPIUM-ALBUTEROL 0.5-2.5 (3) MG/3ML IN SOLN
3.0000 mL | Freq: Three times a day (TID) | RESPIRATORY_TRACT | Status: DC
  Filled 2015-03-07: qty 3

## 2015-03-07 MED ORDER — LIDOCAINE-EPINEPHRINE 1 %-1:100000 IJ SOLN
INTRAMUSCULAR | Status: AC
  Filled 2015-03-07: qty 1

## 2015-03-07 NOTE — Clinical Social Work Note (Signed)
Clinical Social Work Assessment  Patient Details  Name: Tonya [facility]kee MRN: [id number] Date of Birth: 1928-09-23  Date of referral:  03/07/15               Reason for consult:  Discharge Planning                Permission sought to share information with:  Family Supports Permission granted to share information::  Yes, Verbal Permission Granted  Name::     Regulatory affairs officer::     Relationship::  dtr  Contact Information:     Housing/Transportation Living arrangements for the past 2 months:  [place] of Information:  Patient, Adult Children Patient Interpreter Needed:  None Criminal Activity/Legal Involvement Pertinent to Current Situation/Hospitalization:  No - Comment as needed Significant Relationships:  Adult Children Lives with:  Facility Resident Do you feel safe going back to the place where you live?  Yes Need for family participation in patient care:  Yes (Comment)  Care giving concerns:  Patient is from SNF and will return to rehab at [facility].   Social Worker assessment / plan:  CSW received referral due to patient being admitted from facility. CSW met with patient and dtr [facility]) at bedside. CSW introduced myself and explained role.  Patient was previously placed at [facility] but family is unsure if patient will return at [facility]. Dtr reports that family is considering placement in [place] to be close to her. CSW explained that ambulance would not travel that far unless payment was received prior to transportation. Dtr reports that she and family need to discuss plans but will possibly want alternative placement in [facility] as well.  CSW explained process and provided SNF list. Dtr asked questions re: Medicaid and CSW referred dtr to Department of Social Services.  CSW completed FL2 and will follow up with family to determine their desires for placement.   Employment status:  Retired Nurse, adult PT Recommendations:   Not assessed at this time Information / Referral to community resources:  [place]  Patient/Family's Response to care:  Patient unable to fully participate but dtr engaged and asked appropriate questions.  Patient/Family's Understanding of and Emotional Response to Diagnosis, Current Treatment, and Prognosis:  Dtr reports she is sad that patient has declined and wished that family had set a plan in place prior to decline. Dtr reports it is difficult to see patient in poor condition and cries often. CSW validated feelings and offered emotional support.  Emotional Assessment Appearance:  Appears stated age Attitude/Demeanor/Rapport:  Lethargic Affect (typically observed):  Flat Orientation:  Oriented to Self, Oriented to Place Alcohol / Substance use:  Not Applicable Psych involvement (Current and /or in the community):  No (Comment)  Discharge Needs  Concerns to be addressed:  No discharge needs identified Readmission within the last 30 days:  Yes Current discharge risk:  None Barriers to Discharge:  No Barriers Identified   Boone Master, [place] 03/07/2015, 3:25 PM Weekend Coverage

## 2015-03-07 NOTE — Progress Notes (Addendum)
Initial Nutrition Assessment  INTERVENTION:    Magic cup TID with meals, each supplement provides 290 kcal and 9 grams of protein  Recommend liberalizing diet to regular to help improve PO intake  NUTRITION DIAGNOSIS:   Inadequate oral intake related to dysphagia as evidenced by per patient/family report.  GOAL:   Patient will meet greater than or equal to 90% of their needs  MONITOR:   PO intake, Supplement acceptance, Weight trends, Labs  REASON FOR ASSESSMENT:   Malnutrition Screening Tool    ASSESSMENT:   Patient admitted on 7/21 from SNF with progressive dyspnea, even at rest.  Spoke with patient and her daughter. They report that patient has been progressively losing weight over the past 1-2 years. It started when her top teeth were removed. She does well with potatoes, omelets, ice cream, and loves anything with added cheese. She likes magic cups, has had them in the past. She has had 9% weight loss over the past year, which is not significant, however, she does have some mild-moderate depletion of muscle mass. Weight has stabilized over the past 5 months. Discussed ways to increase protein and calorie intake. She does not like Ensure because it gives her diarrhea.  Diet Order:  Diet 2 gram sodium Room service appropriate?: Yes; Fluid consistency:: Thin  Skin:  Reviewed, no issues  Last BM:  7/20  Height:   Ht Readings from Last 1 Encounters:  03/05/15 5\' 1"  (1.549 m)    Weight:   Wt Readings from Last 1 Encounters:  03/07/15 111 lb 1.8 oz (50.4 kg)    Ideal Body Weight:  47.7 kg   Wt Readings from Last 30 Encounters:  03/07/15 111 lb 1.8 oz (50.4 kg)  03/02/15 114 lb 6.4 oz (51.891 kg)  02/24/15 124 lb (56.246 kg)  02/12/15 110 lb 3.2 oz (49.986 kg)  01/30/15 111 lb 6.4 oz (50.531 kg)  12/12/14 112 lb (50.803 kg)  11/19/14 111 lb 6.4 oz (50.531 kg)  10/16/14 113 lb 14.4 oz (51.665 kg)  10/14/14 112 lb 8 oz (51.03 kg)  10/06/14 113 lb (51.256 kg)   09/02/14 118 lb 9.6 oz (53.797 kg)  08/14/14 118 lb 8 oz (53.751 kg)  08/01/14 119 lb 7.8 oz (54.2 kg)  07/14/14 122 lb 4.8 oz (55.475 kg)  06/30/14 123 lb 6.4 oz (55.974 kg)  05/26/14 120 lb 9.6 oz (54.704 kg)  05/22/14 121 lb (54.885 kg)  04/24/14 122 lb 4.8 oz (55.475 kg)  02/26/14 122 lb (55.339 kg)  01/30/14 122 lb 6.4 oz (55.52 kg)  01/01/14 124 lb 9.6 oz (56.518 kg)  12/23/13 125 lb 9.6 oz (56.972 kg)  11/28/13 126 lb 12.8 oz (57.516 kg)  11/06/12 125 lb (56.7 kg)  01/02/12 128 lb (58.06 kg)     BMI:  Body mass index is 21.01 kg/(m^2).  Estimated Nutritional Needs:   Kcal:  1200-1400  Protein:  60-70 gm  Fluid:  1.4-1.5 L  EDUCATION NEEDS:   Education needs addressed   Molli Barrows, [place], [place], [place] Pager [phone] After Hours Pager [phone]

## 2015-03-07 NOTE — Progress Notes (Signed)
Patient ID: Tonya [facility]kee, female   DOB: July 14, 1929, 81 y.o.   MRN: [id number]  [facility] PROGRESS NOTE  [facility]a [facility] EHO:[id number] DOB: [birth year] DOA: 03/05/2015 PCP: [name]   Brief narrative:    81 yo female with HTN, MDS, COPD on 2-4 L O2 via [place] at home, a-fib with SSS, dementia, SNF resident who presented to [facility] ED for evaluation of several days duration of progressive dyspnea that was noted by nursing staff, with exertion and even at rest. This has been associated with productive cough of clear and yellow sputum, worse with deep inspiration and with no specific alleviating factors. Pt also reports mid area chest discomfort with coughing spells, that occasionally but not consistently radiating to the back area, denies fevers, chills, no recent similar events.   In ED, VS notable for T 99.3 with HR stable and at target range, RR up to 26 bpm, BP stable. CXR notable for bilateral pleural effusion and left sided PNA. WBC 21K. Pt started on vancomycin and zosyn in ED and [facility] asked to admit for further evaluation.   Of note, Tonya [facility]kee has MDS which has previously been treated with revlimid, however, this was held in the setting of a dental infection apparently.She is currently on a taper of steroids and is supposed to be on 20m per day currently. She has chronically low Hgb/anemia due to her MDS and is to receive 2 units of PRBCs if Hgb drops below 8 (was 7.6 n admission ).She also has had persistently low platelets, thought to be due to receiving Abx for a recent dental infection.  FULL CODE status confirmed with daughter over the phone.    Assessment/Plan:    Sepsis secondary to HCAP (healthcare-associated pneumonia) - pt met criteria for sepsis with WBC 21K, RR up to 26 bpm, source PNA  - Pneumonia apparent on CXR, she was recently in the hospital and lives in a NH, ABX broadened to cover for HCAP  - continue vanc and aztreonam day #3 as WBC still elevated  -  Urinary antigens for legionella and strep pending  - Sputum culture also requested and still pending  - Blood culture X 2 obtained, follow up - continue to keep on oxygen via [place] to keep O2 sat > 92%  Acute on chronic respiratory failure, oxygen dependent at home 2-4 L via [place] secondary to COPD - now with HCAP and bilateral pleural effusions  - ABX as noted above and keep on oxygen via  - BD as needed  - IR consult for thoracentesis (left side first)  Bilateral pleural effusions - unclear etiology at this time, possibly diastolic and ? Systolic CHF but given underlying MDS, need to r/o malignant cause - IR to drain fluid and send for analysis - placed on Lasix 20 mg IV BID - ECHO requested - monitor daily weights, strict I/O - weight is 50 kg   Chest pain - Pleuritic in nature and very hard for her to localize - First Troponin negative but trending up, secondary to pleural effusions, HCAP, demand ischemia - no chest pain this AM, trop trending down  - CT chest angio requested to rule out PE - can not start AC due to bleeding risk and low Plt   MDS (myelodysplastic syndrome) with 5q deletion, anemia with Hgb < 8 - She is on chronic steroids at home, presumably she received this the day of the admission. - pt has been transfused two U PRBC on admission with Hg  up from 7.6 --> 10.8 --> 10.3 this aM - repeat CBC in AM  Thrombocytopenia - per oncology's last note, low platelets thought to be due to recent Abx for dental infection vs MDS itself  - Plt drop from 33 --> 26 --> 28 K this AM - repeat CBC in AM and transfuse if Plt < 10 K or bleeding  - Consider Oncology consult for assistance - Trend CBC  Essential hypertension - Given HCAP, holding BP medications, restart as needed - Continue metoprolol for Afib   COPD GOLD II - Minimal wheezing on exam, so pneumonia and pleural effusions likely cause of SOB - Continue spiriva - Duonebs as needed   Persistent headaches -  Chronic, monitor - Per patient's daughter, narcotics should be avoided b/c of over sedation (not surprising given cachexia)   Hyponatremia - Possibly related to acute lung disorder ongoing - Trend - Very mild and currently asymptomatic   Inadequate oral intake in the setting of acute illness  - Ensure TID - nutritionist consulted    Atrial fibrillation with SSS, CHADS 2 = 3-4  - Continue metoprolol as noted - Telemetry - Continue digoxin - not AC candidate due to low plt and propensity for bleed - daughter in agreement to avoid [facility]   DVT prophylaxis - SCD's  Code Status: Full.  Family Communication:  plan of care discussed with the patient Disposition Plan: not ready for d/c   IV access:  Peripheral IV  Procedures and diagnostic studies:    Dg Chest 2 View 03/05/2015  Bilateral pleural effusions, left greater right right with left lower lobe atelectasis/pneumonia.   Medical Consultants:  IR for thoracentesis   Other Consultants:  Nutritionist   IAnti-Infectives:   Vancomycin 7/21 --> Aztreonam 7/21 -->  Faye Ramsay, MD  [facility] Pager [phone]  If 7PM-7AM, please contact night-coverage [email or web address] Password [facility] 03/07/2015, 3:35 PM   LOS: 2 days   HPI/Subjective: No events overnight.   Objective: Filed Vitals:   03/07/15 0601 03/07/15 0837 03/07/15 1335 03/07/15 1405  BP:  160/65 151/90 167/77  Pulse:  92    Temp:  98.3 F (36.8 C)    TempSrc:  Oral    Resp:  18    Height:      Weight: 50.4 kg (111 lb 1.8 oz)     SpO2:  88% 88% 92%    Intake/Output Summary (Last 24 hours) at 03/07/15 1535 Last data filed at 03/07/15 1050  Gross per 24 hour  Intake    580 ml  Output      2 ml  Net    578 ml    Exam:   General:  Pt is alert, follows commands appropriately, in no distress   Cardiovascular: Regular rate and rhythm, no rubs, no gallops  Respiratory: scattered rhonchi   Abdomen: Soft, non tender, non distended, bowel sounds  present, no guarding  Extremities:pulses DP and PT palpable bilaterally  Data Reviewed: Basic Metabolic Panel:  Recent Labs Lab 03/05/15 1630 03/06/15 1336 03/07/15 0535  NA 133* 133* 133*  K 3.8 3.6 3.8  CL 96* 98* 96*  CO2 '28 29 27  ' GLUCOSE 123* 128* 104*  BUN 22* 16 15  CREATININE 0.85 0.64 0.52  CALCIUM 8.0* 7.8* 8.1*   Liver Function Tests:  Recent Labs Lab 03/06/15 1336  AST 24  ALT 17  ALKPHOS 61  BILITOT 2.4*  PROT 6.0*  ALBUMIN 2.1*   CBC:  Recent Labs Lab 03/02/15 1045 03/05/15 1630 03/05/15  2240 03/06/15 1336 03/07/15 0535  WBC 11.5* 21.4*  --  18.5* 22.9*  NEUTROABS  --   --  6.1 12.0*  --   HGB 8.9* 7.6*  --  10.8* 10.3*  HCT 28.0* 23.1*  --  31.6* 30.8*  MCV 92.8 91.7  --  88.5 88.8  PLT 50* 33*  --  26* 28*   Cardiac Enzymes:  Recent Labs Lab 03/05/15 2240 03/06/15 1336 03/06/15 1933 03/06/15 2344 03/07/15 0535  TROPONINI 0.05* 0.13* <0.03 <0.03 0.03     Recent Results (from the past 240 hour(s))  MRSA PCR Screening     Status: None   Collection Time: 03/05/15  8:42 PM  Result Value Ref Range Status   MRSA by PCR NEGATIVE NEGATIVE Final  Culture, blood (routine x 2) Call MD if unable to obtain prior to antibiotics being given     Status: None (Preliminary result)   Collection Time: 03/05/15 10:30 PM  Result Value Ref Range Status   Specimen Description BLOOD RIGHT ARM  Final   Special Requests   Final    BOTTLES DRAWN AEROBIC AND ANAEROBIC 10CC BLUE 8CC RED   Culture NO GROWTH < 24 HOURS  Final   Report Status PENDING  Incomplete  Culture, blood (routine x 2) Call MD if unable to obtain prior to antibiotics being given     Status: None (Preliminary result)   Collection Time: 03/05/15 10:40 PM  Result Value Ref Range Status   Specimen Description BLOOD RIGHT ARM  Final   Special Requests   Final    BOTTLES DRAWN AEROBIC AND ANAEROBIC 10CC BLUE [place] RED   Culture NO GROWTH < 24 HOURS  Final   Report Status PENDING   Incomplete     Scheduled Meds: . sodium chloride   Intravenous Once  . aztreonam  1 g Intravenous 3 times per day  . digoxin  0.125 mg Oral Daily  . famotidine  10 mg Oral BID  . FLUoxetine  30 mg Oral q morning - 10a  . furosemide  20 mg Intravenous BID  . ipratropium-albuterol  3 mL Nebulization TID  . lidocaine-EPINEPHrine      . metoprolol succinate  50 mg Oral QPM  . polyethylene glycol  17 g Oral Daily  . predniSONE  10 mg Oral Q breakfast  . tiotropium  18 mcg Inhalation Daily  . vancomycin  750 mg Intravenous Q24H   Continuous Infusions:

## 2015-03-07 NOTE — Procedures (Signed)
Successful US guided left thoracentesis. Yielded 750 ml of serous colored fluid. Pt tolerated procedure well. No immediate complications.  Specimen was sent for labs. CXR ordered.  [name] D PA-C 03/07/2015 2:03 PM

## 2015-03-08 ENCOUNTER — Inpatient Hospital Stay (HOSPITAL_COMMUNITY): Payer: Medicare Other

## 2015-03-08 LAB — PH, BODY FLUID: pH, Fluid: 7.5

## 2015-03-08 LAB — CBC
HCT: 29.8 % — ABNORMAL LOW (ref 36.0–46.0)
Hemoglobin: 10 g/dL — ABNORMAL LOW (ref 12.0–15.0)
MCH: 30.4 pg (ref 26.0–34.0)
MCHC: 33.6 g/dL (ref 30.0–36.0)
MCV: 90.6 fL (ref 78.0–100.0)
PLATELETS: 23 10*3/uL — AB (ref 150–400)
RBC: 3.29 MIL/uL — ABNORMAL LOW (ref 3.87–5.11)
RDW: 18.1 % — ABNORMAL HIGH (ref 11.5–15.5)
WBC: 21.1 10*3/uL — ABNORMAL HIGH (ref 4.0–10.5)

## 2015-03-08 LAB — BASIC METABOLIC PANEL
ANION GAP: 10 (ref 5–15)
BUN: 22 mg/dL — ABNORMAL HIGH (ref 6–20)
CO2: 28 mmol/L (ref 22–32)
Calcium: 8 mg/dL — ABNORMAL LOW (ref 8.9–10.3)
Chloride: 95 mmol/L — ABNORMAL LOW (ref 101–111)
Creatinine, Ser: 0.58 mg/dL (ref 0.44–1.00)
GFR calc non Af Amer: >60 mL/min (ref >60)
Glucose, Bld: 147 mg/dL — ABNORMAL HIGH (ref 65–99)
Potassium: 3.3 mmol/L — ABNORMAL LOW (ref 3.5–5.1)
SODIUM: 133 mmol/L — AB (ref 135–145)

## 2015-03-08 LAB — VANCOMYCIN, TROUGH: VANCOMYCIN TR: 4 ug/mL — AB (ref 10.0–20.0)

## 2015-03-08 MED ORDER — KETOROLAC TROMETHAMINE 15 MG/ML IJ SOLN
15.0000 mg | Freq: Four times a day (QID) | INTRAMUSCULAR | Status: AC | PRN
  Administered 2015-03-08 – 2015-03-12 (×8): 15 mg via INTRAVENOUS
  Filled 2015-03-08 (×8): qty 1

## 2015-03-08 MED ORDER — VANCOMYCIN HCL 500 MG IV SOLR
500.0000 mg | Freq: Two times a day (BID) | INTRAVENOUS | Status: DC
  Administered 2015-03-09 – 2015-03-10 (×3): 500 mg via INTRAVENOUS
  Filled 2015-03-08 (×5): qty 500

## 2015-03-08 MED ORDER — KETOROLAC TROMETHAMINE 30 MG/ML IJ SOLN: 30.0000 mg | Freq: Four times a day (QID) | INTRAMUSCULAR | Status: DC | PRN

## 2015-03-08 MED ORDER — POTASSIUM CHLORIDE CRYS ER 20 MEQ PO TBCR
40.0000 meq | EXTENDED_RELEASE_TABLET | Freq: Once | ORAL | Status: AC
  Administered 2015-03-08: 40 meq via ORAL
  Filled 2015-03-08: qty 2

## 2015-03-08 NOTE — Progress Notes (Signed)
PT Cancellation Note  Patient Details Name: Tonya Mckee MRN: [id number] DOB: 04-16-1929   Cancelled Treatment:    Reason Eval/Treat Not Completed: Patient at procedure or test/unavailable  Currently having foley placed;  Will follow up later today as time allows;  Otherwise, will follow up for PT tomorrow;   Thank you,  Roney Marion, [place] Pager [phone] Office [phone]    Roney Marion [facility] 03/08/2015, 10:51 AM

## 2015-03-08 NOTE — Progress Notes (Signed)
Patient had a rhythm of Sinus Brady in the 20's briefly with 3.5 second pause.  She then went back into AFIB in low 100's.  At the time of the dysrhythmia, she was having large bowel movement.  She is asymptomatic.  VS stable.  [facility] made aware.  No new orders.  Will continue to monitor patient.  [employer] RN-BC, WTA.

## 2015-03-08 NOTE — Progress Notes (Signed)
ANTIBIOTIC CONSULT NOTE - FOLLOW UP  Pharmacy Consult for Vancomycin Indication: pneumonia and rule out sepsis  Allergies  Allergen Reactions  . Donepezil Nausea And Vomiting  . Penicillins Itching and Nausea And Vomiting  . Amoxapine And Related Nausea Only  . Doxycycline Rash  . Keflex [Cephalexin] Itching  . Bupropion Other (See Comments)    "Headaches on the XL."   . Cardizem [Diltiazem Hcl] Other (See Comments)    Bradycardia/presyncope  . Ketek [Telithromycin] Itching    Dry Mouth  . Levaquin [Levofloxacin In D5w] Other (See Comments)    Insomnia  . Sulfa Antibiotics Nausea And Vomiting  . Tramadol Other (See Comments)    "Made her feel like a zombie."  . Zyrtec [Cetirizine] Other (See Comments)    "made her like a zombie"    Patient Measurements: Height: 5\' 1"  (154.9 cm) Weight: 109 lb 9.1 oz (49.7 kg) IBW/kg (Calculated) : 47.8 Adjusted Body Weight:   Vital Signs: Temp: 97.8 F (36.6 C) (07/24 1617) Temp Source: Oral (07/24 1617) BP: 143/61 mmHg (07/24 1617) Pulse Rate: 67 (07/24 1617) Intake/Output from previous day: 07/23 0701 - 07/24 0700 In: 1010 [P.O.:960; IV Piggyback:50] Out: 100 [Urine:100] Intake/Output from this shift: Total I/O In: 240 [P.O.:240] Out: 0   Labs:  Recent Labs  03/06/15 1336 03/07/15 0535 03/08/15 0529  WBC 18.5* 22.9* 21.1*  HGB 10.8* 10.3* 10.0*  PLT 26* 28* 23*  CREATININE 0.64 0.52 0.58   Estimated Creatinine Clearance: 39.5 mL/min (by C-G formula based on Cr of 0.58).  Recent Labs  03/08/15 1732  [place] 4*     Microbiology: Recent Results (from the past 720 hour(s))  Blood culture (routine x 2)     Status: None   Collection Time: 02/18/15  2:41 AM  Result Value Ref Range Status   Specimen Description BLOOD BLOOD RIGHT FOREARM  Final   Special Requests BOTTLES DRAWN AEROBIC AND ANAEROBIC 5CC  Final   Culture   Final    NO GROWTH 5 DAYS Performed at [facility]    Report Status 02/23/2015  FINAL  Final  Urine culture     Status: None   Collection Time: 02/18/15  2:43 AM  Result Value Ref Range Status   Specimen Description URINE, CLEAN CATCH  Final   Special Requests NONE  Final   Culture   Final    NO GROWTH 1 DAY Performed at [facility]    Report Status 02/19/2015 FINAL  Final  Blood culture (routine x 2)     Status: None   Collection Time: 02/18/15  2:46 AM  Result Value Ref Range Status   Specimen Description BLOOD RIGHT ANTECUBITAL  Final   Special Requests BOTTLES DRAWN AEROBIC AND ANAEROBIC 5CC  Final   Culture   Final    NO GROWTH 5 DAYS Performed at [facility]    Report Status 02/23/2015 FINAL  Final  MRSA PCR Screening     Status: None   Collection Time: 02/19/15  8:16 PM  Result Value Ref Range Status   MRSA by PCR NEGATIVE NEGATIVE Final    Comment:        The GeneXpert MRSA Assay (FDA approved for NASAL specimens only), is one component of a comprehensive MRSA colonization surveillance program. It is not intended to diagnose MRSA infection nor to guide or monitor treatment for MRSA infections.   MRSA PCR Screening     Status: None   Collection Time: 03/05/15  8:42 PM  Result Value Ref Range Status   MRSA by PCR NEGATIVE NEGATIVE Final    Comment:        The GeneXpert MRSA Assay (FDA approved for NASAL specimens only), is one component of a comprehensive MRSA colonization surveillance program. It is not intended to diagnose MRSA infection nor to guide or monitor treatment for MRSA infections.   Culture, blood (routine x 2) Call MD if unable to obtain prior to antibiotics being given     Status: None (Preliminary result)   Collection Time: 03/05/15 10:30 PM  Result Value Ref Range Status   Specimen Description BLOOD RIGHT ARM  Final   Special Requests   Final    BOTTLES DRAWN AEROBIC AND ANAEROBIC 10CC BLUE 8CC RED   Culture NO GROWTH 3 DAYS  Final   Report Status PENDING  Incomplete  Culture, blood (routine x 2)  Call MD if unable to obtain prior to antibiotics being given     Status: None (Preliminary result)   Collection Time: 03/05/15 10:40 PM  Result Value Ref Range Status   Specimen Description BLOOD RIGHT ARM  Final   Special Requests   Final    BOTTLES DRAWN AEROBIC AND ANAEROBIC 10CC BLUE [place] RED   Culture NO GROWTH 3 DAYS  Final   Report Status PENDING  Incomplete  Culture, body fluid-bottle     Status: None (Preliminary result)   Collection Time: 03/07/15  2:16 PM  Result Value Ref Range Status   Specimen Description FLUID LEFT PLEURAL  Final   Special Requests   Final    BOTTLES DRAWN AEROBIC AND ANAEROBIC 10MLS LEFT CHEST   Culture NO GROWTH < 24 HOURS  Final   Report Status PENDING  Incomplete  Gram stain     Status: None (Preliminary result)   Collection Time: 03/07/15  2:16 PM  Result Value Ref Range Status   Specimen Description FLUID LEFT PLEURAL  Final   Special Requests BAA 10MLS LEFT CHEST  Final   Gram Stain RARE POLYMORPHONUCLEAR NO ORGANISMS SEEN   Final   Report Status PENDING  Incomplete    Anti-infectives    Start     Dose/Rate Route Frequency Ordered Stop   03/06/15 1800  vancomycin (VANCOCIN) IVPB 750 mg/150 ml premix     750 mg 150 mL/hr over 60 Minutes Intravenous Every 24 hours 03/05/15 2322     03/05/15 2330  aztreonam (AZACTAM) 1 g in dextrose 5 % 50 mL IVPB     1 g 100 mL/hr over 30 Minutes Intravenous 3 times per day 03/05/15 2322     03/05/15 1830  vancomycin (VANCOCIN) IVPB 1000 mg/200 mL premix     1,000 mg 200 mL/hr over 60 Minutes Intravenous  Once 03/05/15 1815 03/05/15 1933      Assessment: 81yo female with pneumonia and R/O sepsis.  Cr has improved since admission and cultures are negative to date.  Vancomycin trough is subtherapeutic at 4; all doses have been hung appropriately.  Dosage will require adjustment  Goal of Therapy:  Vancomycin trough level 15-20 mcg/ml  Plan:  Change Vancomycin to 500mg  IV q12, next dose at 6AM. Watch  renal function closely  Gracy Bruins, PharmD Clinical Pharmacist [facility]

## 2015-03-08 NOTE — Evaluation (Signed)
Physical Therapy Evaluation Patient Details Name: Tonya Mckee MRN: [id number] DOB: 1929-07-31 Today's Date: 03/08/2015   History of Present Illness  81 yo female with HTN, MDS, COPD on 2-4 L O2 via [place] at home, a-fib with SSS, dementia, SNF resident who presented to [facility] ED for evaluation of several days duration of progressive dyspnea that was noted by nursing staff, with exertion and even at rest. This has been associated with productive cough of clear and yellow sputum, worse with deep inspiration and with no specific alleviating factors. Pt also reports mid area chest discomfort with coughing spells  Clinical Impression  Pt admitted with above diagnosis. Pt currently with functional limitations due to the deficits listed below (see PT Problem List).  Pt will benefit from skilled PT to increase their independence and safety with mobility to allow discharge to the venue listed below.       Follow Up Recommendations SNF;Supervision/Assistance - 24 hour    Equipment Recommendations  None recommended by PT    Recommendations for Other Services OT consult     Precautions / Restrictions Precautions Precautions: Fall Restrictions Weight Bearing Restrictions: No      Mobility  Bed Mobility Overal bed mobility: Needs Assistance Bed Mobility: Supine to Sit     Supine to sit: Max assist     General bed mobility comments: assist with trunk and LEs to get to EOB  Cues for technique for bed mobility.  Transfers Overall transfer level: Needs assistance Equipment used: 1 person hand held assist Transfers: Sit to/from Stand Sit to Stand: Mod assist Stand pivot transfers: Mod assist       General transfer comment: Heavy mod assist to rise to keep weight forward over feet as pt has a significant posterior lean; small pivot steps bed to chair with 2 person handheld assist  Ambulation/Gait Ambulation/Gait assistance: +2 physical assistance Ambulation Distance (Feet):  (pivot steps bd  to chair) Assistive device: 2 person hand held assist Gait Pattern/deviations: Shuffle     General Gait Details: small, almost festinating pivot steps to move from bed to chair; posterior lean throughout  Stairs            Wheelchair Mobility    Modified Rankin (Stroke Patients Only)       Balance             Standing balance-Leahy Scale: Poor                               Pertinent Vitals/Pain Pain Assessment: No/denies pain    Home Living Family/patient expects to be discharged to:: Skilled nursing facility Living Arrangements: Alone                    Prior Function Level of Independence: Independent with assistive device(s) (as of 7/7)         Comments: using RW     Hand Dominance   Dominant Hand: Right    Extremity/Trunk Assessment   Upper Extremity Assessment: Overall WFL for tasks assessed           Lower Extremity Assessment: Generalized weakness (decr coordination with steps)      Cervical / Trunk Assessment: Kyphotic  Communication   Communication: HOH  Cognition Arousal/Alertness: Awake/alert Behavior During Therapy: WFL for tasks assessed/performed Overall Cognitive Status: Impaired/Different from baseline Area of Impairment: Orientation Orientation Level: Disoriented to;Place;Time;Situation  General Comments: Repeated "I should be getting back to my room" throughout the session    General Comments      Exercises        Assessment/Plan    PT Assessment Patient needs continued PT services  PT Diagnosis Difficulty walking;Generalized weakness   PT Problem List Decreased strength;Decreased mobility;Decreased balance;Decreased activity tolerance;Decreased knowledge of use of DME  PT Treatment Interventions DME instruction;Gait training;Functional mobility training;Therapeutic activities;Therapeutic exercise;Patient/family education;Balance training   PT Goals (Current goals can be  found in the Care Plan section) Acute Rehab PT Goals Patient Stated Goal: not stated PT Goal Formulation: With patient/family Time For Goal Achievement: 03/22/15 Potential to Achieve Goals: Good    Frequency Min 3X/week   Barriers to discharge        Co-evaluation               End of Session Equipment Utilized During Treatment: Oxygen Activity Tolerance: Patient limited by fatigue Patient left: in chair;with call bell/phone within reach;with family/visitor present Nurse Communication: Mobility status         Time: [id number] PT Time Calculation (min) (ACUTE ONLY): 29 min   Charges:   PT Evaluation $Initial PT Evaluation Tier I: 1 Procedure PT Treatments $Therapeutic Activity: 8-22 mins   PT G Codes:        Quin Hoop 03/08/2015, 5:02 PM  Roney Marion, [place] Pager [phone] Office [phone]

## 2015-03-08 NOTE — Progress Notes (Signed)
Echocardiogram 2D Echocardiogram has been performed.  Tonya Mckee 03/08/2015, 9:16 AM

## 2015-03-08 NOTE — Progress Notes (Addendum)
Patient ID: Tonya Mckee, female   DOB: Oct 08, 1928, 81 y.o.   MRN: [id number]  [facility] PROGRESS NOTE  Tonya Mckee SWF:[id number] DOB: August 17, 1928 DOA: 03/05/2015 PCP: [name]   Brief narrative:    81 yo female with HTN, MDS, COPD on 2-4 L O2 via [place] at home, a-fib with SSS, dementia, SNF resident who presented to [facility] ED for evaluation of several days duration of progressive dyspnea that was noted by nursing staff, with exertion and even at rest. This has been associated with productive cough of clear and yellow sputum, worse with deep inspiration and with no specific alleviating factors. Pt also reports mid area chest discomfort with coughing spells, that occasionally but not consistently radiating to the back area, denies fevers, chills, no recent similar events.   In ED, VS notable for T 99.3 with HR stable and at target range, RR up to 26 bpm, BP stable. CXR notable for bilateral pleural effusion and left sided PNA. WBC 21K. Pt started on vancomycin and zosyn in ED and [facility] asked to admit for further evaluation.   Of note, [name] has MDS which has previously been treated with revlimid, however, this was held in the setting of a dental infection apparently.She is currently on a taper of steroids and is supposed to be on $Rem'10mg'lSNN$  per day currently. She has chronically low Hgb/anemia due to her MDS and is to receive 2 units of PRBCs if Hgb drops below 8 (was 7.6 n admission ).She also has had persistently low platelets, thought to be due to receiving Abx for a recent dental infection.  FULL CODE status confirmed with daughter over the phone.    Assessment/Plan:    Sepsis secondary to HCAP (healthcare-associated pneumonia) - pt met criteria for sepsis with WBC 21K, RR up to 26 bpm, source PNA  - Pneumonia apparent on CXR, she was recently in the hospital and lives in a NH, ABX broadened to cover for HCAP  - continue vanc and aztreonam day #4 - Urinary antigens for  legionella and strep negative to date  - Sputum culture also requested and negative today  - Blood culture X 2 obtained, follow up - continue to keep on oxygen via [place] to keep O2 sat > 92%  Acute on chronic respiratory failure, oxygen dependent at home 2-4 L via [place] secondary to COPD - now with HCAP and bilateral pleural effusions, acute on chronic diastolic CHF  - ABX as noted above and keep on oxygen via [place] - BD as needed  - IR consult for thoracentesis (left side first), now status post left thoracentesis 7/23, prelim report exudate   Bilateral pleural effusions - unclear etiology at this time, ? acute on chronic diastolic CHF but given underlying MDS, need to r/o malignant cause - IR assistance appreciated for doing thoracentesis on the left side, 750 cc fluid removed, exudate by analysis, final report pending  - placed on Lasix 20 mg IV BID - ECHO requested, will need to follow up on results  - monitor daily weights, strict I/O - weight trend: 50 kg --> 49 kg  - repeat CXR requested this AM for follow up   Hypokalemia - continue to supplement and repeat BMP in AM  Back pain - unclear if related to acute illness vs MDS - no focal neurological findings on exam  - provide analgesia as needed for now - try to get OOB to chair   Chest pain - Pleuritic in nature and very  hard for her to localize - First Troponin negative but trending up, secondary to pleural effusions, HCAP, demand ischemia - no chest pain this AM, trop trending down and WNL x 3 sets  - CT chest angio negative for PE but notable for bilateral pleural effusions worse on the left side - can not start AC due to bleeding risk and low Plt   MDS (myelodysplastic syndrome) with 5q deletion, anemia with Hgb < 8 - She is on chronic steroids at home, presumably she received this the day of the admission. - pt has been transfused two U PRBC on admission with Hg up from 7.6 --> 10.8 --> 10.3 this aM - repeat CBC in AM - Dr.  Alvy Bimler notified via EPIC order   Thrombocytopenia - per oncology's last note, low platelets thought to be due to recent Abx for dental infection vs MDS itself  - Plt drop from 33 --> 26 --> 28 --> 23 K this AM - repeat CBC in AM and transfuse if Plt < 10 K or bleeding   Essential hypertension - Given HCAP, holding BP medications, restart as needed - Continue metoprolol for Afib   COPD GOLD II - Minimal wheezing on exam, so pneumonia and pleural effusions likely cause of SOB - Continue spiriva - Duonebs as needed   Persistent headaches - Chronic, monitor - Per patient's daughter, narcotics should be avoided b/c of over sedation (not surprising given cachexia)   Hyponatremia - Possibly related to acute lung disorder ongoing - Trend - Very mild and currently asymptomatic   Inadequate oral intake in the setting of acute illness  - Ensure TID - nutritionist consulted    Atrial fibrillation with SSS, CHADS 2 = 3-4  - Continue metoprolol as needed for now  - Continue digoxin - not AC candidate due to low plt and propensity for bleed - daughter in agreement to avoid [facility]   DVT prophylaxis - SCD's  Code Status: Full.  Family Communication:  plan of care discussed with the patient and daughter at bedside at length, discussed current studies and results, high risk for decompensations given complexity of medical conditions outlined above. Recommended palliative care consult and discussion with family about DNR code status. Family agreeable and PCT consulted.  Disposition Plan: not ready for d/c   IV access:  Peripheral IV  Procedures and diagnostic studies:    Dg Chest 2 View 03/05/2015  Bilateral pleural effusions, left greater right right with left lower lobe atelectasis/pneumonia.   Medical Consultants:  IR for thoracentesis  PCT  Other Consultants:  Nutritionist   IAnti-Infectives:   Vancomycin 7/21 --> Aztreonam 7/21 -->  Faye Ramsay,  MD  [facility] Pager [phone]  If 7PM-7AM, please contact night-coverage [email or web address] Password [facility] 03/08/2015, 7:57 AM   LOS: 3 days   HPI/Subjective: No events overnight. Still with headache and back pain.   Objective: Filed Vitals:   03/07/15 1735 03/07/15 1943 03/08/15 0421 03/08/15 0727  BP: 133/78 116/69 141/56   Pulse: 124 101 83   Temp: 98.2 F (36.8 C) 98.3 F (36.8 C) 99.1 F (37.3 C)   TempSrc: Oral Oral Oral   Resp: _0 Height:      Weight:   49.7 kg (109 lb 9.1 oz)   SpO2: 91% 97% 96% 96%    Intake/Output Summary (Last 24 hours) at 03/08/15 0757 Last data filed at 03/08/15 0641  Gross per 24 hour  Intake    770 ml  Output  100 ml  Net    670 ml    Exam:   General:  Pt is alert, follows commands appropriately, in no distress   Cardiovascular: Irregular rate and rhythm, no rubs, no gallops, SEM 2/6  Respiratory: scattered rhonchi   Abdomen: Soft, non tender, non distended, bowel sounds present, no guarding  Extremities:pulses DP and PT palpable bilaterally  Data Reviewed: Basic Metabolic Panel:  Recent Labs Lab 03/05/15 1630 03/06/15 1336 03/07/15 0535 03/08/15 0529  NA 133* 133* 133* 133*  K 3.8 3.6 3.8 3.3*  CL 96* 98* 96* 95*  CO2 _0 GLUCOSE 123* 128* 104* 147*  BUN 22* 16 15 22*  CREATININE 0.85 0.64 0.52 0.58  CALCIUM 8.0* 7.8* 8.1* 8.0*   Liver Function Tests:  Recent Labs Lab 03/06/15 1336  AST 24  ALT 17  ALKPHOS 61  BILITOT 2.4*  PROT 6.0*  ALBUMIN 2.1*   CBC:  Recent Labs Lab 03/02/15 1045 03/05/15 1630 03/05/15 2240 03/06/15 1336 03/07/15 0535 03/08/15 0529  WBC 11.5* 21.4*  --  18.5* 22.9* 21.1*  NEUTROABS  --   --  6.1 12.0*  --   --   HGB 8.9* 7.6*  --  10.8* 10.3* 10.0*  HCT 28.0* 23.1*  --  31.6* 30.8* 29.8*  MCV 92.8 91.7  --  88.5 88.8 90.6  PLT 50* 33*  --  26* 28* 23*   Cardiac Enzymes:  Recent Labs Lab 03/05/15 2240 03/06/15 1336 03/06/15 1933 03/06/15 2344  03/07/15 0535  TROPONINI 0.05* 0.13* <0.03 <0.03 0.03     Recent Results (from the past 240 hour(s))  MRSA PCR Screening     Status: None   Collection Time: 03/05/15  8:42 PM  Result Value Ref Range Status   MRSA by PCR NEGATIVE NEGATIVE Final  Culture, blood (routine x 2) Call MD if unable to obtain prior to antibiotics being given     Status: None (Preliminary result)   Collection Time: 03/05/15 10:30 PM  Result Value Ref Range Status   Specimen Description BLOOD RIGHT ARM  Final   Special Requests   Final    BOTTLES DRAWN AEROBIC AND ANAEROBIC 10CC BLUE 8CC RED   Culture NO GROWTH < 24 HOURS  Final   Report Status PENDING  Incomplete  Culture, blood (routine x 2) Call MD if unable to obtain prior to antibiotics being given     Status: None (Preliminary result)   Collection Time: 03/05/15 10:40 PM  Result Value Ref Range Status   Specimen Description BLOOD RIGHT ARM  Final   Special Requests   Final    BOTTLES DRAWN AEROBIC AND ANAEROBIC 10CC BLUE [place] RED   Culture NO GROWTH < 24 HOURS  Final   Report Status PENDING  Incomplete     Scheduled Meds: . aztreonam  1 g Intravenous 3 times per day  . digoxin  0.125 mg Oral Daily  . famotidine  10 mg Oral BID  . FLUoxetine  30 mg Oral q morning - 10a  . furosemide  20 mg Intravenous BID  . levalbuterol  1.25 mg Nebulization TID PC & HS  . metoprolol succinate  50 mg Oral QPM  . polyethylene glycol  17 g Oral Daily  . predniSONE  10 mg Oral Q breakfast  . tiotropium  18 mcg Inhalation Daily  . vancomycin  750 mg Intravenous Q24H   Continuous Infusions:

## 2015-03-09 DIAGNOSIS — Z515 Encounter for palliative care: Secondary | ICD-10-CM

## 2015-03-09 LAB — BASIC METABOLIC PANEL
Anion gap: 7 (ref 5–15)
BUN: 21 mg/dL — AB (ref 6–20)
CALCIUM: 7.9 mg/dL — AB (ref 8.9–10.3)
CO2: 28 mmol/L (ref 22–32)
Chloride: 97 mmol/L — ABNORMAL LOW (ref 101–111)
Creatinine, Ser: 0.55 mg/dL (ref 0.44–1.00)
GFR calc Af Amer: >60 mL/min (ref >60)
GFR calc non Af Amer: >60 mL/min (ref >60)
Glucose, Bld: 87 mg/dL (ref 65–99)
POTASSIUM: 4.1 mmol/L (ref 3.5–5.1)
Sodium: 132 mmol/L — ABNORMAL LOW (ref 135–145)

## 2015-03-09 LAB — CBC
HCT: 28.9 % — ABNORMAL LOW (ref 36.0–46.0)
HEMOGLOBIN: 9.7 g/dL — AB (ref 12.0–15.0)
MCH: 30.2 pg (ref 26.0–34.0)
MCHC: 33.6 g/dL (ref 30.0–36.0)
MCV: 90 fL (ref 78.0–100.0)
Platelets: 21 10*3/uL — CL (ref 150–400)
RBC: 3.21 MIL/uL — ABNORMAL LOW (ref 3.87–5.11)
RDW: 17.9 % — ABNORMAL HIGH (ref 11.5–15.5)
WBC: 17.2 10*3/uL — ABNORMAL HIGH (ref 4.0–10.5)

## 2015-03-09 LAB — GRAM STAIN

## 2015-03-09 LAB — LEGIONELLA ANTIGEN, URINE

## 2015-03-09 MED ORDER — LEVALBUTEROL HCL 1.25 MG/0.5ML IN NEBU
1.2500 mg | INHALATION_SOLUTION | Freq: Two times a day (BID) | RESPIRATORY_TRACT | Status: DC
  Administered 2015-03-09 – 2015-03-12 (×7): 1.25 mg via RESPIRATORY_TRACT
  Filled 2015-03-09 (×11): qty 0.5

## 2015-03-09 NOTE — Care Management Important Message (Signed)
Important Message  Patient Details  Name: Tonya Mckee MRN: [id number] Date of Birth: 05-26-1929   Medicare Important Message Given:  Yes-second notification given    Pricilla Handler 03/09/2015, 2:48 PM

## 2015-03-09 NOTE — Consult Note (Signed)
Consultation Note Date: 03/09/2015   Patient Name: Tonya Mckee  DOB: 04-17-1929  MRN: [id number]  Age / Sex: 81 y.o., female   PCP: Carlos Levering, PA-C Referring Physician: Theodis Blaze, MD  Reason for Consultation: Establishing goals of care and Psychosocial/spiritual support  Palliative Care Assessment and Plan Summary of Established Goals of Care and Medical Treatment Preferences    Palliative Care Discussion Held Today:    This NP Wadie Lessen reviewed medical records, received report from team, assessed the patient and then meet at the patient's bedside and then spoke by telephone with daughter Tonya Mckee  to discuss diagnosis, prognosis, GOC, EOL wishes disposition and options.  A detailed discussion was had today regarding advanced directives.  Concepts specific to code status, artifical feeding and hydration, continued IV antibiotics and rehospitalization was had.  The difference between a aggressive medical intervention path  and a palliative comfort care path for this patient at this time was had.  Values and goals of care important to patient and family were attempted to be elicited.  Concept of Hospice and Palliative Care were discussed  Natural trajectory and expectations at EOL were discussed.  Questions and concerns addressed.   Family encouraged to call with questions or concerns.  PMT will continue to support holistically.   Per daughter Tonya Mckee patient has had  -continued slow physical and functional decline over the  past  8 weeks, she has missed a few of her appointments with Dr Alvy Bimler   Primary Decision Maker: No documented HPOA but patient tells me Tonya Mckee is her main support person although she has four children, basically they all work together    Goals of Care/Code Status/Advance Care Planning:   Code Status:  Partial; no compressions,shock or intubation  Artificial feeding: not now or in the future  Continue to treat the treatable.  Pateitn and family  struggle with the decision to continue treatment for MDD.  All are hopeful for improvement but are realistic and understand with her multiple co-morbidities and visable physical and functional decline that her overall long term prognosis is poor.  Symptom Management:    Bowel regiment: Senna-s one tablet qhs prn  Pain/Dyspnea: Morphine 1 mg IV every 3 hrs prn   Psycho-social/Spiritual:    Support System: family  Her daughter Tonya Mckee is out of town but reachable by phone.  Daughter Tonya Mckee has been vi sting her during this hospital stay.  PMT will continue to support holistically   Prognosis:  Dependant on decisions regarding life prolonging treatmetns  Discharge Planning:  Pending       Chief Complaint:  Chest/back pain, SOB  History of Present Illness:  81yo woman with PMH of MDS/Dr Alvy Bimler, HTN, COPD on home O2 (2-4L), Afib with SSS, malnutrition, GERD, mild dementia, chronic headaches, arthritis, nursing home resident who presents for chest pain and SOB for a few days.   She was recently in the hospital this month for a COPD exacerbation. On CXR in the ED, she was found to have bilateral pleural effusions and a left sided pneumonia. Her WBC is chronically elevated, but is doubled today to 24.  She was given vancomycin and zosyn in the ED.   Admitted for stabilization.  Patient and family faced with advanced directive decisions and anticipatory care needs.   Primary Diagnoses  Present on Admission:  . Atrial fibrillation . Protein calorie malnutrition . Hyponatremia . Persistent headaches . MDS (myelodysplastic syndrome) with 5q deletion . Essential hypertension . COPD GOLD II  Palliative Review of Systems:    -weakness, poor appetite   I have reviewed the medical record, interviewed the patient and family, and examined the patient. The following aspects are pertinent.  Past Medical History  Diagnosis Date  . COPD (chronic obstructive pulmonary disease)   .  Hypertension   . PONV (postoperative nausea and vomiting)   . Dysrhythmia     rapid heart rate  . Stroke 2010    mini stroke  . Pneumonia 2009  . Shortness of breath   . GERD (gastroesophageal reflux disease)     long time ago  . Depression   . DEMENTIA     borderline  . Depression 01/02/2012  . Anemia 01/02/2012  . Artery stenosis 10/30/12    Moderate to severe left extracranial vertebral  . Cancer 20years ago+ 1999    lt breast  left  . SCC (squamous cell carcinoma), leg     Right  LE Dr. [place] Crane  . Arthritis     Osteo-  Spine and Knees  . Vitamin B12 deficiency   . Vertigo, benign positional   . Memory loss   . Blepharospasm     Left eye  . Venous stasis ulcers 2012    Left LE due to trauma  . Cellulitis Jan 01, 2012    Left UE  . Carotid artery occlusion     Bruit- RIGHT  . Occlusion and stenosis of carotid artery without mention of cerebral infarction 11/06/2012  . Persistent headaches 02/26/2014  . Chronic respiratory failure     on NCO2    History   Social History  . Marital Status: Widowed    Spouse Name: N/A  . Number of Children: N/A  . Years of Education: N/A   Social History Main Topics  . Smoking status: Former Smoker -- 0.50 packs/day for 68 years    Types: Cigarettes    Quit date: 09/15/2014  . Smokeless tobacco: Never Used  . Alcohol Use: No  . Drug Use: No  . Sexual Activity: Not Currently   Other Topics Concern  . None   Social History Narrative   Family History  Problem Relation Age of Onset  . Adopted: Yes   Scheduled Meds: . aztreonam  1 g Intravenous 3 times per day  . digoxin  0.125 mg Oral Daily  . famotidine  10 mg Oral BID  . FLUoxetine  30 mg Oral q morning - 10a  . furosemide  20 mg Intravenous BID  . levalbuterol  1.25 mg Nebulization BID  . metoprolol succinate  50 mg Oral QPM  . polyethylene glycol  17 g Oral Daily  . predniSONE  10 mg Oral Q breakfast  . tiotropium  18 mcg Inhalation Daily  . vancomycin  500 mg  Intravenous Q12H   Continuous Infusions:  PRN Meds:.acetaminophen, ketorolac, levalbuterol, menthol-cetylpyridinium, metoprolol, morphine injection, sorbitol Medications Prior to Admission:  Prior to Admission medications   Medication Sig Start Date End Date Taking? Authorizing Provider  acetaminophen (TYLENOL) 325 MG tablet Take 650 mg by mouth every 6 (six) hours as needed for moderate pain (pain).    Yes Historical Provider, MD  acetaminophen-codeine (TYLENOL #3) 300-30 MG per tablet Take 1-2 tablets by mouth every 4 (four) hours as needed. pain 12/06/14  Yes Historical Provider, MD  albuterol (PROVENTIL HFA;VENTOLIN HFA) 108 (90 BASE) MCG/ACT inhaler Inhale 2 puffs into the lungs every 6 (six) hours as needed for wheezing or shortness of breath (wheezing).    Yes  Historical Provider, MD  amLODipine (NORVASC) 5 MG tablet Take 1 tablet (5 mg total) by mouth daily. 02/24/15  Yes Nita Sells, MD  calcium carbonate (TUMS - DOSED IN MG ELEMENTAL CALCIUM) 500 MG chewable tablet Chew 1 tablet by mouth daily as needed for indigestion or heartburn.   Yes Historical Provider, MD  chlorhexidine (PERIDEX) 0.12 % solution 15 mLs by Mouth Rinse route 2 (two) times daily. 09/17/14  Yes Robbie Lis, MD  Cholecalciferol (VITAMIN D) 2000 UNITS tablet Take 2,000 Units by mouth daily.   Yes Historical Provider, MD  digoxin (LANOXIN) 0.125 MG tablet Take 1 tablet (0.125 mg total) by mouth daily. 02/24/15  Yes Nita Sells, MD  ENSURE PLUS (ENSURE PLUS) LIQD Take 237 mLs by mouth daily.   Yes Historical Provider, MD  famotidine (PEPCID AC) 10 MG tablet Take 1 tablet (10 mg total) by mouth 2 (two) times daily. 09/17/14  Yes Robbie Lis, MD  FLUoxetine (PROZAC) 10 MG capsule Take 3 capsules (30 mg total) by mouth every morning. 09/17/14  Yes Robbie Lis, MD  ipratropium-albuterol (DUONEB) 0.5-2.5 (3) MG/3ML SOLN Take 3 mLs by nebulization 2 (two) times daily as needed (wheezing). 09/17/14  Yes Robbie Lis,  MD  lidocaine (LIDODERM) 5 % Place 1 patch onto the skin daily. Remove & Discard patch within 12 hours or as directed by MD   Yes Historical Provider, MD  LORazepam (ATIVAN) 0.5 MG tablet Take 1 tablet (0.5 mg total) by mouth every 8 (eight) hours as needed for anxiety (anxiety). 09/17/14  Yes Robbie Lis, MD  losartan (COZAAR) 25 MG tablet Take 25 mg by mouth daily.   Yes Historical Provider, MD  menthol-cetylpyridinium (CEPACOL) 3 MG lozenge Take 1 lozenge (3 mg total) by mouth as needed for sore throat. 08/01/14  Yes Nishant Dhungel, MD  metoprolol succinate (TOPROL-XL) 50 MG 24 hr tablet Take 50 mg by mouth every evening. Take with or immediately following a meal.   Yes Historical Provider, MD  ondansetron (ZOFRAN) 4 MG tablet Take 1 tablet (4 mg total) by mouth every 6 (six) hours as needed for nausea. 09/17/14  Yes Robbie Lis, MD  polyethylene glycol [facility] / Floria Raveling) packet Take 17 g by mouth daily. 02/24/15  Yes Nita Sells, MD  predniSONE (DELTASONE) 10 MG tablet Take 10 mg by mouth daily with breakfast.   Yes Historical Provider, MD  sorbitol 70 % SOLN Take 30 mLs by mouth daily as needed for moderate constipation. 02/24/15  Yes Nita Sells, MD  tiotropium (SPIRIVA) 18 MCG inhalation capsule Place 18 mcg into inhaler and inhale daily.   Yes Historical Provider, MD  trolamine salicylate (ASPERCREME) 10 % cream Apply 1 application topically 2 (two) times daily.   Yes Historical Provider, MD  predniSONE (DELTASONE) 20 MG tablet Take 3 tablets (60 mg total) by mouth daily before breakfast. Patient not taking: Reported on 03/05/2015 02/24/15   Nita Sells, MD   Allergies  Allergen Reactions  . Donepezil Nausea And Vomiting  . Penicillins Itching and Nausea And Vomiting  . Amoxapine And Related Nausea Only  . Doxycycline Rash  . Keflex [Cephalexin] Itching  . Bupropion Other (See Comments)    "Headaches on the XL."   . Cardizem [Diltiazem Hcl] Other (See Comments)      Bradycardia/presyncope  . Ketek [Telithromycin] Itching    Dry Mouth  . Levaquin [Levofloxacin In D5w] Other (See Comments)    Insomnia  . Sulfa Antibiotics Nausea And Vomiting  .  Tramadol Other (See Comments)    "Made her feel like a zombie."  . Zyrtec [Cetirizine] Other (See Comments)    "made her like a zombie"   CBC:    Component Value Date/Time   WBC 17.2* 03/09/2015 0608   WBC 11.5* 03/02/2015 1045   HGB 9.7* 03/09/2015 0608   HGB 8.9* 03/02/2015 1045   HCT 28.9* 03/09/2015 0608   HCT 28.0* 03/02/2015 1045   PLT 21* 03/09/2015 0608   PLT 50* 03/02/2015 1045   MCV 90.0 03/09/2015 0608   MCV 92.8 03/02/2015 1045   NEUTROABS 12.0* 03/06/2015 1336   NEUTROABS 2.3 12/19/2014 1035   LYMPHSABS 1.7 03/06/2015 1336   LYMPHSABS 2.6 12/19/2014 1035   MONOABS 4.8* 03/06/2015 1336   MONOABS 0.8 12/19/2014 1035   EOSABS 0.0 03/06/2015 1336   EOSABS 0.2 12/19/2014 1035   BASOSABS 0.0 03/06/2015 1336   BASOSABS 0.0 12/19/2014 1035   Comprehensive Metabolic Panel:    Component Value Date/Time   NA 132* 03/09/2015 0608   NA 142 02/12/2015 1141   K 4.1 03/09/2015 0608   K 4.9 02/12/2015 1141   CL 97* 03/09/2015 0608   CO2 28 03/09/2015 0608   CO2 25 02/12/2015 1141   BUN 21* 03/09/2015 0608   BUN 18.6 02/12/2015 1141   CREATININE 0.55 03/09/2015 0608   CREATININE 0.9 02/12/2015 1141   GLUCOSE 87 03/09/2015 0608   GLUCOSE 102 02/12/2015 1141   CALCIUM 7.9* 03/09/2015 0608   CALCIUM 8.9 02/12/2015 1141   AST 24 03/06/2015 1336   AST 12 02/12/2015 1141   ALT 17 03/06/2015 1336   ALT 6 02/12/2015 1141   ALKPHOS 61 03/06/2015 1336   ALKPHOS 61 02/12/2015 1141   BILITOT 2.4* 03/06/2015 1336   BILITOT 0.67 02/12/2015 1141   PROT 6.0* 03/06/2015 1336   PROT 6.5 02/12/2015 1141   ALBUMIN 2.1* 03/06/2015 1336   ALBUMIN 3.2* 02/12/2015 1141    Physical Exam:  Vital Signs: BP 146/66 mmHg  Pulse 78  Temp(Src) 98.2 F (36.8 C) (Oral)  Resp 20  Ht 5\' 1"  (1.549 m)   Wt 50.2 kg (110 lb 10.7 oz)  BMI 20.92 kg/m2  SpO2 92% SpO2: SpO2: 92 % O2 Device: O2 Device: Nasal Cannula O2 Flow Rate: O2 Flow Rate (L/min): 3.5 L/min Intake/output summary:  Intake/Output Summary (Last 24 hours) at 03/09/15 1339 Last data filed at 03/09/15 0900  Gross per 24 hour  Intake    530 ml  Output   1650 ml  Net  -1120 ml   LBM: Last BM Date: 03/08/15 Baseline Weight: Weight: 50.9 kg (112 lb 3.4 oz) Most recent weight: Weight: 50.2 kg (110 lb 10.7 oz)  Exam Findings:   General: chronically ill appearing, weak and frail HEENT: moist buccaslmebranes CVS: RRR Resp: CTA         Palliative Performance Scale: 30 % at best                Additional Data Reviewed: Recent Labs     03/08/15  0529  03/09/15  0608  WBC  21.1*  17.2*  HGB  10.0*  9.7*  PLT  23*  21*  NA  133*  132*  BUN  22*  21*  CREATININE  0.58  0.55     Time In: 1330 Time Out: 1445 Time Total: 75 minutes  Greater than 50%  of this time was spent counseling and coordinating care related to the above assessment and plan.   Signed  by: [facility], Stanton Kidney, NP  Knox Royalty, NP  03/09/2015, 1:39 PM  Please contact Palliative Medicine Team phone at [phone] for questions and concerns.   See AMION for contact information

## 2015-03-09 NOTE — Progress Notes (Signed)
Patient ID: Tonya Mckee, female   DOB: [birth year], 81 y.o.   MRN: [id number]  [facility] PROGRESS NOTE  Tonya Mckee LKT:[id number] DOB: 03/18/1929 DOA: 03/05/2015 PCP: [name]   Brief narrative:    81 yo female with HTN, MDS, COPD on 2-4 L O2 via [place] at home, a-fib with SSS, dementia, SNF resident who presented to [facility] ED for evaluation of several days duration of progressive dyspnea that was noted by nursing staff, with exertion and even at rest. This has been associated with productive cough of clear and yellow sputum, worse with deep inspiration and with no specific alleviating factors. Pt also reports mid area chest discomfort with coughing spells, that occasionally but not consistently radiating to the back area, denies fevers, chills, no recent similar events.   In ED, VS notable for T 99.3 with HR stable and at target range, RR up to 26 bpm, BP stable. CXR notable for bilateral pleural effusion and left sided PNA. WBC 21K. Pt started on vancomycin and zosyn in ED and [facility] asked to admit for further evaluation.   Of note, [name] has MDS which has previously been treated with revlimid, however, this was held in the setting of a dental infection apparently.She is currently on a taper of steroids and is supposed to be on 44m per day currently. She has chronically low Hgb/anemia due to her MDS and is to receive 2 units of PRBCs if Hgb drops below 8 (was 7.6 n admission ).She also has had persistently low platelets, thought to be due to receiving Abx for a recent dental infection.  FULL CODE status confirmed with daughter over the phone.    Assessment/Plan:    Sepsis secondary to HCAP (healthcare-associated pneumonia) - pt met criteria for sepsis with WBC 21K, RR up to 26 bpm, source PNA  - Pneumonia apparent on CXR, she was recently in the hospital and lives in a NH, ABX broadened to cover for HCAP  - continue vanc and aztreonam day #5/7 - Urinary antigens for  legionella and strep negative to date  - Sputum culture also requested and negative today  - Blood culture X 2 obtained, follow up - continue to keep on oxygen via [place] to keep O2 sat > 92%  Acute on chronic respiratory failure, oxygen dependent at home 2-4 L via [place] secondary to COPD - now with HCAP and bilateral pleural effusions, acute on chronic diastolic CHF  - ABX as noted above and keep on oxygen via [place] - BD as needed  - IR consulted for thoracentesis (left side first), now status post left thoracentesis 7/23, prelim report exudate   Bilateral pleural effusions - unclear etiology at this time, ? acute on chronic diastolic CHF but given underlying MDS, need to r/o malignant cause - IR assistance appreciated for doing thoracentesis on the left side, 750 cc fluid removed, exudate by analysis, final report pending  - placed on Lasix 20 mg IV BID, will continue same regimen  - ECHO notable for grade II diastolic CHF  - monitor daily weights, strict I/O - weight trend: 50 kg and unchanged over the past 24 hours   Hypokalemia - supplemented and WNL  Back pain - unclear if related to acute illness vs MDS - no focal neurological findings on exam  - provide analgesia as needed for now - try to get OOB to chair   Chest pain - Pleuritic in nature and very hard for her to localize - First Troponin  negative but trending up, secondary to pleural effusions, HCAP, demand ischemia - no chest pain this AM, trop trending down and WNL x 3 sets  - CT chest angio negative for PE but notable for bilateral pleural effusions worse on the left side - can not start AC due to bleeding risk and low Plt   MDS (myelodysplastic syndrome) with 5q deletion, anemia with Hgb < 8 - She is on chronic steroids at home, presumably she received this the day of the admission. - pt has been transfused two U PRBC on admission with Hg up from 7.6 --> 10.8 --> 10.3 this aM - repeat CBC in AM - Dr. Alvy Bimler notified via  EPIC order   Thrombocytopenia - per oncology's last note, low platelets thought to be due to recent Abx for dental infection vs MDS itself  - Plt drop from 33 --> 26 --> 28 --> 23 --> 21 K this AM - repeat CBC in AM and transfuse if Plt < 10 K or bleeding   Essential hypertension - Given HCAP, holding BP medications, restart as needed - Continue metoprolol for Afib   COPD GOLD II - Minimal wheezing on exam, so pneumonia and pleural effusions likely cause of SOB - Continue spiriva - Duonebs as needed   Persistent headaches - Chronic, monitor - Per patient's daughter, narcotics should be avoided b/c of over sedation (not surprising given cachexia)   Hyponatremia - Possibly related to acute lung disorder ongoing - Trend - Very mild and currently asymptomatic   Inadequate oral intake in the setting of acute illness  - Ensure TID - nutritionist consulted    Atrial fibrillation with SSS, CHADS 2 = 3-4  - Continue metoprolol as needed for now  - Continue digoxin - not AC candidate due to low plt and propensity for bleed - daughter in agreement to avoid [facility]   DVT prophylaxis - SCD's  Code Status: Full.  Family Communication:  plan of care discussed with the patient and daughter at bedside at length, discussed current studies and results, high risk for decompensations given complexity of medical conditions outlined above. Recommended palliative care consult and discussion with family about DNR code status. Family agreeable and PCT consulted.  Disposition Plan: not ready for d/c   IV access:  Peripheral IV  Procedures and diagnostic studies:    Dg Chest 2 View 03/05/2015  Bilateral pleural effusions, left greater right right with left lower lobe atelectasis/pneumonia.   Medical Consultants:  IR for thoracentesis  PCT  Other Consultants:  Nutritionist   IAnti-Infectives:   Vancomycin 7/21 --> Aztreonam 7/21 -->  Faye Ramsay, MD  [facility] Pager  [phone]  If 7PM-7AM, please contact night-coverage [email or web address] Password [facility] 03/09/2015, 6:33 PM   LOS: 4 days   HPI/Subjective: No events overnight. Still with headache and back pain.   Objective: Filed Vitals:   03/08/15 2135 03/09/15 0523 03/09/15 1000 03/09/15 1008  BP:  165/62 146/66   Pulse:  80 78   Temp:  99 F (37.2 C) 98.2 F (36.8 C)   TempSrc:  Oral Oral   Resp:  19 20   Height:      Weight:      SpO2: 96% 97% 98% 92%    Intake/Output Summary (Last 24 hours) at 03/09/15 1833 Last data filed at 03/09/15 1700  Gross per 24 hour  Intake    710 ml  Output   2600 ml  Net  -1890 ml    Exam:   General:  Pt is alert, follows commands appropriately, in no distress   Cardiovascular: Irregular rate and rhythm, no rubs, no gallops, SEM 2/6  Respiratory: scattered rhonchi   Abdomen: Soft, non tender, non distended, bowel sounds present, no guarding  Extremities:pulses DP and PT palpable bilaterally  Data Reviewed: Basic Metabolic Panel:  Recent Labs Lab 03/05/15 1630 03/06/15 1336 03/07/15 0535 03/08/15 0529 03/09/15 0608  NA 133* 133* 133* 133* 132*  K 3.8 3.6 3.8 3.3* 4.1  CL 96* 98* 96* 95* 97*  CO2 _0 GLUCOSE 123* 128* 104* 147* 87  BUN 22* 16 15 22* 21*  CREATININE 0.85 0.64 0.52 0.58 0.55  CALCIUM 8.0* 7.8* 8.1* 8.0* 7.9*   Liver Function Tests:  Recent Labs Lab 03/06/15 1336  AST 24  ALT 17  ALKPHOS 61  BILITOT 2.4*  PROT 6.0*  ALBUMIN 2.1*   CBC:  Recent Labs Lab 03/05/15 1630 03/05/15 2240 03/06/15 1336 03/07/15 0535 03/08/15 0529 03/09/15 0608  WBC 21.4*  --  18.5* 22.9* 21.1* 17.2*  NEUTROABS  --  6.1 12.0*  --   --   --   HGB 7.6*  --  10.8* 10.3* 10.0* 9.7*  HCT 23.1*  --  31.6* 30.8* 29.8* 28.9*  MCV 91.7  --  88.5 88.8 90.6 90.0  PLT 33*  --  26* 28* 23* 21*   Cardiac Enzymes:  Recent Labs Lab 03/05/15 2240 03/06/15 1336 03/06/15 1933 03/06/15 2344 03/07/15 0535  TROPONINI 0.05* 0.13*  <0.03 <0.03 0.03     Recent Results (from the past 240 hour(s))  MRSA PCR Screening     Status: None   Collection Time: 03/05/15  8:42 PM  Result Value Ref Range Status   MRSA by PCR NEGATIVE NEGATIVE Final  Culture, blood (routine x 2) Call MD if unable to obtain prior to antibiotics being given     Status: None (Preliminary result)   Collection Time: 03/05/15 10:30 PM  Result Value Ref Range Status   Specimen Description BLOOD RIGHT ARM  Final   Special Requests   Final    BOTTLES DRAWN AEROBIC AND ANAEROBIC 10CC BLUE 8CC RED   Culture NO GROWTH < 24 HOURS  Final   Report Status PENDING  Incomplete  Culture, blood (routine x 2) Call MD if unable to obtain prior to antibiotics being given     Status: None (Preliminary result)   Collection Time: 03/05/15 10:40 PM  Result Value Ref Range Status   Specimen Description BLOOD RIGHT ARM  Final   Special Requests   Final    BOTTLES DRAWN AEROBIC AND ANAEROBIC 10CC BLUE [place] RED   Culture NO GROWTH < 24 HOURS  Final   Report Status PENDING  Incomplete     Scheduled Meds: . aztreonam  1 g Intravenous 3 times per day  . digoxin  0.125 mg Oral Daily  . famotidine  10 mg Oral BID  . FLUoxetine  30 mg Oral q morning - 10a  . furosemide  20 mg Intravenous BID  . levalbuterol  1.25 mg Nebulization BID  . metoprolol succinate  50 mg Oral QPM  . polyethylene glycol  17 g Oral Daily  . predniSONE  10 mg Oral Q breakfast  . tiotropium  18 mcg Inhalation Daily  . vancomycin  500 mg Intravenous Q12H   Continuous Infusions:

## 2015-03-09 NOTE — Progress Notes (Signed)
[place] for Vancomycin/Aztreonam Indication: HCAP  Allergies  Allergen Reactions  . Donepezil Nausea And Vomiting  . Penicillins Itching and Nausea And Vomiting  . Amoxapine And Related Nausea Only  . Doxycycline Rash  . Keflex [Cephalexin] Itching  . Bupropion Other (See Comments)    "Headaches on the XL."   . Cardizem [Diltiazem Hcl] Other (See Comments)    Bradycardia/presyncope  . Ketek [Telithromycin] Itching    Dry Mouth  . Levaquin [Levofloxacin In D5w] Other (See Comments)    Insomnia  . Sulfa Antibiotics Nausea And Vomiting  . Tramadol Other (See Comments)    "Made her feel like a zombie."  . Zyrtec [Cetirizine] Other (See Comments)    "made her like a zombie"    Patient Measurements: Height: 5\' 1"  (154.9 cm) Weight: 110 lb 10.7 oz (50.2 kg) IBW/kg (Calculated) : 47.8  Vital Signs: Temp: 99 F (37.2 C) (07/25 0523) Temp Source: Oral (07/25 0523) BP: 165/62 mmHg (07/25 0523) Pulse Rate: 80 (07/25 0523) Intake/Output from previous day: 07/24 0701 - 07/25 0700 In: 650 [P.O.:600; IV Piggyback:50] Out: 7564 [Urine:1650] Intake/Output from this shift:    Labs:  Recent Labs  03/07/15 0535 03/08/15 0529 03/09/15 0608  WBC 22.9* 21.1* 17.2*  HGB 10.3* 10.0* 9.7*  PLT 28* 23* 21*  CREATININE 0.52 0.58 0.55   Estimated Creatinine Clearance: 39.5 mL/min (by C-G formula based on Cr of 0.55).  Recent Labs  03/08/15 1732  [place] 4*     Microbiology: Recent Results (from the past 720 hour(s))  Blood culture (routine x 2)     Status: None   Collection Time: 02/18/15  2:41 AM  Result Value Ref Range Status   Specimen Description BLOOD BLOOD RIGHT FOREARM  Final   Special Requests BOTTLES DRAWN AEROBIC AND ANAEROBIC 5CC  Final   Culture   Final    NO GROWTH 5 DAYS Performed at [facility]    Report Status 02/23/2015 FINAL  Final  Urine culture     Status: None   Collection Time: 02/18/15  2:43 AM   Result Value Ref Range Status   Specimen Description URINE, CLEAN CATCH  Final   Special Requests NONE  Final   Culture   Final    NO GROWTH 1 DAY Performed at [facility]    Report Status 02/19/2015 FINAL  Final  Blood culture (routine x 2)     Status: None   Collection Time: 02/18/15  2:46 AM  Result Value Ref Range Status   Specimen Description BLOOD RIGHT ANTECUBITAL  Final   Special Requests BOTTLES DRAWN AEROBIC AND ANAEROBIC 5CC  Final   Culture   Final    NO GROWTH 5 DAYS Performed at [facility]    Report Status 02/23/2015 FINAL  Final  MRSA PCR Screening     Status: None   Collection Time: 02/19/15  8:16 PM  Result Value Ref Range Status   MRSA by PCR NEGATIVE NEGATIVE Final    Comment:        The GeneXpert MRSA Assay (FDA approved for NASAL specimens only), is one component of a comprehensive MRSA colonization surveillance program. It is not intended to diagnose MRSA infection nor to guide or monitor treatment for MRSA infections.   MRSA PCR Screening     Status: None   Collection Time: 03/05/15  8:42 PM  Result Value Ref Range Status   MRSA by PCR NEGATIVE NEGATIVE Final  Comment:        The GeneXpert MRSA Assay (FDA approved for NASAL specimens only), is one component of a comprehensive MRSA colonization surveillance program. It is not intended to diagnose MRSA infection nor to guide or monitor treatment for MRSA infections.   Culture, blood (routine x 2) Call MD if unable to obtain prior to antibiotics being given     Status: None (Preliminary result)   Collection Time: 03/05/15 10:30 PM  Result Value Ref Range Status   Specimen Description BLOOD RIGHT ARM  Final   Special Requests   Final    BOTTLES DRAWN AEROBIC AND ANAEROBIC 10CC BLUE 8CC RED   Culture NO GROWTH 3 DAYS  Final   Report Status PENDING  Incomplete  Culture, blood (routine x 2) Call MD if unable to obtain prior to antibiotics being given     Status: None  (Preliminary result)   Collection Time: 03/05/15 10:40 PM  Result Value Ref Range Status   Specimen Description BLOOD RIGHT ARM  Final   Special Requests   Final    BOTTLES DRAWN AEROBIC AND ANAEROBIC 10CC BLUE [place] RED   Culture NO GROWTH 3 DAYS  Final   Report Status PENDING  Incomplete  Culture, body fluid-bottle     Status: None (Preliminary result)   Collection Time: 03/07/15  2:16 PM  Result Value Ref Range Status   Specimen Description FLUID LEFT PLEURAL  Final   Special Requests   Final    BOTTLES DRAWN AEROBIC AND ANAEROBIC 10MLS LEFT CHEST   Culture NO GROWTH < 24 HOURS  Final   Report Status PENDING  Incomplete  Gram stain     Status: None (Preliminary result)   Collection Time: 03/07/15  2:16 PM  Result Value Ref Range Status   Specimen Description FLUID LEFT PLEURAL  Final   Special Requests BAA 10MLS LEFT CHEST  Final   Gram Stain RARE POLYMORPHONUCLEAR NO ORGANISMS SEEN   Final   Report Status PENDING  Incomplete    Medical History: Past Medical History  Diagnosis Date  . COPD (chronic obstructive pulmonary disease)   . Hypertension   . PONV (postoperative nausea and vomiting)   . Dysrhythmia     rapid heart rate  . Stroke 2010    mini stroke  . Pneumonia 2009  . Shortness of breath   . GERD (gastroesophageal reflux disease)     long time ago  . Depression   . DEMENTIA     borderline  . Depression 01/02/2012  . Anemia 01/02/2012  . Artery stenosis 10/30/12    Moderate to severe left extracranial vertebral  . Cancer 20years ago+ 1999    lt breast  left  . SCC (squamous cell carcinoma), leg     Right  LE Dr. [place] Crane  . Arthritis     Osteo-  Spine and Knees  . Vitamin B12 deficiency   . Vertigo, benign positional   . Memory loss   . Blepharospasm     Left eye  . Venous stasis ulcers 2012    Left LE due to trauma  . Cellulitis Jan 01, 2012    Left UE  . Carotid artery occlusion     Bruit- RIGHT  . Occlusion and stenosis of carotid artery without  mention of cerebral infarction 11/06/2012  . Persistent headaches 02/26/2014  . Chronic respiratory failure     on NCO2    Assessment: 81 y/o F with MDS from nursing home continuing on vancomycin/aztreonam  D#5 for HCAP. Bilat pleural effusions on CXR - to r/o malignant cause. Mult drug allergies. WBC decreased to 17.2, AFeb. IR - US-guided L thoracentesis 7/23 - exudate on prelim report. Vanc Trough low last night and dose increased.  7/21 blood x 2>>ngtd  7/23 pleural fluid>>ngtd   Vanc 7/21 >>  Aztreonam 7/21 >>  VT 7/24 @1730 : 4 on 750mg  q24h (change to 500mg  q12h)   Goal of Therapy:  Vancomycin trough level 15-20 mcg/ml  Plan:  -Vancomycin 500 mg IV q12h  -Aztreonam 1g IV q8h  -Trend WBC, temp, renal function, f/u cx  -De-escalate abx soon?   Elicia Lamp, PharmD Clinical Pharmacist Pager [phone] 03/09/2015 8:25 AM

## 2015-03-10 DIAGNOSIS — Z7189 Other specified counseling: Secondary | ICD-10-CM

## 2015-03-10 DIAGNOSIS — Z515 Encounter for palliative care: Secondary | ICD-10-CM

## 2015-03-10 LAB — CBC
HCT: 26.5 % — ABNORMAL LOW (ref 36.0–46.0)
Hemoglobin: 8.7 g/dL — ABNORMAL LOW (ref 12.0–15.0)
MCH: 30.5 pg (ref 26.0–34.0)
MCHC: 32.8 g/dL (ref 30.0–36.0)
MCV: 93 fL (ref 78.0–100.0)
Platelets: 27 10*3/uL — CL (ref 150–400)
RBC: 2.85 MIL/uL — ABNORMAL LOW (ref 3.87–5.11)
RDW: 18.2 % — ABNORMAL HIGH (ref 11.5–15.5)
WBC: 14.9 10*3/uL — ABNORMAL HIGH (ref 4.0–10.5)

## 2015-03-10 LAB — CULTURE, BLOOD (ROUTINE X 2)
CULTURE: NO GROWTH
Culture: NO GROWTH

## 2015-03-10 LAB — BASIC METABOLIC PANEL
ANION GAP: 9 (ref 5–15)
BUN: 21 mg/dL — ABNORMAL HIGH (ref 6–20)
CHLORIDE: 96 mmol/L — AB (ref 101–111)
CO2: 30 mmol/L (ref 22–32)
Calcium: 7.9 mg/dL — ABNORMAL LOW (ref 8.9–10.3)
Creatinine, Ser: 0.41 mg/dL — ABNORMAL LOW (ref 0.44–1.00)
GFR calc Af Amer: >60 mL/min (ref >60)
GFR calc non Af Amer: >60 mL/min (ref >60)
Glucose, Bld: 90 mg/dL (ref 65–99)
Potassium: 4.1 mmol/L (ref 3.5–5.1)
Sodium: 135 mmol/L (ref 135–145)

## 2015-03-10 MED ORDER — SENNA 8.6 MG PO TABS: 1.0000 | ORAL_TABLET | Freq: Every evening | ORAL | Status: DC | PRN

## 2015-03-10 MED ORDER — AZITHROMYCIN 250 MG PO TABS
250.0000 mg | ORAL_TABLET | Freq: Every day | ORAL | Status: AC
  Administered 2015-03-10 – 2015-03-11 (×2): 250 mg via ORAL
  Filled 2015-03-10 (×2): qty 1

## 2015-03-10 NOTE — Progress Notes (Signed)
Physical Therapy Treatment Patient Details Name: Tonya Mckee MRN: [id number] DOB: Jun 12, 1929 Today's Date: 03/10/2015    History of Present Illness 81 yo female with HTN, MDS, COPD on 2-4 L O2 via [place] at home, a-fib with SSS, dementia, SNF resident who presented to [facility] ED for evaluation of several days duration of progressive dyspnea. Sepsis secondary to HCAP, Acute on chronic respiratory failure, oxygen dependent at home 2-4 L via [place] secondary to COPD, Bilateral pleural effusions, and Hypokalemia.    PT Comments    Patient was tolerable to transfer from hospital bed to chair with max assist. Based on presentation and complaint of back pain, vestibular testing was deferred as this was not deemed the cause of her symptoms. Patient desatted to 86% SpO2 on 4L after transfer and felt tired per patient report, and was mildly cooperative with BLE exercises in the chair. Patient will benefit from continued PT to address functional impairments listed below, and will further benefit from placement in a SNF for continued therapy and care.   Follow Up Recommendations  SNF;Supervision/Assistance - 24 hour     Equipment Recommendations  None recommended by PT    Recommendations for Other Services OT consult     Precautions / Restrictions Precautions Precautions: Fall Restrictions Weight Bearing Restrictions: No    Mobility  Bed Mobility Overal bed mobility: Needs Assistance Bed Mobility: Supine to Sit     Supine to sit: Max assist     General bed mobility comments: Head of bed elevated, assist with trunk and LE's to get to EOB  Transfers Overall transfer level: Needs assistance Equipment used: 1 person hand held assist Transfers: Sit to/from [employer] Sit to Stand: Max assist;+2 safety/equipment (for recliner stabilization)   Squat pivot transfers: Max assist;+2 safety/equipment     General transfer comment: Support at pelvis with drawpad, therapist mainly  pivoted pt, verbal and manual cues for forward trunk lean  Ambulation/Gait                 Stairs            Wheelchair Mobility    Modified Rankin (Stroke Patients Only)       Balance                                    Cognition Arousal/Alertness: Awake/alert Behavior During Therapy: WFL for tasks assessed/performed Overall Cognitive Status: Impaired/Different from baseline Area of Impairment: Following commands       Following Commands: Follows one step commands inconsistently (hard of hearing vs. cognition vs. motivation?)            Exercises      General Comments        Pertinent Vitals/Pain Pain Assessment: Faces Faces Pain Scale: Hurts whole lot Pain Location: upper back Pain Descriptors / Indicators: Grimacing Pain Intervention(s): Limited activity within patient's tolerance;Monitored during session;Premedicated before session    Home Living                      Prior Function            PT Goals (current goals can now be found in the care plan section) Acute Rehab PT Goals Patient Stated Goal: to get out of hospital PT Goal Formulation: With patient/family    Frequency  Min 2X/week    PT Plan      Co-evaluation  End of Session Equipment Utilized During Treatment: Oxygen Activity Tolerance: Patient limited by fatigue;Patient limited by lethargy;Patient limited by pain (pt c/o sleepiness and back pain during transfer) Patient left: in chair;with call bell/phone within reach;with family/visitor present     Time: 1536-1610 PT Time Calculation (min) (ACUTE ONLY): 34 min  Charges:  $Therapeutic Activity: 8-22 mins                    G CodesRoanna Epley, SPT [phone]  03/10/2015, 5:31 PM

## 2015-03-10 NOTE — Progress Notes (Addendum)
Patient ID: Tonya Mckee, female   DOB: 10/05/1928, 81 y.o.   MRN: [id number]  [facility] PROGRESS NOTE  Tonya Mckee WSF:[id number] DOB: [birth year] DOA: 03/05/2015 PCP: [name]   Brief narrative:    81 yo female with HTN, MDS, COPD on 2-4 L O2 via [place] at home, a-fib with SSS, dementia, SNF resident who presented to [facility] ED for evaluation of several days duration of progressive dyspnea that was noted by nursing staff, with exertion and even at rest. This has been associated with productive cough of clear and yellow sputum, worse with deep inspiration and with no specific alleviating factors. Pt also reports mid area chest discomfort with coughing spells, that occasionally but not consistently radiating to the back area, denies fevers, chills, no recent similar events.   In ED, VS notable for T 99.3 with HR stable and at target range, RR up to 26 bpm, BP stable. CXR notable for bilateral pleural effusion and left sided PNA. WBC 21K. Pt started on vancomycin and zosyn in ED and [facility] asked to admit for further evaluation.   Of note, Tonya Mckee has MDS which has previously been treated with revlimid, however, this was held in the setting of a dental infection apparently.She is currently on a taper of steroids and is supposed to be on 61m per day currently. She has chronically low Hgb/anemia due to her MDS and is to receive 2 units of PRBCs if Hgb drops below 8 (was 7.6 on admission). She also has had persistently low platelets, thought to be due to receiving Abx for a recent dental infection.  Assessment/Plan:    Sepsis secondary to HCAP (healthcare-associated pneumonia) - pt met criteria for sepsis with WBC 21K, RR up to 26 bpm, source PNA  - Pneumonia apparent on CXR, she was recently in the hospital and lives in a NH, ABX broadened to cover for HCAP  - continue vanc and aztreonam day #6/7, transition to Zithromax for one more day and stop after tomorrow's dose  - Urinary  antigens for legionella and strep negative to date  - Sputum culture also requested and negative to date   - Blood culture X 2 obtained, follow up - continue to keep on oxygen via [place] to keep O2 sat > 92%  Acute on chronic respiratory failure, oxygen dependent at home 2-4 L via [place] secondary to COPD - now with HCAP and bilateral pleural effusions, acute on chronic diastolic CHF  - ABX as noted above and keep on oxygen via [place] - BD as needed  - IR consulted for thoracentesis (left side first), now status post left thoracentesis 7/23, prelim report exudate and cytology with inflammatory cells and no signs of malignant cells  - continue Lasix   Bilateral pleural effusions - appears to be secondary to acute on chronic diastolic CHF but given underlying MDS, need to r/o malignant cause - IR assistance appreciated for doing thoracentesis on the left side, 750 cc fluid removed, exudate by analysis, final report pending but preliminary cytology with no malignant cells  - placed on Lasix 20 mg IV BID, will continue same regimen  - ECHO notable for grade II diastolic CHF  - weight trend since admission: 112 lbs --> 121 lbs --> 112 --> 110 lbs this AM - monitor daily weights, strict I/O - repeat CXR as needed to follow up   Hypokalemia - supplemented and WNL  Back pain - unclear if related to acute illness vs MDS - no focal  neurological findings on exam  - provide analgesia as needed for now - try to get OOB to chair  - family in agreement with SNF but wants to discuss other facilities  Chest pain - Pleuritic in nature and very hard for her to localize - secondary to pleural effusions, HCAP, demand ischemia - troponin initially elevated due to demand ischemia  - no chest pain this AM, trop trending down and WNL x 3 sets  - CT chest angio negative for PE but notable for bilateral pleural effusions worse on the left side  MDS (myelodysplastic syndrome) with 5q deletion, anemia with Hgb < 8 -  She is on chronic steroids at home, presumably she received this the day of the admission. - pt has been transfused two U PRBC on admission with Hg up from 7.6 --> 10.8 --> 10.3 this aM - repeat CBC in AM - Tonya Mckee notified via EPIC order   Thrombocytopenia - per oncology's last note, low platelets thought to be due to recent Abx for dental infection vs MDS itself  - Plt drop from 33 --> 26 --> 28 --> 23 --> 21 --> 27 K this AM - repeat CBC in AM and transfuse if Plt < 10 K or bleeding  - family wants to discuss with Tonya Mckee, will call office today   Essential hypertension - Continue metoprolol for Afib - continue Lasix 20 mg IV BID    COPD GOLD II - Minimal wheezing on exam, so pneumonia and pleural effusions likely cause of SOB - Continue spiriva - Duonebs as needed   Persistent headaches - Chronic, monitor - Per patient's daughter, narcotics should be avoided b/c of over sedation (not surprising given cachexia)   Hyponatremia - Possibly related to acute lung disorder ongoing - resolved    Inadequate oral intake in the setting of acute illness  - Ensure TID - nutritionist consulted    Atrial fibrillation with SSS, CHADS 2 = 3-4  - Continue metoprolol as needed for now  - Continue digoxin - not AC candidate due to low plt and propensity for bleed - daughter in agreement to avoid [facility]   DVT prophylaxis - SCD's  Code Status: Partial - no intubation and no CPR but OK to use pressors if needed  Family Communication:  plan of care discussed with the patient and daughter at bedside at length, discussed current studies and results, high risk for decompensations given complexity of medical conditions outlined above. Recommended palliative care consult and discussion with family about DNR code status.  Disposition Plan: not ready for d/c   IV access:  Peripheral IV  Procedures and diagnostic studies:    Dg Chest 2 View 03/05/2015  Bilateral pleural effusions,  left greater right right with left lower lobe atelectasis/pneumonia.   Medical Consultants:  IR for thoracentesis  PCT  Other Consultants:  Nutritionist   IAnti-Infectives:   Vancomycin 7/21 --> 7/26 Aztreonam 7/21 --> 7/26 Zithromax 7/26 --> 7/27   Faye Ramsay, MD  [facility] Pager [phone]  If 7PM-7AM, please contact night-coverage [email or web address] Password [email or web address] 03/10/2015, 4:12 PM   LOS: 5 days   HPI/Subjective: No events overnight. Still with headache and back pain.   Objective: Filed Vitals:   03/09/15 1928 03/09/15 2118 03/10/15 0436 03/10/15 1000  BP:  156/70 153/66 161/66  Pulse: 73 69 73 78  Temp:  98.2 F (36.8 C) 97.6 F (36.4 C) 98.8 F (37.1 C)  TempSrc:  Oral Oral Oral  Resp: 16 24 20  20  Height:      Weight:  49.986 kg (110 lb 3.2 oz)    SpO2: 93% 100% 100% 100%    Intake/Output Summary (Last 24 hours) at 03/10/15 1612 Last data filed at 03/10/15 0900  Gross per 24 hour  Intake    180 ml  Output   2650 ml  Net  -2470 ml    Exam:   General:  Pt is alert, follows commands appropriately, frail and weak   Cardiovascular: Irregular rate and rhythm, no rubs, no gallops, SEM 2/6  Respiratory: scattered rhonchi, diminished breath sounds at bases   Abdomen: Soft, non tender, non distended, bowel sounds present, no guarding  Extremities:pulses DP and PT palpable bilaterally  Data Reviewed: Basic Metabolic Panel:  Recent Labs Lab 03/06/15 1336 03/07/15 0535 03/08/15 0529 03/09/15 0608 03/10/15 0458  NA 133* 133* 133* 132* 135  K 3.6 3.8 3.3* 4.1 4.1  CL 98* 96* 95* 97* 96*  CO2 _0 GLUCOSE 128* 104* 147* 87 90  BUN 16 15 22* 21* 21*  CREATININE 0.64 0.52 0.58 0.55 0.41*  CALCIUM 7.8* 8.1* 8.0* 7.9* 7.9*   Liver Function Tests:  Recent Labs Lab 03/06/15 1336  AST 24  ALT 17  ALKPHOS 61  BILITOT 2.4*  PROT 6.0*  ALBUMIN 2.1*   CBC:  Recent Labs Lab 03/05/15 2240 03/06/15 1336 03/07/15 0535 03/08/15 0529  03/09/15 0608 03/10/15 0458  WBC  --  18.5* 22.9* 21.1* 17.2* 14.9*  NEUTROABS 6.1 12.0*  --   --   --   --   HGB  --  10.8* 10.3* 10.0* 9.7* 8.7*  HCT  --  31.6* 30.8* 29.8* 28.9* 26.5*  MCV  --  88.5 88.8 90.6 90.0 93.0  PLT  --  26* 28* 23* 21* 27*   Cardiac Enzymes:  Recent Labs Lab 03/05/15 2240 03/06/15 1336 03/06/15 1933 03/06/15 2344 03/07/15 0535  TROPONINI 0.05* 0.13* <0.03 <0.03 0.03     Recent Results (from the past 240 hour(s))  MRSA PCR Screening     Status: None   Collection Time: 03/05/15  8:42 PM  Result Value Ref Range Status   MRSA by PCR NEGATIVE NEGATIVE Final  Culture, blood (routine x 2) Call MD if unable to obtain prior to antibiotics being given     Status: None (Preliminary result)   Collection Time: 03/05/15 10:30 PM  Result Value Ref Range Status   Specimen Description BLOOD RIGHT ARM  Final   Special Requests   Final    BOTTLES DRAWN AEROBIC AND ANAEROBIC 10CC BLUE 8CC RED   Culture NO GROWTH < 24 HOURS  Final   Report Status PENDING  Incomplete  Culture, blood (routine x 2) Call MD if unable to obtain prior to antibiotics being given     Status: None (Preliminary result)   Collection Time: 03/05/15 10:40 PM  Result Value Ref Range Status   Specimen Description BLOOD RIGHT ARM  Final   Special Requests   Final    BOTTLES DRAWN AEROBIC AND ANAEROBIC 10CC BLUE [place] RED   Culture NO GROWTH < 24 HOURS  Final   Report Status PENDING  Incomplete     Scheduled Meds: . aztreonam  1 g Intravenous 3 times per day  . digoxin  0.125 mg Oral Daily  . famotidine  10 mg Oral BID  . FLUoxetine  30 mg Oral q morning - 10a  . furosemide  20 mg Intravenous BID  .  levalbuterol  1.25 mg Nebulization BID  . metoprolol succinate  50 mg Oral QPM  . polyethylene glycol  17 g Oral Daily  . predniSONE  10 mg Oral Q breakfast  . tiotropium  18 mcg Inhalation Daily  . vancomycin  500 mg Intravenous Q12H   Continuous Infusions:

## 2015-03-11 DIAGNOSIS — J918 Pleural effusion in other conditions classified elsewhere: Secondary | ICD-10-CM

## 2015-03-11 DIAGNOSIS — D630 Anemia in neoplastic disease: Secondary | ICD-10-CM

## 2015-03-11 DIAGNOSIS — R51 Headache: Secondary | ICD-10-CM

## 2015-03-11 DIAGNOSIS — J189 Pneumonia, unspecified organism: Secondary | ICD-10-CM

## 2015-03-11 DIAGNOSIS — I10 Essential (primary) hypertension: Secondary | ICD-10-CM

## 2015-03-11 DIAGNOSIS — I480 Paroxysmal atrial fibrillation: Secondary | ICD-10-CM

## 2015-03-11 DIAGNOSIS — E871 Hypo-osmolality and hyponatremia: Secondary | ICD-10-CM

## 2015-03-11 DIAGNOSIS — E46 Unspecified protein-calorie malnutrition: Secondary | ICD-10-CM

## 2015-03-11 DIAGNOSIS — D693 Immune thrombocytopenic purpura: Secondary | ICD-10-CM

## 2015-03-11 DIAGNOSIS — D46C Myelodysplastic syndrome with isolated del(5q) chromosomal abnormality: Secondary | ICD-10-CM

## 2015-03-11 MED ORDER — MORPHINE SULFATE 2 MG/ML IJ SOLN
1.0000 mg | INTRAMUSCULAR | Status: DC | PRN
  Administered 2015-03-11 – 2015-03-12 (×3): 1 mg via INTRAVENOUS
  Filled 2015-03-11 (×3): qty 1

## 2015-03-11 NOTE — Progress Notes (Signed)
Daily Progress Note   Patient Name: Tonya Mckee       Date: 03/11/2015 DOB: March 23, 1929  Age: 81 y.o. MRN#: [id number] Attending Physician: Cristal Ford, DO Primary Care Physician: [name] Admit Date: 03/05/2015  Reason for Consultation/Follow-up: Establishing goals of care, Non pain symptom management, Pain control and Psychosocial/spiritual support  Subjective:      -continued conversation with daughter at bedside/Karen to discuss diagnosis, prognosis, GOC, EOL wishes disposition and options.  -attempted to call daughter [name] but unable to contact or leave message, phone is full    The difference between a aggressive medical intervention path  and a palliative comfort care path for this patient at this time was had.  Values and goals of care important to patient and family were attempted to be elicited.  Concept of Hospice and Palliative Care were discussed.  Natural trajectory and expectations at EOL were discussed.  Questions and concerns addressed.  PMT will continue to support holistically.   Length of Stay: 6 days  Current Medications: Scheduled Meds:  . digoxin  0.125 mg Oral Daily  . famotidine  10 mg Oral BID  . FLUoxetine  30 mg Oral q morning - 10a  . furosemide  20 mg Intravenous BID  . levalbuterol  1.25 mg Nebulization BID  . metoprolol succinate  50 mg Oral QPM  . polyethylene glycol  17 g Oral Daily  . predniSONE  10 mg Oral Q breakfast  . tiotropium  18 mcg Inhalation Daily    Continuous Infusions:    PRN Meds: acetaminophen, ketorolac, levalbuterol, menthol-cetylpyridinium, metoprolol, morphine injection, senna, sorbitol  Palliative Performance Scale: 30% at best     Vital Signs: BP 132/66 mmHg  Pulse 76  Temp(Src) 98.2 F (36.8 C) (Oral)  Resp 18  Ht 5\' 1"  (1.549 m)  Wt 49.986 kg (110 lb 3.2 oz)  BMI 20.83 kg/m2  SpO2 98% SpO2: SpO2: 98 % O2 Device: O2 Device: Nasal Cannula O2 Flow Rate: O2 Flow Rate (L/min): 4  L/min  Intake/output summary:  Intake/Output Summary (Last 24 hours) at 03/11/15 1523 Last data filed at 03/11/15 1300  Gross per 24 hour  Intake    170 ml  Output   2150 ml  Net  -1980 ml   LBM: Last BM Date: 03/09/15 Baseline Weight: Weight: 50.9 kg (112 lb 3.4 oz) Most recent weight: Weight: 49.986 kg (110 lb 3.2 oz)  Physical Exam:              General: ill appearing, frail HEENT: moist buccal membranes, no exudate CVS: RRR Resp: decreased in bases    Additional Data Reviewed: Recent Labs     03/09/15  0608  03/10/15  0458  WBC  17.2*  14.9*  HGB  9.7*  8.7*  PLT  21*  27*  NA  132*  135  BUN  21*  21*  CREATININE  0.55  0.41*     Problem List:  Patient Active Problem List   Diagnosis Date Noted  . Palliative care encounter 03/10/2015  . DNR (do not resuscitate) discussion 03/10/2015  . HCAP (healthcare-associated pneumonia) 03/05/2015  . Atrial fibrillation 02/20/2015  . SSS (sick sinus syndrome) 02/20/2015  . Tachycardia-bradycardia   . Fever 02/18/2015  . General weakness 02/18/2015  . COPD exacerbation 02/18/2015  . Blurred vision, bilateral 01/30/2015  . Protein calorie malnutrition 12/12/2014  . Poor dentition 12/12/2014  . Orthostatic hypotension 10/14/2014  . Weakness 10/14/2014  . Dehydration 10/14/2014  . Fall  09/15/2014  . Fracture of lumbar spine L 1-3 transverse process 09/15/2014  . Lumbar transverse process fracture   . Other constipation 09/02/2014  . Thrombocytopenia due to drugs 08/14/2014  . Acute gastroenteritis 07/30/2014  . Elevated transaminase level 07/30/2014  . Hyponatremia 07/30/2014  . Hypokalemia 07/30/2014  . Abnormal LFTs (liver function tests)   . Chronic respiratory failure   . Hypotension 07/14/2014  . Anemia in neoplastic disease 04/24/2014  . Persistent headaches 02/26/2014  . Tobacco abuse 01/30/2014  . Occlusion and stenosis of carotid artery without mention of cerebral infarction 11/06/2012  . Dizziness  and giddiness 11/06/2012  . Essential hypertension 01/02/2012  . Depression 01/02/2012  . COPD GOLD II 01/02/2012  . MDS (myelodysplastic syndrome) with 5q deletion 01/02/2012     Palliative Care Assessment & Plan    Code Status:  DNR-documented today, discussed with family at bedsdie  Goals of Care:   Shifting toward comfort, will confirm with daughter/main decision maker and continue to make changes to care plan   3. Symptom Management:  Pain/Dyspnea: morphine 1 mg IV every 3 hrs prn  5. Prognosis: Dependant on decisions, likely 6 months or less  5. Discharge Planning: Pending   Care plan was discussed with Dr Ree Kida  Thank you for allowing the Palliative Medicine Team to assist in the care of this patient.   Time In:  0800 Time Out: 0835 Total Time 35 min Prolonged Time Billed  no     Greater than 50%  of this time was spent counseling and coordinating care related to the above assessment and plan.     Knox Royalty, NP  03/11/2015, 3:23 PM  Please contact Palliative Medicine Team phone at [phone] for questions and concerns.

## 2015-03-11 NOTE — Progress Notes (Signed)
[place]  [phone]) [phone]   Patient Care Team: Tonya Levering, PA-C as PCP - General (Family Medicine) Lennon Alstrom, MD (Neurology) Tonya Levering, PA-C as Referring Physician (Family Medicine) Heath Lark, MD as Consulting Physician (Hematology and Oncology)  HOSPITAL CONSULT  NOTE I have seen the patient, examined her and edited the notes as follows  HPI:  Tonya Mckee is a 81 year old woman with a history of  MDS (myelodysplastic syndrome) with 5q deletion, with profound bone marrow suppression and pancytopenia, admitted via Emergency Department with date he study of progressive dyspnea and clear productive cough.  She had pleuritic chest pain as well as back pain.  Her oral intake was decreased.  No confusion was reported.  On admission, she was febrile.  A chest x-ray showed bilateral pleural effusion, with left-sided pneumonia.    CT angio of the chest on 7/22 showed no evidence of central pulmonary embolism and a large left pleural effusion and moderate sized right pleural effusion.  She had a left thoracentesis on 7/23 with some improvement on her respiratory status. Cytology was negative for malignancy, felt to be secondary to CHF. She was started on IV antibiotics with vancomycin and Zosyn after cultures were drawn. Cultures to date are negative. She is very weak and sleepy. Daughter is at bedside, who reports that the patient has clinically declined. The patient is currently on limited code, DNI. Palliative care has consulted on patient on 7/25. Patient's daughter wishes to discuss prognosis, possibility of discharging patient to home with Hospice.  We have been notified of the patient's admission. She complained of back pain, relieved with pain medicine. She bruises easily. The patient denies any recent signs or symptoms of bleeding such as spontaneous epistaxis, hematuria or hematochezia.  MEDICATIONS: Scheduled Meds: . azithromycin  250 mg Oral Daily    . digoxin  0.125 mg Oral Daily  . famotidine  10 mg Oral BID  . FLUoxetine  30 mg Oral q morning - 10a  . furosemide  20 mg Intravenous BID  . levalbuterol  1.25 mg Nebulization BID  . metoprolol succinate  50 mg Oral QPM  . polyethylene glycol  17 g Oral Daily  . predniSONE  10 mg Oral Q breakfast  . tiotropium  18 mcg Inhalation Daily   PRN Meds:.acetaminophen, ketorolac, levalbuterol, menthol-cetylpyridinium, metoprolol, morphine injection, senna, sorbitol ALLERGIES:   Allergies  Allergen Reactions  . Donepezil Nausea And Vomiting  . Penicillins Itching and Nausea And Vomiting  . Amoxapine And Related Nausea Only  . Doxycycline Rash  . Keflex [Cephalexin] Itching  . Bupropion Other (See Comments)    "Headaches on the XL."   . Cardizem [Diltiazem Hcl] Other (See Comments)    Bradycardia/presyncope  . Ketek [Telithromycin] Itching    Dry Mouth  . Levaquin [Levofloxacin In D5w] Other (See Comments)    Insomnia  . Sulfa Antibiotics Nausea And Vomiting  . Tramadol Other (See Comments)    "Made her feel like a zombie."  . Zyrtec [Cetirizine] Other (See Comments)    "made her like a zombie"     PHYSICAL EXAMINATION:  Filed Vitals:   03/11/15 0440  BP: 142/65  Pulse: 79  Temp: 97.9 F (36.6 C)  Resp: 18   Filed Weights   03/08/15 0421 03/08/15 2129 03/09/15 2118  Weight: 109 lb 9.1 oz (49.7 kg) 110 lb 10.7 oz (50.2 kg) 110 lb 3.2 oz (49.986 kg)    GENERAL: The patient frail, pale and  appears comfortable  SKIN: Multiple areas of ecchymosis, no petechia or rash.  Skin is dry . EYES: normal, conjunctiva are pale and non-injected, sclera clear OROPHARYNX:no exudate, no erythema and lips, buccal mucosa, and tongue normal  NECK: supple, thyroid normal size, non-tender, without nodularity LYMPH:  no palpable lymphadenopathy in the cervical, axillary or inguinal LUNGS: Decreased breath sounds at the bases, with scattered rhonchi, no wheezing. Normal breathing  effort HEART: regular rate & rhythm and 1/6 systolic murmurs and no lower extremity edema ABDOMEN: soft, non-tender and normal bowel sounds Musculoskeletal:no cyanosis of digits and no clubbing  PSYCH: Alert and oriented NEURO: no apparent focal motor/sensory deficits   LABORATORY/RADIOLOGY DATA:   Recent Labs Lab 03/05/15 2240 03/06/15 1336 03/07/15 0535 03/08/15 0529 03/09/15 0608 03/10/15 0458  WBC  --  18.5* 22.9* 21.1* 17.2* 14.9*  HGB  --  10.8* 10.3* 10.0* 9.7* 8.7*  HCT  --  31.6* 30.8* 29.8* 28.9* 26.5*  PLT  --  26* 28* 23* 21* 27*  MCV  --  88.5 88.8 90.6 90.0 93.0  MCH  --  30.3 29.7 30.4 30.2 30.5  MCHC  --  34.2 33.4 33.6 33.6 32.8  RDW  --  17.3* 17.8* 18.1* 17.9* 18.2*  LYMPHSABS 3.1 1.7  --   --   --   --   MONOABS 9.1* 4.8*  --   --   --   --   EOSABS 0.0 0.0  --   --   --   --   BASOSABS 0.0 0.0  --   --   --   --     CMP    Recent Labs Lab 03/06/15 1336 03/07/15 0535 03/08/15 0529 03/09/15 0608 03/10/15 0458  NA 133* 133* 133* 132* 135  K 3.6 3.8 3.3* 4.1 4.1  CL 98* 96* 95* 97* 96*  CO2 29 27 28 28 30   GLUCOSE 128* 104* 147* 87 90  BUN 16 15 22* 21* 21*  CREATININE 0.64 0.52 0.58 0.55 0.41*  CALCIUM 7.8* 8.1* 8.0* 7.9* 7.9*  AST 24  --   --   --   --   ALT 17  --   --   --   --   ALKPHOS 61  --   --   --   --   BILITOT 2.4*  --   --   --   --         Component Value Date/Time   BILITOT 2.4* 03/06/2015 1336   BILITOT 0.67 02/12/2015 1141   BILIDIR 0.6* 08/01/2014 0550   IBILI 0.6 08/01/2014 0550     Liver Function Tests:  Recent Labs Lab 03/06/15 1336  AST 24  ALT 17  ALKPHOS 61  BILITOT 2.4*  PROT 6.0*  ALBUMIN 2.1*    Radiology Studies:   Dg Chest 2 View  03/08/2015     COMPARISON:  March 07, 2015  FINDINGS: There are persistent small pleural effusions, slightly larger on the left than on the right. There is mild interstitial edema superimposed on more chronic scarring. Heart is enlarged. There is slight  pulmonary venous hypertension. There is postoperative change on the left with surgical clips present. No adenopathy appreciable.  IMPRESSION: Findings felt to represent a degree of congestive heart failure superimposed on chronic underlying interstitial disease. No appreciable airspace consolidation. No adenopathy.   Electronically Signed   By: Lowella Grip III M.D.   On: 03/08/2015 19:09    Ct Angio Chest Pe W/cm &/  or Wo Cm  03/06/2015   COMPARISON:  CT chest 12/19/2013.  FINDINGS: Contrast opacification of the pulmonary arteries is fair at best. Respiratory motion blurred images of the lung bases. Overall, the study is less than optimal.  No filling defects within either main pulmonary artery or the air central branches in either lung. Heart enlarged with evidence of left ventricular hypertrophy. Severe 3 vessel coronary atherosclerosis. Prominent epicardial fat. No pericardial effusion. Moderate atherosclerosis involving the thoracic and upper abdominal aorta without aneurysm or dissection.  Emphysematous changes throughout both lungs. Mild diffuse interstitial pulmonary edema. Large left pleural effusion and moderate sized right pleural effusion. Associated dense passive atelectasis in the lower lobes, left greater than right. Fluid in the major fissure on the right. Central airways patent with no significant bronchial wall thickening.  No significant mediastinal, hilar or axillary lymphadenopathy. Visualized thyroid gland unremarkable.  Visualized upper abdomen unremarkable for the early arterial phase of enhancement. Bone window images demonstrate mid and lower thoracic spondylosis, mild osseous demineralization and exaggeration of the usual thoracic kyphosis.  Review of the MIP images confirms the above findings.  IMPRESSION: 1. No evidence of central pulmonary embolism. 2. Mild CHF with cardiomegaly and mild diffuse interstitial pulmonary edema. 3. Large left pleural effusion and moderate sized  right pleural effusion. Associated dense passive atelectasis in the lower lobes, left greater than right.   Electronically Signed   By: Evangeline Dakin M.D.   On: 03/06/2015 22:57   US Thoracentesis Asp Pleural Space W/img Guide  03/07/2015    IMPRESSION: Successful ultrasound-guided left sided thoracentesis yielding 750 ml of pleural fluid.  Read By:  [name] PA-C   Electronically Signed   By: Aletta Edouard M.D.   On: 03/07/2015 14:05   ASSESSMENT AND PLAN:  MDS (myelodysplastic syndrome) with 5q deletion She has profound bone marrow suppression and pancytopenia due to recent dental infection. Revlimid is on hold. She is on prednisone as count have not improved She was on transfusion support with close monitoring of her counts. However, with a general decline, I told the patient and family that I will no longer be prescribing Revlimid or transfusion support as she is not getting better. Ultimately, we agreed with the route for palliative care and hospice  Anemia in neoplastic disease This is likely anemia of chronic disease and related to her bone marrow disorder.  No bleeding issues are reported She received 2 units on admission for a Hb 7.6 At this point in time, I recommend transitioning her care to comfort care and stop all blood draws.  Thrombocytopenia due to drugs This is likely due to recent treatment, chronic disease, acute illness No bleeding issues reported  She is asymptomatic from the low platelet count. Will observe for now.  As above, I recommend we stop blood draws and transfusion support  Tachycardia-bradycardia She has extensive evaluation while hospitalized. She is not a candidate for anticoagulation therapy due to chronic thrombocytopenia. Currently she is on normal sinus rhythm and she is not symptomatic Continue medical management.  Sepsis due to health care associated pneumonia Chest x ray showed bilateral pleural effusion, with left-sided  pneumonia. Cultures negative to date She was placed on IV broad spectrum antibiotics  I recommend transitioning anti-biotics to oral pills. If the patient has difficulty swallowing, I recommend transitioning her care to comfort care  Bilateral pleural effusion Likely due to CHF. Chest x ray and CT chest showed bilateral pleural effusion, with left-sided pneumonia.  She underwent left thoracentesis  yielding  750 ml of pleural fluid, with better control of her respiratory status. Preliminary cytology is negative for malignant cells. 2 D echo shows grade 2 CHF Continue Lasix and other supportive therapy as needed.  Back pain Controlled with present analgesic therapy  Leukocytosis This is due to Prednisone and possible infection  Cultures negative to date. She is afebrile.  No intervention is indicated at this time  DVT prophylaxis On mechanical devices due to low platelet count.   Code Status Limited. DNI Recommend Palliative Care follow up Patient's daughter is interested in Home with Hospice.  I estimated her prognosis is poor. Without further transfusion support, I estimated longevity to be less in 2 weeks. I recommend transferring her to [facility] place if possible. I will sign off. I addressed all questions and concerns  Other medical issues as per admitting team   [facility] E, PA-C 03/11/2015, 8:30 AM Recardo Linn, MD 03/11/2015

## 2015-03-11 NOTE — Progress Notes (Signed)
[facility]                                                                              Patient Demographics  Tonya Mckee, is a 81 y.o. female, DOB - [birth year], UUV:[id number]  Admit date - 03/05/2015   Admitting Physician Sid Falcon, MD  Outpatient Primary MD for the patient is [name]  LOS - 6   Chief Complaint  Patient presents with  . Chest Pain      HPI on 03/05/2015 by Dr. Gilles Chiquito Tonya Mckee is an 81yo woman with PMH of MDS, HTN, COPD on home O2 (2-4L), Afib with SSS, malnutrition, GERD, Dementia, chronic headaches, arthritis, nursing home resident who presents for chest pain and SOB for a few days. Tonya Mckee reports that for the last few days she has noticed frontal chest pain that is worse with deep inspiration. She further notes back pain. The chest pain is difficult for her to describe but is anterior, substernal and on both sides of her sternum. She also notes SOB and reports a need to use more oxygen (however, it is not clear how much oxygen she was using at the nursing home, she is on 4LNC here in the ED). She denies fever, chills, hemoptysis or any other symptoms at this time. She was recently in the hospital this month for a COPD exacerbation. On CXR in the ED, she was found to have bilateral pleural effusions and a left sided pneumonia. Her WBC is chronically elevated, but is doubled today to 24. Differential not done in the ED and is pending. She was given vancomycin and zosyn in the ED.  Of note, Tonya Mckee has MDS which has previously been treated with revlimid, however, this was held in the setting of a dental infection apparently. She is currently on a taper of steroids and is supposed to be on 10m per day currently. She has chronically low Hgb/anemia due to her MDS and is to receive 2 units of PRBCs if Hgb drops below 8 (today it is 7.6). She also has had persistently low platelets, thought to be due to receiving Abx for a  dental infection. Today they are 322 She has significant bruising and petechiae on chest. She reports these are chronic due to her MDS.  NH documentation lists her as Full code and I confirmed this with daughter on the phone  Assessment & Plan   Sepsis secondary to HCAP (healthcare-associated pneumonia) - Upon admission, pt met criteria for sepsis with WBC 21K, RR up to 26 bpm, source PNA  - Pneumonia apparent on CXR, she was recently in the hospital and lives in a NH, ABX broadened to cover for HCAP  - continue vanc and aztreonam day #6/7, transition to Zithromax for one more day (due to multiple allergies) - Urinary antigens for legionella and strep negative to date  - Sputum culture also requested and negative to date  - Blood culture X 2 no growth to date - continue to keep on oxygen via [employer] to keep O2 sat > 92%  Acute on chronic respiratory failure, oxygen dependent at home 2-4 L via [place] secondary to COPD - now with HCAP  and bilateral pleural effusions, acute on chronic diastolic CHF  - ABX as noted above and keep on oxygen via [place] - BD as needed  - IR consulted for thoracentesis (left side first), now status post left thoracentesis 7/23, prelim report exudate and cytology with inflammatory cells and no signs of malignant cells, no growth to date  - continue Lasix   Bilateral pleural effusions - appears to be secondary to acute on chronic diastolic CHF but given underlying MDS, need to r/o malignant cause - IR assistance appreciated for doing thojavascript:;racentesis on the left side, 750 cc fluid removed, exudate by analysis, final report pending but preliminary cytology with no malignant cells  - placed on Lasix 20 mg IV BID, will continue same regimen  - ECHO notable for grade II diastolic CHF  - weight trend since admission: 112 lbs --> 121 lbs --> 112 --> 110 lbs this AM - monitor daily weights, strict I/O - repeat CXR as needed to follow up   Hypokalemia -  resolved   Back pain - unclear if related to acute illness vs MDS - no focal neurological findings on exam  - provide analgesia as needed for now - try to get OOB to chair  - family in agreement with SNF but wants to discuss other facilities  Chest pain - Pleuritic in nature and very hard for her to localize - secondary to pleural effusions, HCAP, demand ischemia - troponin initially elevated due to demand ischemia  - no chest pain this AM, trop trending down and WNL x 3 sets  - CT chest angio negative for PE but notable for bilateral pleural effusions worse on the left side  MDS (myelodysplastic syndrome) with 5q deletion, anemia with Hgb < 8 - She is on chronic steroids at home, presumably she received this the day of the admission. - pt has been transfused two U PRBC on admission - last Hb 8.7 - repeat CBC in AM - Dr. Alvy Bimler consulted and appreciated - Spoke to Dr. Alvy Bimler about palliative care, she will also address this afternoon  Thrombocytopenia - per oncology's last note, low platelets thought to be due to recent Abx for dental infection vs MDS itself  - Plt  27, continue to monitor CBC - transfuse if Plt < 10 K or bleeding   Essential hypertension - Continue metoprolol for Afib - continue Lasix 20 mg IV BID    COPD GOLD II - Minimal wheezing on exam, so pneumonia and pleural effusions likely cause of SOB - Continue spiriva - Duonebs as needed   Persistent headaches - Chronic, monitor - Per patient's daughter, narcotics should be avoided b/c of over sedation (not surprising given cachexia)   Hyponatremia - Possibly related to acute lung disorder ongoing - resolved    Inadequate oral intake in the setting of acute illness  - Ensure TID - nutritionist consulted    Atrial fibrillation with SSS, CHADS 2 = 3-4  - Continue metoprolol as needed for now  - Continue digoxin - not AC candidate due to low plt and propensity for bleed - daughter  in agreement to avoid [facility]   Goals of care - Palliative care consultation appreciated - Spoke with daughter at bedside this morning regarding possible rehabilitation versus hospice - Dr. Alvy Bimler to also address this with patient's family  Code Status: DNR  Family Communication: Daughter at bedside  Disposition Plan: Admitted.  Pending recommendations from palliative care and oncology.   Time Spent in minutes   30  minutes  Procedures  left thoracentesis  Consults   Palliative care Oncology Interventional radiology  DVT Prophylaxis  SCDs  Lab Results  Component Value Date   PLT 27* 03/10/2015    Medications  Scheduled Meds: . digoxin  0.125 mg Oral Daily  . famotidine  10 mg Oral BID  . FLUoxetine  30 mg Oral q morning - 10a  . furosemide  20 mg Intravenous BID  . levalbuterol  1.25 mg Nebulization BID  . metoprolol succinate  50 mg Oral QPM  . polyethylene glycol  17 g Oral Daily  . predniSONE  10 mg Oral Q breakfast  . tiotropium  18 mcg Inhalation Daily   Continuous Infusions:  PRN Meds:.acetaminophen, ketorolac, levalbuterol, menthol-cetylpyridinium, metoprolol, morphine injection, senna, sorbitol  Antibiotics    Anti-infectives    Start     Dose/Rate Route Frequency Ordered Stop   03/10/15 1700  azithromycin (ZITHROMAX) tablet 250 mg     250 mg Oral Daily 03/10/15 1628 03/11/15 1037   03/09/15 0600  vancomycin (VANCOCIN) 500 mg in sodium chloride 0.9 % 100 mL IVPB  Status:  Discontinued     500 mg 100 mL/hr over 60 Minutes Intravenous Every 12 hours 03/08/15 1853 03/10/15 1627   03/06/15 1800  vancomycin (VANCOCIN) IVPB 750 mg/150 ml premix  Status:  Discontinued     750 mg 150 mL/hr over 60 Minutes Intravenous Every 24 hours 03/05/15 2322 03/08/15 1853   03/05/15 2330  aztreonam (AZACTAM) 1 g in dextrose 5 % 50 mL IVPB  Status:  Discontinued     1 g 100 mL/hr over 30 Minutes Intravenous 3 times per day 03/05/15 2322 03/10/15 1627   03/05/15 1830   vancomycin (VANCOCIN) IVPB 1000 mg/200 mL premix     1,000 mg 200 mL/hr over 60 Minutes Intravenous  Once 03/05/15 1815 03/05/15 1933      Subjective:   Tonya Mckee seen and examined today.  Patient complains of feeling weak and tired. Denies any chest pain at this time. Still complains of back pain and headache.  Objective:   Filed Vitals:   03/10/15 1918 03/10/15 2257 03/11/15 0440 03/11/15 0938  BP:  129/67 142/65 132/66  Pulse: 70 76 79 76  Temp:  98.2 F (36.8 C) 97.9 F (36.6 C) 98.2 F (36.8 C)  TempSrc:  Oral Oral Oral  Resp: '18 16 18 18  ' Height:      Weight:      SpO2: 94% 95% 99% 98%    Wt Readings from Last 3 Encounters:  03/09/15 49.986 kg (110 lb 3.2 oz)  03/02/15 51.891 kg (114 lb 6.4 oz)  02/24/15 56.246 kg (124 lb)     Intake/Output Summary (Last 24 hours) at 03/11/15 1456 Last data filed at 03/11/15 1300  Gross per 24 hour  Intake    170 ml  Output   2150 ml  Net  -1980 ml    Exam  General: Well developed, malnourished, no distress  HEENT: NCAT, mucous membranes moist.   Cardiovascular: S1 S2 auscultated, irregular, 2/6 SEM  Respiratory: Diminished breath sounds with scattered wheezing  Abdomen: Soft, nontender, nondistended, + bowel sounds  Extremities: warm dry without cyanosis clubbing or edema  Data Review   Micro Results Recent Results (from the past 240 hour(s))  MRSA PCR Screening     Status: None   Collection Time: 03/05/15  8:42 PM  Result Value Ref Range Status   MRSA by PCR NEGATIVE NEGATIVE Final    Comment:  The GeneXpert MRSA Assay (FDA approved for NASAL specimens only), is one component of a comprehensive MRSA colonization surveillance program. It is not intended to diagnose MRSA infection nor to guide or monitor treatment for MRSA infections.   Culture, blood (routine x 2) Call MD if unable to obtain prior to antibiotics being given     Status: None   Collection Time: 03/05/15 10:30 PM  Result Value  Ref Range Status   Specimen Description BLOOD RIGHT ARM  Final   Special Requests   Final    BOTTLES DRAWN AEROBIC AND ANAEROBIC 10CC BLUE 8CC RED   Culture NO GROWTH 5 DAYS  Final   Report Status 03/10/2015 FINAL  Final  Culture, blood (routine x 2) Call MD if unable to obtain prior to antibiotics being given     Status: None   Collection Time: 03/05/15 10:40 PM  Result Value Ref Range Status   Specimen Description BLOOD RIGHT ARM  Final   Special Requests   Final    BOTTLES DRAWN AEROBIC AND ANAEROBIC 10CC BLUE [place] RED   Culture NO GROWTH 5 DAYS  Final   Report Status 03/10/2015 FINAL  Final  Culture, body fluid-bottle     Status: None (Preliminary result)   Collection Time: 03/07/15  2:16 PM  Result Value Ref Range Status   Specimen Description FLUID LEFT PLEURAL  Final   Special Requests   Final    BOTTLES DRAWN AEROBIC AND ANAEROBIC 10MLS LEFT CHEST   Culture NO GROWTH 3 DAYS  Final   Report Status PENDING  Incomplete  Gram stain     Status: None   Collection Time: 03/07/15  2:16 PM  Result Value Ref Range Status   Specimen Description FLUID LEFT PLEURAL  Final   Special Requests BAA 10MLS LEFT CHEST  Final   Gram Stain RARE POLYMORPHONUCLEAR NO ORGANISMS SEEN   Final   Report Status 03/09/2015 FINAL  Final    Radiology Reports Dg Chest 1 View  03/07/2015   CLINICAL DATA:  Status post left thoracentesis, COPD, pleural effusions, shortness of breath  EXAM: CHEST  1 VIEW  COMPARISON:  03/06/2015, 03/05/2015  FINDINGS: Negative for pneumothorax following left thoracentesis. Small residual pleural effusions present bilaterally. Heart is enlarged. Diffuse interstitial opacities throughout the lungs compatible with mild edema superimposed on COPD. Postop changes in the left axilla.  IMPRESSION: Negative for pneumothorax following left thoracentesis.  Small effusions and edema persist compatible with CHF  Background COPD/emphysema   Electronically Signed   By: Jerilynn Mages.  Shick M.D.   On:  03/07/2015 14:40   Dg Chest 2 View  03/08/2015   CLINICAL DATA:  Shortness of breath for 3 weeks  EXAM: CHEST  2 VIEW  COMPARISON:  March 07, 2015  FINDINGS: There are persistent small pleural effusions, slightly larger on the left than on the right. There is mild interstitial edema superimposed on more chronic scarring. Heart is enlarged. There is slight pulmonary venous hypertension. There is postoperative change on the left with surgical clips present. No adenopathy appreciable.  IMPRESSION: Findings felt to represent a degree of congestive heart failure superimposed on chronic underlying interstitial disease. No appreciable airspace consolidation. No adenopathy.   Electronically Signed   By: Lowella Grip III M.D.   On: 03/08/2015 19:09   Dg Chest 2 View  03/05/2015   CLINICAL DATA:  81 year old female with intermittent chest pain  EXAM: CHEST  2 VIEW  COMPARISON:  Radiograph dated 02/22/2015  FINDINGS: Two views of  the chest demonstrate emphysematous changes of the lungs. There is opacification of the left lower lobe likely combination of pleural effusion with associated atelectasis of the adjacent lung or pneumonia. A small right pleural effusion may be present. Stable cardiomegaly. No pneumothorax. Left chest wall surgical clips.  IMPRESSION: Bilateral pleural effusions, left greater right right with left lower lobe atelectasis/pneumonia. Clinical correlation and follow-up recommended.   Electronically Signed   By: Anner Crete M.D.   On: 03/05/2015 17:24   Dg Chest 2 View  02/22/2015   CLINICAL DATA:  Acute on chronic congestive heart failure. COPD. Myelodysplastic syndrome.  EXAM: CHEST  2 VIEW  COMPARISON:  02/18/2015  FINDINGS: Moderate cardiomegaly stable. Increased diffuse interstitial prominence is suspicious for mild interstitial edema. New small bilateral pleural effusions also seen. Pulmonary hyperinflation again demonstrated, consistent with COPD. Surgical clips again seen in the  left axilla.  IMPRESSION: Increased diffuse interstitial infiltrates/edema and small bilateral pleural effusions, suspicious for congestive heart failure.  Stable cardiomegaly and COPD.   Electronically Signed   By: Earle Gell M.D.   On: 02/22/2015 14:47   Dg Chest 2 View  02/18/2015   CLINICAL DATA:  Fever  EXAM: CHEST  2 VIEW  COMPARISON:  09/15/2014  FINDINGS: Chronic cardiomegaly. Stable mild aortic tortuosity when accounting for rotation. Chronic interstitial coarsening and hyperinflation, bronchitic and emphysematous disease based on 12/19/2013 chest CT. No superimposed consolidation. Small bilateral pleural effusions are present. Status post left breast and axillary surgery.  IMPRESSION: COPD and trace bilateral pleural effusion. No pneumonia to explain the history of fever.   Electronically Signed   By: Monte Fantasia M.D.   On: 02/18/2015 03:23   Ct Head Wo Contrast  02/18/2015   CLINICAL DATA:  Blurred vision, generalized weakness.  EXAM: CT HEAD WITHOUT CONTRAST  TECHNIQUE: Contiguous axial images were obtained from the base of the skull through the vertex without intravenous contrast.  COMPARISON:  CT scan of September 15, 2014.  FINDINGS: Bony calvarium appears intact. Mild diffuse cortical atrophy is noted. Mild chronic ischemic white matter disease is noted. Old lacunar infarctions are noted in the right basal ganglia which are unchanged compared to prior exam. No mass effect or midline shift is noted. Ventricular size is within normal limits. There is no evidence of mass lesion, hemorrhage or acute infarction.  IMPRESSION: Mild diffuse cortical atrophy. Mild chronic ischemic white matter disease. No acute intracranial abnormality seen.   Electronically Signed   By: Marijo Conception, M.D.   On: 02/18/2015 08:45   Ct Angio Chest Pe W/cm &/or Wo Cm  03/06/2015   CLINICAL DATA:  81 year old with 2 week history of shortness of breath associated with mid chest pain. Personal history of left breast  cancer.  EXAM: CT ANGIOGRAPHY CHEST WITH CONTRAST  TECHNIQUE: Multidetector CT imaging of the chest was performed using the standard protocol during bolus administration of intravenous contrast. Multiplanar CT image reconstructions and MIPs were obtained to evaluate the vascular anatomy.  CONTRAST:  125m OMNIPAQUE IOHEXOL 350 MG/ML IV.  COMPARISON:  CT chest 12/19/2013.  FINDINGS: Contrast opacification of the pulmonary arteries is fair at best. Respiratory motion blurred images of the lung bases. Overall, the study is less than optimal.  No filling defects within either main pulmonary artery or the air central branches in either lung. Heart enlarged with evidence of left ventricular hypertrophy. Severe 3 vessel coronary atherosclerosis. Prominent epicardial fat. No pericardial effusion. Moderate atherosclerosis involving the thoracic and upper abdominal aorta without  aneurysm or dissection.  Emphysematous changes throughout both lungs. Mild diffuse interstitial pulmonary edema. Large left pleural effusion and moderate sized right pleural effusion. Associated dense passive atelectasis in the lower lobes, left greater than right. Fluid in the major fissure on the right. Central airways patent with no significant bronchial wall thickening.  No significant mediastinal, hilar or axillary lymphadenopathy. Visualized thyroid gland unremarkable.  Visualized upper abdomen unremarkable for the early arterial phase of enhancement. Bone window images demonstrate mid and lower thoracic spondylosis, mild osseous demineralization and exaggeration of the usual thoracic kyphosis.  Review of the MIP images confirms the above findings.  IMPRESSION: 1. No evidence of central pulmonary embolism. 2. Mild CHF with cardiomegaly and mild diffuse interstitial pulmonary edema. 3. Large left pleural effusion and moderate sized right pleural effusion. Associated dense passive atelectasis in the lower lobes, left greater than right.    Electronically Signed   By: Evangeline Dakin M.D.   On: 03/06/2015 22:57   US Thoracentesis Asp Pleural Space W/img Guide  03/07/2015   INDICATION: Symptomatic left sided pleural effusion  EXAM: US THORACENTESIS ASP PLEURAL SPACE W/IMG GUIDE  COMPARISON:  CT Chest 03/06/15.  MEDICATIONS: None  COMPLICATIONS: None immediate  TECHNIQUE: Informed written consent was obtained from the patient's daughter after a discussion of the risks, benefits and alternatives to treatment. A timeout was performed prior to the initiation of the procedure.  Initial ultrasound scanning demonstrates a left pleural effusion. The lower chest was prepped and draped in the usual sterile fashion. 1% lidocaine with epinephrine was used for local anesthesia.  Under direct ultrasound guidance, a 19 gauge, 7-cm, Yueh catheter was introduced. An ultrasound image was saved for documentation purposes. The thoracentesis was performed. The catheter was removed and a dressing was applied. The patient tolerated the procedure well without immediate post procedural complication. The patient was escorted to have an upright chest radiograph.  FINDINGS: A total of approximately 750 ml of serous fluid was removed. Requested samples were sent to the laboratory.  IMPRESSION: Successful ultrasound-guided left sided thoracentesis yielding 750 ml of pleural fluid.  Read By:  [name] PA-C   Electronically Signed   By: Aletta Edouard M.D.   On: 03/07/2015 14:05    CBC  Recent Labs Lab 03/05/15 2240 03/06/15 1336 03/07/15 0535 03/08/15 0529 03/09/15 0608 03/10/15 0458  WBC  --  18.5* 22.9* 21.1* 17.2* 14.9*  HGB  --  10.8* 10.3* 10.0* 9.7* 8.7*  HCT  --  31.6* 30.8* 29.8* 28.9* 26.5*  PLT  --  26* 28* 23* 21* 27*  MCV  --  88.5 88.8 90.6 90.0 93.0  MCH  --  30.3 29.7 30.4 30.2 30.5  MCHC  --  34.2 33.4 33.6 33.6 32.8  RDW  --  17.3* 17.8* 18.1* 17.9* 18.2*  LYMPHSABS 3.1 1.7  --   --   --   --   MONOABS 9.1* 4.8*  --   --   --   --     EOSABS 0.0 0.0  --   --   --   --   BASOSABS 0.0 0.0  --   --   --   --     Chemistries   Recent Labs Lab 03/06/15 1336 03/07/15 0535 03/08/15 0529 03/09/15 0608 03/10/15 0458  NA 133* 133* 133* 132* 135  K 3.6 3.8 3.3* 4.1 4.1  CL 98* 96* 95* 97* 96*  CO2 '29 27 28 28 30  ' GLUCOSE 128* 104* 147* 87  90  BUN 16 15 22* 21* 21*  CREATININE 0.64 0.52 0.58 0.55 0.41*  CALCIUM 7.8* 8.1* 8.0* 7.9* 7.9*  AST 24  --   --   --   --   ALT 17  --   --   --   --   ALKPHOS 61  --   --   --   --   BILITOT 2.4*  --   --   --   --    ------------------------------------------------------------------------------------------------------------------ estimated creatinine clearance is 39.5 mL/min (by C-G formula based on Cr of 0.41). ------------------------------------------------------------------------------------------------------------------ No results for input(s): HGBA1C in the last 72 hours. ------------------------------------------------------------------------------------------------------------------ No results for input(s): CHOL, HDL, LDLCALC, TRIG, CHOLHDL, LDLDIRECT in the last 72 hours. ------------------------------------------------------------------------------------------------------------------ No results for input(s): TSH, T4TOTAL, T3FREE, THYROIDAB in the last 72 hours.  Invalid input(s): FREET3 ------------------------------------------------------------------------------------------------------------------ No results for input(s): VITAMINB12, FOLATE, FERRITIN, TIBC, IRON, RETICCTPCT in the last 72 hours.  Coagulation profile  Recent Labs Lab 03/05/15 2240  INR 1.25    No results for input(s): DDIMER in the last 72 hours.  Cardiac Enzymes  Recent Labs Lab 03/06/15 1933 03/06/15 2344 03/07/15 0535  TROPONINI <0.03 <0.03 0.03   ------------------------------------------------------------------------------------------------------------------ Invalid input(s):  POCBNP    Jonathon Tan D.O. on 03/11/2015 at 2:56 PM  Between 7am to 7pm - Pager - [phone]  After 7pm go to [email or web address] - password [email or web address]  And look for the night coverage person covering for me after hours  [facility] Office  [phone]

## 2015-03-11 NOTE — Clinical Social Work Note (Signed)
CSW visited room and talked with patient and daughter Tonya Mckee who was at the bedside. CSW explaiined purpose of visit - to provide facility responses for rehab. Ms. Tonya Mckee explained that patient's oncologist had just left the room and the family wants [facility]. CSW advised daughter that f/u will be made with the attending MD and then back with family.  Tonya Mckee, MSW, LCSW Licensed Clinical Social Worker [place] [phone]

## 2015-03-12 DIAGNOSIS — J449 Chronic obstructive pulmonary disease, unspecified: Secondary | ICD-10-CM

## 2015-03-12 DIAGNOSIS — R0600 Dyspnea, unspecified: Secondary | ICD-10-CM

## 2015-03-12 DIAGNOSIS — Z7189 Other specified counseling: Secondary | ICD-10-CM

## 2015-03-12 DIAGNOSIS — I209 Angina pectoris, unspecified: Secondary | ICD-10-CM | POA: Insufficient documentation

## 2015-03-12 LAB — BASIC METABOLIC PANEL
ANION GAP: 7 (ref 5–15)
BUN: 30 mg/dL — AB (ref 6–20)
CO2: 34 mmol/L — ABNORMAL HIGH (ref 22–32)
Calcium: 7.9 mg/dL — ABNORMAL LOW (ref 8.9–10.3)
Chloride: 95 mmol/L — ABNORMAL LOW (ref 101–111)
Creatinine, Ser: 0.55 mg/dL (ref 0.44–1.00)
GFR calc Af Amer: >60 mL/min (ref >60)
GFR calc non Af Amer: >60 mL/min (ref >60)
GLUCOSE: 97 mg/dL (ref 65–99)
POTASSIUM: 3.4 mmol/L — AB (ref 3.5–5.1)
SODIUM: 136 mmol/L (ref 135–145)

## 2015-03-12 LAB — CULTURE, BODY FLUID W GRAM STAIN -BOTTLE

## 2015-03-12 LAB — CBC
HEMATOCRIT: 23.9 % — AB (ref 36.0–46.0)
Hemoglobin: 7.8 g/dL — ABNORMAL LOW (ref 12.0–15.0)
MCH: 30.6 pg (ref 26.0–34.0)
MCHC: 32.6 g/dL (ref 30.0–36.0)
MCV: 93.7 fL (ref 78.0–100.0)
Platelets: 20 10*3/uL — CL (ref 150–400)
RBC: 2.55 MIL/uL — AB (ref 3.87–5.11)
RDW: 18.3 % — ABNORMAL HIGH (ref 11.5–15.5)
WBC: 13.1 10*3/uL — ABNORMAL HIGH (ref 4.0–10.5)

## 2015-03-12 LAB — CULTURE, BODY FLUID-BOTTLE: CULTURE: NO GROWTH

## 2015-03-12 MED ORDER — MORPHINE SULFATE 2 MG/ML IJ SOLN
1.0000 mg | INTRAMUSCULAR | Status: DC | PRN
  Administered 2015-03-12 – 2015-03-13 (×11): 1 mg via INTRAVENOUS
  Filled 2015-03-12 (×11): qty 1

## 2015-03-12 MED ORDER — FUROSEMIDE 10 MG/ML IJ SOLN
INTRAMUSCULAR | Status: AC
  Administered 2015-03-12: 20 mg via INTRAVENOUS
  Filled 2015-03-12: qty 4

## 2015-03-12 MED ORDER — BISACODYL 10 MG RE SUPP
10.0000 mg | Freq: Every day | RECTAL | Status: DC | PRN
  Administered 2015-03-13: 10 mg via RECTAL
  Filled 2015-03-12: qty 1

## 2015-03-12 NOTE — Care Management Important Message (Signed)
Important Message  Patient Details  Name: Tonya Mckee MRN: [id number] Date of Birth: June 01, 1929   Medicare Important Message Given:  Yes-third notification given    Delorse Lek 03/12/2015, 11:06 AM

## 2015-03-12 NOTE — Progress Notes (Signed)
Chaplain responded to consult.  Patient, daughter and chaplain explored the spiritual and reality of what when happen to her when she leaves the hospital. We also discuss death and her wishes for her funeral arrangements. [name], wants to know that her daughter will be okay and the family.   03/12/15 1200  Clinical Encounter Type  Visited With Patient and family together;Health care provider  Visit Type Follow-up;Spiritual support  Referral From Nurse;Other (Comment)  Consult/Referral To Chaplain  Spiritual Encounters  Spiritual Needs Prayer;Emotional;Grief support  Stress Factors  Patient Stress Factors Health changes

## 2015-03-12 NOTE — Progress Notes (Signed)
Daily Progress Note   Patient Name: Tonya Mckee       Date: 03/12/2015 DOB: 07-30-1929  Age: 81 y.o. MRN#: [id number] Attending Physician: Cristal Ford, DO Primary Care Physician: [name] Admit Date: 03/05/2015  Reason for Consultation/Follow-up: Establishing goals of care, Non pain symptom management, Pain control and Psychosocial/spiritual support  Subjective:     -spoke to daughter [name] late yesterday evening  to discuss diagnosis, prognosis, GOC, EOL wishes disposition and options.  -continued conversation with patient and her daughter [name] at bedside this morning   -all are supportive of a comfort path, letting "nature take its course".  Hopeful for transition to residential hospice -  -The difference between a aggressive medical intervention path and a palliative comfort care path for this patient at this time was detailed.   - Natural trajectory and expectations at EOL were discussed.  Questions and concerns addressed.  PMT will continue to support holistically.   Length of Stay: 7 days  Current Medications: Scheduled Meds:  . digoxin  0.125 mg Oral Daily  . famotidine  10 mg Oral BID  . FLUoxetine  30 mg Oral q morning - 10a  . furosemide  20 mg Intravenous BID  . levalbuterol  1.25 mg Nebulization BID  . metoprolol succinate  50 mg Oral QPM  . predniSONE  10 mg Oral Q breakfast  . tiotropium  18 mcg Inhalation Daily    Continuous Infusions:    PRN Meds: acetaminophen, bisacodyl, ketorolac, levalbuterol, menthol-cetylpyridinium, metoprolol, morphine injection, senna, sorbitol  Palliative Performance Scale: 30% at best     Vital Signs: BP 146/63 mmHg  Pulse 79  Temp(Src) 98 F (36.7 C) (Oral)  Resp 16  Ht 5\' 1"  (1.549 m)  Wt 45.813 kg (101 lb)  BMI 19.09 kg/m2  SpO2 99% SpO2: SpO2: 99 % O2 Device: O2 Device: Not Delivered O2 Flow Rate: O2 Flow Rate (L/min): 4 L/min  Intake/output summary:   Intake/Output Summary (Last 24  hours) at 03/12/15 0918 Last data filed at 03/12/15 0600  Gross per 24 hour  Intake    340 ml  Output    800 ml  Net   -460 ml   LBM: Last BM Date: 03/09/15 Baseline Weight: Weight: 50.9 kg (112 lb 3.4 oz) Most recent weight: Weight: 45.813 kg (101 lb)  Physical Exam:              General: ill appearing, frail HEENT: moist buccal membranes, no exudate CVS: RRR Resp: decreased in bases    Additional Data Reviewed: Recent Labs     03/10/15  0458  03/12/15  0807  WBC  14.9*  13.1*  HGB  8.7*  7.8*  PLT  27*  20*  NA  135   --   BUN  21*   --   CREATININE  0.41*   --      Problem List:  Patient Active Problem List   Diagnosis Date Noted  . Palliative care encounter 03/10/2015  . DNR (do not resuscitate) discussion 03/10/2015  . HCAP (healthcare-associated pneumonia) 03/05/2015  . Atrial fibrillation 02/20/2015  . SSS (sick sinus syndrome) 02/20/2015  . Tachycardia-bradycardia   . Fever 02/18/2015  . General weakness 02/18/2015  . COPD exacerbation 02/18/2015  . Blurred vision, bilateral 01/30/2015  . Protein calorie malnutrition 12/12/2014  . Poor dentition 12/12/2014  . Orthostatic hypotension 10/14/2014  . Weakness 10/14/2014  . Dehydration 10/14/2014  . Fall 09/15/2014  . Fracture of lumbar spine L 1-3  transverse process 09/15/2014  . Lumbar transverse process fracture   . Other constipation 09/02/2014  . Thrombocytopenia due to drugs 08/14/2014  . Acute gastroenteritis 07/30/2014  . Elevated transaminase level 07/30/2014  . Hyponatremia 07/30/2014  . Hypokalemia 07/30/2014  . Abnormal LFTs (liver function tests)   . Chronic respiratory failure   . Hypotension 07/14/2014  . Anemia in neoplastic disease 04/24/2014  . Persistent headaches 02/26/2014  . Tobacco abuse 01/30/2014  . Occlusion and stenosis of carotid artery without mention of cerebral infarction 11/06/2012  . Dizziness and giddiness 11/06/2012  . Essential hypertension 01/02/2012  .  Depression 01/02/2012  . COPD GOLD II 01/02/2012  . MDS (myelodysplastic syndrome) with 5q deletion 01/02/2012     Palliative Care Assessment & Plan    Code Status:   DNR- comfort is main focus of care  Goals of Care:   Comfort, quality and dignity is focus of care.  No further diagnostics, artifical feeding or hydration, IV medications, avoid re hospitalizations.   3. Symptom Management:   Pain/Dyspnea: Utilize morphine 1 mg IV every 1 hr prn.  Patient c/o "chest and back pain", she has periods of diaphoresis, I feel this pain may be cardiac in origin.    5. Prognosis:  Less than a few weeks with this shift to full comfort.  Very little po intake, and sleeping most of the da.  5. Discharge Planning: Hopeful for residential hospice, will write for choice   Care plan was discussed with Dr Ree Kida  Thank you for allowing the Palliative Medicine Team to assist in the care of this patient.   Time In:  0800 Time Out: 0835 Total Time 35 min Prolonged Time Billed  no     Greater than 50%  of this time was spent counseling and coordinating care related to the above assessment and plan.   Knox Royalty, NP  03/12/2015, 9:18 AM  Please contact Palliative Medicine Team phone at [phone] for questions and concerns.

## 2015-03-12 NOTE — Progress Notes (Signed)
Per Joaquim Lai, pt family member at beside request that i stop Q2t the pt.  Pt is not turning herself.

## 2015-03-12 NOTE — Clinical Social Work Note (Signed)
Patient is now comfort care and CSW received consult for residential hospice placement. CSW talked with daughter at the bedside, Gorden Harms and choice offered for a hospice facility and hospice list provided. [facility] requested. Contact made with Erling Conte, CSW with [facility] and there is currently no bed availability. Family then chose [facility] and nurse liaison Diane contacted. She met with family this evening and will complete paperwork and patient should be able to d/c to [facility] Friday, 7/29.   Margarita Bobrowski Givens, MSW, LCSW Licensed Clinical Social Worker [place] [phone]

## 2015-03-12 NOTE — Progress Notes (Signed)
[facility]                                                                              Patient Demographics  Tonya Mckee, is a 81 y.o. female, DOB - [birth year], [id number]  Admit date - 03/05/2015   Admitting Physician Sid Falcon, MD  Outpatient Primary MD for the patient is [name]  LOS - 7   Chief Complaint  Patient presents with  . Chest Pain      HPI on 03/05/2015 by Dr. Gilles Chiquito Tonya Mckee is an 81yo woman with PMH of MDS, HTN, COPD on home O2 (2-4L), Afib with SSS, malnutrition, GERD, Dementia, chronic headaches, arthritis, nursing home resident who presents for chest pain and SOB for a few days. Tonya Mckee reports that for the last few days she has noticed frontal chest pain that is worse with deep inspiration. She further notes back pain. The chest pain is difficult for her to describe but is anterior, substernal and on both sides of her sternum. She also notes SOB and reports a need to use more oxygen (however, it is not clear how much oxygen she was using at the nursing home, she is on 4LNC here in the ED). She denies fever, chills, hemoptysis or any other symptoms at this time. She was recently in the hospital this month for a COPD exacerbation. On CXR in the ED, she was found to have bilateral pleural effusions and a left sided pneumonia. Her WBC is chronically elevated, but is doubled today to 24. Differential not done in the ED and is pending. She was given vancomycin and zosyn in the ED.  Of note, Tonya Mckee has MDS which has previously been treated with revlimid, however, this was held in the setting of a dental infection apparently. She is currently on a taper of steroids and is supposed to be on 55m per day currently. She has chronically low Hgb/anemia due to her MDS and is to receive 2 units of PRBCs if Hgb drops below 8 (today it is 7.6). She also has had persistently low platelets, thought to be due to receiving Abx for a  dental infection. Today they are 328 She has significant bruising and petechiae on chest. She reports these are chronic due to her MDS.  NH documentation lists her as Full code and I confirmed this with daughter on the phone  Assessment & Plan   Sepsis secondary to HCAP (healthcare-associated pneumonia) - Upon admission, pt met criteria for sepsis with WBC 21K, RR up to 26 bpm, source PNA  - Pneumonia apparent on CXR, she was recently in the hospital and lives in a NH, ABX broadened to cover for HCAP  - continue vanc and aztreonam day #6/7, transitioned to Zithromax   - antibiotics discontinued as patient completed course - Urinary antigens for legionella and strep negative to date  - Sputum culture also requested and negative to date  - Blood culture X 2 no growth to date - continue to keep on oxygen via [place] to keep O2 sat > 92%  Acute on chronic respiratory failure, oxygen dependent at home 2-4 L via [place] secondary to COPD - now with  HCAP and bilateral pleural effusions, acute on chronic diastolic CHF  - ABX as noted above and keep on oxygen via [place] - BD as needed  - IR consulted for thoracentesis (left side first), now status post left thoracentesis 7/23, prelim report exudate and cytology with inflammatory cells and no signs of malignant cells, no growth to date  - continue Lasix   Bilateral pleural effusions - appears to be secondary to acute on chronic diastolic CHF but given underlying MDS, need to r/o malignant cause - IR assistance appreciated for doing thojavascript:;racentesis on the left side, 750 cc fluid removed, exudate by analysis, final report pending but preliminary cytology with no malignant cells  - placed on Lasix 20 mg IV BID, will continue same regimen  - ECHO notable for grade II diastolic CHF  - weight trend since admission: 112 lbs --> 121 lbs --> 112 --> 110 lbs this AM - monitor daily weights, strict I/O - repeat CXR as needed to follow up    Hypokalemia - resolved   Back pain - unclear if related to acute illness vs MDS - no focal neurological findings on exam  - provide analgesia as needed for now - try to get OOB to chair   Chest pain - Pleuritic in nature and very hard for her to localize - secondary to pleural effusions, HCAP, demand ischemia - troponin initially elevated due to demand ischemia  - no chest pain this AM, trop trending down and WNL x 3 sets  - CT chest angio negative for PE but notable for bilateral pleural effusions worse on the left side  MDS (myelodysplastic syndrome) with 5q deletion, anemia with Hgb < 8 - She is on chronic steroids at home, presumably she received this the day of the admission. - pt has been transfused two U PRBC on admission - last Hb 7.8 - Dr. Alvy Bimler consulted and appreciated - Spoke to Dr. Alvy Bimler about palliative care - Palliative care consulted  - Patient has been made a full DNR, and will be discharged to residential hospice when bed is made available.   Thrombocytopenia - per oncology's last note, low platelets thought to be due to recent Abx for dental infection vs MDS itself -Plt  20  Essential hypertension - Continue metoprolol for Afib - continue Lasix 20 mg IV BID    COPD GOLD II - Minimal wheezing on exam, so pneumonia and pleural effusions likely cause of SOB - Continue spiriva - Duonebs as needed   Persistent headaches - Chronic, monitor - Per patient's daughter, narcotics should be avoided b/c of over sedation (not surprising given cachexia)   Hyponatremia - Possibly related to acute lung disorder ongoing - resolved    Inadequate oral intake in the setting of acute illness  - Ensure TID - nutritionist consulted    Atrial fibrillation with SSS, CHADS 2 = 3-4  - Continue metoprolol as needed for now  - Continue digoxin - not AC candidate due to low plt and propensity for bleed - daughter in agreement to avoid [facility]   Goals  of care - Palliative care consultation appreciated - Spoke with daughter at bedside this morning regarding possible rehabilitation versus hospice - Patient and family agreed to hospice  Code Status: DNR  Family Communication: Daughter at bedside  Disposition Plan: Admitted.  Pending placement  Time Spent in minutes   30 minutes  Procedures  left thoracentesis  Consults   Palliative care Oncology Interventional radiology  DVT Prophylaxis  SCDs  Lab Results  Component Value Date   PLT 20* 03/12/2015    Medications  Scheduled Meds: . digoxin  0.125 mg Oral Daily  . famotidine  10 mg Oral BID  . FLUoxetine  30 mg Oral q morning - 10a  . furosemide  20 mg Intravenous BID  . levalbuterol  1.25 mg Nebulization BID  . metoprolol succinate  50 mg Oral QPM  . predniSONE  10 mg Oral Q breakfast  . tiotropium  18 mcg Inhalation Daily   Continuous Infusions:  PRN Meds:.acetaminophen, bisacodyl, ketorolac, levalbuterol, menthol-cetylpyridinium, metoprolol, morphine injection, senna, sorbitol  Antibiotics    Anti-infectives    Start     Dose/Rate Route Frequency Ordered Stop   03/10/15 1700  azithromycin (ZITHROMAX) tablet 250 mg     250 mg Oral Daily 03/10/15 1628 03/11/15 1037   03/09/15 0600  vancomycin (VANCOCIN) 500 mg in sodium chloride 0.9 % 100 mL IVPB  Status:  Discontinued     500 mg 100 mL/hr over 60 Minutes Intravenous Every 12 hours 03/08/15 1853 03/10/15 1627   03/06/15 1800  vancomycin (VANCOCIN) IVPB 750 mg/150 ml premix  Status:  Discontinued     750 mg 150 mL/hr over 60 Minutes Intravenous Every 24 hours 03/05/15 2322 03/08/15 1853   03/05/15 2330  aztreonam (AZACTAM) 1 g in dextrose 5 % 50 mL IVPB  Status:  Discontinued     1 g 100 mL/hr over 30 Minutes Intravenous 3 times per day 03/05/15 2322 03/10/15 1627   03/05/15 1830  vancomycin (VANCOCIN) IVPB 1000 mg/200 mL premix     1,000 mg 200 mL/hr over 60 Minutes Intravenous  Once 03/05/15 1815 03/05/15  1933      Subjective:   Lars Masson seen and examined today.  Patient complains of feeling weak and back pain. Denies chest pain.   Objective:   Filed Vitals:   03/11/15 2026 03/12/15 0641 03/12/15 0955 03/12/15 1004  BP: 134/59 146/63  140/65  Pulse: 77 79  69  Temp: 98 F (36.7 C) 98 F (36.7 C)  98.2 F (36.8 C)  TempSrc:    Oral  Resp: '18 16  16  ' Height:      Weight: 45.813 kg (101 lb)     SpO2: 99% 99% 96% 95%    Wt Readings from Last 3 Encounters:  03/11/15 45.813 kg (101 lb)  03/02/15 51.891 kg (114 lb 6.4 oz)  02/24/15 56.246 kg (124 lb)     Intake/Output Summary (Last 24 hours) at 03/12/15 1309 Last data filed at 03/12/15 0951  Gross per 24 hour  Intake    480 ml  Output    600 ml  Net   -120 ml    Exam  General: Well developed, malnourished, no distress  HEENT: NCAT, mucous membranes moist.   Cardiovascular: S1 S2 auscultated, irregular, 2/6 SEM  Respiratory: Diminished breath sounds with scattered wheezing  Extremities: warm dry without cyanosis clubbing or edema  Data Review   Micro Results Recent Results (from the past 240 hour(s))  MRSA PCR Screening     Status: None   Collection Time: 03/05/15  8:42 PM  Result Value Ref Range Status   MRSA by PCR NEGATIVE NEGATIVE Final    Comment:        The GeneXpert MRSA Assay (FDA approved for NASAL specimens only), is one component of a comprehensive MRSA colonization surveillance program. It is not intended to diagnose MRSA infection nor to guide or monitor treatment for MRSA  infections.   Culture, blood (routine x 2) Call MD if unable to obtain prior to antibiotics being given     Status: None   Collection Time: 03/05/15 10:30 PM  Result Value Ref Range Status   Specimen Description BLOOD RIGHT ARM  Final   Special Requests   Final    BOTTLES DRAWN AEROBIC AND ANAEROBIC 10CC BLUE 8CC RED   Culture NO GROWTH 5 DAYS  Final   Report Status 03/10/2015 FINAL  Final  Culture, blood  (routine x 2) Call MD if unable to obtain prior to antibiotics being given     Status: None   Collection Time: 03/05/15 10:40 PM  Result Value Ref Range Status   Specimen Description BLOOD RIGHT ARM  Final   Special Requests   Final    BOTTLES DRAWN AEROBIC AND ANAEROBIC 10CC BLUE [place] RED   Culture NO GROWTH 5 DAYS  Final   Report Status 03/10/2015 FINAL  Final  Culture, body fluid-bottle     Status: None   Collection Time: 03/07/15  2:16 PM  Result Value Ref Range Status   Specimen Description FLUID LEFT PLEURAL  Final   Special Requests   Final    BOTTLES DRAWN AEROBIC AND ANAEROBIC 10MLS LEFT CHEST   Culture NO GROWTH 5 DAYS  Final   Report Status 03/12/2015 FINAL  Final  Gram stain     Status: None   Collection Time: 03/07/15  2:16 PM  Result Value Ref Range Status   Specimen Description FLUID LEFT PLEURAL  Final   Special Requests BAA 10MLS LEFT CHEST  Final   Gram Stain RARE POLYMORPHONUCLEAR NO ORGANISMS SEEN   Final   Report Status 03/09/2015 FINAL  Final    Radiology Reports Dg Chest 1 View  03/07/2015   CLINICAL DATA:  Status post left thoracentesis, COPD, pleural effusions, shortness of breath  EXAM: CHEST  1 VIEW  COMPARISON:  03/06/2015, 03/05/2015  FINDINGS: Negative for pneumothorax following left thoracentesis. Small residual pleural effusions present bilaterally. Heart is enlarged. Diffuse interstitial opacities throughout the lungs compatible with mild edema superimposed on COPD. Postop changes in the left axilla.  IMPRESSION: Negative for pneumothorax following left thoracentesis.  Small effusions and edema persist compatible with CHF  Background COPD/emphysema   Electronically Signed   By: Jerilynn Mages.  Shick M.D.   On: 03/07/2015 14:40   Dg Chest 2 View  03/08/2015   CLINICAL DATA:  Shortness of breath for 3 weeks  EXAM: CHEST  2 VIEW  COMPARISON:  March 07, 2015  FINDINGS: There are persistent small pleural effusions, slightly larger on the left than on the right. There is  mild interstitial edema superimposed on more chronic scarring. Heart is enlarged. There is slight pulmonary venous hypertension. There is postoperative change on the left with surgical clips present. No adenopathy appreciable.  IMPRESSION: Findings felt to represent a degree of congestive heart failure superimposed on chronic underlying interstitial disease. No appreciable airspace consolidation. No adenopathy.   Electronically Signed   By: Lowella Grip III M.D.   On: 03/08/2015 19:09   Dg Chest 2 View  03/05/2015   CLINICAL DATA:  81 year old female with intermittent chest pain  EXAM: CHEST  2 VIEW  COMPARISON:  Radiograph dated 02/22/2015  FINDINGS: Two views of the chest demonstrate emphysematous changes of the lungs. There is opacification of the left lower lobe likely combination of pleural effusion with associated atelectasis of the adjacent lung or pneumonia. A small right pleural effusion may be  present. Stable cardiomegaly. No pneumothorax. Left chest wall surgical clips.  IMPRESSION: Bilateral pleural effusions, left greater right right with left lower lobe atelectasis/pneumonia. Clinical correlation and follow-up recommended.   Electronically Signed   By: Anner Crete M.D.   On: 03/05/2015 17:24   Dg Chest 2 View  02/22/2015   CLINICAL DATA:  Acute on chronic congestive heart failure. COPD. Myelodysplastic syndrome.  EXAM: CHEST  2 VIEW  COMPARISON:  02/18/2015  FINDINGS: Moderate cardiomegaly stable. Increased diffuse interstitial prominence is suspicious for mild interstitial edema. New small bilateral pleural effusions also seen. Pulmonary hyperinflation again demonstrated, consistent with COPD. Surgical clips again seen in the left axilla.  IMPRESSION: Increased diffuse interstitial infiltrates/edema and small bilateral pleural effusions, suspicious for congestive heart failure.  Stable cardiomegaly and COPD.   Electronically Signed   By: Earle Gell M.D.   On: 02/22/2015 14:47   Dg  Chest 2 View  02/18/2015   CLINICAL DATA:  Fever  EXAM: CHEST  2 VIEW  COMPARISON:  09/15/2014  FINDINGS: Chronic cardiomegaly. Stable mild aortic tortuosity when accounting for rotation. Chronic interstitial coarsening and hyperinflation, bronchitic and emphysematous disease based on 12/19/2013 chest CT. No superimposed consolidation. Small bilateral pleural effusions are present. Status post left breast and axillary surgery.  IMPRESSION: COPD and trace bilateral pleural effusion. No pneumonia to explain the history of fever.   Electronically Signed   By: Monte Fantasia M.D.   On: 02/18/2015 03:23   Ct Head Wo Contrast  02/18/2015   CLINICAL DATA:  Blurred vision, generalized weakness.  EXAM: CT HEAD WITHOUT CONTRAST  TECHNIQUE: Contiguous axial images were obtained from the base of the skull through the vertex without intravenous contrast.  COMPARISON:  CT scan of September 15, 2014.  FINDINGS: Bony calvarium appears intact. Mild diffuse cortical atrophy is noted. Mild chronic ischemic white matter disease is noted. Old lacunar infarctions are noted in the right basal ganglia which are unchanged compared to prior exam. No mass effect or midline shift is noted. Ventricular size is within normal limits. There is no evidence of mass lesion, hemorrhage or acute infarction.  IMPRESSION: Mild diffuse cortical atrophy. Mild chronic ischemic white matter disease. No acute intracranial abnormality seen.   Electronically Signed   By: Marijo Conception, M.D.   On: 02/18/2015 08:45   Ct Angio Chest Pe W/cm &/or Wo Cm  03/06/2015   CLINICAL DATA:  81 year old with 2 week history of shortness of breath associated with mid chest pain. Personal history of left breast cancer.  EXAM: CT ANGIOGRAPHY CHEST WITH CONTRAST  TECHNIQUE: Multidetector CT imaging of the chest was performed using the standard protocol during bolus administration of intravenous contrast. Multiplanar CT image reconstructions and MIPs were obtained to  evaluate the vascular anatomy.  CONTRAST:  126m OMNIPAQUE IOHEXOL 350 MG/ML IV.  COMPARISON:  CT chest 12/19/2013.  FINDINGS: Contrast opacification of the pulmonary arteries is fair at best. Respiratory motion blurred images of the lung bases. Overall, the study is less than optimal.  No filling defects within either main pulmonary artery or the air central branches in either lung. Heart enlarged with evidence of left ventricular hypertrophy. Severe 3 vessel coronary atherosclerosis. Prominent epicardial fat. No pericardial effusion. Moderate atherosclerosis involving the thoracic and upper abdominal aorta without aneurysm or dissection.  Emphysematous changes throughout both lungs. Mild diffuse interstitial pulmonary edema. Large left pleural effusion and moderate sized right pleural effusion. Associated dense passive atelectasis in the lower lobes, left greater than right. Fluid  in the major fissure on the right. Central airways patent with no significant bronchial wall thickening.  No significant mediastinal, hilar or axillary lymphadenopathy. Visualized thyroid gland unremarkable.  Visualized upper abdomen unremarkable for the early arterial phase of enhancement. Bone window images demonstrate mid and lower thoracic spondylosis, mild osseous demineralization and exaggeration of the usual thoracic kyphosis.  Review of the MIP images confirms the above findings.  IMPRESSION: 1. No evidence of central pulmonary embolism. 2. Mild CHF with cardiomegaly and mild diffuse interstitial pulmonary edema. 3. Large left pleural effusion and moderate sized right pleural effusion. Associated dense passive atelectasis in the lower lobes, left greater than right.   Electronically Signed   By: Evangeline Dakin M.D.   On: 03/06/2015 22:57   US Thoracentesis Asp Pleural Space W/img Guide  03/07/2015   INDICATION: Symptomatic left sided pleural effusion  EXAM: US THORACENTESIS ASP PLEURAL SPACE W/IMG GUIDE  COMPARISON:  CT  Chest 03/06/15.  MEDICATIONS: None  COMPLICATIONS: None immediate  TECHNIQUE: Informed written consent was obtained from the patient's daughter after a discussion of the risks, benefits and alternatives to treatment. A timeout was performed prior to the initiation of the procedure.  Initial ultrasound scanning demonstrates a left pleural effusion. The lower chest was prepped and draped in the usual sterile fashion. 1% lidocaine with epinephrine was used for local anesthesia.  Under direct ultrasound guidance, a 19 gauge, 7-cm, Yueh catheter was introduced. An ultrasound image was saved for documentation purposes. The thoracentesis was performed. The catheter was removed and a dressing was applied. The patient tolerated the procedure well without immediate post procedural complication. The patient was escorted to have an upright chest radiograph.  FINDINGS: A total of approximately 750 ml of serous fluid was removed. Requested samples were sent to the laboratory.  IMPRESSION: Successful ultrasound-guided left sided thoracentesis yielding 750 ml of pleural fluid.  Read By:  [name] PA-C   Electronically Signed   By: Aletta Edouard M.D.   On: 03/07/2015 14:05    CBC  Recent Labs Lab 03/05/15 2240 03/06/15 1336 03/07/15 0535 03/08/15 0529 03/09/15 0608 03/10/15 0458 03/12/15 0807  WBC  --  18.5* 22.9* 21.1* 17.2* 14.9* 13.1*  HGB  --  10.8* 10.3* 10.0* 9.7* 8.7* 7.8*  HCT  --  31.6* 30.8* 29.8* 28.9* 26.5* 23.9*  PLT  --  26* 28* 23* 21* 27* 20*  MCV  --  88.5 88.8 90.6 90.0 93.0 93.7  MCH  --  30.3 29.7 30.4 30.2 30.5 30.6  MCHC  --  34.2 33.4 33.6 33.6 32.8 32.6  RDW  --  17.3* 17.8* 18.1* 17.9* 18.2* 18.3*  LYMPHSABS 3.1 1.7  --   --   --   --   --   MONOABS 9.1* 4.8*  --   --   --   --   --   EOSABS 0.0 0.0  --   --   --   --   --   BASOSABS 0.0 0.0  --   --   --   --   --     Chemistries   Recent Labs Lab 03/06/15 1336 03/07/15 0535 03/08/15 0529 03/09/15 0608 03/10/15 0458  03/12/15 0807  NA 133* 133* 133* 132* 135 136  K 3.6 3.8 3.3* 4.1 4.1 3.4*  CL 98* 96* 95* 97* 96* 95*  CO2 '29 27 28 28 30 ' 34*  GLUCOSE 128* 104* 147* 87 90 97  BUN 16 15 22* 21* 21* 30*  CREATININE 0.64 0.52 0.58 0.55 0.41* 0.55  CALCIUM 7.8* 8.1* 8.0* 7.9* 7.9* 7.9*  AST 24  --   --   --   --   --   ALT 17  --   --   --   --   --   ALKPHOS 61  --   --   --   --   --   BILITOT 2.4*  --   --   --   --   --    ------------------------------------------------------------------------------------------------------------------ estimated creatinine clearance is 37.8 mL/min (by C-G formula based on Cr of 0.55). ------------------------------------------------------------------------------------------------------------------ No results for input(s): HGBA1C in the last 72 hours. ------------------------------------------------------------------------------------------------------------------ No results for input(s): CHOL, HDL, LDLCALC, TRIG, CHOLHDL, LDLDIRECT in the last 72 hours. ------------------------------------------------------------------------------------------------------------------ No results for input(s): TSH, T4TOTAL, T3FREE, THYROIDAB in the last 72 hours.  Invalid input(s): FREET3 ------------------------------------------------------------------------------------------------------------------ No results for input(s): VITAMINB12, FOLATE, FERRITIN, TIBC, IRON, RETICCTPCT in the last 72 hours.  Coagulation profile  Recent Labs Lab 03/05/15 2240  INR 1.25    No results for input(s): DDIMER in the last 72 hours.  Cardiac Enzymes  Recent Labs Lab 03/06/15 1933 03/06/15 2344 03/07/15 0535  TROPONINI <0.03 <0.03 0.03   ------------------------------------------------------------------------------------------------------------------ Invalid input(s): POCBNP    Giada Schoppe D.O. on 03/12/2015 at 1:09 PM  Between 7am to 7pm - Pager - [phone]  After 7pm go  to [email or web address] - password [email or web address]  And look for the night coverage person covering for me after hours  [facility] Office  [phone]

## 2015-03-12 NOTE — Progress Notes (Signed)
03/12/2015 2:00AM  Per daughter request, nurse tech is not to not come in room to Q2 turn patient. Patient's daughter wants patient to get rest for the night. RN will continue to monitor patient.   Ermalinda Memos, RN

## 2015-03-13 DIAGNOSIS — I209 Angina pectoris, unspecified: Secondary | ICD-10-CM

## 2015-03-13 DIAGNOSIS — Z9889 Other specified postprocedural states: Secondary | ICD-10-CM | POA: Insufficient documentation

## 2015-03-13 MED ORDER — MORPHINE SULFATE (CONCENTRATE) 10 MG /0.5 ML PO SOLN: 10.0000 mg | ORAL | Status: AC | PRN

## 2015-03-13 MED ORDER — LORAZEPAM 0.5 MG PO TABS: 0.5000 mg | ORAL_TABLET | Freq: Three times a day (TID) | ORAL | Status: AC | PRN

## 2015-03-13 MED ORDER — FUROSEMIDE 10 MG/ML IJ SOLN
INTRAMUSCULAR | Status: AC
  Administered 2015-03-13: 20 mg via INTRAVENOUS
  Filled 2015-03-13: qty 4

## 2015-03-13 MED ORDER — BISACODYL 10 MG RE SUPP: 10.0000 mg | Freq: Every day | RECTAL | Status: AC | PRN

## 2015-03-13 NOTE — Plan of Care (Signed)
Problem: Phase II Progression Outcomes Goal: Obtain order to discontinue catheter if appropriate Outcome: Not Applicable Date Met:  02/77/41 Patient will be d/c'd with foley catheter.

## 2015-03-13 NOTE — Progress Notes (Signed)
Nutrition Brief Note  Chart reviewed. Pt now comfort care.  No further nutrition interventions warranted at this time.  Please consult as needed.   Corrin Parker, MS, RD, LDN Pager # [phone] After hours/ weekend pager # [phone]

## 2015-03-13 NOTE — Progress Notes (Signed)
Dr. Ree Kida stated patient can be discharged with foley catheter.  Joellen Jersey, RN.

## 2015-03-13 NOTE — Discharge Instructions (Signed)
Hospice °Hospice is a service that is designed to provide people who are terminally ill and their families with medical, spiritual, and psychological support. Its aim is to improve your quality of life by keeping you as alert and comfortable as possible. Hospice is performed by a team of health care professionals and volunteers who: °· Help keep you comfortable. Hospice can be provided in your home or in a homelike setting. The hospice staff works with your family and friends to help meet your needs. You will enjoy the support of loved ones by receiving much of your basic care from family and friends. °· Provide pain relief and manage your symptoms. The staff supply all necessary medicines and equipment. °· Provide companionship when you are alone. °· Allow you and your family to rest. They may do light housekeeping, prepare meals, and run errands. °· Provide counseling. They will make sure your emotional, spiritual, and social needs and those of your family are being met. °· Provide spiritual care. Spiritual care is individualized to meet your needs and your family's needs. It may involve helping you look at what death means to you, say goodbye, or perform a specific religious ceremony or ritual. °Hospice teams often include: °· A nurse. °· A doctor. °· Social workers. °· Religious leaders (such as a chaplain). °· Trained volunteers. °WHEN SHOULD HOSPICE CARE BEGIN? °Most people who use hospice are believed to have fewer than 6 months to live. Your family and health care providers can help you decide when hospice services should begin. If your condition improves, you may discontinue the program. °WHAT SHOULD I CONSIDER BEFORE SELECTING A PROGRAM? °Most hospice programs are run by nonprofit, independent organizations. Some are affiliated with hospitals, nursing homes, or home health care agencies. Hospice programs can take place in the home or at a hospice center, hospital, or skilled nursing facility. When choosing  a hospice program, ask the following questions: °· What services are available to me? °· What services are offered to my loved ones? °· How involved are my loved ones? °· How involved is my health care provider? °· Who makes up the hospice care team? How are they trained or screened? °· How will my pain and symptoms be managed? °· If my circumstances change, can the services be provided in a different setting, such as my home or in the hospital? °· Is the program reviewed and licensed by the state or certified in some other way? °WHERE CAN I LEARN MORE ABOUT HOSPICE? °You can learn about existing hospice programs in your area from your health care providers. You can also read more about hospice online. The websites of the following organizations contain helpful information: °· The National Hospice and Palliative Care Organization (NHPCO). °· The Hospice Association of America (HAA). °· The Hospice Education Institute. °· The American Cancer Society (ACS). °· [employer]. °Document Released: 11/18/2003 Document Revised: 08/06/2013 Document Reviewed: 06/11/2013 °Exit[employer]® Patient Information ©2015 [employer]. This information is not intended to replace advice given to you by your health care provider. Make sure you discuss any questions you have with your health care provider. ° °

## 2015-03-13 NOTE — Progress Notes (Signed)
PT Cancellation Note  Patient Details Name: SYDNE KRAHL MRN: [id number] DOB: 11-May-1929   Cancelled Treatment:    Reason Eval/Treat Not Completed: Medical issues which prohibited therapy.  Patient is now receiving comfort care only.  Is scheduled to transfer to inpatient Hospice facility today.  PT will sign off.  Thank you.   Despina Pole 03/13/2015, 10:14 AM Carita Pian Sanjuana Kava, [place] Pager [phone]

## 2015-03-13 NOTE — Clinical Social Work Note (Signed)
Ms. Petsch is being discharged home today with [facility] services until they have an available bed in their residential hospice facility. Patient will be transported home by ambulance. Family aware and one of patient's daughter's at the bedside.   Niles Ess Givens, MSW, LCSW Licensed Clinical Social Worker [place] [phone]

## 2015-03-13 NOTE — Discharge Summary (Signed)
Physician Discharge Summary  Tonya Mckee JSH:[id number] DOB: 04/05/1929 DOA: 03/05/2015  PCP: [name]  Admit date: 03/05/2015 Discharge date: 03/13/2015  Time spent: 45 minutes  Recommendations for Outpatient Follow-up:  Patient will be discharged to [facility].  She should continue medications as needed. Patient may follow-up with her primary care physician as needed. Continue a regular diet.  Discharge Diagnoses:  Sepsis secondary to HCAP Acute on chronic respiratory failure Bilateral pleural effusion Hypokalemia Back pain Chest pain MDS Thrombocytopenia Essential hypertension COPD Persistent headache Hyponatremia Malnutrition Atrial fibrillation  Discharge Condition: Stable  Diet recommendation: Regular  Filed Weights   03/09/15 2118 03/11/15 2026 03/12/15 2033  Weight: 49.986 kg (110 lb 3.2 oz) 45.813 kg (101 lb) 45.2 kg (99 lb 10.4 oz)    History of present illness:  on 03/05/2015 by Dr. Gilles Chiquito Tonya Mckee is an 81yo woman with PMH of MDS, HTN, COPD on home O2 (2-4L), Afib with SSS, malnutrition, GERD, Dementia, chronic headaches, arthritis, nursing home resident who presents for chest pain and SOB for a few days. Tonya Mckee reports that for the last few days she has noticed frontal chest pain that is worse with deep inspiration. She further notes back pain. The chest pain is difficult for her to describe but is anterior, substernal and on both sides of her sternum. She also notes SOB and reports a need to use more oxygen (however, it is not clear how much oxygen she was using at the nursing home, she is on 4LNC here in the ED). She denies fever, chills, hemoptysis or any other symptoms at this time. She was recently in the hospital this month for a COPD exacerbation. On CXR in the ED, she was found to have bilateral pleural effusions and a left sided pneumonia. Her WBC is chronically elevated, but is doubled today to 24. Differential  not done in the ED and is pending. She was given vancomycin and zosyn in the ED.  Of note, [name] has MDS which has previously been treated with revlimid, however, this was held in the setting of a dental infection apparently. She is currently on a taper of steroids and is supposed to be on 58m per day currently. She has chronically low Hgb/anemia due to her MDS and is to receive 2 units of PRBCs if Hgb drops below 8 (today it is 7.6). She also has had persistently low platelets, thought to be due to receiving Abx for a dental infection. Today they are 339 She has significant bruising and petechiae on chest. She reports these are chronic due to her MDS.  NH documentation lists her as Full code and I confirmed this with daughter on the phone  Hospital Course:  Sepsis secondary to HCAP (healthcare-associated pneumonia) - Upon admission, pt met criteria for sepsis with WBC 21K, RR up to 26 bpm, source PNA  - Pneumonia apparent on CXR, she was recently in the hospital and lives in a NH, ABX broadened to cover for HCAP  - continue vanc and aztreonam day #6/7, transitioned to Zithromax  - antibiotics discontinued as patient completed course - Urinary antigens for legionella and strep negative to date  - Sputum culture also requested and negative to date  - Blood culture X 2 no growth to date - continue to keep on oxygen via [place] to keep O2 sat > 92%  Acute on chronic respiratory failure, oxygen dependent at home 2-4 L via [place] secondary to COPD - now with HCAP and bilateral  pleural effusions, acute on chronic diastolic CHF  - ABX as noted above and keep on oxygen via [place] - BD as needed  - IR consulted for thoracentesis (left side first), now status post left thoracentesis 7/23, prelim report exudate and cytology with inflammatory cells and no signs of malignant cells, no growth to date  - continue Lasix   Bilateral pleural effusions - appears to be secondary to acute on chronic  diastolic CHF but given underlying MDS, need to r/o malignant cause - IR assistance appreciated for doing thojavascript:;racentesis on the left side, 750 cc fluid removed, exudate by analysis, final report pending but preliminary cytology with no malignant cells  - placed on Lasix 20 mg IV BID, will continue same regimen  - ECHO notable for grade II diastolic CHF  - weight trend since admission: 112 lbs --> 121 lbs --> 112 --> 110 lbs this AM - monitor daily weights, strict I/O - repeat CXR as needed to follow up   Hypokalemia - resolved   Back pain - unclear if related to acute illness vs MDS - no focal neurological findings on exam  - provide analgesia as needed for now - try to get OOB to chair   Chest pain - Pleuritic in nature and very hard for her to localize - secondary to pleural effusions, HCAP, demand ischemia - troponin initially elevated due to demand ischemia  - no chest pain this AM, trop trending down and WNL x 3 sets  - CT chest angio negative for PE but notable for bilateral pleural effusions worse on the left side  MDS (myelodysplastic syndrome) with 5q deletion, anemia with Hgb < 8 - She is on chronic steroids at home, presumably she received this the day of the admission. - pt has been transfused two U PRBC on admission - last Hb 7.8 - Dr. Alvy Bimler consulted and appreciated - Spoke to Dr. Alvy Bimler about palliative care - Palliative care consulted  - Patient has been made a full DNR, and will be discharged to residential hospice   Thrombocytopenia - per oncology's last note, low platelets thought to be due to recent Abx for dental infection vs MDS itself -Plt 20  Essential hypertension - Continue metoprolol for Afib - continue Lasix 20 mg IV BID    COPD GOLD II - Minimal wheezing on exam, so pneumonia and pleural effusions likely cause of SOB - Continue spiriva - Duonebs as needed   Persistent headaches - Chronic, monitor - Per patient's  daughter, narcotics should be avoided b/c of over sedation (not surprising given cachexia)   Hyponatremia - Possibly related to acute lung disorder ongoing - resolved    Inadequate oral intake in the setting of acute illness  - Ensure TID - nutritionist consulted    Atrial fibrillation with SSS, CHADS 2 = 3-4  - Continue metoprolol as needed for now  - Continue digoxin - not AC candidate due to low plt and propensity for bleed - daughter in agreement to avoid [facility]   Goals of care - Palliative care consultation appreciated - Spoke with daughter at bedside this morning regarding possible rehabilitation versus hospice - Patient and family agreed to hospice  Procedures  left thoracentesis  Consults  Palliative care Oncology Interventional radiology  Discharge Exam: Filed Vitals:   03/13/15 0804  BP: 156/61  Pulse: 73  Temp: 97.4 F (36.3 C)  Resp: 18   Exam  General: Well developed, malnourished, no distress  HEENT: NCAT, mucous membranes moist.  Cardiovascular: S1 S2 auscultated, irregular, 2/6 SEM  Respiratory: Diminished breath sounds with scattered wheezing  Extremities: warm dry without cyanosis clubbing or edema  Discharge Instructions     Medication List    STOP taking these medications        acetaminophen-codeine 300-30 MG per tablet  Commonly known as:  TYLENOL #3     amLODipine 5 MG tablet  Commonly known as:  NORVASC     losartan 25 MG tablet  Commonly known as:  COZAAR     Vitamin D 2000 UNITS tablet      TAKE these medications        acetaminophen 325 MG tablet  Commonly known as:  TYLENOL  Take 650 mg by mouth every 6 (six) hours as needed for moderate pain (pain).     albuterol 108 (90 BASE) MCG/ACT inhaler  Commonly known as:  PROVENTIL HFA;VENTOLIN HFA  Inhale 2 puffs into the lungs every 6 (six) hours as needed for wheezing or shortness of breath (wheezing).     bisacodyl 10 MG suppository  Commonly known as:   DULCOLAX  Place 1 suppository (10 mg total) rectally daily as needed for mild constipation.     calcium carbonate 500 MG chewable tablet  Commonly known as:  TUMS - dosed in mg elemental calcium  Chew 1 tablet by mouth daily as needed for indigestion or heartburn.     chlorhexidine 0.12 % solution  Commonly known as:  PERIDEX  15 mLs by Mouth Rinse route 2 (two) times daily.     digoxin 0.125 MG tablet  Commonly known as:  LANOXIN  Take 1 tablet (0.125 mg total) by mouth daily.     ENSURE PLUS Liqd  Take 237 mLs by mouth daily.     famotidine 10 MG tablet  Commonly known as:  PEPCID AC  Take 1 tablet (10 mg total) by mouth 2 (two) times daily.     FLUoxetine 10 MG capsule  Commonly known as:  PROZAC  Take 3 capsules (30 mg total) by mouth every morning.     ipratropium-albuterol 0.5-2.5 (3) MG/3ML Soln  Commonly known as:  DUONEB  Take 3 mLs by nebulization 2 (two) times daily as needed (wheezing).     lidocaine 5 %  Commonly known as:  LIDODERM  Place 1 patch onto the skin daily. Remove & Discard patch within 12 hours or as directed by MD     LORazepam 0.5 MG tablet  Commonly known as:  ATIVAN  Take 1 tablet (0.5 mg total) by mouth every 8 (eight) hours as needed for anxiety (anxiety).     menthol-cetylpyridinium 3 MG lozenge  Commonly known as:  CEPACOL  Take 1 lozenge (3 mg total) by mouth as needed for sore throat.     metoprolol succinate 50 MG 24 hr tablet  Commonly known as:  TOPROL-XL  Take 50 mg by mouth every evening. Take with or immediately following a meal.     morphine CONCENTRATE 10 mg / 0.5 ml concentrated solution  Take 0.5 mLs (10 mg total) by mouth every 3 (three) hours as needed for severe pain.     ondansetron 4 MG tablet  Commonly known as:  ZOFRAN  Take 1 tablet (4 mg total) by mouth every 6 (six) hours as needed for nausea.     polyethylene glycol packet  Commonly known as:  MIRALAX / GLYCOLAX  Take 17 g by mouth daily.     predniSONE  10 MG  tablet  Commonly known as:  DELTASONE  Take 10 mg by mouth daily with breakfast.     sorbitol 70 % Soln  Take 30 mLs by mouth daily as needed for moderate constipation.     tiotropium 18 MCG inhalation capsule  Commonly known as:  SPIRIVA  Place 18 mcg into inhaler and inhale daily.     trolamine salicylate 10 % cream  Commonly known as:  ASPERCREME  Apply 1 application topically 2 (two) times daily.       Allergies  Allergen Reactions  . Donepezil Nausea And Vomiting  . Penicillins Itching and Nausea And Vomiting  . Amoxapine And Related Nausea Only  . Doxycycline Rash  . Keflex [Cephalexin] Itching  . Bupropion Other (See Comments)    "Headaches on the XL."   . Cardizem [Diltiazem Hcl] Other (See Comments)    Bradycardia/presyncope  . Ketek [Telithromycin] Itching    Dry Mouth  . Levaquin [Levofloxacin In D5w] Other (See Comments)    Insomnia  . Sulfa Antibiotics Nausea And Vomiting  . Tramadol Other (See Comments)    "Made her feel like a zombie."  . Zyrtec [Cetirizine] Other (See Comments)    "made her like a zombie"      The results of significant diagnostics from this hospitalization (including imaging, microbiology, ancillary and laboratory) are listed below for reference.    Significant Diagnostic Studies: Dg Chest 1 View  03/07/2015   CLINICAL DATA:  Status post left thoracentesis, COPD, pleural effusions, shortness of breath  EXAM: CHEST  1 VIEW  COMPARISON:  03/06/2015, 03/05/2015  FINDINGS: Negative for pneumothorax following left thoracentesis. Small residual pleural effusions present bilaterally. Heart is enlarged. Diffuse interstitial opacities throughout the lungs compatible with mild edema superimposed on COPD. Postop changes in the left axilla.  IMPRESSION: Negative for pneumothorax following left thoracentesis.  Small effusions and edema persist compatible with CHF  Background COPD/emphysema   Electronically Signed   By: Jerilynn Mages.  Shick M.D.   On:  03/07/2015 14:40   Dg Chest 2 View  03/08/2015   CLINICAL DATA:  Shortness of breath for 3 weeks  EXAM: CHEST  2 VIEW  COMPARISON:  March 07, 2015  FINDINGS: There are persistent small pleural effusions, slightly larger on the left than on the right. There is mild interstitial edema superimposed on more chronic scarring. Heart is enlarged. There is slight pulmonary venous hypertension. There is postoperative change on the left with surgical clips present. No adenopathy appreciable.  IMPRESSION: Findings felt to represent a degree of congestive heart failure superimposed on chronic underlying interstitial disease. No appreciable airspace consolidation. No adenopathy.   Electronically Signed   By: Lowella Grip III M.D.   On: 03/08/2015 19:09   Dg Chest 2 View  03/05/2015   CLINICAL DATA:  81 year old female with intermittent chest pain  EXAM: CHEST  2 VIEW  COMPARISON:  Radiograph dated 02/22/2015  FINDINGS: Two views of the chest demonstrate emphysematous changes of the lungs. There is opacification of the left lower lobe likely combination of pleural effusion with associated atelectasis of the adjacent lung or pneumonia. A small right pleural effusion may be present. Stable cardiomegaly. No pneumothorax. Left chest wall surgical clips.  IMPRESSION: Bilateral pleural effusions, left greater right right with left lower lobe atelectasis/pneumonia. Clinical correlation and follow-up recommended.   Electronically Signed   By: Anner Crete M.D.   On: 03/05/2015 17:24   Dg Chest 2 View  02/22/2015   CLINICAL DATA:  Acute on  chronic congestive heart failure. COPD. Myelodysplastic syndrome.  EXAM: CHEST  2 VIEW  COMPARISON:  02/18/2015  FINDINGS: Moderate cardiomegaly stable. Increased diffuse interstitial prominence is suspicious for mild interstitial edema. New small bilateral pleural effusions also seen. Pulmonary hyperinflation again demonstrated, consistent with COPD. Surgical clips again seen in the  left axilla.  IMPRESSION: Increased diffuse interstitial infiltrates/edema and small bilateral pleural effusions, suspicious for congestive heart failure.  Stable cardiomegaly and COPD.   Electronically Signed   By: Earle Gell M.D.   On: 02/22/2015 14:47   Dg Chest 2 View  02/18/2015   CLINICAL DATA:  Fever  EXAM: CHEST  2 VIEW  COMPARISON:  09/15/2014  FINDINGS: Chronic cardiomegaly. Stable mild aortic tortuosity when accounting for rotation. Chronic interstitial coarsening and hyperinflation, bronchitic and emphysematous disease based on 12/19/2013 chest CT. No superimposed consolidation. Small bilateral pleural effusions are present. Status post left breast and axillary surgery.  IMPRESSION: COPD and trace bilateral pleural effusion. No pneumonia to explain the history of fever.   Electronically Signed   By: Monte Fantasia M.D.   On: 02/18/2015 03:23   Ct Head Wo Contrast  02/18/2015   CLINICAL DATA:  Blurred vision, generalized weakness.  EXAM: CT HEAD WITHOUT CONTRAST  TECHNIQUE: Contiguous axial images were obtained from the base of the skull through the vertex without intravenous contrast.  COMPARISON:  CT scan of September 15, 2014.  FINDINGS: Bony calvarium appears intact. Mild diffuse cortical atrophy is noted. Mild chronic ischemic white matter disease is noted. Old lacunar infarctions are noted in the right basal ganglia which are unchanged compared to prior exam. No mass effect or midline shift is noted. Ventricular size is within normal limits. There is no evidence of mass lesion, hemorrhage or acute infarction.  IMPRESSION: Mild diffuse cortical atrophy. Mild chronic ischemic white matter disease. No acute intracranial abnormality seen.   Electronically Signed   By: Marijo Conception, M.D.   On: 02/18/2015 08:45   Ct Angio Chest Pe W/cm &/or Wo Cm  03/06/2015   CLINICAL DATA:  81 year old with 2 week history of shortness of breath associated with mid chest pain. Personal history of left breast  cancer.  EXAM: CT ANGIOGRAPHY CHEST WITH CONTRAST  TECHNIQUE: Multidetector CT imaging of the chest was performed using the standard protocol during bolus administration of intravenous contrast. Multiplanar CT image reconstructions and MIPs were obtained to evaluate the vascular anatomy.  CONTRAST:  155m OMNIPAQUE IOHEXOL 350 MG/ML IV.  COMPARISON:  CT chest 12/19/2013.  FINDINGS: Contrast opacification of the pulmonary arteries is fair at best. Respiratory motion blurred images of the lung bases. Overall, the study is less than optimal.  No filling defects within either main pulmonary artery or the air central branches in either lung. Heart enlarged with evidence of left ventricular hypertrophy. Severe 3 vessel coronary atherosclerosis. Prominent epicardial fat. No pericardial effusion. Moderate atherosclerosis involving the thoracic and upper abdominal aorta without aneurysm or dissection.  Emphysematous changes throughout both lungs. Mild diffuse interstitial pulmonary edema. Large left pleural effusion and moderate sized right pleural effusion. Associated dense passive atelectasis in the lower lobes, left greater than right. Fluid in the major fissure on the right. Central airways patent with no significant bronchial wall thickening.  No significant mediastinal, hilar or axillary lymphadenopathy. Visualized thyroid gland unremarkable.  Visualized upper abdomen unremarkable for the early arterial phase of enhancement. Bone window images demonstrate mid and lower thoracic spondylosis, mild osseous demineralization and exaggeration of the usual thoracic kyphosis.  Review of the MIP images confirms the above findings.  IMPRESSION: 1. No evidence of central pulmonary embolism. 2. Mild CHF with cardiomegaly and mild diffuse interstitial pulmonary edema. 3. Large left pleural effusion and moderate sized right pleural effusion. Associated dense passive atelectasis in the lower lobes, left greater than right.    Electronically Signed   By: Evangeline Dakin M.D.   On: 03/06/2015 22:57   US Thoracentesis Asp Pleural Space W/img Guide  03/07/2015   INDICATION: Symptomatic left sided pleural effusion  EXAM: US THORACENTESIS ASP PLEURAL SPACE W/IMG GUIDE  COMPARISON:  CT Chest 03/06/15.  MEDICATIONS: None  COMPLICATIONS: None immediate  TECHNIQUE: Informed written consent was obtained from the patient's daughter after a discussion of the risks, benefits and alternatives to treatment. A timeout was performed prior to the initiation of the procedure.  Initial ultrasound scanning demonstrates a left pleural effusion. The lower chest was prepped and draped in the usual sterile fashion. 1% lidocaine with epinephrine was used for local anesthesia.  Under direct ultrasound guidance, a 19 gauge, 7-cm, Yueh catheter was introduced. An ultrasound image was saved for documentation purposes. The thoracentesis was performed. The catheter was removed and a dressing was applied. The patient tolerated the procedure well without immediate post procedural complication. The patient was escorted to have an upright chest radiograph.  FINDINGS: A total of approximately 750 ml of serous fluid was removed. Requested samples were sent to the laboratory.  IMPRESSION: Successful ultrasound-guided left sided thoracentesis yielding 750 ml of pleural fluid.  Read By:  [name] PA-C   Electronically Signed   By: Aletta Edouard M.D.   On: 03/07/2015 14:05    Microbiology: Recent Results (from the past 240 hour(s))  MRSA PCR Screening     Status: None   Collection Time: 03/05/15  8:42 PM  Result Value Ref Range Status   MRSA by PCR NEGATIVE NEGATIVE Final    Comment:        The GeneXpert MRSA Assay (FDA approved for NASAL specimens only), is one component of a comprehensive MRSA colonization surveillance program. It is not intended to diagnose MRSA infection nor to guide or monitor treatment for MRSA infections.   Culture, blood  (routine x 2) Call MD if unable to obtain prior to antibiotics being given     Status: None   Collection Time: 03/05/15 10:30 PM  Result Value Ref Range Status   Specimen Description BLOOD RIGHT ARM  Final   Special Requests   Final    BOTTLES DRAWN AEROBIC AND ANAEROBIC 10CC BLUE 8CC RED   Culture NO GROWTH 5 DAYS  Final   Report Status 03/10/2015 FINAL  Final  Culture, blood (routine x 2) Call MD if unable to obtain prior to antibiotics being given     Status: None   Collection Time: 03/05/15 10:40 PM  Result Value Ref Range Status   Specimen Description BLOOD RIGHT ARM  Final   Special Requests   Final    BOTTLES DRAWN AEROBIC AND ANAEROBIC 10CC BLUE [place] RED   Culture NO GROWTH 5 DAYS  Final   Report Status 03/10/2015 FINAL  Final  Culture, body fluid-bottle     Status: None   Collection Time: 03/07/15  2:16 PM  Result Value Ref Range Status   Specimen Description FLUID LEFT PLEURAL  Final   Special Requests   Final    BOTTLES DRAWN AEROBIC AND ANAEROBIC 10MLS LEFT CHEST   Culture NO GROWTH 5 DAYS  Final  Report Status 03/12/2015 FINAL  Final  Gram stain     Status: None   Collection Time: 03/07/15  2:16 PM  Result Value Ref Range Status   Specimen Description FLUID LEFT PLEURAL  Final   Special Requests BAA 10MLS LEFT CHEST  Final   Gram Stain RARE POLYMORPHONUCLEAR NO ORGANISMS SEEN   Final   Report Status 03/09/2015 FINAL  Final     Labs: Basic Metabolic Panel:  Recent Labs Lab 03/07/15 0535 03/08/15 0529 03/09/15 0608 03/10/15 0458 03/12/15 0807  NA 133* 133* 132* 135 136  K 3.8 3.3* 4.1 4.1 3.4*  CL 96* 95* 97* 96* 95*  CO2 '27 28 28 30 ' 34*  GLUCOSE 104* 147* 87 90 97  BUN 15 22* 21* 21* 30*  CREATININE 0.52 0.58 0.55 0.41* 0.55  CALCIUM 8.1* 8.0* 7.9* 7.9* 7.9*   Liver Function Tests:  Recent Labs Lab 03/06/15 1336  AST 24  ALT 17  ALKPHOS 61  BILITOT 2.4*  PROT 6.0*  ALBUMIN 2.1*   No results for input(s): LIPASE, AMYLASE in the last 168  hours. No results for input(s): AMMONIA in the last 168 hours. CBC:  Recent Labs Lab 03/06/15 1336 03/07/15 0535 03/08/15 0529 03/09/15 0608 03/10/15 0458 03/12/15 0807  WBC 18.5* 22.9* 21.1* 17.2* 14.9* 13.1*  NEUTROABS 12.0*  --   --   --   --   --   HGB 10.8* 10.3* 10.0* 9.7* 8.7* 7.8*  HCT 31.6* 30.8* 29.8* 28.9* 26.5* 23.9*  MCV 88.5 88.8 90.6 90.0 93.0 93.7  PLT 26* 28* 23* 21* 27* 20*   Cardiac Enzymes:  Recent Labs Lab 03/06/15 1336 03/06/15 1933 03/06/15 2344 03/07/15 0535  TROPONINI 0.13* <0.03 <0.03 0.03   BNP: BNP (last 3 results)  Recent Labs  03/05/15 1630  BNP 413.2*    ProBNP (last 3 results) No results for input(s): PROBNP in the last 8760 hours.  CBG: No results for input(s): GLUCAP in the last 168 hours.     SignedCristal Ford  [facility] 03/13/2015, 11:16 AM

## 2015-03-16 ENCOUNTER — Telehealth: Payer: Self-pay | Admitting: *Deleted

## 2015-03-16 ENCOUNTER — Other Ambulatory Visit: Payer: Medicare Other

## 2015-03-16 ENCOUNTER — Ambulatory Visit: Payer: Medicare Other | Admitting: Hematology and Oncology

## 2015-03-17 ENCOUNTER — Other Ambulatory Visit (HOSPITAL_COMMUNITY): Payer: Medicare Other

## 2015-03-17 ENCOUNTER — Ambulatory Visit (HOSPITAL_COMMUNITY): Payer: Medicare Other

## 2015-04-16 NOTE — Telephone Encounter (Signed)
VM from [facility] in [facility] to notify Dr. Alvy Bimler pt passed away this morning.  Their medical director will sign the death certificate.  Medical Records notified.

## 2015-04-16 DEATH — deceased
